# Patient Record
Sex: Male | Born: 1980 | Race: Black or African American | Hispanic: No | Marital: Single | Smoking: Current every day smoker
Health system: Southern US, Community
[De-identification: ages and names within clinical notes are randomized; demographics above are authoritative.]

## PROBLEM LIST (undated history)

## (undated) DIAGNOSIS — F250 Schizoaffective disorder, bipolar type: Secondary | ICD-10-CM

## (undated) DIAGNOSIS — F1210 Cannabis abuse, uncomplicated: Secondary | ICD-10-CM

## (undated) DIAGNOSIS — F200 Paranoid schizophrenia: Secondary | ICD-10-CM

## (undated) DIAGNOSIS — F23 Brief psychotic disorder: Secondary | ICD-10-CM

## (undated) DIAGNOSIS — F602 Antisocial personality disorder: Secondary | ICD-10-CM

---

## 2016-01-31 ENCOUNTER — Emergency Department
Admission: EM | Admit: 2016-01-31 | Discharge: 2016-01-31 | Disposition: A | Discharge status: Home / Self Care | Payer: Medicare Other | Attending: Emergency Medicine | Admitting: Emergency Medicine

## 2016-01-31 ENCOUNTER — Encounter: Payer: Self-pay | Admitting: Emergency Medicine

## 2016-01-31 DIAGNOSIS — Z91199 Patient's noncompliance with other medical treatment and regimen due to unspecified reason: Secondary | ICD-10-CM

## 2016-01-31 DIAGNOSIS — Z9119 Patient's noncompliance with other medical treatment and regimen: Secondary | ICD-10-CM

## 2016-01-31 DIAGNOSIS — Z046 Encounter for general psychiatric examination, requested by authority: Secondary | ICD-10-CM | POA: Diagnosis present

## 2016-01-31 DIAGNOSIS — F200 Paranoid schizophrenia: Secondary | ICD-10-CM

## 2016-01-31 DIAGNOSIS — F209 Schizophrenia, unspecified: Secondary | ICD-10-CM | POA: Diagnosis not present

## 2016-01-31 DIAGNOSIS — F17210 Nicotine dependence, cigarettes, uncomplicated: Secondary | ICD-10-CM | POA: Diagnosis not present

## 2016-01-31 HISTORY — DX: Cannabis abuse, uncomplicated: F12.10

## 2016-01-31 HISTORY — DX: Paranoid schizophrenia: F20.0

## 2016-01-31 HISTORY — DX: Brief psychotic disorder: F23

## 2016-01-31 LAB — COMPREHENSIVE METABOLIC PANEL
ALK PHOS: 90 U/L (ref 38–126)
ALT: 20 U/L (ref 17–63)
ANION GAP: 9 (ref 5–15)
AST: 26 U/L (ref 15–41)
Albumin: 4.5 g/dL (ref 3.5–5.0)
BILIRUBIN TOTAL: 1.3 mg/dL — AB (ref 0.3–1.2)
BUN: 14 mg/dL (ref 6–20)
CALCIUM: 9.6 mg/dL (ref 8.9–10.3)
CO2: 26 mmol/L (ref 22–32)
Chloride: 102 mmol/L (ref 101–111)
Creatinine, Ser: 1.04 mg/dL (ref 0.61–1.24)
GFR calc Af Amer: >60 mL/min (ref >60)
GLUCOSE: 93 mg/dL (ref 65–99)
POTASSIUM: 4 mmol/L (ref 3.5–5.1)
Sodium: 137 mmol/L (ref 135–145)
TOTAL PROTEIN: 7.7 g/dL (ref 6.5–8.1)

## 2016-01-31 LAB — CBC
HEMATOCRIT: 42.5 % (ref 40.0–52.0)
Hemoglobin: 14.4 g/dL (ref 13.0–18.0)
MCH: 30 pg (ref 26.0–34.0)
MCHC: 34 g/dL (ref 32.0–36.0)
MCV: 88.4 fL (ref 80.0–100.0)
PLATELETS: 171 10*3/uL (ref 150–440)
RBC: 4.8 MIL/uL (ref 4.40–5.90)
RDW: 14.2 % (ref 11.5–14.5)
WBC: 3.9 10*3/uL (ref 3.8–10.6)

## 2016-01-31 LAB — ETHANOL

## 2016-01-31 LAB — SALICYLATE LEVEL: Salicylate Lvl: <4 mg/dL (ref 2.8–30.0)

## 2016-01-31 LAB — ACETAMINOPHEN LEVEL: Acetaminophen (Tylenol), Serum: <10 ug/mL — ABNORMAL LOW (ref 10–30)

## 2016-01-31 NOTE — ED Notes (Signed)
Pt awake in hall bed, awaiting group home, at 1930, per Florentina AddisonKatie, [place]

## 2016-01-31 NOTE — ED Notes (Signed)
Guardian for pt called, insisting on pt being held if not then meds to be prescribed to pt to take home, Katie, RN, explained in redundant detail that we have no committment cause, pt is behavioral in nature, and psychiatrist doesn't typically send pts home with med prescriptions  Dictated but not read by Florentina AddisonKatie, RN

## 2016-01-31 NOTE — Consult Note (Signed)
[facility] Face-to-Face Psychiatry Consult   Reason for Consult:  Consult for this 35 year old man brought in by petition Referring Physician:  Lovena Le Patient Identification: David Davila. MRN:  [id number] Principal Diagnosis: Paranoid schizophrenia ([place]) Diagnosis:   Patient Active Problem List   Diagnosis Date Noted  . Paranoid schizophrenia ([place]) [F20.0] 01/31/2016  . Noncompliance [Z91.19] 01/31/2016    Total Time spent with patient: 1 hour  Subjective:   David Ozburn. is a 35 y.o. male patient admitted with "I just got out of [facility]".  HPI:  Patient interviewed. Chart reviewed. Labs and vitals reviewed. Case discussed with ER attending and TTS. 35 year old man with a history of schizophrenia. He was just discharged from [facility] yesterday and was placed in a group home in [place]. Patient reports that when he went to this group home they were not able to meet his needs for a vegan diet. Because of this he felt that he could not stay there and so he walked away. They called 911. Patient denies that he's having any auditory or visual hallucinations. Denies having any suicidal thoughts or wish to die. Denies any thoughts of aggression or violence to anyone else. None of that is alleged in the paperwork either. Patient says that he will gladly eat food as long as it is a vegan food. He admits that in the past he has had a delusion that caused him to starve himself completely but says he is no longer acting under that delusion. He is taking Invega long-acting injectable antipsychotic. Patient denies that he has abused any drugs or alcohol since leaving the hospital. He does however have a legal guardian.  Social history: Patient is from another [place]. It sounds like over the last 12 years he has been almost constantly hospitalized. Has had extended hospitalizations at the state hospital. Does have a legal guardian appointed.  Medical history: Currently  denies any active medical problems.  Substance abuse history: Patient admits to a history of cocaine and marijuana abuse but says he hasn't been abusing any since before his last hospitalization.  Past Psychiatric History: Patient apparently became psychotic in his early 13s and developed a persistent delusion that all food was poisonous. From what he says it sounds like he's had multiple extended hospitalizations because of this. Most recently [facility]. He's had several antipsychotics in the past. Now on long-acting injectable. Patient denies he's ever intentionally tried to kill himself or hurt anyone else. Additionally he has a history of substance abuse with cocaine and marijuana being prominent .  Risk to Self: Is patient at risk for suicide?: No Risk to Others:   Prior Inpatient Therapy:   Prior Outpatient Therapy:    Past Medical History:  Past Medical History  Diagnosis Date  . Paranoid schizophrenia ([place])   . Acute psychosis   . Cannabis abuse     Past   History reviewed. No pertinent past surgical history. Family History: History reviewed. No pertinent family history. Family Psychiatric  History: Patient reports that he does not know anything about his biological family of origin Social History:  History  Alcohol Use No     History  Drug Use No    Social History   Social History  . Marital Status: Single    Spouse Name: N/A  . Number of Children: N/A  . Years of Education: N/A   Social History Main Topics  . Smoking status: Current Every Day  Smoker -- 1.00 packs/day    Types: Cigarettes  . Smokeless tobacco: Never Used  . Alcohol Use: No  . Drug Use: No  . Sexual Activity: Not Asked   Other Topics Concern  . None   Social History Narrative  . None   Additional Social History:    Allergies:  No Known Allergies  Labs:  Results for orders placed or performed during the hospital encounter of 01/31/16 (from the past 48 hour(s))   Comprehensive metabolic panel     Status: Abnormal   Collection Time: 01/31/16  1:30 PM  Result Value Ref Range   Sodium 137 135 - 145 mmol/L   Potassium 4.0 3.5 - 5.1 mmol/L   Chloride 102 101 - 111 mmol/L   CO2 26 22 - 32 mmol/L   Glucose, Bld 93 65 - 99 mg/dL   BUN 14 6 - 20 mg/dL   Creatinine, Ser 1.04 0.61 - 1.24 mg/dL   Calcium 9.6 8.9 - 10.3 mg/dL   Total Protein 7.7 6.5 - 8.1 g/dL   Albumin 4.5 3.5 - 5.0 g/dL   AST 26 15 - 41 U/L   ALT 20 17 - 63 U/L   Alkaline Phosphatase 90 38 - 126 U/L   Total Bilirubin 1.3 (H) 0.3 - 1.2 mg/dL   GFR calc non Af Amer >60 >60 mL/min   GFR calc Af Amer >60 >60 mL/min    Comment: (NOTE) The eGFR has been calculated using the CKD EPI equation. This calculation has not been validated in all clinical situations. eGFR's persistently <60 mL/min signify possible Chronic Kidney Disease.    Anion gap 9 5 - 15  Ethanol     Status: None   Collection Time: 01/31/16  1:30 PM  Result Value Ref Range   Alcohol, Ethyl (B) <5 <5 mg/dL    Comment:        LOWEST DETECTABLE LIMIT FOR SERUM ALCOHOL IS 5 mg/dL FOR MEDICAL PURPOSES ONLY   Salicylate level     Status: None   Collection Time: 01/31/16  1:30 PM  Result Value Ref Range   Salicylate Lvl <5.7 2.8 - 30.0 mg/dL  Acetaminophen level     Status: Abnormal   Collection Time: 01/31/16  1:30 PM  Result Value Ref Range   Acetaminophen (Tylenol), Serum <10 (L) 10 - 30 ug/mL    Comment:        THERAPEUTIC CONCENTRATIONS VARY SIGNIFICANTLY. A RANGE OF 10-30 ug/mL MAY BE AN EFFECTIVE CONCENTRATION FOR MANY PATIENTS. HOWEVER, SOME ARE BEST TREATED AT CONCENTRATIONS OUTSIDE THIS RANGE. ACETAMINOPHEN CONCENTRATIONS >150 ug/mL AT 4 HOURS AFTER INGESTION AND >50 ug/mL AT 12 HOURS AFTER INGESTION ARE OFTEN ASSOCIATED WITH TOXIC REACTIONS.   cbc     Status: None   Collection Time: 01/31/16  1:30 PM  Result Value Ref Range   WBC 3.9 3.8 - 10.6 K/uL   RBC 4.80 4.40 - 5.90 MIL/uL   Hemoglobin  14.4 13.0 - 18.0 g/dL   HCT 42.5 40.0 - 52.0 %   MCV 88.4 80.0 - 100.0 fL   MCH 30.0 26.0 - 34.0 pg   MCHC 34.0 32.0 - 36.0 g/dL   RDW 14.2 11.5 - 14.5 %   Platelets 171 150 - 440 K/uL    No current facility-administered medications for this encounter.   No current outpatient prescriptions on file.    Musculoskeletal: Strength & Muscle Tone: within normal limits Gait & Station: normal Patient leans: N/A  Psychiatric Specialty Exam: Physical Exam  Nursing note and vitals reviewed. Constitutional: He appears well-developed and well-nourished.  HENT:  Head: Normocephalic and atraumatic.  Eyes: Conjunctivae are normal. Pupils are equal, round, and reactive to light.  Neck: Normal range of motion.  Cardiovascular: Normal heart sounds.   Respiratory: Effort normal. No respiratory distress.  GI: Soft.  Musculoskeletal: Normal range of motion.  Neurological: He is alert.  Skin: Skin is warm and dry.  Psychiatric: He has a normal mood and affect. His behavior is normal. Judgment and thought content normal.    Review of Systems  Constitutional: Negative.   HENT: Negative.   Eyes: Negative.   Respiratory: Negative.   Cardiovascular: Negative.   Gastrointestinal: Negative.   Musculoskeletal: Negative.   Skin: Negative.   Neurological: Negative.   Psychiatric/Behavioral: Negative for depression, suicidal ideas, hallucinations, memory loss and substance abuse. The patient is not nervous/anxious and does not have insomnia.     Blood pressure 99/61, pulse 87, temperature 98 F (36.7 C), temperature source Oral, resp. rate 18, height 5' 9" (1.753 m), weight 63.504 kg (140 lb), SpO2 97 %.Body mass index is 20.67 kg/(m^2).  General Appearance: Fairly Groomed  Eye Contact:  Good  Speech:  Clear and Coherent  Volume:  Normal  Mood:  Euthymic  Affect:  Appropriate  Thought Process:  Goal Directed  Orientation:  Full (Time, Place, and Person)  Thought Content:  Logical  Suicidal  Thoughts:  No  Homicidal Thoughts:  No  Memory:  Immediate;   Good Recent;   Fair Remote;   Fair  Judgement:  Fair  Insight:  Fair  Psychomotor Activity:  Normal  Concentration:  Concentration: Fair  Recall:  [employer] of Knowledge:  Fair  Language:  Fair  Akathisia:  No  Handed:  Right  AIMS (if indicated):     Assets:  Communication Skills Desire for Improvement Financial Resources/Insurance Housing Physical Health Social Support  ADL's:  Intact  Cognition:  WNL  Sleep:        Treatment Plan Summary: Plan 35 year old man with schizophrenia. Just recently discharged from the hospital and already walking away from his group home. Currently there is no evidence that he is actively psychotic or dangerous to himself or others. I explained to the patient that because he has a guardian that it is likely that if he continues to walk away from the place he is living they will continue to petition him over and over again and so he should think about finding a different solution. Case reviewed with the ER doctor. IVC discontinued. No medications or prescriptions required.  Disposition: Patient does not meet criteria for psychiatric inpatient admission. Supportive therapy provided about ongoing stressors.  Alethia Berthold, MD 01/31/2016 3:25 PM

## 2016-01-31 NOTE — ED Provider Notes (Addendum)
[facility] Emergency Department Provider Note  ___________________________________________  Time seen: Approximately 1:50 PM  I have reviewed the triage vital signs and the nursing notes.   HISTORY  Chief Complaint Beh Med Eval     HPI David Daviesaul Arauz Jr. is a 35 y.o. male who has history of schizophrenia, but states that he has been taken his InVega injections as he supposed to. Patient states that he has not been psychotic at all. Patient states that he told the group home that he requires a Vegan diet and they have not been feeding him a vegan diet. Patient states that he decided to leave the facility go somewhere where he could have a diet that he can tolerate. Patient says they brought him back and he asked them to make him a vegan diet and again and they gave him beans and greens the room with me and butter and shoulder. Patient states that he cannot tolerate the diet and he again left the facility. Patient was picked up by the police per IVC from the facility.   Past Medical History  Diagnosis Date  . Paranoid schizophrenia ([facility])   . Acute psychosis   . Cannabis abuse     Past    Patient Active Problem List   Diagnosis Date Noted  . Paranoid schizophrenia ([facility]) 01/31/2016  . Noncompliance 01/31/2016    History reviewed. No pertinent past surgical history.  No current outpatient prescriptions on file.  Allergies Review of patient's allergies indicates no known allergies.  History reviewed. No pertinent family history.  Social History Social History  Substance Use Topics  . Smoking status: Current Every Day Smoker -- 1.00 packs/day    Types: Cigarettes  . Smokeless tobacco: Never Used  . Alcohol Use: No    Review of Systems Constitutional: No fever/chills Eyes: No visual changes. ENT: No sore throat. Cardiovascular: Denies chest pain. Respiratory: Denies shortness of breath. Gastrointestinal: No abdominal pain.  No nausea, no  vomiting.  No diarrhea.  No constipation. Genitourinary: Negative for dysuria. Musculoskeletal: Negative for back pain. Skin: Negative for rash. Neurological: Negative for headaches, focal weakness or numbness.  10-point ROS otherwise negative.  ____________________________________________   PHYSICAL EXAM:  VITAL SIGNS: ED Triage Vitals  Enc Vitals Group     BP 01/31/16 1322 99/61 mmHg     Pulse Rate 01/31/16 1322 87     Resp 01/31/16 1322 18     Temp 01/31/16 1322 98 F (36.7 C)     Temp Source 01/31/16 1322 Oral     SpO2 01/31/16 1322 97 %     Weight 01/31/16 1322 140 lb (63.504 kg)     Height 01/31/16 1322 5\' 9"  (1.753 m)     Head Cir --      Peak Flow --      Pain Score --      Pain Loc --      Pain Edu? --      Excl. in GC? --     Constitutional: Alert and oriented. Well appearing and in no acute distress. Eyes: Conjunctivae are normal. PERRL. EOMI. Head: Atraumatic. Nose: No congestion/rhinnorhea. Mouth/Throat: Mucous membranes are moist.  Oropharynx non-erythematous. Neck: No stridor.   Cardiovascular: Normal rate, regular rhythm. Grossly normal heart sounds.  Good peripheral circulation. Respiratory: Normal respiratory effort.  No retractions. Lungs CTAB. Gastrointestinal: Soft and nontender. No distention. No abdominal bruits. No CVA tenderness. Musculoskeletal: No lower extremity tenderness nor edema.  No joint effusions. Neurologic:  Normal  speech and language. No gross focal neurologic deficits are appreciated. No gait instability. Skin:  Skin is warm, dry and intact. No rash noted. Psychiatric: Mood and affect are normal. Speech and behavior are normal.  ____________________________________________   LABS (all labs ordered are listed, but only abnormal results are displayed)  Labs Reviewed  COMPREHENSIVE METABOLIC PANEL - Abnormal; Notable for the following:    Total Bilirubin 1.3 (*)    All other components within normal limits  ACETAMINOPHEN  LEVEL - Abnormal; Notable for the following:    Acetaminophen (Tylenol), Serum <10 (*)    All other components within normal limits  ETHANOL  SALICYLATE LEVEL  CBC  URINE DRUG SCREEN, QUALITATIVE ([facility] ONLY)   ____________________________________________  EKG none  ____________________________________________  RADIOLOGY  none ____________________________________________   PROCEDURES  Procedure(s) performed: None  Critical Care performed: No  ____________________________________________   INITIAL IMPRESSION / ASSESSMENT AND PLAN / ED COURSE  Pertinent labs & imaging results that were available during my care of the patient were reviewed by me and considered in my medical decision making (see chart for details). 3:28 PM Patient is cooperative in the ER. Patient will have consult from TTS and psychiatry for further evaluation secondary to his IVC. Patient will be signed out to Dr. Derrill Kay at 4 PM.  3:28 PM Dr. Mat Carne PACS did not feel this patient required admission, and reversed his IVC papers. Patient is to follow-up with his psychiatrist as an outpatient. ____________________________________________   FINAL CLINICAL IMPRESSION(S) / ED DIAGNOSES  Final diagnoses:  Schizophrenia, unspecified type (HCC)      NEW MEDICATIONS STARTED DURING THIS VISIT:  New Prescriptions   No medications on file     Note:  This document was prepared using Dragon voice recognition software and may include unintentional dictation errors.    Leona Carry, MD 01/31/16 1414  Leona Carry, MD 01/31/16 [phone]

## 2016-01-31 NOTE — ED Notes (Addendum)
POC discussed with pt, pt resolved to go back to group home "I gotta make it work", discussed with first Charity fundraiserN, Insurance claims handlerLisa and charge RN, [employer]  [employer] called

## 2016-01-31 NOTE — ED Notes (Signed)
At 2132 this RN called David Davila and confirmed that pt arrived safely by cab

## 2016-01-31 NOTE — ED Notes (Signed)
Called group home x2; staff member unable to get patient due to being the only staff member. Called Owner x2 and left messages for return calls: David Davila: [phone]

## 2016-01-31 NOTE — ED Notes (Signed)
At 2132 this RN contacted Brock BadLuwanda Ray at group home and confirmed that pt had arrived safely by cab

## 2016-01-31 NOTE — ED Notes (Signed)
Attempted to call group home owner 3 more times; messages left.

## 2016-01-31 NOTE — ED Notes (Signed)
Brock BadLuwanda Ray, [phone], group home owner, asked this RN to call taxi for COD pt to [address], [place].

## 2016-01-31 NOTE — ED Notes (Signed)
Pt [facility] and [facility]. Pt states he decided yesterday he did not to stay there. Pt states he told the caregivers that he is a vegan and was a hindu and then at dinner he was given an orange and peanut butter crackers due to his dietary restrictions. Pt states he got upset and left and [facility] was called and IVC papers taken out on patient. IVC papers state patient walks off from facility.

## 2016-03-10 ENCOUNTER — Encounter (HOSPITAL_COMMUNITY): Payer: Self-pay

## 2016-03-10 ENCOUNTER — Emergency Department (HOSPITAL_COMMUNITY)
Admission: EM | Admit: 2016-03-10 | Discharge: 2016-03-11 | Disposition: A | Discharge status: Home / Self Care | Payer: Medicare Other | Attending: Emergency Medicine | Admitting: Emergency Medicine

## 2016-03-10 DIAGNOSIS — F17210 Nicotine dependence, cigarettes, uncomplicated: Secondary | ICD-10-CM | POA: Insufficient documentation

## 2016-03-10 DIAGNOSIS — Z046 Encounter for general psychiatric examination, requested by authority: Secondary | ICD-10-CM | POA: Diagnosis present

## 2016-03-10 DIAGNOSIS — F22 Delusional disorders: Secondary | ICD-10-CM | POA: Diagnosis not present

## 2016-03-10 DIAGNOSIS — Z79899 Other long term (current) drug therapy: Secondary | ICD-10-CM | POA: Insufficient documentation

## 2016-03-10 LAB — RAPID URINE DRUG SCREEN, HOSP PERFORMED
AMPHETAMINES: NOT DETECTED
BENZODIAZEPINES: POSITIVE — AB
Barbiturates: NOT DETECTED
COCAINE: POSITIVE — AB
Opiates: NOT DETECTED
TETRAHYDROCANNABINOL: POSITIVE — AB

## 2016-03-10 LAB — COMPREHENSIVE METABOLIC PANEL
ALBUMIN: 4 g/dL (ref 3.5–5.0)
ALK PHOS: 70 U/L (ref 38–126)
ALT: 15 U/L — ABNORMAL LOW (ref 17–63)
ANION GAP: 3 — AB (ref 5–15)
AST: 20 U/L (ref 15–41)
BUN: 12 mg/dL (ref 6–20)
CHLORIDE: 108 mmol/L (ref 101–111)
CO2: 26 mmol/L (ref 22–32)
Calcium: 9.2 mg/dL (ref 8.9–10.3)
Creatinine, Ser: 1.07 mg/dL (ref 0.61–1.24)
GFR calc non Af Amer: >60 mL/min (ref >60)
GLUCOSE: 79 mg/dL (ref 65–99)
POTASSIUM: 3.8 mmol/L (ref 3.5–5.1)
SODIUM: 137 mmol/L (ref 135–145)
Total Bilirubin: 1.3 mg/dL — ABNORMAL HIGH (ref 0.3–1.2)
Total Protein: 6.8 g/dL (ref 6.5–8.1)

## 2016-03-10 LAB — SALICYLATE LEVEL

## 2016-03-10 LAB — CBC WITH DIFFERENTIAL/PLATELET
BASOS ABS: 0 10*3/uL (ref 0.0–0.1)
BASOS PCT: 0 %
Eosinophils Absolute: 0.1 10*3/uL (ref 0.0–0.7)
Eosinophils Relative: 1 %
HEMATOCRIT: 36.8 % — AB (ref 39.0–52.0)
Hemoglobin: 12.5 g/dL — ABNORMAL LOW (ref 13.0–17.0)
LYMPHS PCT: 50 %
Lymphs Abs: 2.4 10*3/uL (ref 0.7–4.0)
MCH: 30 pg (ref 26.0–34.0)
MCHC: 34 g/dL (ref 30.0–36.0)
MCV: 88.2 fL (ref 78.0–100.0)
MONO ABS: 0.5 10*3/uL (ref 0.1–1.0)
Monocytes Relative: 10 %
NEUTROS ABS: 1.9 10*3/uL (ref 1.7–7.7)
Neutrophils Relative %: 39 %
Platelets: 180 10*3/uL (ref 150–400)
RBC: 4.17 MIL/uL — AB (ref 4.22–5.81)
RDW: 13.4 % (ref 11.5–15.5)
WBC: 4.9 10*3/uL (ref 4.0–10.5)

## 2016-03-10 LAB — ETHANOL: Alcohol, Ethyl (B): <5 mg/dL (ref <5)

## 2016-03-10 LAB — ACETAMINOPHEN LEVEL

## 2016-03-10 MED ORDER — NICOTINE 21 MG/24HR TD PT24
21.0000 mg | MEDICATED_PATCH | Freq: Every day | TRANSDERMAL | Status: DC
Start: 2016-03-10 — End: 2016-03-11

## 2016-03-10 MED ORDER — ONDANSETRON HCL 4 MG PO TABS: 4.0000 mg | ORAL_TABLET | Freq: Three times a day (TID) | ORAL | Status: DC | PRN

## 2016-03-10 MED ORDER — ZOLPIDEM TARTRATE 5 MG PO TABS: 10.0000 mg | ORAL_TABLET | Freq: Every evening | ORAL | Status: DC | PRN

## 2016-03-10 MED ORDER — IBUPROFEN 400 MG PO TABS: 600.0000 mg | ORAL_TABLET | Freq: Three times a day (TID) | ORAL | Status: DC | PRN

## 2016-03-10 MED ORDER — ALUM & MAG HYDROXIDE-SIMETH 200-200-20 MG/5ML PO SUSP: 30.0000 mL | ORAL | Status: DC | PRN

## 2016-03-10 MED ORDER — LORAZEPAM 1 MG PO TABS: 1.0000 mg | ORAL_TABLET | Freq: Three times a day (TID) | ORAL | Status: DC | PRN

## 2016-03-10 MED ORDER — ACETAMINOPHEN 325 MG PO TABS: 650.0000 mg | ORAL_TABLET | ORAL | Status: DC | PRN

## 2016-03-10 NOTE — [facility] Counselor (Signed)
Attempted to contact Pt for TTS assessment.  Per hospital staff, Pt still refuses to open his eyes although the sitter did observe him shifting earlier.

## 2016-03-10 NOTE — ED Triage Notes (Signed)
Patient here VIA [facility].   Patient states "im choosing not to eat, I know that when I dont eat they will send me to the hospital, so I came up here before they can send me here"   Patient states he ate lunch today (corn, lettuce, and peaches. And has refused food for the rest of the day.  Patient A&OX4 at this time, calm and cooperative at this time.  Patient denies SI/HI and denies AVH.   Per [facility] patient eloped from group home today and has been walking, patient walked to sheriff's office and requested to come to hospital.

## 2016-03-10 NOTE — ED Notes (Addendum)
Pt's nurse arrived to transport pt back to [facility]. Upon arrival into room, pt refusing to go back to facility. Facility has agreed to accept him back. Dr. Hyacinth Meeker notified, as well as [facility]. Per [facility] they will get in touch with social work and notify us ASAP.

## 2016-03-10 NOTE — ED Notes (Signed)
Patient changed into purple scrubs and wanded by security. 

## 2016-03-10 NOTE — ED Notes (Signed)
Pt witnessed moving around on stretcher by sitter. Upon RN entering room pt refuses to move, open eyes or respond to RN. Respirations even and unlabored. Pt refusing to cooperate for TTS assessment at this time.

## 2016-03-10 NOTE — [facility] Counselor (Signed)
Assessed Pt via telepsych machine.  Left message for Archie Balboa, Pt's nurse at [facility] for collateral.  Her number -- [phone].  Left message asking her to contact me at [phone].

## 2016-03-10 NOTE — ED Notes (Signed)
Patient resting in bed at this time, eyes closed. Even rise and fall of chest. NAD at this time

## 2016-03-10 NOTE — ED Notes (Signed)
Per Dennard Nip at [facility], there is nothing their Child psychotherapist can do at this time. Due to pt refusing to go back to facility, it is now a social work case. Recommends pt stays until [facility] social work arrives in the morning. Consulting civil engineer notified.

## 2016-03-10 NOTE — ED Notes (Signed)
[facility] [phone]

## 2016-03-10 NOTE — ED Notes (Signed)
Pt given meal tray. Pt placed tray outside of room.

## 2016-03-10 NOTE — ED Notes (Signed)
Patient went to restroom, changed clothing and gave urine specimen at this time. Patient cooperative and very polite.

## 2016-03-10 NOTE — BH Assessment (Signed)
Tele Assessment Note   David Jesus. is a 35 y.o. male with a history of schizophrenia who presented voluntarily to [facility] after approaching a local sheriff's office and asking to be transported to the hospital.  Pt resides at [facility] (a group home).  Pt initially presented at 0425 on 7/30 but was unable to be roused for assessment.  The following history was collected from Pt and also from his group home nurse Burna Mortimer Philips -- [phone]).  Pt stated that he wanted to come into the hospital because he does not like his group home.  Pt stated "I was raised my whole life eating ... I'm not supposed to eat.  I don't need to eat.  Hunger is a disease."  When asked about eating, Pt stated that he does not want to eat, but he also acknowledged that he finally ate something at the hospital today.  His last meal before that was lunch on 03/09/16.  Pt stated that he spent three years in [place] starting around age 40.  He acknowledged a diagnosis of schizophrenia.  "They say I have a fixed delusion."    Pt tested positive for cocaine and marijuana, and when asked about it, Pt responded, "I use it when I'm bored."  Use started around age 59.  Pt described use as episodic, and then only with small amounts.    Ms. Venia Carbon reported that Pt has lived in the group home for about a month.  He eloped twice this past week and has been in trouble at the group home for bringing marijuana into the facility.  Per Ms. Philips, Pt was admitted to [facility] following his father's death -- "He couldn't accept it," and it was around this time that Pt developed his delusion around not eating.   He stayed at [facility] for three years.  Per Ms. Philips, Pt's guardian is [employer] [place], an organization that helps people with developmental delays.  (Note:  No history of I/DD provided to author; likewise, Pt was lucid and responsive and did not exhibit any obvious developmental delays).    Ms. Venia Carbon said that the group home  will be willing to take Pt back, but that the group home is also looking at discharge due to Pt's continued use of marijuana.  Consulted with L. Earlene Plater, NP, who recommended Pt's discharge from [facility]. He is to return to the group home and provider.  Diagnosis: Schizophrenia  Past Medical History:  Past Medical History:  Diagnosis Date  . Acute psychosis   . Cannabis abuse    Past  . Paranoid schizophrenia (HCC)     History reviewed. No pertinent surgical history.  Family History: History reviewed. No pertinent family history.  Social History:  reports that he has been smoking Cigarettes.  He has been smoking about 1.00 pack per day. He has never used smokeless tobacco. He reports that he uses drugs, including Cocaine and Marijuana. He reports that he does not drink alcohol.  Additional Social History:  Alcohol / Drug Use Pain Medications: See PTA Prescriptions: See PTA Over the Counter: See PTA History of alcohol / drug use?: Yes Substance #1 Name of Substance 1: Cocaine 1 - Age of First Use: [age] 1 - Amount (size/oz): "Just a little bit" 1 - Frequency: Episodic ("When I'm bored") 1 - Duration: Ongoing 1 - Last Use / Amount: 03/09/16 Substance #2 Name of Substance 2: Marijuana 2 - Age of First Use: [age] 2 - Amount (size/oz): "Not much" 2 -  Frequency: Episodic ("When I'm bored") 2 - Duration: Ongoing 2 - Last Use / Amount: 03/09/16  CIWA: CIWA-Ar BP: 114/59 Pulse Rate: 81 COWS:    PATIENT STRENGTHS: (choose at least two) Communication skills General fund of knowledge Physical Health  Allergies: No Known Allergies  Home Medications:  (Not in a hospital admission)  OB/GYN Status:  No LMP for male patient.  General Assessment Data Location of Assessment: [facility] ED TTS Assessment: In system Is this a Tele or Face-to-Face Assessment?: Tele Assessment Is this an Initial Assessment or a Re-assessment for this encounter?: Initial Assessment Marital status: Single Is  patient pregnant?: No Pregnancy Status: No Living Arrangements: Group Home [facility]; contact is [employer] [phone]) Can pt return to current living arrangement?: Yes Admission Status: Voluntary Is patient capable of signing voluntary admission?: Yes Referral Source: Self/Family/Friend Insurance type: Self-pay  Medical Screening Exam [facility] Walk-in ONLY) Medical Exam completed: Yes  Crisis Care Plan Living Arrangements: Group Home [facility]; contact is [place] [phone]) Legal Guardian: Other: ([employer] [place])  Education Status Is patient currently in school?: No  Risk to self with the past 6 months Suicidal Ideation: No Has patient been a risk to self within the past 6 months prior to admission? : No Suicidal Intent: No Has patient had any suicidal intent within the past 6 months prior to admission? : No Is patient at risk for suicide?: No Suicidal Plan?: No Has patient had any suicidal plan within the past 6 months prior to admission? : No Access to Means: No What has been your use of drugs/alcohol within the last 12 months?: Cocaine, Marijuana Previous Attempts/Gestures: No Intentional Self Injurious Behavior: None Family Suicide History: No Recent stressful life event(s): Legal Issues (Drug paraphenalia possession charge just dismissed) Persecutory voices/beliefs?: No Depression: No Substance abuse history and/or treatment for substance abuse?: Yes Suicide prevention information given to non-admitted patients: Not applicable  Risk to Others within the past 6 months Homicidal Ideation: No Does patient have any lifetime risk of violence toward others beyond the six months prior to admission? : No Thoughts of Harm to Others: No Current Homicidal Intent: No Current Homicidal Plan: No Access to Homicidal Means: No History of harm to others?: No Assessment of Violence: None Noted Does patient have access to weapons?: No Criminal Charges  Pending?: No Does patient have a court date: No Is patient on probation?: Unknown  Psychosis Hallucinations: None noted Delusions: Unspecified (Fixed delusion -- eating is bad and he does not need to eat)  Mental Status Report Appearance/Hygiene: In scrubs, Unremarkable Eye Contact: Good Motor Activity: Unremarkable Speech: Unremarkable, Logical/coherent Level of Consciousness: Alert Mood: Euthymic, Pleasant Affect: Appropriate to circumstance Anxiety Level: None Thought Processes: Coherent, Relevant Judgement: Impaired Orientation: Person, Place, Situation, Time Obsessive Compulsive Thoughts/Behaviors: None  Cognitive Functioning Concentration: Normal Memory: Recent Intact, Remote Intact IQ: Average (Note:  Pt's guardian is [employer] [place]) Insight: Fair (Pt has understanding that he has delusion) Impulse Control: Poor Appetite: Poor (See narrative) Sleep: No Change Vegetative Symptoms:  (Not eating -- see narrative)  ADLScreening [facility] Assessment Services) Patient's cognitive ability adequate to safely complete daily activities?: Yes Patient able to express need for assistance with ADLs?: Yes Independently performs ADLs?: Yes (appropriate for developmental age)  Prior Inpatient Therapy Prior Inpatient Therapy: Yes Prior Therapy Facilty/Provider(s): [facility] Reason for Treatment: Delusion/Schizophrenia  Prior Outpatient Therapy Does patient have an ACCT team?: No Does patient have Intensive In-House Services?  : No Does patient have [employer]  services? : No Does patient have P4CC services?: No  ADL Screening (condition at time of admission) Patient's cognitive ability adequate to safely complete daily activities?: Yes Is the patient deaf or have difficulty hearing?: No Does the patient have difficulty seeing, even when wearing glasses/contacts?: No Does the patient have difficulty concentrating, remembering, or making decisions?: No Patient able to express need for  assistance with ADLs?: Yes Does the patient have difficulty dressing or bathing?: No Independently performs ADLs?: Yes (appropriate for developmental age) Does the patient have difficulty walking or climbing stairs?: No Weakness of Legs: None Weakness of Arms/Hands: None  Home Assistive Devices/Equipment Home Assistive Devices/Equipment: None  Therapy Consults (therapy consults require a physician order) PT Evaluation Needed: No OT Evalulation Needed: No SLP Evaluation Needed: No Abuse/Neglect Assessment (Assessment to be complete while patient is alone) Physical Abuse: Denies Verbal Abuse: Denies Sexual Abuse: Denies Exploitation of patient/patient's resources: Denies Self-Neglect: Denies Values / Beliefs Cultural Requests During Hospitalization: None Spiritual Requests During Hospitalization: None Consults Spiritual Care Consult Needed: No Social Work Consult Needed: No Merchant navy officer (For Healthcare) Does patient have an advance directive?: No Would patient like information on creating an advanced directive?: No - patient declined information    Additional Information 1:1 In Past 12 Months?: No CIRT Risk: No Elopement Risk: Yes Does patient have medical clearance?: Yes     Disposition:  Disposition Initial Assessment Completed for this Encounter: Yes Disposition of Patient: Other dispositions Other disposition(s): To current provider, Other (Comment) (Pt to be discharged, returned to group home)  Dorris Fetch Casper Pagliuca 03/10/2016 11:58 AM

## 2016-03-10 NOTE — BH Assessment (Signed)
Attempted tele-assessment. RN attempted to rouse Pt without success. TTS will assess Pt when he is awake.   Harlin Rain Patsy [place], LPC, [facility], [facility] Triage Specialist [phone]

## 2016-03-10 NOTE — ED Notes (Signed)
Patient prefers to be called David Davila

## 2016-03-10 NOTE — ED Notes (Signed)
Attempted TTS at this time. Patient asleep and unable to wake at this time

## 2016-03-10 NOTE — ED Provider Notes (Signed)
[facility]-EM[facility]Y DEPT Provider Note   CSN: [id number] Arrival date & time: 03/10/16  0205  First Provider Contact:  02:40 AM      History   Chief Complaint Chief Complaint  Patient presents with  . V70.1    HPI David Davila. is a 35 y.o. male.  HPI patient states he decided to start back to not eating. He states it is a religious preference. When I ask him what religion that that he has he states it is "my own personal belief". Patient states however he ate lunch today. He states "I'm not going to eat no more". When I ask him if he hears voices telling him not to eat he states he only hears himself telling him to stop eating. He states he doesn't believe in eating things that have been killed, he also states he doesn't believe in chopping down trees. He states he can't stand being at the group home and watching people "eat meat all day long". He denies feeling depressed, he denies suicidal or homicidal ideation. He states he is disappointed because people eat meat and destroy things like the trees. He states he has been living in the [facility] care home in [place] for the past month. He states he is in a group home because he has a delusional thought process. Patient states he has been out of prison since November 2016. He was in prison for 3 years. Prior to that he had been in [place] for 2-1/2-3 years. After he got out of prison he was sent back to [facility] for 2 months.   PCP none Psych unknown  Past Medical History:  Diagnosis Date  . Acute psychosis   . Cannabis abuse    Past  . Paranoid schizophrenia [facility])     Patient Active Problem List   Diagnosis Date Noted  . Paranoid schizophrenia (HCC) 01/31/2016  . Noncompliance 01/31/2016    History reviewed. No pertinent surgical history.     Home Medications    Prior to Admission medications   Medication Sig Start Date End Date Taking? Authorizing Provider  atorvastatin (LIPITOR) 10 MG tablet Take 10 mg by mouth  at bedtime.   Yes Historical Provider, MD  lamoTRIgine (LAMICTAL) 100 MG tablet Take 100 mg by mouth 2 (two) times daily.   Yes Historical Provider, MD  LORazepam (ATIVAN) 0.5 MG tablet Take 0.5 mg by mouth every 6 (six) hours as needed for anxiety.   Yes Historical Provider, MD    Family History History reviewed. No pertinent family history.  Social History Social History  Substance Use Topics  . Smoking status: Current Every Day Smoker    Packs/day: 1.00    Types: Cigarettes  . Smokeless tobacco: Never Used  . Alcohol use No  States he did marijuana and cocaine a couple days ago Lives in a group home   Allergies   Review of patient's allergies indicates no known allergies.   Review of Systems Review of Systems  All other systems reviewed and are negative.    Physical Exam Updated Vital Signs BP 115/76 (BP Location: Right Arm)   Pulse 88   Temp 98 F (36.7 C) (Oral)   Resp 16   Ht 5\' 9"  (1.753 m)   Wt 129 lb 12.8 oz (58.9 kg)   SpO2 97%   BMI 19.17 kg/m   Vital signs normal    Physical Exam  Constitutional: He is oriented to person, place, and time. He appears well-developed and well-nourished.  Non-toxic  appearance. He does not appear ill. No distress.  HENT:  Head: Normocephalic and atraumatic.  Right Ear: External ear normal.  Left Ear: External ear normal.  Nose: Nose normal. No mucosal edema or rhinorrhea.  Mouth/Throat: Oropharynx is clear and moist and mucous membranes are normal. No dental abscesses or uvula swelling.  Eyes: Conjunctivae and EOM are normal. Pupils are equal, round, and reactive to light.  Neck: Normal range of motion and full passive range of motion without pain. Neck supple.  Cardiovascular: Normal rate, regular rhythm and normal heart sounds.  Exam reveals no gallop and no friction rub.   No murmur heard. Pulmonary/Chest: Effort normal and breath sounds normal. No respiratory distress. He has no wheezes. He has no rhonchi. He has  no rales. He exhibits no tenderness and no crepitus.  Abdominal: Soft. Normal appearance and bowel sounds are normal. He exhibits no distension. There is no tenderness. There is no rebound and no guarding.  Musculoskeletal: Normal range of motion. He exhibits no edema or tenderness.  Moves all extremities well.   Neurological: He is alert and oriented to person, place, and time. He has normal strength. No cranial nerve deficit.  Skin: Skin is warm, dry and intact. No rash noted. No erythema. No pallor.  Psychiatric: He has a normal mood and affect. His speech is normal and behavior is normal. His mood appears not anxious.  Nursing note and vitals reviewed.    ED Treatments / Results  Labs (all labs ordered are listed, but only abnormal results are displayed) Results for orders placed or performed during the hospital encounter of 03/10/16  Comprehensive metabolic panel  Result Value Ref Range   Sodium 137 135 - 145 mmol/L   Potassium 3.8 3.5 - 5.1 mmol/L   Chloride 108 101 - 111 mmol/L   CO2 26 22 - 32 mmol/L   Glucose, Bld 79 65 - 99 mg/dL   BUN 12 6 - 20 mg/dL   Creatinine, Ser 1.57 0.61 - 1.24 mg/dL   Calcium 9.2 8.9 - 26.2 mg/dL   Total Protein 6.8 6.5 - 8.1 g/dL   Albumin 4.0 3.5 - 5.0 g/dL   AST 20 15 - 41 U/L   ALT 15 (L) 17 - 63 U/L   Alkaline Phosphatase 70 38 - 126 U/L   Total Bilirubin 1.3 (H) 0.3 - 1.2 mg/dL   GFR calc non Af Amer >60 >60 mL/min   GFR calc Af Amer >60 >60 mL/min   Anion gap 3 (L) 5 - 15  Ethanol  Result Value Ref Range   Alcohol, Ethyl (B) <5 <5 mg/dL  Acetaminophen level  Result Value Ref Range   Acetaminophen (Tylenol), Serum <10 (L) 10 - 30 ug/mL  Salicylate level  Result Value Ref Range   Salicylate Lvl <4.0 2.8 - 30.0 mg/dL  CBC with Differential  Result Value Ref Range   WBC 4.9 4.0 - 10.5 K/uL   RBC 4.17 (L) 4.22 - 5.81 MIL/uL   Hemoglobin 12.5 (L) 13.0 - 17.0 g/dL   HCT 03.5 (L) 59.7 - 41.6 %   MCV 88.2 78.0 - 100.0 fL   MCH 30.0  26.0 - 34.0 pg   MCHC 34.0 30.0 - 36.0 g/dL   RDW 38.4 53.6 - 46.8 %   Platelets 180 150 - 400 K/uL   Neutrophils Relative % 39 %   Neutro Abs 1.9 1.7 - 7.7 K/uL   Lymphocytes Relative 50 %   Lymphs Abs 2.4 0.7 - 4.0 K/uL  Monocytes Relative 10 %   Monocytes Absolute 0.5 0.1 - 1.0 K/uL   Eosinophils Relative 1 %   Eosinophils Absolute 0.1 0.0 - 0.7 K/uL   Basophils Relative 0 %   Basophils Absolute 0.0 0.0 - 0.1 K/uL  Urine rapid drug screen (hosp performed)  Result Value Ref Range   Opiates NONE DETECTED NONE DETECTED   Cocaine POSITIVE (A) NONE DETECTED   Benzodiazepines POSITIVE (A) NONE DETECTED   Amphetamines NONE DETECTED NONE DETECTED   Tetrahydrocannabinol POSITIVE (A) NONE DETECTED   Barbiturates NONE DETECTED NONE DETECTED   Laboratory interpretation all normal except +UDS    EKG  EKG Interpretation None       Radiology No results found.  Procedures Procedures (including critical care time)  Medications Ordered in ED Medications  LORazepam (ATIVAN) tablet 1 mg (not administered)  acetaminophen (TYLENOL) tablet 650 mg (not administered)  ibuprofen (ADVIL,MOTRIN) tablet 600 mg (not administered)  zolpidem (AMBIEN) tablet 10 mg (not administered)  nicotine (NICODERM CQ - dosed in mg/24 hours) patch 21 mg (not administered)  ondansetron (ZOFRAN) tablet 4 mg (not administered)  alum & mag hydroxide-simeth (MAALOX/MYLANTA) 200-200-20 MG/5ML suspension 30 mL (not administered)     Initial Impression / Assessment and Plan / ED Course  I have reviewed the triage vital signs and the nursing notes.  Pertinent labs & imaging results that were available during my care of the patient were reviewed by me and considered in my medical decision making (see chart for details).  Clinical Course   Psychiatric medical clearance testing was done.  TSS consult ordered after his laboratory results returned. Review of his chart shows he had a mental health evaluation  done June 21 at [facility] when his group home did IVC papers on him because he kept leaving because they would not put him on a VEGAN diet.  07:00 AM waiting for TSS evaluation   Final Clinical Impressions(s) / ED Diagnoses   Final diagnoses:  Delusions (HCC)    Disposition pending TSS evaluation   Devoria Albe, MD, Concha Pyo, MD 03/10/16 [phone]

## 2016-03-11 NOTE — ED Notes (Signed)
Patient ate 100% of meal

## 2016-03-11 NOTE — ED Notes (Signed)
Burna Mortimer, RN is here to pick up patient.

## 2016-03-11 NOTE — ED Notes (Signed)
Spoke to [place] again, states she is not sure how long it will be until someone can pick patient up but that she will be here as soon as possible.

## 2016-03-11 NOTE — ED Notes (Signed)
Belongings given back to ptient.

## 2016-03-11 NOTE — ED Notes (Signed)
Spoke to [place], states patients ride should be here before 5PM this evening.

## 2016-03-11 NOTE — ED Notes (Signed)
Pt says he only eats fruit, vegetables and nuts, but not hungry at this time.

## 2016-03-11 NOTE — ED Notes (Signed)
Called [facility] and spoke with Johnny Bridge at [phone]. States she will send someone to pick patient up.

## 2016-03-11 NOTE — ED Notes (Signed)
Pt is requesting to call guardian.  Pt given personal belongings to get telephone call.

## 2016-03-11 NOTE — ED Notes (Signed)
[place] with social work contacted at this time . Dejai Schubach

## 2016-03-11 NOTE — ED Notes (Signed)
Report given to Sharia Reeve, RN

## 2016-03-11 NOTE — ED Notes (Signed)
Ordered pt a vegetarian tray and pt refused.

## 2016-06-22 ENCOUNTER — Emergency Department (HOSPITAL_COMMUNITY)
Admission: EM | Admit: 2016-06-22 | Discharge: 2016-06-24 | Disposition: A | Discharge status: Other Acute Inpatient Hospital | Payer: Medicare Other | Attending: Emergency Medicine | Admitting: Emergency Medicine

## 2016-06-22 ENCOUNTER — Encounter (HOSPITAL_COMMUNITY): Payer: Self-pay | Admitting: Nurse Practitioner

## 2016-06-22 DIAGNOSIS — F17210 Nicotine dependence, cigarettes, uncomplicated: Secondary | ICD-10-CM | POA: Diagnosis not present

## 2016-06-22 DIAGNOSIS — R440 Auditory hallucinations: Secondary | ICD-10-CM | POA: Diagnosis present

## 2016-06-22 DIAGNOSIS — F200 Paranoid schizophrenia: Secondary | ICD-10-CM | POA: Diagnosis not present

## 2016-06-22 DIAGNOSIS — Z79899 Other long term (current) drug therapy: Secondary | ICD-10-CM | POA: Diagnosis not present

## 2016-06-22 LAB — COMPREHENSIVE METABOLIC PANEL
ALT: 22 U/L (ref 17–63)
ANION GAP: 7 (ref 5–15)
AST: 32 U/L (ref 15–41)
Albumin: 4.2 g/dL (ref 3.5–5.0)
Alkaline Phosphatase: 83 U/L (ref 38–126)
BUN: 18 mg/dL (ref 6–20)
CHLORIDE: 106 mmol/L (ref 101–111)
CO2: 29 mmol/L (ref 22–32)
CREATININE: 1.09 mg/dL (ref 0.61–1.24)
Calcium: 9.6 mg/dL (ref 8.9–10.3)
Glucose, Bld: 95 mg/dL (ref 65–99)
POTASSIUM: 4.2 mmol/L (ref 3.5–5.1)
SODIUM: 142 mmol/L (ref 135–145)
Total Bilirubin: 0.8 mg/dL (ref 0.3–1.2)
Total Protein: 7.7 g/dL (ref 6.5–8.1)

## 2016-06-22 LAB — RAPID URINE DRUG SCREEN, HOSP PERFORMED
AMPHETAMINES: NOT DETECTED
Barbiturates: NOT DETECTED
Benzodiazepines: NOT DETECTED
COCAINE: NOT DETECTED
OPIATES: NOT DETECTED
TETRAHYDROCANNABINOL: NOT DETECTED

## 2016-06-22 LAB — CBC
HCT: 37.2 % — ABNORMAL LOW (ref 39.0–52.0)
HEMOGLOBIN: 13.1 g/dL (ref 13.0–17.0)
MCH: 30.8 pg (ref 26.0–34.0)
MCHC: 35.2 g/dL (ref 30.0–36.0)
MCV: 87.3 fL (ref 78.0–100.0)
PLATELETS: 253 10*3/uL (ref 150–400)
RBC: 4.26 MIL/uL (ref 4.22–5.81)
RDW: 13.2 % (ref 11.5–15.5)
WBC: 6.4 10*3/uL (ref 4.0–10.5)

## 2016-06-22 LAB — ETHANOL

## 2016-06-22 LAB — SALICYLATE LEVEL

## 2016-06-22 LAB — ACETAMINOPHEN LEVEL

## 2016-06-22 MED ORDER — STERILE WATER FOR INJECTION IJ SOLN
INTRAMUSCULAR | Status: AC
  Administered 2016-06-22: 1.2 mL
  Filled 2016-06-22: qty 10

## 2016-06-22 MED ORDER — LORAZEPAM 2 MG/ML IJ SOLN
2.0000 mg | Freq: Once | INTRAMUSCULAR | Status: AC
  Administered 2016-06-22: 2 mg via INTRAMUSCULAR
  Filled 2016-06-22: qty 1

## 2016-06-22 MED ORDER — ZIPRASIDONE MESYLATE 20 MG IM SOLR
20.0000 mg | Freq: Once | INTRAMUSCULAR | Status: AC
  Administered 2016-06-22: 20 mg via INTRAMUSCULAR
  Filled 2016-06-22: qty 20

## 2016-06-22 NOTE — ED Notes (Signed)
Pt refused vital signs. Pt stated "Get the fuck out of my room! I didn't ask for your help!"

## 2016-06-22 NOTE — ED Notes (Signed)
Upon rounding on patient during report, patient suddenly jumped out of bed, ran out of his room into the hallways shadow-boxing. Pt ran up and down the hall and then quickly out through the main door. This Financial traderwriter including security officers ran after patient. Pt was stopped by one of the security officers that was headed toward the unit. Pt was escorted back to the unit and into his room. Dr. Rhunette CroftNanavati made aware of situation. MD came to unit to assess the situation. Order given to transfer pt to [facility] as pt is at flight risk. Darl PikesSusan, charge RN made aware of situation.  Darl PikesSusan spoke with MD and  was instrumental in getting patient transfer to unit. Report given to Paulino RilyAshley, rn, who reported she could read patient's chart and retrieve information needed. This Clinical research associatewriter still gave report to rn including recall of recent  incident and asked rn to Lexicographernotify writer of any questions/concerns if needed.

## 2016-06-22 NOTE — ED Triage Notes (Signed)
Pt states he needs to speak to a psychiatrist.  He states that he has been "hearing voices and he is severely mentally ill." Add that's that the voices, form Engineer, agriculturalmilitary personnel are making him suicidal.

## 2016-06-22 NOTE — ED Notes (Signed)
Pt dropped off in lobby by [employer]. Pt is talking to himself and is hard to direct. OD called officers back that dropped him off to stay their 30 minute observation time.

## 2016-06-22 NOTE — ED Notes (Signed)
Pt out of bed, pacing the hallway, erratic behavior, speech , speech pressured, rapid, difficult to understand, appears to be responding to internal stimuli. Sh[place]on NP notified of pt behavior, awaiting orders..[name]

## 2016-06-22 NOTE — ED Provider Notes (Signed)
[facility]-EMERGENCY DEPT Provider Note   CSN: [id number] Arrival date & time: 06/22/16  0104  By signing my name below, I, Linus GalasMaharshi Patel, attest that this documentation has been prepared under the direction and in the presence of TR[employer] AutomotiveKelly Terrell Shimko, PA-C. Electronically Signed: Linus GalasMaharshi Patel, ED Scribe. 06/22/16. 1:53 AM.  History   Chief Complaint Chief Complaint  Patient presents with  . Hallucinations  . Psychiatric Evaluation  . Suicidal   The history is provided by the police. No language interpreter was used.    LEVEL 5 CAVEAT SECONDARY TO PSYCHIATRIC DISORDER  HPI Comments: David Daviesaul Lose Jr. is a 35 y.o. male who presents to the Emergency Department with a PMHx of paranoid schizophrenia and acute psychosis complaining of auditory hallucinationS. Police states that they were called by [facility] to have the pt brought the ED for evaluation. While in their custody, pt has not had any suicidal or homicidal ideation. Pt was unable to provide history due to psychiatric disorder. Patient agitated with tangential speech, unable to be redirected and refusing to answer questions.   Past Medical History:  Diagnosis Date  . Acute psychosis   . Cannabis abuse    Past  . Paranoid schizophrenia [facility](HCC)    Patient Active Problem List   Diagnosis Date Noted  . Paranoid schizophrenia (HCC) 01/31/2016  . Noncompliance 01/31/2016   History reviewed. No pertinent surgical history.  Home Medications    Prior to Admission medications   Medication Sig Start Date End Date Taking? Authorizing Provider  atorvastatin (LIPITOR) 10 MG tablet Take 10 mg by mouth at bedtime.    Historical Provider, MD  cyanocobalamin (,VITAMIN B-12,) 1000 MCG/ML injection Inject 1,000 mcg into the muscle every 30 (thirty) days.    Historical Provider, MD  lamoTRIgine (LAMICTAL) 100 MG tablet Take 100 mg by mouth 2 (two) times daily.    Historical Provider, MD  LORazepam (ATIVAN) 0.5 MG tablet Take 0.5 mg by mouth every  6 (six) hours as needed for anxiety.    Historical Provider, MD   Family History History reviewed. No pertinent family history.  Social History Social History  Substance Use Topics  . Smoking status: Current Every Day Smoker    Packs/day: 1.00    Types: Cigarettes  . Smokeless tobacco: Never Used  . Alcohol use No   Allergies   Patient has no known allergies.  Review of Systems Review of Systems  Unable to perform ROS: Psychiatric disorder    Physical Exam Updated Vital Signs BP 129/72 (BP Location: Left Arm)   Pulse 86   Temp 97.5 F (36.4 C) (Oral)   Resp 20   SpO2 100%   Physical Exam  Constitutional: He is oriented to person, place, and time. He appears well-developed and well-nourished. No distress.  HENT:  Head: Normocephalic and atraumatic.  Eyes: Conjunctivae and EOM are normal. No scleral icterus.  Neck: Normal range of motion.  Pulmonary/Chest: Effort normal. No respiratory distress.  Musculoskeletal: Normal range of motion.  Neurological: He is alert and oriented to person, place, and time. He exhibits normal muscle tone.  Skin: Skin is warm and dry. No rash noted. He is not diaphoretic. No erythema. No pallor.  Psychiatric: His speech is rapid and/or pressured. He is agitated. He expresses no homicidal ideation.  Nursing note and vitals reviewed.   ED Treatments / Results  Labs (all labs ordered are listed, but only abnormal results are displayed) Labs Reviewed  ACETAMINOPHEN LEVEL - Abnormal; Notable for the following:  Result Value   Acetaminophen (Tylenol), Serum <10 (*)    All other components within normal limits  CBC - Abnormal; Notable for the following:    HCT 37.2 (*)    All other components within normal limits  COMPREHENSIVE METABOLIC PANEL  ETHANOL  SALICYLATE LEVEL  RAPID URINE DRUG SCREEN, HOSP PERFORMED   EKG  EKG Interpretation None      Radiology No results found.  Procedures Procedures (including critical care  time)  Medications Ordered in ED Medications - No data to display  Initial Impression / Assessment and Plan / ED Course  I have reviewed the triage vital signs and the nursing notes.  Pertinent labs & imaging results that were available during my care of the patient were reviewed by me and considered in my medical decision making (see chart for details).  Clinical Course     35 year old male with a history of paranoid schizophrenia presents to the emergency department with [employer] for psychiatric evaluation. Patient agitated during my assessment. I was unable to obtain a history from him. Laboratory workup reviewed. The patient has been medically cleared. Patient is pending TTS evaluation for further recommendations. Disposition to be determined by oncoming ED provider.   Final Clinical Impressions(s) / ED Diagnoses   Final diagnoses:  Paranoid schizophrenia (HCC)    New Prescriptions New Prescriptions   No medications on file    I personally performed the services described in this documentation, which was scribed in my presence. The recorded information has been reviewed and is accurate.       Antony MaduraKelly Shriley Joffe, PA-C 06/22/16 [phone]    Dione Boozeavid Glick, MD 06/22/16 [phone]

## 2016-06-22 NOTE — ED Notes (Addendum)
Pt pushed called bell, this nurse in pt room, pt ran out of room yelling, delusional,actively hallucinating, cursing, pacing.

## 2016-06-22 NOTE — BH Assessment (Signed)
Pt is currently too drowsy to participate in assessment. He does state he is hearing voices. TTS will complete assessment when Pt is able to stay awake.   Harlin RainFord Ellis Patsy BaltimoreWarrick Jr, LPC, [facility], [facility] Triage Specialist [phone]

## 2016-06-22 NOTE — ED Notes (Signed)
Pt back on unit. Limited assessment d/t Behavior bizarre, speech pressured, rapid, difficult to comprehend. Pt looking around in hallway "I'm seeing shit." ED charge nurse made aware. Special checks q 15 mins in place for safety. Video monitoring in place.

## 2016-06-22 NOTE — ED Notes (Signed)
Pt up in hallway at double doors shadow boxing, increase agitation, anxious. pt requesting his "god damn shoes and clothes." Shuvon NP made aware.

## 2016-06-22 NOTE — ED Provider Notes (Signed)
Patient attempted to flee from TCU. He is escorted without event to [facility].  Patient was continued rambling almost compensable speech. The pacing. Not violent at this time. Awaiting beta health assessment. Will medicate when necessary following assessment.   Rolland PorterMark Inioluwa Boulay, MD 06/22/16 [phone]

## 2016-06-22 NOTE — ED Notes (Signed)
Nurse will draw labs. 

## 2016-06-22 NOTE — BH Assessment (Addendum)
Assessment Note  David Daviesaul Dehn Jr. is an 35 y.o. male that presents this date brought in by [employer]. Patient is currently under IVC initiated by EDP after patient attempted to leave facility. This writer attempted to assess patient unsuccessfully patient is actively psychotic. Patient is not oriented to time/place and speaks incoherently. Per record review patient [facility] that admission note from EDP was as follows: "HPI patient states he decided to start back to not eating. He states it is a religious preference. When I ask him what religion that that he has he states it is "my own personal belief". Patient states however he ate lunch today. He states "I'm not going to eat no more". When I ask him if he hears voices telling him not to eat he states he only hears himself telling him to stop eating. He states he doesn't believe in eating things that have been killed, he also states he doesn't believe in chopping down trees. He states he can't stand being at the group home and watching people "eat meat all day long". He denies feeling depressed, he denies suicidal or homicidal ideation. He states he is disappointed because people eat meat and destroy things like the trees. He states he has been living in the [facility] in [place] for the past month. He states he is in a group home because he has a delusional thought process. Patient states he has been out of prison since November 2016. He was in prison for 3 years. Prior to that [place] had [place] [facility] [place] [facility]3 years. After he got out of prison [facility]".Per record review patient has a guardian, [employer]. No further information could be obtained. Admission note this date states: "Patient presents to the Emergency Department with a PMHx of paranoid schizophrenia and acute psychosis complaining of auditory hallucinations. Police states that they were called by [facility] to have the pt  brought the ED for evaluation. While in their custody, pt has not had any suicidal or homicidal ideation. Pt was unable to provide history due to psychiatric disorder. Patient agitated with tangential speech, unable to be redirected and refusing to answer questions". Case was staffed with Jonnalagadda MD who recommended an inpatient admission as appropriate bed placement is investigated.  Diagnosis: Acute psychosis (per note review)  Past Medical History:  Past Medical History:  Diagnosis Date  . Acute psychosis   . Cannabis abuse    Past  . Paranoid schizophrenia (HCC)     History reviewed. No pertinent surgical history.  Family History: History reviewed. No pertinent family history.  Social History:  reports that he has been smoking Cigarettes.  He has been smoking about 1.00 pack per day. He has never used smokeless tobacco. He reports that he uses drugs, including Cocaine and Marijuana. He reports that he does not drink alcohol.  Additional Social History:  Alcohol / Drug Use Pain Medications: See PTA Prescriptions: See PTA Over the Counter: See PTA History of alcohol / drug use?: Yes (UTA this date)  CIWA: CIWA-Ar BP: 129/72 Pulse Rate: 86 COWS:    Allergies: No Known Allergies  Home Medications:  (Not in a hospital admission)  OB/GYN Status:  No LMP for male patient.  General Assessment Data Location of Assessment: [facility] ED TTS Assessment: In system Is this a Tele or Face-to-Face Assessment?: Face-to-Face Is this an Initial Assessment or a Re-assessment for this encounter?: Initial  Assessment Marital status: Single Maiden name: na Is patient pregnant?: No Pregnancy Status: No Living Arrangements:  (Unknown) Can pt return to current living arrangement?: Yes Admission Status: Involuntary Is patient capable of signing voluntary admission?: No Referral Source: Other Insurance type: Medicare (per notes)  Medical Screening Exam [facility] Walk-in ONLY) Medical Exam  completed: Yes  Crisis Care Plan Living Arrangements:  (Unknown) Legal Guardian: Other: ([employer] per notes) Name of Psychiatrist: None noted Name of Therapist: None noted  Education Status Is patient currently in school?: No Current Grade:  (UTA) Highest grade of school patient has completed:  (UTA) Name of school:  (UTA) Contact person:  (UTA)  Risk to self with the past 6 months Suicidal Ideation: No (pt denies per notes) Has patient been a risk to self within the past 6 months prior to admission? :  (UTA) Suicidal Intent:  (UTA) Has patient had any suicidal intent within the past 6 months prior to admission? :  (UTA) Is patient at risk for suicide?: Yes Suicidal Plan?:  (UTA) Has patient had any suicidal plan within the past 6 months prior to admission? :  (UTA) Access to Means:  (UTA) What has been your use of drugs/alcohol within the last 12 months?:  (UTA) Previous Attempts/Gestures:  (UTA) How many times?:  (UTA) Other Self Harm Risks:  (UTA) Triggers for Past Attempts:  (UTA) Intentional Self Injurious Behavior:  (UTA) Family Suicide History:  (UTA) Recent stressful life event(s):  (UTA) Persecutory voices/beliefs?:  (UTA) Depression:  (UTA) Depression Symptoms:  (UTA) Substance abuse history and/or treatment for substance abuse?: No Suicide prevention information given to non-admitted patients: Not applicable  Risk to Others within the past 6 months Homicidal Ideation:  (UTA) Does patient have any lifetime risk of violence toward others beyond the six months prior to admission? :  (UTA) Thoughts of Harm to Others:  (UTA) Current Homicidal Intent:  (UTA) Current Homicidal Plan:  (UTA) Access to Homicidal Means:  (UTA) Identified Victim:  (UTA) History of harm to others?:  (UTA) Assessment of Violence:  (UTA) Violent Behavior Description:  (UTA) Does patient have access to weapons?:  (UTA) Criminal Charges Pending?:  (UTA) Does patient have a court date:   (UTA) Is patient on probation?:  (UTA)  Psychosis Hallucinations:  (UTA) Delusions:  (UTA)  Mental Status Report Appearance/Hygiene: In scrubs Eye Contact: Poor Motor Activity: Restlessness Speech: Unable to assess Level of Consciousness: Drowsy Mood:  (UTA) Affect: Unable to Assess Anxiety Level: Minimal Thought Processes: Unable to Assess Judgement: Unable to Assess Orientation: Unable to assess Obsessive Compulsive Thoughts/Behaviors: Unable to Assess  Cognitive Functioning Concentration: Unable to Assess Memory: Unable to Assess IQ:  (UTA) Insight:  (UTA) Impulse Control:  (UTA) Appetite:  (UTA) Weight Loss:  (UTA) Weight Gain:  (UTA) Sleep:  (UTA) Total Hours of Sleep:  (UTA) Vegetative Symptoms:  (UTA)  ADLScreening [facility] Assessment Services) Patient's cognitive ability adequate to safely complete daily activities?: Yes (per previous notes) Patient able to express need for assistance with ADLs?: Yes (per previous notes) Independently performs ADLs?: Yes (appropriate for developmental age) (per previous notes)  Prior Inpatient Therapy Prior Inpatient Therapy: Yes (per history) Prior Therapy Dates: 2017 Prior Therapy Facilty/Provider(s): [facility] Reason for Treatment: MH issues  Prior Outpatient Therapy Prior Outpatient Therapy:  (UTA) Prior Therapy Dates:  Rich Reining) Prior Therapy Facilty/Provider(s):  (UTA) Reason for Treatment:  (UTA) Does patient have an ACCT team?:  (UTA) Does patient have Intensive In-House Services?  :  (UTA) Does patient  have [facility] services? :  (UTA) Does patient have P4CC services?:  (UTA)  ADL Screening (condition at time of admission) Patient's cognitive ability adequate to safely complete daily activities?: Yes (per previous notes) Is the patient deaf or have difficulty hearing?: No (per previous notes) Does the patient have difficulty seeing, even when wearing glasses/contacts?: No (per previous notes) Does the patient have  difficulty concentrating, remembering, or making decisions?: Yes (per previous notes) Patient able to express need for assistance with ADLs?: Yes (per previous notes) Does the patient have difficulty dressing or bathing?: No Independently performs ADLs?: Yes (appropriate for developmental age) (per previous notes) Does the patient have difficulty walking or climbing stairs?: No (per previous notes) Weakness of Legs: None (per previous notes) Weakness of Arms/Hands: None (per previous notes)  Home Assistive Devices/Equipment Home Assistive Devices/Equipment: None (per previous notes)  Therapy Consults (therapy consults require a physician order) PT Evaluation Needed: No OT Evalulation Needed: No SLP Evaluation Needed: No Abuse/Neglect Assessment (Assessment to be complete while patient is alone) Physical Abuse: Denies (per previous notes) Verbal Abuse: Denies (per previous notes) Sexual Abuse: Denies (per previous notes) Exploitation of patient/patient's resources: Denies (per previous notes) Self-Neglect: Denies (per previous notes) Values / Beliefs Cultural Requests During Hospitalization: None Spiritual Requests During Hospitalization: None Consults Spiritual Care Consult Needed: No Social Work Consult Needed: No Merchant navy officerAdvance Directives (For Healthcare) Does patient have an advance directive?: No Would patient like information on creating an advanced directive?: No - patient declined information    Additional Information 1:1 In Past 12 Months?:  (UTA) CIRT Risk: Yes Elopement Risk: Yes Does patient have medical clearance?: Yes     Disposition: Case was staffed with Jonnalagadda MD who recommended an inpatient admission as appropriate bed placement is investigated.  Disposition Initial Assessment Completed for this Encounter: Yes Disposition of Patient: Inpatient treatment program Type of inpatient treatment program: Adult  On Site Evaluation by:   Reviewed with Physician:     Alfredia Fergusonavid L Catia Todorov 06/22/2016 10:56 AM

## 2016-06-22 NOTE — BH Assessment (Signed)
Joann Glover, AC at [facility], confirmed adult unit is at capacity. Faxed clinical information to the following facilities for placement:  [facility]   Aries Kasa Ellis Thaddaeus Granja Jr, LPC, NCC, DCC Triage Specialist [phone]  

## 2016-06-22 NOTE — ED Notes (Signed)
  Pt alert to self hearing auditory voices, Is refusing  at this time to get undressed into the paper scrubs, states the voices are telling him to harm himself, appears to be agitated at the moment, will continue to redirect and monitor.

## 2016-06-22 NOTE — ED Notes (Signed)
Pt laying in bed talking to self. Will continue to monitor.

## 2016-06-23 DIAGNOSIS — F200 Paranoid schizophrenia: Secondary | ICD-10-CM

## 2016-06-23 DIAGNOSIS — F17210 Nicotine dependence, cigarettes, uncomplicated: Secondary | ICD-10-CM

## 2016-06-23 DIAGNOSIS — Z79899 Other long term (current) drug therapy: Secondary | ICD-10-CM | POA: Diagnosis not present

## 2016-06-23 MED ORDER — LORAZEPAM 1 MG PO TABS
2.0000 mg | ORAL_TABLET | Freq: Two times a day (BID) | ORAL | Status: DC
  Administered 2016-06-23 – 2016-06-24 (×2): 2 mg via ORAL
  Filled 2016-06-23 (×3): qty 2

## 2016-06-23 MED ORDER — ZIPRASIDONE MESYLATE 20 MG IM SOLR
20.0000 mg | INTRAMUSCULAR | Status: AC | PRN
  Administered 2016-06-23: 20 mg via INTRAMUSCULAR
  Filled 2016-06-23: qty 20

## 2016-06-23 MED ORDER — HALOPERIDOL 5 MG PO TABS
5.0000 mg | ORAL_TABLET | Freq: Two times a day (BID) | ORAL | Status: DC
  Administered 2016-06-23 – 2016-06-24 (×2): 5 mg via ORAL
  Filled 2016-06-23 (×3): qty 1

## 2016-06-23 MED ORDER — ZIPRASIDONE MESYLATE 20 MG IM SOLR: 20.0000 mg | INTRAMUSCULAR | Status: DC | PRN

## 2016-06-23 MED ORDER — LORAZEPAM 1 MG PO TABS: 1.0000 mg | ORAL_TABLET | ORAL | Status: DC | PRN

## 2016-06-23 MED ORDER — OLANZAPINE 10 MG PO TBDP
10.0000 mg | ORAL_TABLET | Freq: Three times a day (TID) | ORAL | Status: DC | PRN
  Administered 2016-06-23: 10 mg via ORAL
  Filled 2016-06-23: qty 1

## 2016-06-23 MED ORDER — STERILE WATER FOR INJECTION IJ SOLN
INTRAMUSCULAR | Status: AC
  Administered 2016-06-23: 11:00:00
  Filled 2016-06-23: qty 10

## 2016-06-23 MED ORDER — LORAZEPAM 2 MG/ML IJ SOLN
INTRAMUSCULAR | Status: AC
  Administered 2016-06-23: 2 mg via INTRAMUSCULAR
  Filled 2016-06-23: qty 1

## 2016-06-23 MED ORDER — OLANZAPINE 5 MG PO TBDP: 5.0000 mg | ORAL_TABLET | Freq: Three times a day (TID) | ORAL | Status: DC | PRN

## 2016-06-23 MED ORDER — DIPHENHYDRAMINE HCL 50 MG/ML IJ SOLN: 50.0000 mg | Freq: Once | INTRAMUSCULAR | Status: DC

## 2016-06-23 MED ORDER — LORAZEPAM 2 MG/ML IJ SOLN: 2.0000 mg | Freq: Once | INTRAMUSCULAR | Status: DC

## 2016-06-23 MED ORDER — HYDROXYZINE HCL 25 MG PO TABS
25.0000 mg | ORAL_TABLET | Freq: Three times a day (TID) | ORAL | Status: DC
  Administered 2016-06-23 – 2016-06-24 (×2): 25 mg via ORAL
  Filled 2016-06-23 (×3): qty 1

## 2016-06-23 MED ORDER — DIPHENHYDRAMINE HCL 50 MG/ML IJ SOLN
INTRAMUSCULAR | Status: AC
  Administered 2016-06-23: 50 mg via INTRAMUSCULAR
  Filled 2016-06-23: qty 1

## 2016-06-23 NOTE — Consult Note (Signed)
[place] Psychiatry Consult   Reason for Consult:  Psychosis, labile mood Referring Physician:  EDP Patient Identification: David Davila. MRN:  [id number] Principal Diagnosis: Schizophrenia, paranoid ([place]) Diagnosis:   Patient Active Problem List   Diagnosis Date Noted  . Schizophrenia, paranoid ([place]) [F20.0] 01/31/2016    Priority: High  . Noncompliance [Z91.19] 01/31/2016    Total Time spent with patient: 45 minutes  Subjective:   David Urbani. is a 35 y.o. male patient admitted with agitation and psychosis.  HPI:  David Apachito. is an 35 y.o. male with history of Schizophrenia, was admitted to [facility] in June, 2016. Patient is so disorganized, tangential and unable to give a coherent history. Patient is labile, irritable and gets easily agitated.  When I ask him if he hears voices telling him not to eat he states he only hears himself telling him to stop eating. Patient is delusional, psychotic with disorganized behavior. He is talking to himself as if responding to internal stimuli. Patient denies drugs and alcohol abuse.   Past Psychiatric History: Schizophrenia  Risk to Self: Suicidal Ideation: No (pt denies per notes) Suicidal Intent:  (UTA) Is patient at risk for suicide?: Yes Suicidal Plan?:  (UTA) Access to Means:  (UTA) What has been your use of drugs/alcohol within the last 12 months?:  (UTA) How many times?:  (UTA) Other Self Harm Risks:  (UTA) Triggers for Past Attempts:  (UTA) Intentional Self Injurious Behavior:  (UTA) Risk to Others: Homicidal Ideation:  (UTA) Thoughts of Harm to Others:  (UTA) Current Homicidal Intent:  (UTA) Current Homicidal Plan:  (UTA) Access to Homicidal Means:  (UTA) Identified Victim:  (UTA) History of harm to others?:  (UTA) Assessment of Violence:  (UTA) Violent Behavior Description:  (UTA) Does patient have access to weapons?:  (UTA) Criminal Charges Pending?:  (UTA) Does patient have a court date:  (UTA) Prior  Inpatient Therapy: Prior Inpatient Therapy: Yes (per history) Prior Therapy Dates: 2017 Prior Therapy Facilty/Provider(s): [facility] Reason for Treatment: MH issues Prior Outpatient Therapy: Prior Outpatient Therapy:  (UTA) Prior Therapy Dates:  (UTA) Prior Therapy Facilty/Provider(s):  (UTA) Reason for Treatment:  (UTA) Does patient have an ACCT team?:  (UTA) Does patient have Intensive In-House Services?  :  (UTA) Does patient have [facility] services? :  (UTA) Does patient have P4[facility] services?:  (UTA)  Past Medical History:  Past Medical History:  Diagnosis Date  . Acute psychosis   . Cannabis abuse    Past  . Paranoid schizophrenia ([place])    History reviewed. No pertinent surgical history. Family History: History reviewed. No pertinent family history. Family Psychiatric  History:  Social History:  History  Alcohol Use No     History  Drug Use  . Types: Cocaine, Marijuana    Social History   Social History  . Marital status: Single    Spouse name: N/A  . Number of children: N/A  . Years of education: N/A   Social History Main Topics  . Smoking status: Current Every Day Smoker    Packs/day: 1.00    Types: Cigarettes  . Smokeless tobacco: Never Used  . Alcohol use No  . Drug use:     Types: Cocaine, Marijuana  . Sexual activity: Not Asked   Other Topics Concern  . None   Social History Narrative  . None   Additional Social History:    Allergies:  No Known Allergies  Labs:  Results for orders placed or performed during the hospital  encounter of 06/22/16 (from the past 48 hour(s))  Comprehensive metabolic panel     Status: None   Collection Time: 06/22/16  2:06 AM  Result Value Ref Range   Sodium 142 135 - 145 mmol/L   Potassium 4.2 3.5 - 5.1 mmol/L   Chloride 106 101 - 111 mmol/L   CO2 29 22 - 32 mmol/L   Glucose, Bld 95 65 - 99 mg/dL   BUN 18 6 - 20 mg/dL   Creatinine, Ser 1.09 0.61 - 1.24 mg/dL   Calcium 9.6 8.9 - 10.3 mg/dL   Total Protein 7.7 6.5 -  8.1 g/dL   Albumin 4.2 3.5 - 5.0 g/dL   AST 32 15 - 41 U/L   ALT 22 17 - 63 U/L   Alkaline Phosphatase 83 38 - 126 U/L   Total Bilirubin 0.8 0.3 - 1.2 mg/dL   GFR calc non Af Amer >60 >60 mL/min   GFR calc Af Amer >60 >60 mL/min    Comment: (NOTE) The eGFR has been calculated using the CKD EPI equation. This calculation has not been validated in all clinical situations. eGFR's persistently <60 mL/min signify possible Chronic Kidney Disease.    Anion gap 7 5 - 15  Ethanol     Status: None   Collection Time: 06/22/16  2:06 AM  Result Value Ref Range   Alcohol, Ethyl (B) <5 <5 mg/dL    Comment:        LOWEST DETECTABLE LIMIT FOR SERUM ALCOHOL IS 5 mg/dL FOR MEDICAL PURPOSES ONLY   Salicylate level     Status: None   Collection Time: 06/22/16  2:06 AM  Result Value Ref Range   Salicylate Lvl <9.6 2.8 - 30.0 mg/dL  Acetaminophen level     Status: Abnormal   Collection Time: 06/22/16  2:06 AM  Result Value Ref Range   Acetaminophen (Tylenol), Serum <10 (L) 10 - 30 ug/mL    Comment:        THERAPEUTIC CONCENTRATIONS VARY SIGNIFICANTLY. A RANGE OF 10-30 ug/mL MAY BE AN EFFECTIVE CONCENTRATION FOR MANY PATIENTS. HOWEVER, SOME ARE BEST TREATED AT CONCENTRATIONS OUTSIDE THIS RANGE. ACETAMINOPHEN CONCENTRATIONS >150 ug/mL AT 4 HOURS AFTER INGESTION AND >50 ug/mL AT 12 HOURS AFTER INGESTION ARE OFTEN ASSOCIATED WITH TOXIC REACTIONS.   cbc     Status: Abnormal   Collection Time: 06/22/16  2:06 AM  Result Value Ref Range   WBC 6.4 4.0 - 10.5 K/uL   RBC 4.26 4.22 - 5.81 MIL/uL   Hemoglobin 13.1 13.0 - 17.0 g/dL   HCT 37.2 (L) 39.0 - 52.0 %   MCV 87.3 78.0 - 100.0 fL   MCH 30.8 26.0 - 34.0 pg   MCHC 35.2 30.0 - 36.0 g/dL   RDW 13.2 11.5 - 15.5 %   Platelets 253 150 - 400 K/uL  Rapid urine drug screen (hospital performed)     Status: None   Collection Time: 06/22/16  2:10 AM  Result Value Ref Range   Opiates NONE DETECTED NONE DETECTED   Cocaine NONE DETECTED NONE  DETECTED   Benzodiazepines NONE DETECTED NONE DETECTED   Amphetamines NONE DETECTED NONE DETECTED   Tetrahydrocannabinol NONE DETECTED NONE DETECTED   Barbiturates NONE DETECTED NONE DETECTED    Comment:        DRUG SCREEN FOR MEDICAL PURPOSES ONLY.  IF CONFIRMATION IS NEEDED FOR ANY PURPOSE, NOTIFY LAB WITHIN 5 DAYS.        LOWEST DETECTABLE LIMITS FOR URINE DRUG SCREEN Drug Class  Cutoff (ng/mL) Amphetamine      1000 Barbiturate      200 Benzodiazepine   144 Tricyclics       315 Opiates          300 Cocaine          300 THC              50     Current Facility-Administered Medications  Medication Dose Route Frequency Provider Last Rate Last Dose  . diphenhydrAMINE (BENADRYL) injection 50 mg  50 mg Intramuscular Once Yalena Colon, MD      . haloperidol (HALDOL) tablet 5 mg  5 mg Oral BID Corena Pilgrim, MD      . hydrOXYzine (ATARAX/VISTARIL) tablet 25 mg  25 mg Oral TID Corena Pilgrim, MD      . LORazepam (ATIVAN) injection 2 mg  2 mg Intramuscular Once Sariyah Corcino, MD      . OLANZapine zydis (ZYPREXA) disintegrating tablet 10 mg  10 mg Oral Q8H PRN Corena Pilgrim, MD       And  . LORazepam (ATIVAN) tablet 1 mg  1 mg Oral PRN Alyaan Budzynski, MD      . LORazepam (ATIVAN) tablet 2 mg  2 mg Oral BID Corena Pilgrim, MD       Current Outpatient Prescriptions  Medication Sig Dispense Refill  . atorvastatin (LIPITOR) 10 MG tablet Take 10 mg by mouth at bedtime.    . cyanocobalamin (,VITAMIN B-12,) 1000 MCG/ML injection Inject 1,000 mcg into the muscle every 30 (thirty) days.    [name] lamoTRIgine (LAMICTAL) 100 MG tablet Take 100 mg by mouth 2 (two) times daily.    [name] LORazepam (ATIVAN) 0.5 MG tablet Take 0.5 mg by mouth every 6 (six) hours as needed for anxiety.      Musculoskeletal: Strength & Muscle Tone: within normal limits Gait & Station: normal Patient leans: N/A  Psychiatric Specialty Exam: Physical Exam  Psychiatric: His affect is angry and labile. His  speech is rapid and/or pressured. He is agitated, aggressive and actively hallucinating. Thought content is paranoid and delusional. Cognition and memory are normal. He expresses impulsivity.    Review of Systems  Constitutional: Negative.   HENT: Negative.   Eyes: Negative.   Respiratory: Negative.   Cardiovascular: Negative.   Gastrointestinal: Negative.   Genitourinary: Negative.   Musculoskeletal: Negative.   Skin: Negative.   Neurological: Negative.   Endo/Heme/Allergies: Negative.   Psychiatric/Behavioral: Positive for hallucinations. The patient is nervous/anxious and has insomnia.     Blood pressure 129/72, pulse 86, temperature 97.5 F (36.4 C), temperature source Oral, resp. rate 20, SpO2 100 %.There is no height or weight on file to calculate BMI.  General Appearance: Casual  Eye Contact:  Minimal  Speech:  Pressured  Volume:  Increased  Mood:  Irritable  Affect:  Labile  Thought Process:  Disorganized  Orientation:  Full (Time, Place, and Person)  Thought Content:  Illogical, Delusions and Hallucinations: Auditory  Suicidal Thoughts:  unable to assess  Homicidal Thoughts:  unable to assess  Memory:  Immediate;   Fair Recent;   Fair Remote;   Good  Judgement:  Impaired  Insight:  Lacking  Psychomotor Activity:  Increased and Restlessness  Concentration:  Concentration: Fair and Attention Span: Fair  Recall:  [employer] of Knowledge:  Fair  Language:  Good  Akathisia:  No  Handed:  Right  AIMS (if indicated):     Assets:  Communication Skills  ADL's:  Intact  Cognition:  WNL  Sleep:   poor     Treatment Plan Summary: Daily contact with patient to assess and evaluate symptoms and progress in treatment, Medication management. Start Haldol 5 mg bid for psychosis. Start Hydroxyzine 25 mg tid for anxiety/agitation Lorazepam 52m TID for agitation  Disposition: Recommend psychiatric Inpatient admission when medically cleared. Supportive therapy provided  about ongoing stressors.  ACorena Pilgrim MD 06/23/2016 2:50 PM

## 2016-06-23 NOTE — ED Notes (Signed)
Pt refused morning vitals, attending RN notified. When writer asked to take his BP he replied "I ain't got no BP" then stated "no you can't take my BP"

## 2016-06-23 NOTE — ED Notes (Signed)
Pt continues to sleep. Pt appears to be resting comfortably with his eyes closed, respirations even and normal. Pt vitals not taken; RN notified.

## 2016-06-23 NOTE — ED Notes (Signed)
Pt continues to press nurses call bell and not need assistance. When this writer asked what pt needed, pt replied "Whats that ringing for? Whats that ringing for? Whats that ringing for?" Pt was offered assistance but pt declined needing anything.
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
## 2016-06-23 NOTE — ED Notes (Signed)
Pt sleeping during vitals time. Did not wake pt during this time. Pt appears to be resting, eyes closed, even respirations. Pt received medication for aggression previously. Pt would benefit from rest. RN Notified.

## 2016-06-23 NOTE — BH Assessment (Signed)
[facility]    06/23/16: Patient continues to meet criteria for inpatient treatment as appropriate bed placement is investigated.
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
## 2016-06-23 NOTE — ED Notes (Addendum)
SBAR Report received from previous nurse. Pt received calm and visible on unit. Pt gave no meaningful answers to questions related to SI/ HI, A/V H, depression, anxiety, and pain at this time, but appearedotherwise stable. Speech imprecise, rambling,   Pt reminded of camera surveillance, q 15 min rounds, and rules of the milieu. Will continue to assess. Pt refused EKG at this time.

## 2016-06-23 NOTE — ED Notes (Signed)
Pt laying in bed, appears to be sleeping, eyes closed, respirations equal, breathing non labored. Will continue to monitor on special checks q 15 mins for safety.

## 2016-06-24 ENCOUNTER — Encounter (HOSPITAL_COMMUNITY): Payer: Self-pay | Admitting: *Deleted

## 2016-06-24 ENCOUNTER — Inpatient Hospital Stay (HOSPITAL_COMMUNITY)
Admission: AD | Admit: 2016-06-24 | Discharge: 2016-07-17 | DRG: 885 | Disposition: A | Discharge status: Home / Self Care | Payer: Medicare Other | Attending: Psychiatry | Admitting: Psychiatry

## 2016-06-24 DIAGNOSIS — Z111 Encounter for screening for respiratory tuberculosis: Secondary | ICD-10-CM

## 2016-06-24 DIAGNOSIS — Z9114 Patient's other noncompliance with medication regimen: Secondary | ICD-10-CM

## 2016-06-24 DIAGNOSIS — F200 Paranoid schizophrenia: Principal | ICD-10-CM | POA: Diagnosis present

## 2016-06-24 DIAGNOSIS — F1410 Cocaine abuse, uncomplicated: Secondary | ICD-10-CM | POA: Clinically undetermined

## 2016-06-24 DIAGNOSIS — Z590 Homelessness: Secondary | ICD-10-CM | POA: Diagnosis not present

## 2016-06-24 DIAGNOSIS — Z87820 Personal history of traumatic brain injury: Secondary | ICD-10-CM

## 2016-06-24 DIAGNOSIS — F1210 Cannabis abuse, uncomplicated: Secondary | ICD-10-CM | POA: Diagnosis present

## 2016-06-24 DIAGNOSIS — Z79899 Other long term (current) drug therapy: Secondary | ICD-10-CM | POA: Diagnosis not present

## 2016-06-24 DIAGNOSIS — F17210 Nicotine dependence, cigarettes, uncomplicated: Secondary | ICD-10-CM | POA: Diagnosis present

## 2016-06-24 DIAGNOSIS — E538 Deficiency of other specified B group vitamins: Secondary | ICD-10-CM | POA: Diagnosis present

## 2016-06-24 DIAGNOSIS — Z91148 Patient's other noncompliance with medication regimen for other reason: Secondary | ICD-10-CM

## 2016-06-24 DIAGNOSIS — F940 Selective mutism: Secondary | ICD-10-CM | POA: Diagnosis present

## 2016-06-24 DIAGNOSIS — F419 Anxiety disorder, unspecified: Secondary | ICD-10-CM | POA: Diagnosis present

## 2016-06-24 MED ORDER — ALUM & MAG HYDROXIDE-SIMETH 200-200-20 MG/5ML PO SUSP: 30.0000 mL | ORAL | Status: DC | PRN

## 2016-06-24 MED ORDER — OLANZAPINE 10 MG PO TABS: 10.0000 mg | ORAL_TABLET | Freq: Three times a day (TID) | ORAL | Status: DC | PRN

## 2016-06-24 MED ORDER — LORAZEPAM 2 MG/ML IJ SOLN
1.0000 mg | Freq: Four times a day (QID) | INTRAMUSCULAR | Status: DC | PRN
Start: 2016-06-24 — End: 2016-07-17
  Filled 2016-06-24: qty 1

## 2016-06-24 MED ORDER — MAGNESIUM HYDROXIDE 400 MG/5ML PO SUSP: 30.0000 mL | Freq: Every day | ORAL | Status: DC | PRN

## 2016-06-24 MED ORDER — ACETAMINOPHEN 325 MG PO TABS: 650.0000 mg | ORAL_TABLET | Freq: Four times a day (QID) | ORAL | Status: DC | PRN

## 2016-06-24 MED ORDER — HALOPERIDOL 5 MG PO TABS
5.0000 mg | ORAL_TABLET | Freq: Two times a day (BID) | ORAL | Status: DC
  Administered 2016-06-24 – 2016-06-25 (×2): 5 mg via ORAL
  Filled 2016-06-24 (×4): qty 1

## 2016-06-24 MED ORDER — TRAZODONE HCL 50 MG PO TABS
50.0000 mg | ORAL_TABLET | Freq: Every evening | ORAL | Status: DC | PRN
  Filled 2016-06-24 (×6): qty 1

## 2016-06-24 MED ORDER — LORAZEPAM 1 MG PO TABS
1.0000 mg | ORAL_TABLET | Freq: Four times a day (QID) | ORAL | Status: DC | PRN
  Administered 2016-06-27 – 2016-07-13 (×5): 1 mg via ORAL
  Filled 2016-06-24 (×7): qty 1

## 2016-06-24 MED ORDER — HYDROXYZINE HCL 25 MG PO TABS
25.0000 mg | ORAL_TABLET | Freq: Three times a day (TID) | ORAL | Status: DC
  Administered 2016-06-24 – 2016-07-17 (×62): 25 mg via ORAL
  Filled 2016-06-24 (×76): qty 1

## 2016-06-24 MED ORDER — BENZTROPINE MESYLATE 0.5 MG PO TABS
0.5000 mg | ORAL_TABLET | Freq: Two times a day (BID) | ORAL | Status: DC
  Administered 2016-06-24 – 2016-06-26 (×4): 0.5 mg via ORAL
  Filled 2016-06-24 (×7): qty 1

## 2016-06-24 MED ORDER — LORAZEPAM 1 MG PO TABS: 2.0000 mg | ORAL_TABLET | Freq: Two times a day (BID) | ORAL | Status: DC

## 2016-06-24 MED ORDER — OLANZAPINE 10 MG IM SOLR: 10.0000 mg | Freq: Three times a day (TID) | INTRAMUSCULAR | Status: DC | PRN

## 2016-06-24 NOTE — Progress Notes (Signed)
06/24/16 1345:  LRT was informed pt was sleep and not to be woken up per staff.  Caroll RancherMarjette Kaylyn Garrow, LRT/CTRS
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
## 2016-06-24 NOTE — Progress Notes (Signed)
Patient ID: David Daviesaul Train Jr., male   DOB: 06/23/1981, 35 y.o.   MRN: [id number] PER STATE REGULATIONS 482.30  THIS CHART WAS REVIEWED FOR MEDICAL NECESSITY WITH RESPECT TO THE PATIENT'S ADMISSION/DURATION OF STAY.  NEXT REVIEW DATE:06/28/16  [name], RN, BSN CASE MANAGER

## 2016-06-24 NOTE — Progress Notes (Signed)
Did not attend group 
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
## 2016-06-24 NOTE — BH Assessment (Signed)
Reassessment: Patient was reassessed this am. Writer met with patient face to face. Patient was calm and cooperative; looking at [employer]. Writer tried to ask several questions regarding his admission. Patient was however preoccupied with a magazine. Patient pointing to a magazine picture and describing the contents of the picture. Writer again attempted to ask patient questions and he did not acknowledge or answer any questions. Patient responding to internal stimuli as he would mumble to himself off/on. Writer unable to confirm/deny suicidal thoughts and homicidal thoughts. Patient is unclear why he is here today. Dr. Darleene Cleaver and Waylan Boga, DNP continue to recommend INPT placement.

## 2016-06-24 NOTE — ED Notes (Signed)
[facility]. In the [facility] today he continued to be disorganized and a little paranoid. He was overall cooperataive and spent the morning sleeping. He ate lunch and then [employer] arrived to toke him to [employer]. All belongings returned to pt, but he was not able to sign for them because of being disorganized and difficult to direct to a task.

## 2016-06-25 DIAGNOSIS — F1210 Cannabis abuse, uncomplicated: Secondary | ICD-10-CM | POA: Clinically undetermined

## 2016-06-25 DIAGNOSIS — F1410 Cocaine abuse, uncomplicated: Secondary | ICD-10-CM | POA: Clinically undetermined

## 2016-06-25 DIAGNOSIS — Z9114 Patient's other noncompliance with medication regimen: Secondary | ICD-10-CM

## 2016-06-25 MED ORDER — HALOPERIDOL 5 MG PO TABS
10.0000 mg | ORAL_TABLET | Freq: Two times a day (BID) | ORAL | Status: DC
  Administered 2016-06-25 – 2016-06-26 (×2): 10 mg via ORAL
  Filled 2016-06-25 (×5): qty 2

## 2016-06-25 MED ORDER — LAMOTRIGINE 25 MG PO TABS
25.0000 mg | ORAL_TABLET | Freq: Every day | ORAL | Status: DC
  Administered 2016-06-25 – 2016-06-27 (×3): 25 mg via ORAL
  Filled 2016-06-25 (×5): qty 1

## 2016-06-25 NOTE — H&P (Signed)
Psychiatric Admission Assessment Adult  Patient Identification: David Davila. MRN:  [id number] Date of Evaluation:  06/25/2016 Chief Complaint: Patient states " I want my real doctor.'  Principal Diagnosis: Paranoid schizophrenia (HCC) Diagnosis:   Patient Active Problem List   Diagnosis Date Noted  . Noncompliance with medication regimen [Z91.14] 06/25/2016  . Cocaine use disorder, mild, abuse [F14.10] 06/25/2016  . Cannabis use disorder, mild, abuse [F12.10] 06/25/2016  . Paranoid schizophrenia (HCC) [F20.0] 06/24/2016      History of Present Illness: David Davila. is a 35 y.o. AA male, who is single, on SSD , who has a hx of schizophrenia and noncompliance with medications , used to live at a [facility] in [place] , was [facility] to [facility] from a night club for disorganized behavior .   Per initial notes in EHR : Patient is currently under IVC initiated by EDP after patient attempted to leave facility. This writer attempted to assess patient unsuccessfully patient is actively psychotic. Patient is not oriented to time/place and speaks incoherently. "    Patient seen and chart reviewed TODAY .Discussed patient with treatment team. Pt today is psychotic , uncooperative - was seen as standing in his bathroom , when writer attempted to evaluate patient - requested him to come in to his room , Pt initially reported that he wants his real doctor. And refused to speak to Clinical research associate. Later on patient became more irritable and demanded that writer leave his room at once.  Patient being acutely psychotic and disorganized - majority of the information was obtained from EHR as well as collateral information obtained by LCSW from patient's legal guardian David Davila with [employer] [place]. Per collateral information obtained - patient has a hx of schizophrenia. Pt was admitted to [facility] in June 2017, after release from there was send to a [facility] at [place]. However pt at that time did not  do well at the [facility] and walked away from there - went to a relative house in [place] . However since pt was so disorganized then , they did not take him and later on was found by his guardian . However , pt went missing until he showed up at this admission. As per guardian pt is noncompliant on his medications. He has a mother in [place] who cannot take care of him .   Per EHR - pt was last admitted to  [facility] on 03/10/16 - at that time was delusional, paranoid , had stopped eating his meals, thought he was vegan , could not tolerate [facility] peers eating meat   He had stated that  he had been living in the [facility] in [place] for the past month. Patient also reported a hx of being in prison , released - November 2016. He was in prison for 3 years. Prior to that he had been in [place] for 2-1/2-3 years. After he got out of prison he was sent back to [facility] for 2 months."      Associated Signs/Symptoms: Depression Symptoms:does not respond (Hypo) Manic Symptoms:  Appears irritable, labile Anxiety Symptoms:  Appears anxious Psychotic Symptoms:  Is delusional , paranoid PTSD Symptoms:does not respond  Total Time spent with patient: 45 minutes  Past Psychiatric History: Pt with hx of schizophrenia , noncompliant on medications - has had several admissions in the past - please see above H&p. Pt is a poor historian and hx is limited.  Is the patient at risk to self?  Yes.    Has the patient been a risk to self in the past 6 months? Yes.    Has the patient been a risk to self within the distant past? Yes.    Is the patient a risk to others? Yes.    Has the patient been a risk to others in the past 6 months? Yes.    Has the patient been a risk to others within the distant past? Yes.     Prior Inpatient Therapy:   Prior Outpatient Therapy:    Alcohol Screening: 1. How often do you have a drink containing alcohol?: Never 9. Have you or someone else been injured as a result of your  drinking?: No 10. Has a relative or friend or a doctor or another health worker been concerned about your drinking or suggested you cut down?: No Alcohol Use Disorder Identification Test Final Score (AUDIT): 0 Brief Intervention: AUDIT score less than 7 or less-screening does not suggest unhealthy drinking-brief intervention not indicated Substance Abuse History in the last 12 months:  Yes.   uds positive for cocaine, THC, BZD on 03/10/16- pt is unable to provide hx. Consequences of Substance Abuse: NA Previous Psychotropic Medications: Yes - unknown Psychological Evaluations: No  Past Medical History:  Past Medical History:  Diagnosis Date  . Acute psychosis   . Cannabis abuse    Past  . Paranoid schizophrenia (HCC)    History reviewed. No pertinent surgical history. Family History:unknown Family Psychiatric  History: unknown Tobacco Screening: Have you used any form of tobacco in the last 30 days? (Cigarettes, Smokeless Tobacco, Cigars, and/or Pipes): No Social History: patient has [employer] [facility] as legal guardian, is currently homeless, ran away from [facility].Has been in prison. History  Alcohol Use No     History  Drug Use  . Types: Cocaine, Marijuana    Additional Social History:                           Allergies:  No Known Allergies Lab Results: No results found for this or any previous visit (from the past 48 hour(s)).  Blood Alcohol level:  Lab Results  Component Value Date   ETH <5 06/22/2016   ETH <5 03/10/2016    Metabolic Disorder Labs:  No results found for: HGBA1C, MPG No results found for: PROLACTIN No results found for: CHOL, TRIG, HDL, CHOLHDL, VLDL, LDLCALC  Current Medications: Current Facility-Administered Medications  Medication Dose Route Frequency Provider Last Rate Last Dose  . acetaminophen (TYLENOL) tablet 650 mg  650 mg Oral Q6H PRN Charm Rings, NP      . alum & mag hydroxide-simeth (MAALOX/MYLANTA) 200-200-20 MG/5ML suspension 30 mL   30 mL Oral Q4H PRN Charm Rings, NP      . benztropine (COGENTIN) tablet 0.5 mg  0.5 mg Oral BID Jomarie Longs, MD   0.5 mg at 06/25/16 0808  . haloperidol (HALDOL) tablet 10 mg  10 mg Oral BID Jomarie Longs, MD      . hydrOXYzine (ATARAX/VISTARIL) tablet 25 mg  25 mg Oral TID Charm Rings, NP   25 mg at 06/25/16 1119  . lamoTRIgine (LAMICTAL) tablet 25 mg  25 mg Oral Daily Jomarie Longs, MD   25 mg at 06/25/16 1112  . LORazepam (ATIVAN) tablet 1 mg  1 mg Oral Q6H PRN Jomarie Longs, MD       Or  . LORazepam (ATIVAN) injection 1 mg  1  mg Intramuscular Q6H PRN Jomarie LongsSaramma Vershawn Westrup, MD      . magnesium hydroxide (MILK OF MAGNESIA) suspension 30 mL  30 mL Oral Daily PRN Charm RingsJamison Y Lord, NP      . OLANZapine (ZYPREXA) tablet 10 mg  10 mg Oral TID PRN Jomarie LongsSaramma Deerica Waszak, MD       Or  . OLANZapine (ZYPREXA) injection 10 mg  10 mg Intramuscular TID PRN Jomarie LongsSaramma Don Giarrusso, MD      . traZODone (DESYREL) tablet 50 mg  50 mg Oral QHS,MR X 1 Jermine Bibbee, MD       PTA Medications: Prescriptions Prior to Admission  Medication Sig Dispense Refill Last Dose  . atorvastatin (LIPITOR) 10 MG tablet Take 10 mg by mouth at bedtime.   03/09/2016 at Unknown time  . cyanocobalamin (,VITAMIN B-12,) 1000 MCG/ML injection Inject 1,000 mcg into the muscle every 30 (thirty) days.   Past Month at Unknown time  . lamoTRIgine (LAMICTAL) 100 MG tablet Take 100 mg by mouth 2 (two) times daily.   03/09/2016 at Unknown time  . LORazepam (ATIVAN) 0.5 MG tablet Take 0.5 mg by mouth every 6 (six) hours as needed for anxiety.   03/09/2016 at Unknown time    Musculoskeletal: Strength & Muscle Tone: within normal limits Gait & Station: normal Patient leans: N/A  Psychiatric Specialty Exam: Physical Exam  Review of Systems  Unable to perform ROS: Mental acuity    Blood pressure 123/61, pulse (!) 102, temperature 97.9 F (36.6 C), temperature source Oral, resp. rate 18, height 5\' 9"  (1.753 m), weight 59 kg (130 lb).Body mass index is  19.2 kg/m.  General Appearance: Disheveled and Guarded  Eye Contact:  Minimal  Speech:  Pressured  Volume:  Increased  Mood:  Angry, Anxious and Irritable  Affect:  Labile  Thought Process:  Disorganized, Irrelevant and Descriptions of Associations: Tangential  Orientation:  Other:  person, situation  Thought Content:  Delusions and Paranoid Ideation  Suicidal Thoughts:  does not express- however is delusional, psychotic  Homicidal Thoughts:  does not express  Memory:  Immediate;   Poor Recent;   Poor Remote;   Poor  Judgement:  Impaired  Insight:  Shallow  Psychomotor Activity:  Restlessness  Concentration:  Concentration: Poor and Attention Span: Poor  Recall:  Poor  Fund of Knowledge:  Poor  Language:  Poor  Akathisia:  No  Handed:  Right  AIMS (if indicated):     Assets:  Others:  access to healthcare  ADL's:  Impaired  Cognition:  Impaired,  Mild  Sleep:  Number of Hours: 6.75    Treatment Plan Summary:Ramey Tobey GrimMcLean Jr. is a 35 y.o. AA male, who is single, on SSD , who has a hx of schizophrenia and noncompliance with medications , [facility] [place] , [facility] a night club for disorganized behavior .  Patient is uncooperative , disorganized, psychotic- majority of hx obtained from EHR , LCSW who was able to contact legal guardian - as noted above. Will start treatment , observe on the unit.   Daily contact with patient to assess and evaluate symptoms and progress in treatment and Medication management    Patient will benefit from inpatient treatment and stabilization.   Estimated length of stay is 5-7 days.   Reviewed past medical records,treatment plan.   For psychosis: Will increase Haldol to 10 mg po bid- initiated in ED. Will continue Cogentin 0.5 mg po bid for  EPS.  For mood lability: Will start Lamictal 25 mg po daily.  For insomina: Will start Trazodone 50 mg po qhs for sleep.  For agitation/anxiety: Start  PRN medications as per agitation protocol.  Will continue to monitor vitals ,medication compliance and treatment side effects while patient is here.   Will monitor for medical issues as well as call consult as needed.   Reviewed labs cbc - wnl, cmp - wnl, uds- negative, BAL ,5  ,will order TSH, Lipid panel, hba1c, pl.  Will order EKG for qtc monitoring.  CSW will start working on disposition.[facility].  [facility]   Patient to participate in therapeutic milieu .      Observation Level/Precautions:  15 minute checks    Psychotherapy:  Individual and group therapy     Consultations:  CSW /RT  Discharge Concerns:  Stability and safety       Physician Treatment Plan for Primary Diagnosis: Paranoid schizophrenia (HCC) Long Term Goal(s): Improvement in symptoms so as ready for discharge  Short Term Goals: Ability to verbalize feelings will improve and Compliance with prescribed medications will improve  Physician Treatment Plan for Secondary Diagnosis: Principal Problem:   Paranoid schizophrenia (HCC) Active Problems:   Noncompliance with medication regimen   Cocaine use disorder, mild, abuse   Cannabis use disorder, mild, abuse  Long Term Goal(s): Improvement in symptoms so as ready for discharge  Short Term Goals: Ability to verbalize feelings will improve and Compliance with prescribed medications will improve  I certify that inpatient services furnished can reasonably be expected to improve the patient's condition.    Brittnei Jagiello, MD 11/14/201712:01 PM

## 2016-06-25 NOTE — Plan of Care (Signed)
Problem: Medication: Goal: Compliance with prescribed medication regimen will improve Outcome: Progressing Pt is medication compliant with some verbal encouragement. Denies adverse drug reactions thus far, replied "no" when asked.  Problem: Safety: Goal: Periods of time without injury will increase Outcome: Progressing Q 15 minutes safety checks maintained on and off unit without self harm gestures or outburst to note.

## 2016-06-25 NOTE — Progress Notes (Signed)
Did not attend group 

## 2016-06-25 NOTE — Progress Notes (Signed)
D: Pt is very confused, paranoid and delusional. Pt was observed actively talking to self, pacing the hall and going to other Pt's room. Pt was difficult to redirect. Pt did not answer or even acknowledge the presence of the nurse; Pt continued to talk to people that were not present. Pt remained nonviolent through the shift assessment. A: Medications offered as prescribed.  Support, encouragement, and safe environment provided.  15-minute safety checks continue. R: Pt did not attend wrap-up group. Safety checks continue.

## 2016-06-25 NOTE — [facility] Suicide Risk Assessment (Signed)
[facility]   Nursing information obtained from:  Patient Demographic factors:  Male, Living alone Current Mental Status:  NA Loss Factors:  NA Historical Factors:  NA Risk Reduction Factors:  Positive social support  Total Time spent with patient: 30 minutes Principal Problem: Paranoid schizophrenia (HCC) Diagnosis:   Patient Active Problem List   Diagnosis Date Noted  . Noncompliance with medication regimen [Z91.14] 06/25/2016  . Cocaine use disorder, mild, abuse [F14.10] 06/25/2016  . Cannabis use disorder, mild, abuse [F12.10] 06/25/2016  . Paranoid schizophrenia (HCC) [F20.0] 06/24/2016   Subjective Data: Please see H&P.   Continued Clinical Symptoms:  Alcohol Use Disorder Identification Test Final Score (AUDIT): 0 The "Alcohol Use Disorders Identification Test", Guidelines for Use in Primary Care, Second Edition.  World Science writerHealth Organization [facility](WHO). Score between 0-7:  no or low risk or alcohol related problems. Score between 8-15:  moderate risk of alcohol related problems. Score between 16-19:  high risk of alcohol related problems. Score 20 or above:  warrants further diagnostic evaluation for alcohol dependence and treatment.   CLINICAL FACTORS:   Currently Psychotic Unstable or Poor Therapeutic Relationship Previous Psychiatric Diagnoses and Treatments   Musculoskeletal: Strength & Muscle Tone: within normal limits Gait & Station: normal Patient leans: N/A  Psychiatric Specialty Exam: Physical Exam  ROS  Blood pressure 123/61, pulse (!) 102, temperature 97.9 F (36.6 C), temperature source Oral, resp. rate 18, height 5\' 9"  (1.753 m), weight 59 kg (130 lb).Body mass index is 19.2 kg/m.          Please see H&P.                                                 COGNITIVE FEATURES THAT CONTRIBUTE TO RISK:  Closed-mindedness, Polarized thinking and Thought constriction (tunnel vision)    SUICIDE RISK:   Moderate:  Frequent suicidal ideation with limited intensity, and duration, some specificity in terms of plans, no associated intent, good self-control, limited dysphoria/symptomatology, some risk factors present, and identifiable protective factors, including available and accessible social support.   PLAN OF CARE: Please see H&P.   I certify that inpatient services furnished can reasonably be expected to improve the patient's condition.  Jennica Tagliaferri, MD 06/25/2016, 11:35 AM

## 2016-06-25 NOTE — [facility] Group Notes (Signed)
[facility] LCSW Group Therapy  06/25/2016 , 1:13 PM   Type of Therapy:  Group Therapy  Participation Level:  Active  Participation Quality:  Attentive  Affect:  Appropriate  Cognitive:  Alert  Insight:  Improving  Engagement in Therapy:  Engaged  Modes of Intervention:  Discussion, Exploration and Socialization  Summary of Progress/Problems: Today's group focused on the term Diagnosis.  Participants were asked to define the term, and then pronounce whether it is a negative, positive or neutral term. Invited.  Chose to not attend.  Daryel Geraldorth, Marisal Swarey B 06/25/2016 , 1:13 PM

## 2016-06-25 NOTE — Progress Notes (Signed)
D: Pt A & O to self only. Denies SI, HI, AVH and pain when assessed, replied "hell no". Presents animated, restless / fidgety. Pt is disorganized, tangential and loose in his speech; preoccupied, observed talking to self while pacing hall with with inappropriate laughter and hand gestures.  A: Schedule medications administered as prescribed with verbal education. Support and availability provided to pt. Encouraged pt to attend unit groups and voice concerns. EKG attempted X 3 and pt declined. Safety checks maintained on and off unit without self harm gestures or outburst.  R: Pt is medication compliant with some verbal encouragement. Pt exhibits no insight into his illness at present. Pt declined EKG, states "y'all going to suck my energy out through that machine and transfer it to someone [place], no, [place] [facility] [place]; so no, I don't want that on me". Tolerates all PO intake well. Declined shower as well when encouraged by staff. Remains safe on and off unit without outburst.

## 2016-06-25 NOTE — NC FL2 (Signed)
[place] MEDICAID FL2 LEVEL OF CARE SCREENING TOOL     IDENTIFICATION  Patient Name: David Daviesaul Trettin Jr. Birthdate: 09/13/1980 Sex: male Admission Date (Current Location): 06/24/2016  [facility] [id number]   Facility and Address:   [facility] [address] [place] [phone])      Provider Number: [id number]  Attending Physician Name and Address:  Jomarie LongsSaramma Eappen, MD  Relative Name and Phone Number:       Current Level of Care: Hospital (Psychiatric) Recommended Level of Care: [facility] Prior Approval Number:    Date Approved/Denied:   PASRR Number:  [id number]  Discharge Plan: [facility]    Current Diagnoses: Patient Active Problem List   Diagnosis Date Noted  . Noncompliance with medication regimen 06/25/2016  . Cocaine use disorder, mild, abuse 06/25/2016  . Cannabis use disorder, mild, abuse 06/25/2016  . Paranoid schizophrenia (HCC) 06/24/2016    Orientation RESPIRATION BLADDER Height & Weight     Self, Situation, Time, Place  Normal Continent Weight: 130 lb (59 kg) Height:  5\' 9"  (175.3 cm)  BEHAVIORAL SYMPTOMS/MOOD NEUROLOGICAL BOWEL NUTRITION STATUS      Continent  (Regular diet)  AMBULATORY STATUS COMMUNICATION OF NEEDS Skin   Independent Verbally Normal                       Personal Care Assistance Level of Assistance  Bathing, Feeding, Dressing Bathing Assistance: Independent Feeding assistance: Independent Dressing Assistance: Independent     Functional Limitations Info   (None) Sight Info: Adequate Hearing Info: Adequate Speech Info: Adequate    SPECIAL CARE FACTORS FREQUENCY                       Contractures Contractures Info: Not present    Additional Factors Info  Psychotropic     Psychotropic Info: Psychiatric Meds         Current Medications (06/25/2016):  This is the current hospital active medication list Current Facility-Administered Medications   Medication Dose Route Frequency Provider Last Rate Last Dose  . acetaminophen (TYLENOL) tablet 650 mg  650 mg Oral Q6H PRN Charm RingsJamison Y Lord, NP      . alum & mag hydroxide-simeth (MAALOX/MYLANTA) 200-200-20 MG/5ML suspension 30 mL  30 mL Oral Q4H PRN Charm RingsJamison Y Lord, NP      . atorvastatin (LIPITOR) tablet 10 mg  10 mg Oral q1800 Jomarie LongsSaramma Eappen, MD   10 mg at 07/15/16 1706  . cyanocobalamin ((VITAMIN B-12)) injection 1,000 mcg  1,000 mcg Intramuscular Once Jomarie LongsSaramma Eappen, MD      . hydrOXYzine (ATARAX/VISTARIL) tablet 25 mg  25 mg Oral TID Charm RingsJamison Y Lord, NP   25 mg at 07/16/16 1203  . lamoTRIgine (LAMICTAL) tablet 100 mg  100 mg Oral QPM Saramma Eappen, MD   100 mg at 07/15/16 1705  . lamoTRIgine (LAMICTAL) tablet 50 mg  50 mg Oral Daily Jomarie LongsSaramma Eappen, MD   50 mg at 07/16/16 0809  . LORazepam (ATIVAN) tablet 1 mg  1 mg Oral Q6H PRN Jomarie LongsSaramma Eappen, MD   1 mg at 07/13/16 2219   Or  . LORazepam (ATIVAN) injection 1 mg  1 mg Intramuscular Q6H PRN Jomarie LongsSaramma Eappen, MD      . LORazepam (ATIVAN) tablet 0.5 mg  0.5 mg Oral BID Jomarie LongsSaramma Eappen, MD   0.5 mg at 07/16/16 0809  . magnesium hydroxide (MILK OF MAGNESIA) suspension 30 mL  30 mL  Oral Daily PRN Charm RingsJamison Y Lord, NP      . OLANZapine zydis [facility](ZYPREXA) disintegrating tablet 10 mg  10 mg Oral BID PRN Jackelyn PolingJason A Berry, NP   10 mg at 07/13/16 2218   Or  . OLANZapine (ZYPREXA) injection 10 mg  10 mg Intramuscular BID PRN Jackelyn PolingJason A Berry, NP   10 mg at 07/08/16 0404  . OLANZapine zydis (ZYPREXA) disintegrating tablet 5 mg  5 mg Oral QHS Saramma Eappen, MD      . [name][START ON 08/13/2016] paliperidone (INVEGA SUSTENNA) injection 156 mg  156 mg Intramuscular Q28 days Jomarie LongsSaramma Eappen, MD      . traZODone (DESYREL) tablet 100 mg  100 mg Oral QHS Jomarie LongsSaramma Eappen, MD      . tuberculin injection 5 Units  5 Units Intradermal Once Jomarie LongsSaramma Eappen, MD         Discharge Medications: Please see discharge summary for a list of discharge medications.  Relevant Imaging  Results:  Relevant Lab Results:   Additional Information    Ida Rogueodney B Brentley Horrell, LCSW

## 2016-06-25 NOTE — BHH Counselor (Signed)
Adult Comprehensive Assessment  Patient ID: David Daviesaul Crompton Jr., male   DOB: 04/19/1981, 35 y.o.   MRN: [id number]  Information Source: Information source:  (Guardian)  Current Stressors:  Employment / Job issues: Disability Family Relationships: No family support Surveyor, quantityinancial / Lack of resources (include bankruptcy): Fixed income Housing / Lack of housing: Homeless  Living/Environment/Situation:  Living Arrangements: Alone Living conditions (as described by patient or guardian): [facility] [place] How long has patient lived in current situation?: Known history per chart[facility], then prison for 3 years-released in Nov of 16, [facility] a [facility], [facility], [facility]-once in June, once in July, see above What is atmosphere in current home: Temporary  Family History:     Childhood History:  Patient's description of current relationship with people who raised him/her: [place] in [place] and is unable to care for patient  Education:     Employment/Work Situation:   Employment situation: On disability Why is patient on disability: mental health How long has patient been on disability: unknown  Architectinancial Resources:   Surveyor, quantityinancial resources: Writereceives SSI, Medicare Does patient have a Lawyerrepresentative payee or guardian?: Yes Name of representative payee or guardian: David Davila, [employer] of , [phone]  FAX [phone]  Alcohol/Substance Abuse:      Social Support System:      Leisure/Recreation:      Strengths/Needs:      Discharge Plan:   Does patient have access to transportation?: No Plan for no access to transportation at discharge: unknown Will patient be returning to same living situation after discharge?: No Plan for living situation after discharge: Guardian looking for placement Currently  receiving community mental health services: No  Summary/Recommendations:   Summary and Recommendations (to be completed by the evaluator): David Davila is a 35 YO AA male diagnosed with schizophrenia.  He is currently unable/unwilling to engage in any meaningful conversation.  Any information contained herein is either gleaned from the chart, or conveyed from his guardian-see above-who has limited experience with and knowledge of David Davila.  The guardian states David Davila is unable to care for himself, and is looking for placement for him.  In the meantime, we will place David [facility].  David Davila can benefit from crises stabilization, medication management, therapeutic milieu and coordination with his guardian.  David Rogueodney B Marrion Accomando. 06/25/2016

## 2016-06-25 NOTE — Progress Notes (Signed)
Recreation Therapy Notes  Date: 06/25/16 Time: 1000 Location: 500 Hall Dayroom  Group Topic: Wellness  Goal Area(s) Addresses:  Patient will define components of whole wellness. Patient will verbalize benefit of whole wellness.  Intervention:  2 Decks of cards     Activity: Deck of Chance.  LRT dealt each player a card from one deck of cards.  LRT would select a card from the second deck to determine which individual will have the opportunity to do an exercise.  The individual can be determined by just the number on the card, by the suit (club, spade, diamond, heart or face card), odd numbers, even numbers or color.  The individual who has the card matching the LRT's card, will have to do an exercise determined by the LRT.  Education:Wellness, Discharge Planning.   Education Outcome: Acknowledges education/In group clarification offered/Needs additional education.   Clinical Observations/Feedback:  Pt did not attend group.    Caroll RancherMarjette Alberta Cairns, LRT/CTRS      Caroll RancherLindsay, Tevyn Codd A 06/25/2016 12:11 PM
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
## 2016-06-26 MED ORDER — HALOPERIDOL 5 MG PO TABS
15.0000 mg | ORAL_TABLET | Freq: Every day | ORAL | Status: DC
  Administered 2016-06-26: 15 mg via ORAL
  Filled 2016-06-26 (×2): qty 3

## 2016-06-26 MED ORDER — BENZTROPINE MESYLATE 0.5 MG PO TABS
0.5000 mg | ORAL_TABLET | ORAL | Status: DC
  Administered 2016-06-26 – 2016-07-01 (×9): 0.5 mg via ORAL
  Filled 2016-06-26 (×14): qty 1

## 2016-06-26 MED ORDER — TRAZODONE HCL 100 MG PO TABS
100.0000 mg | ORAL_TABLET | Freq: Every day | ORAL | Status: DC
  Administered 2016-06-26: 100 mg via ORAL
  Filled 2016-06-26 (×2): qty 1

## 2016-06-26 MED ORDER — HALOPERIDOL 5 MG PO TABS
10.0000 mg | ORAL_TABLET | Freq: Every day | ORAL | Status: DC
  Administered 2016-06-27 – 2016-06-30 (×4): 10 mg via ORAL
  Filled 2016-06-26 (×5): qty 2

## 2016-06-26 MED ORDER — TUBERCULIN PPD 5 UNIT/0.1ML ID SOLN: 5.0000 [IU] | Freq: Once | INTRADERMAL | Status: DC

## 2016-06-26 NOTE — Progress Notes (Signed)
Pt did not attend wrap up group this evening.  

## 2016-06-26 NOTE — BHH Group Notes (Signed)
BHH LCSW Group Therapy  06/26/2016 4:11 PM   Type of Therapy:  Group Therapy   Participation Level:  Engaged  Participation Quality:  Attentive  Affect:  Appropriate   Cognitive:  Alert   Insight:  Engaged  Engagement in Therapy:  Improving   Modes of Intervention:  Education, Exploration, Socialization   Summary of Progress/Problems: Invited, chose not to attend.   David Davila from [employer] was here to tell his story of recovery, inform patients about [facility] and play his guitar.   David Davila 06/26/2016 4:11 PM

## 2016-06-26 NOTE — Progress Notes (Signed)
Recreation Therapy Notes  Date: 06/26/16 Time: 1000 Location: 500 Hall Dayroom  Group Topic: Leisure Education  Goal Area(s) Addresses:  Patient will identify positive leisure activities.  Patient will identify one positive benefit of participation in leisure activities.   Intervention: Chairs, beach ball  Activity: Keep It ContractorGoing Volleyball.  LRT arranged chairs in a circle.  Patients were to sit in the circle and pass the ball to each other until they reached the desired goal.  The ball could bounce off the group but could not roll to a stop on the floor.  If the ball were to come to a stop, the count would start over.    Education:  Leisure Education, Building control surveyorDischarge Planning  Education Outcome: Acknowledges education/In group clarification offered/Needs additional education  Clinical Observations/Feedback: Pt did not attend group.   Caroll RancherMarjette Srija Southard, LRT/CTRS         Caroll RancherLindsay, Elvera Almario A 06/26/2016 12:40 PM

## 2016-06-26 NOTE — B[facility] Counselor (Signed)
[facility] is [facility] wait [facility]. 

## 2016-06-26 NOTE — Tx Team (Signed)
Interdisciplinary Treatment and Diagnostic Plan Update  06/26/2016 Time of Session: 8:43 AM  David Davila. MRN: [id number]  Principal Diagnosis: Paranoid schizophrenia ([place])  Secondary Diagnoses: Principal Problem:   Paranoid schizophrenia ([place]) Active Problems:   Noncompliance with medication regimen   Cocaine use disorder, mild, abuse   Cannabis use disorder, mild, abuse   Current Medications:  Current Facility-Administered Medications  Medication Dose Route Frequency Provider Last Rate Last Dose  . acetaminophen (TYLENOL) tablet 650 mg  650 mg Oral Q6H PRN Patrecia Pour, NP      . alum & mag hydroxide-simeth (MAALOX/MYLANTA) 200-200-20 MG/5ML suspension 30 mL  30 mL Oral Q4H PRN Patrecia Pour, NP      . benztropine (COGENTIN) tablet 0.5 mg  0.5 mg Oral BID Ursula Alert, MD   0.5 mg at 06/25/16 1639  . haloperidol (HALDOL) tablet 10 mg  10 mg Oral BID Ursula Alert, MD   10 mg at 06/25/16 1638  . hydrOXYzine (ATARAX/VISTARIL) tablet 25 mg  25 mg Oral TID Patrecia Pour, NP   25 mg at 06/25/16 1638  . lamoTRIgine (LAMICTAL) tablet 25 mg  25 mg Oral Daily Ursula Alert, MD   25 mg at 06/25/16 1112  . LORazepam (ATIVAN) tablet 1 mg  1 mg Oral Q6H PRN Ursula Alert, MD       Or  . LORazepam (ATIVAN) injection 1 mg  1 mg Intramuscular Q6H PRN Saramma Eappen, MD      . magnesium hydroxide (MILK OF MAGNESIA) suspension 30 mL  30 mL Oral Daily PRN Patrecia Pour, NP      . OLANZapine (ZYPREXA) tablet 10 mg  10 mg Oral TID PRN Ursula Alert, MD       Or  . OLANZapine (ZYPREXA) injection 10 mg  10 mg Intramuscular TID PRN Ursula Alert, MD      . traZODone (DESYREL) tablet 50 mg  50 mg Oral QHS,MR X 1 Saramma Eappen, MD        PTA Medications: Prescriptions Prior to Admission  Medication Sig Dispense Refill Last Dose  . atorvastatin (LIPITOR) 10 MG tablet Take 10 mg by mouth at bedtime.   03/09/2016 at Unknown time  . cyanocobalamin (,VITAMIN B-12,) 1000 MCG/ML injection  Inject 1,000 mcg into the muscle every 30 (thirty) days.   Past Month at Unknown time  . lamoTRIgine (LAMICTAL) 100 MG tablet Take 100 mg by mouth 2 (two) times daily.   03/09/2016 at Unknown time  . LORazepam (ATIVAN) 0.5 MG tablet Take 0.5 mg by mouth every 6 (six) hours as needed for anxiety.   03/09/2016 at Unknown time    Treatment Modalities: Medication Management, Group therapy, Case management,  1 to 1 session with clinician, Psychoeducation, Recreational therapy.   Physician Treatment Plan for Primary Diagnosis: Paranoid schizophrenia ([place]) Long Term Goal(s): Improvement in symptoms so as ready for discharge  Short Term Goals: Compliance with prescribed medications will improve  Medication Management: Evaluate patient's response, side effects, and tolerance of medication regimen.  Therapeutic Interventions: 1 to 1 sessions, Unit Group sessions and Medication administration.  Evaluation of Outcomes: Not Met  Physician Treatment Plan for Secondary Diagnosis: Principal Problem:   Paranoid schizophrenia ([place]) Active Problems:   Noncompliance with medication regimen   Cocaine use disorder, mild, abuse   Cannabis use disorder, mild, abuse   Long Term Goal(s): Improvement in symptoms so as ready for discharge  Short Term Goals: Ability to verbalize feelings will improve  Medication Management: Evaluate  patient's response, side effects, and tolerance of medication regimen.  Therapeutic Interventions: 1 to 1 sessions, Unit Group sessions and Medication administration.  Evaluation of Outcomes: Not Progressing   RN Treatment Plan for Primary Diagnosis: Paranoid schizophrenia ([place]) Long Term Goal(s): Knowledge of disease and therapeutic regimen to maintain health will improve  Short Term Goals: Ability to identify and develop effective coping behaviors will improve and Compliance with prescribed medications will improve  Medication Management: RN will administer medications as  ordered by provider, will assess and evaluate patient's response and provide education to patient for prescribed medication. RN will report any adverse and/or side effects to prescribing provider.  Therapeutic Interventions: 1 on 1 counseling sessions, Psychoeducation, Medication administration, Evaluate responses to treatment, Monitor vital signs and CBGs as ordered, Perform/monitor CIWA, COWS, AIMS and Fall Risk screenings as ordered, Perform wound care treatments as ordered.  Evaluation of Outcomes: Not Progressing   LCSW Treatment Plan for Primary Diagnosis: Paranoid schizophrenia ([place]) Long Term Goal(s): Safe transition to appropriate next level of care at discharge, Engage patient in therapeutic group addressing interpersonal concerns.  Short Term Goals: Engage patient in aftercare planning with referrals and resources  Therapeutic Interventions: Assess for all discharge needs, 1 to 1 time with Social worker, Explore available resources and support systems, Assess for adequacy in community support network, Educate family and significant other(s) on suicide prevention, Complete Psychosocial Assessment, Interpersonal group therapy.  Evaluation of Outcomes: Met  Have spoken with guardian, Chapman Moss, [phone] who is attempting to find placement for patient.  In the meantime, pt will be placed on [facility] wait list.   Progress in Treatment: Attending groups: Yes Participating in groups: Yes Taking medication as prescribed: Yes Toleration medication: Yes, no side effects reported at this time Family/Significant other contact made: Yes  Guardian Patient understands diagnosis: No  Limited insight Discussing patient identified problems/goals with staff: Yes Medical problems stabilized or resolved: Yes Denies suicidal/homicidal ideation: Yes Issues/concerns per patient self-inventory: None Other: N/A  New problem(s) identified: None identified at this time.   New Short Term/Long Term  Goal(s): None identified at this time.   Discharge Plan or Barriers:   Reason for Continuation of Hospitalization: Paranoia Disorganization Medication stabilization   Estimated Length of Stay: 3-5 days  Attendees: Patient: 06/26/2016  8:43 AM  Physician: Ursula Alert, MD 06/26/2016  8:43 AM  Nursing: Otilio Carpen, RN 06/26/2016  8:43 AM  RN Care Manager: Lars Pinks, RN 06/26/2016  8:43 AM  Social Worker: Ripley Fraise 06/26/2016  8:43 AM  Recreational Therapist: Marjette  06/26/2016  8:43 AM  Other: Norberto Sorenson 06/26/2016  8:43 AM  Other:  06/26/2016  8:43 AM    Scribe for Treatment Team:  Roque Lias 06/26/2016 8:43 AM

## 2016-06-26 NOTE — Progress Notes (Signed)
D: Pt is very confused, paranoid and delusional. Pt was flat and withdrawn to room: remained in his room for most of the evening. Pt did not answer any assessment questions. Pt continued to talk to people that were not present. Pt remained nonviolent through the shift assessment. A: Medications offered as prescribed.  Support, encouragement, and safe environment provided.  15-minute safety checks continue. R: Pt refused 2200 medications. Pt did not attend wrap-up group. Safety checks continue.

## 2016-06-26 NOTE — Progress Notes (Signed)
Nursing Note 06/26/2016 [id number]  Data Refused to complete self-inventory.  Very irritable with this nurse this AM.  Refused medications stating "I don't need to get those, I'm not taking someone else's medicine."  When told it was ordered by the doctor patient said "I don't have a doctor" and when told they were the medicines he took yesterday he said "I didn't get nothing yesterday."  Seen pacing in hallway during group.  Very short with this RN.  Another male RN approached patient and he agreed to take medicines if he could see the medicines in his hand first- he did take them around 0930.  Affect blunted but appropriate.  Exhibited signs of paranoia when approaching desk, looking behind him and saying "I'm looking for a place to hide."  When given reassurance by tech that there was nobody following him patient resumed to pacing hallways. Denies HI, SI, AVH.  Patient either in room or pacing up and down hallway throughout shift, did not attend groups, did go to cafeteria for meals.   Very difficult to get patient's attention most of the time- you can call his name and he will keep walking and talking to himself unless he is looking right at you.  Action Spoke with patient 1:1, nurse offered support to patient throughout shift.  Other male RN passed medications to patient as he was not receptive to this Charity fundraiserN.  Continues to be monitored on 15 minute checks for safety.  Response Did take PO's from this RN in evening, did not take noon vistaril.  Patient remains safe.  Irritable in AM but improved in PM.

## 2016-06-26 NOTE — Progress Notes (Signed)
[facility] Progress Note  06/26/2016 12:52 PM David DaviesPaul Peerson Jr.  MRN:  [id number] Subjective:  Pt states " Why do you want me to talk .'  Objective:David Davilais a 35 y.o.AA male, who is single, on SSD , who has a hx of schizophrenia and noncompliance with medications , [facility] [place] , [facility] .  Patient seen and chart reviewed.Discussed patient with treatment team.  Pt continues to present as disorganized , paranoid , restless, delusional . Pt unable to cooperate with writer for an evaluation - pt will not go in to his room for an assessment - likely due to paranoia. Pt keeps stating that writer is not his real doctor. Pt also per RN refused his AM medications - and reported to RN that those are not his real medications. Pt hence continues to need a lot of redirection and support on the unit.     Principal Problem: Paranoid schizophrenia (HCC) Diagnosis:   Patient Active Problem List   Diagnosis Date Noted  . Noncompliance with medication regimen [Z91.14] 06/25/2016  . Cocaine use disorder, mild, abuse [F14.10] 06/25/2016  . Cannabis use disorder, mild, abuse [F12.10] 06/25/2016  . Paranoid schizophrenia (HCC) [F20.0] 06/24/2016   Total Time spent with patient: 25 minutes  Past Psychiatric History: Please see H&P.   Past Medical History:  Past Medical History:  Diagnosis Date  . Acute psychosis   . Cannabis abuse    Past  . Paranoid schizophrenia (HCC)     Family History:Please see H&P.  Family Psychiatric  History: unknown - pt unable to discuss , guardian unaware. Social History: pt has legal guardian - [employer] [place]. History  Alcohol Use No     History  Drug Use  . Types: Cocaine, Marijuana    Social History   Social History  . Marital status: Single    Spouse name: N/A  . Number of children: N/A  . Years of education: N/A   Social History Main Topics  . Smoking status:  Current Every Day Smoker    Packs/day: 1.00    Types: Cigarettes  . Smokeless tobacco: Never Used  . Alcohol use No  . Drug use:     Types: Cocaine, Marijuana  . Sexual activity: Not Asked   Other Topics Concern  . None   Social History Narrative  . None   Additional Social History:                         Sleep: observed as restless  Appetite:  observed as fair  Current Medications: Current Facility-Administered Medications  Medication Dose Route Frequency Provider Last Rate Last Dose  . acetaminophen (TYLENOL) tablet 650 mg  650 mg Oral Q6H PRN Charm RingsJamison Y Lord, NP      . alum & mag hydroxide-simeth (MAALOX/MYLANTA) 200-200-20 MG/5ML suspension 30 mL  30 mL Oral Q4H PRN Charm RingsJamison Y Lord, NP      . benztropine (COGENTIN) tablet 0.5 mg  0.5 mg Oral BID Jomarie LongsSaramma Lyrah Bradt, MD   0.5 mg at 06/26/16 0935  . haloperidol (HALDOL) tablet 10 mg  10 mg Oral BID Jomarie LongsSaramma Baran Kuhrt, MD   10 mg at 06/26/16 0935  . hydrOXYzine (ATARAX/VISTARIL) tablet 25 mg  25 mg Oral TID Charm RingsJamison Y Lord, NP   25 mg at 06/26/16 0935  . lamoTRIgine (LAMICTAL) tablet 25 mg  25 mg  Oral Daily Jomarie Longs, MD   25 mg at 06/26/16 0935  . LORazepam (ATIVAN) tablet 1 mg  1 mg Oral Q6H PRN Jomarie Longs, MD       Or  . LORazepam (ATIVAN) injection 1 mg  1 mg Intramuscular Q6H PRN Elloise Roark, MD      . magnesium hydroxide (MILK OF MAGNESIA) suspension 30 mL  30 mL Oral Daily PRN Charm Rings, NP      . OLANZapine (ZYPREXA) tablet 10 mg  10 mg Oral TID PRN Jomarie Longs, MD       Or  . OLANZapine (ZYPREXA) injection 10 mg  10 mg Intramuscular TID PRN Jomarie Longs, MD      . traZODone (DESYREL) tablet 50 mg  50 mg Oral QHS,MR X 1 Kima Malenfant, MD      . tuberculin injection 5 Units  5 Units Intradermal Once Jomarie Longs, MD        Lab Results: No results found for this or any previous visit (from the past 48 hour(s)).  Blood Alcohol level:  Lab Results  Component Value Date   ETH <5 06/22/2016    ETH <5 03/10/2016    Metabolic Disorder Labs: No results found for: HGBA1C, MPG No results found for: PROLACTIN No results found for: CHOL, TRIG, HDL, CHOLHDL, VLDL, LDLCALC  Physical Findings: AIMS: Facial and Oral Movements Muscles of Facial Expression: None, normal Lips and Perioral Area: None, normal Jaw: None, normal Tongue: None, normal,Extremity Movements Upper (arms, wrists, hands, fingers): None, normal Lower (legs, knees, ankles, toes): None, normal, Trunk Movements Neck, shoulders, hips: None, normal, Overall Severity Severity of abnormal movements (highest score from questions above): None, normal Incapacitation due to abnormal movements: None, normal Patient's awareness of abnormal movements (rate only patient's report): No Awareness, Dental Status Current problems with teeth and/or dentures?: No Does patient usually wear dentures?: No  CIWA:    COWS:     Musculoskeletal: Strength & Muscle Tone: within normal limits Gait & Station: normal Patient leans: N/A  Psychiatric Specialty Exam: Physical Exam  Nursing note and vitals reviewed.   Review of Systems  Unable to perform ROS: Mental acuity    Blood pressure 123/61, pulse (!) 102, temperature 97.9 F (36.6 C), temperature source Oral, resp. rate 18, height 5\' 9"  (1.753 m), weight 59 kg (130 lb).Body mass index is 19.2 kg/m.  General Appearance: Disheveled  Eye Contact:  Minimal  Speech:  Normal Rate  Volume:  Decreased  Mood:observed as   Angry, Anxious and Irritable  Affect:  Labile  Thought Process:  Disorganized, Irrelevant and Descriptions of Associations: Loose  Orientation:  Other:  person  Thought Content:  Delusions, Paranoid Ideation, Rumination and responding to internal stimuli  Suicidal Thoughts:  Does not respond   Homicidal Thoughts:  does not respond  Memory:  unable to assess  Judgement:  Impaired  Insight:  Shallow  Psychomotor Activity:  Restlessness  Concentration:  Concentration:  Poor and Attention Span: Poor  Recall:  Poor  Fund of Knowledge:  Poor  Language:  Fair  Akathisia:  No  Handed:  Right  AIMS (if indicated):     Assets:  Others:  access to healthcare  ADL's:  Impaired  Cognition:  WNL  Sleep:  Number of Hours: 2.75     Treatment Plan Summary:patient continues to be disorganized , is delusional , uncooperative and irritable. Will continue to readjust medications.  Paranoid schizophrenia (HCC) unstable    Will continue today 06/26/16  plan as below except where it is noted.   Daily contact with patient to assess and evaluate symptoms and progress in treatment and Medication management For psychosis: Will increase Haldol to 10 mg po daily and 15 mg po qhs ,  initiated in ED. Will continue Cogentin 0.5 mg po bid for EPS.  For mood lability: Will continue Lamictal 25 mg po daily.  For insomina: Will increase Trazodone to 100 mg po qhs for sleep.  For agitation/anxiety: Continue PRN medications as per agitation protocol.  Will continue to monitor vitals ,medication compliance and treatment side effects while patient is here.   Will monitor for medical issues as well as call consult as needed.   Reviewed labs cbc - wnl, cmp - wnl, uds- negative, BAL ,5  , pending TSH, Lipid panel, hba1c, pl.  Ordered EKG for qtc monitoring- pending.  Will offer PPD today- 06/26/16.  CSW will continue  working on disposition.[facility].  [facility] guardian's consent.    Patient to participate in therapeutic milieu .  Shenise Wolgamott, MD 06/26/2016, 12:52 PM

## 2016-06-27 MED ORDER — HALOPERIDOL 5 MG PO TABS
10.0000 mg | ORAL_TABLET | Freq: Every evening | ORAL | Status: DC
  Administered 2016-06-27: 10 mg via ORAL
  Filled 2016-06-27 (×2): qty 2

## 2016-06-27 MED ORDER — LAMOTRIGINE 25 MG PO TABS
25.0000 mg | ORAL_TABLET | Freq: Two times a day (BID) | ORAL | Status: DC
  Administered 2016-06-27 – 2016-06-30 (×6): 25 mg via ORAL
  Filled 2016-06-27 (×8): qty 1

## 2016-06-27 MED ORDER — HALOPERIDOL 5 MG PO TABS: 20.0000 mg | ORAL_TABLET | Freq: Every day | ORAL | Status: DC

## 2016-06-27 MED ORDER — TRAZODONE HCL 150 MG PO TABS
150.0000 mg | ORAL_TABLET | Freq: Every day | ORAL | Status: DC
  Administered 2016-06-27 – 2016-07-13 (×5): 150 mg via ORAL
  Filled 2016-06-27 (×21): qty 1

## 2016-06-27 MED ORDER — PALIPERIDONE ER 3 MG PO TB24
3.0000 mg | ORAL_TABLET | Freq: Every day | ORAL | Status: DC
  Administered 2016-06-27: 3 mg via ORAL
  Filled 2016-06-27 (×2): qty 1

## 2016-06-27 NOTE — [facility] Group Notes (Signed)
[facility] Group Notes:  (Counselor/Nursing/MHT/Case Management/Adjunct)  06/27/2016 1:15PM  Type of Therapy:  Group Therapy  Participation Level:  Active  Participation Quality:  Appropriate  Affect:  Flat  Cognitive:  Oriented  Insight:  Improving  Engagement in Group:  Limited  Engagement in Therapy:  Limited  Modes of Intervention:  Discussion, Exploration and Socialization  Summary of Progress/Problems: The topic for group was balance in life.  Pt participated in the discussion about when their life was in balance and out of balance and how this feels.  Pt discussed ways to get back in balance and short term goals they can work on to get where they want to be. Invited.  Chose to not attend.   Daryel Geraldorth, Latrel Szymczak B 06/27/2016 1:07 PM

## 2016-06-27 NOTE — Progress Notes (Signed)
[facility] MD Progress Note  06/27/2016 11:08 AM David Davila.  MRN:  [id number] Subjective:  Pt states " Why do you want to talk, why do you want to go to my room, why can't we talk here near the window where its bright and sunny.'   Objective:David Davilais a 35 y.o.AA male, who is single, on SSD , who has a hx of schizophrenia and noncompliance with medications , used to live at a [facility] in [place] , was brought in by [facility] to [facility] from a night club for disorganized behavior .  Patient seen and chart reviewed.Discussed patient with treatment team.  Pt today seen as paranoid , continues to be not cooperative with evaluation. Pt was able to answer some questions - which is an improvement from yesterday. Pt refused to go in to a room for the evaluation and wanted to stay in the hallway. Pt reports sleep as restless. Pt per RN, continues to need a lot of support and prn medications.     Principal Problem: Paranoid schizophrenia (HCC) Diagnosis:   Patient Active Problem List   Diagnosis Date Noted  . Noncompliance with medication regimen [Z91.14] 06/25/2016  . Cocaine use disorder, mild, abuse [F14.10] 06/25/2016  . Cannabis use disorder, mild, abuse [F12.10] 06/25/2016  . Paranoid schizophrenia (HCC) [F20.0] 06/24/2016   Total Time spent with patient: 25 minutes  Past Psychiatric History: Please see H&P.   Past Medical History:  Past Medical History:  Diagnosis Date  . Acute psychosis   . Cannabis abuse    Past  . Paranoid schizophrenia (HCC)     Family History:Please see H&P.  Family Psychiatric  History: unknown - pt unable to discuss , guardian unaware. Social History: pt has legal guardian - [employer] [facility]. History  Alcohol Use No     History  Drug Use  . Types: Cocaine, Marijuana    Social History   Social History  . Marital status: Single    Spouse name: N/A  . Number of children: N/A  . Years of education: N/A   Social History Main Topics  . Smoking status:  Current Every Day Smoker    Packs/day: 1.00    Types: Cigarettes  . Smokeless tobacco: Never Used  . Alcohol use No  . Drug use:     Types: Cocaine, Marijuana  . Sexual activity: Not Asked   Other Topics Concern  . None   Social History Narrative  . None   Additional Social History:                         Sleep: Poor  Appetite:  Fair  Current Medications: Current Facility-Administered Medications  Medication Dose Route Frequency Provider Last Rate Last Dose  . acetaminophen (TYLENOL) tablet 650 mg  650 mg Oral Q6H PRN Charm Rings, NP      . alum & mag hydroxide-simeth (MAALOX/MYLANTA) 200-200-20 MG/5ML suspension 30 mL  30 mL Oral Q4H PRN Charm Rings, NP      . benztropine (COGENTIN) tablet 0.5 mg  0.5 mg Oral BH-qamhs Galo Sayed, MD   0.5 mg at 06/27/16 0756  . haloperidol (HALDOL) tablet 10 mg  10 mg Oral Daily Jomarie Longs, MD   10 mg at 06/27/16 0757  . haloperidol (HALDOL) tablet 10 mg  10 mg Oral QPM Malory Spurr, MD      . hydrOXYzine (ATARAX/VISTARIL) tablet 25 mg  25 mg Oral TID Charm Rings, NP  25 mg at 06/27/16 0757  . lamoTRIgine (LAMICTAL) tablet 25 mg  25 mg Oral BID Jomarie LongsSaramma Tameeka Luo, MD      . LORazepam (ATIVAN) tablet 1 mg  1 mg Oral Q6H PRN Jomarie LongsSaramma Gertie Broerman, MD   1 mg at 06/27/16 0825   Or  . LORazepam (ATIVAN) injection 1 mg  1 mg Intramuscular Q6H PRN Leshae Mcclay, MD      . magnesium hydroxide (MILK OF MAGNESIA) suspension 30 mL  30 mL Oral Daily PRN Charm RingsJamison Y Lord, NP      . OLANZapine (ZYPREXA) tablet 10 mg  10 mg Oral TID PRN Jomarie LongsSaramma Aryiana Klinkner, MD       Or  . OLANZapine (ZYPREXA) injection 10 mg  10 mg Intramuscular TID PRN Owais Pruett, MD      . paliperidone (INVEGA) 24 hr tablet 3 mg  3 mg Oral QHS Darielle Hancher, MD      . traZODone (DESYREL) tablet 150 mg  150 mg Oral QHS Sanyia Dini, MD      . tuberculin injection 5 Units  5 Units Intradermal Once Jomarie LongsSaramma Skylar Flynt, MD        Lab Results: No results found for this or  any previous visit (from the past 48 hour(s)).  Blood Alcohol level:  Lab Results  Component Value Date   ETH <5 06/22/2016   ETH <5 03/10/2016    Metabolic Disorder Labs: No results found for: HGBA1C, MPG No results found for: PROLACTIN No results found for: CHOL, TRIG, HDL, CHOLHDL, VLDL, LDLCALC  Physical Findings: AIMS: Facial and Oral Movements Muscles of Facial Expression: None, normal Lips and Perioral Area: None, normal Jaw: None, normal Tongue: None, normal,Extremity Movements Upper (arms, wrists, hands, fingers): None, normal Lower (legs, knees, ankles, toes): None, normal, Trunk Movements Neck, shoulders, hips: None, normal, Overall Severity Severity of abnormal movements (highest score from questions above): None, normal Incapacitation due to abnormal movements: None, normal Patient's awareness of abnormal movements (rate only patient's report): No Awareness, Dental Status Current problems with teeth and/or dentures?: No Does patient usually wear dentures?: No  CIWA:    COWS:     Musculoskeletal: Strength & Muscle Tone: within normal limits Gait & Station: normal Patient leans: N/A  Psychiatric Specialty Exam: Physical Exam  Nursing note and vitals reviewed.   Review of Systems  Unable to perform ROS: Mental acuity    Blood pressure (!) 110/54, pulse 72, temperature 97.9 F (36.6 C), temperature source Oral, resp. rate 16, height 5\' 9"  (1.753 m), weight 59 kg (130 lb).Body mass index is 19.2 kg/m.  General Appearance: Disheveled and Fairly Groomed  Eye Contact:  Fair  Speech:  Normal Rate  Volume:  Normal  Mood:observed as   Angry, Anxious and Irritable  Affect:  Labile  Thought Process:  Disorganized, Irrelevant and Descriptions of Associations: Tangential  Orientation:  Full (Time, Place, and Person)  Thought Content:  Delusions, Paranoid Ideation, Rumination and responding to internal stimuli  Suicidal Thoughts:  Does not respond   Homicidal  Thoughts:  No  Memory:  Immediate;   Fair Recent;   Poor Remote;   Poor  Judgement:  Impaired  Insight:  Shallow  Psychomotor Activity:  Restlessness  Concentration:  Concentration: Poor and Attention Span: Fair  Recall:  Fis[employer]Fair  Fund of Knowledge:  Poor  Language:  Fair  Akathisia:  No  Handed:  Right  AIMS (if indicated):     Assets:  Social Support  ADL's:  Intact  Cognition:  WNL  Sleep:  Number of Hours: 6.25     Treatment Plan Summary:patient today seen as  disorganized , is delusional ,and labile.  Will continue to readjust medications.  Paranoid schizophrenia (HCC) unstable    Will continue today 06/27/16  plan as below except where it is noted.   Daily contact with patient to assess and evaluate symptoms and progress in treatment and Medication management For psychosis: Will cross taper Haldol with Invega for psychosis . Haldol likely not effective . Will continue Cogentin 0.5 mg po bid for EPS.  For mood lability: Will increase Lamictal to 25 mg po BID.  For insomina: Will increase Trazodone to 150 mg po qhs for sleep.  For agitation/anxiety: Continue PRN medications as per agitation protocol.  Will continue to monitor vitals ,medication compliance and treatment side effects while patient is here.   Will monitor for medical issues as well as call consult as needed.   Reviewed labs cbc - wnl, cmp - wnl, uds- negative, BAL ,5  , pending TSH, Lipid panel, hba1c, pl.  Ordered EKG for qtc monitoring- pending.  PPD given - 06/26/16.  CSW will continue  working on disposition.[facility].  [facility]'s consent - csw working on the same.   Patient to participate in therapeutic milieu .  Marsha Gundlach, MD 06/27/2016, 11:08 AM

## 2016-06-27 NOTE — Progress Notes (Signed)
Recreation Therapy Notes  Date: 06/27/16 Time: 1000 Location: 500 Hall Dayroom  Group Topic: Coping Skills  Goal Area(s) Addresses:  Pt will able to identify positive coping skills. Pt will able to identify the importance of coping skills. Pt will able to identify the importance of using coping skills post d/c.  Intervention: Production designer, theatre/television/filmWeb design worksheet, pencils  Activity: OrthoptistWeb Design.  Patients were to identify the challenges they have been facing the seem to have them "stuck" and write them within the web design.  Once the patients identified their challenges, they were to identify at least 2 coping skills for each challenge they identified.  Education: PharmacologistCoping Skills, Building control surveyorDischarge Planning.   Education Outcome: Acknowledges understanding/In group clarification offered/Needs additional education.   Clinical Observations/Feedback: Pt did not attend group.   Caroll RancherMarjette Jontae Sonier, LRT/CTRS     Caroll RancherLindsay, Ajia Chadderdon A 06/27/2016 11:44 AM

## 2016-06-27 NOTE — Progress Notes (Signed)
Patient refused PPD.  Stated he has taken PPD several times previously and does not need PPD now.  Patient stated he is going home not to a treatment center/hospital.  Patient stated he does not take any medications.  Safety maintained with 15 minute checks.

## 2016-06-27 NOTE — Plan of Care (Signed)
Problem: Coping: Goal: Ability to cope will improve Outcome: Not Progressing Nurse discussed depression/anxiety/coping skills with patient.

## 2016-06-27 NOTE — Progress Notes (Signed)
D: Pt is very confused, paranoid, disorganized and delusional. Pt actively hallucinating; was observed talking to self; states, "you need to be quiet because Ma HillockLil Wayne is in bed sleeping." Pt could be a little anxious; was observed pacing up and down the hallway. Pt did not give any reasonable answer to assessment questions. Pt remained nonviolent through the shift assessment. A: Medications offered as prescribed.  Support, encouragement, and safe environment provided. 15-minute safety checks continue. R: Pt was med compliant.  Pt did not attend wrap-up group. Safety checks continue.

## 2016-06-27 NOTE — Progress Notes (Signed)
Nursing Progress Note: 7a-7p D: Pt currently presents with a labile and agitated affect and behavior. Pt reports to writer "I don't want to talk to yall. You can't force me. I ain't doing nothing wrong, and I ain't gonna go back to my cell. Why are y'all calling me [name]? My damn name's Stevee"  Pt changed his clothes multiple times and layered them in bizarre ways. He was talking to himself and laughing inappropriately. Pt sts "the woman on the TV was trying to ask me something. She trying to ask you something too. You need to listen."  A: Pt provided with medications per providers orders. Pt's labs and vitals were monitored throughout the day. Pt supported emotionally and encouraged to express concerns and questions. Pt educated on medications.  R: Pt's safety ensured with 15 minute and environmental checks. Pt currently denies SI/HI/Self Harm and A/V hallucinations but speaks to himself and to the tv. Pt verbally agrees to seek staff if SI/HI or A/VH occurs and to consult with staff before acting on any harmful thoughts.

## 2016-06-27 NOTE — Progress Notes (Signed)
Patient has refused PPD several times this afternoon.  Different nurses have approached patient concerning PPD.  Patient stated he is going home and does not need a PPD.

## 2016-06-28 MED ORDER — PALIPERIDONE ER 6 MG PO TB24
6.0000 mg | ORAL_TABLET | Freq: Every day | ORAL | Status: DC
  Administered 2016-06-29: 6 mg via ORAL
  Filled 2016-06-28 (×3): qty 1

## 2016-06-28 MED ORDER — HALOPERIDOL 5 MG PO TABS
5.0000 mg | ORAL_TABLET | Freq: Every evening | ORAL | Status: DC
Start: 2016-06-28 — End: 2016-07-01
  Administered 2016-06-28 – 2016-06-30 (×3): 5 mg via ORAL
  Filled 2016-06-28 (×5): qty 1

## 2016-06-28 NOTE — Progress Notes (Signed)
D: Pt is very disorganized, paranoid and delusional. Pt actively hallucinating; was observed talking to self. Pt remained in room for most of the night. Pt did not give any reasonable answer to assessment questions. Pt remained nonviolent through the shift assessment. A: Medications offered as prescribed.  Support, encouragement, and safe environment provided. 15-minute safety checks continue. R: Pt was med compliant.  Pt did not attend wrap-up group. Safety checks continue

## 2016-06-28 NOTE — Progress Notes (Signed)
Patient ID: David Daviesaul Forquer Jr., male   DOB: 12/29/1980, 35 y.o.   MRN: [id number] PER STATE REGULATIONS 482.30  THIS CHART WAS REVIEWED FOR MEDICAL NECESSITY WITH RESPECT TO THE PATIENT'S ADMISSION/ DURATION OF STAY.  NEXT REVIEW DATE: 07/02/2016  Willa RoughJENNIFER JONES Sequoyah Counterman, RN, BSN CASE MANAGER

## 2016-06-28 NOTE — Progress Notes (Signed)
Patient has been pacing the halls.  Patient was noted to be responding to internal stimuli, talking to unseen other.  Patient was naked in his bedroom and was turned toward the door exposing his genitals to staff doing checks.  Assess patient for safety offer medications as prescribed, engage patient in 1:1 staff talks.

## 2016-06-28 NOTE — [facility] Group Notes (Signed)
[facility] LCSW Group Therapy  06/28/2016  1:05 PM  Type of Therapy:  Group therapy  Participation Level:  Active  Participation Quality:  Attentive  Affect:  Flat  Cognitive:  Oriented  Insight:  Limited  Engagement in Therapy:  Limited  Modes of Intervention:  Discussion, Socialization  Summary of Progress/Problems:  Chaplain was here to lead a group on themes of hope and courage. Invited.  Chose to not attend.  Daryel Geraldorth, Sorina Derrig B 06/28/2016 1:25 PM

## 2016-06-28 NOTE — Progress Notes (Addendum)
[facility]  06/28/2016 12:11 PM David DaviesPaul Code Jr.  MRN:  [id number] Subjective:  Pt states " I am fine. I do not have anything to say."     Objective:Montae Shirlee LatchMcLean Davilais a 35 y.o.AA male, who is single, on SSD , who has a hx of schizophrenia and noncompliance with medications , [facility] [place] , [facility] .  Patient seen and chart reviewed.Discussed patient with treatment team.  Pt today continues to be disorganized, is paranoid , delusional.' Pt unable to sit down for an evaluation - attempted to assess pt in the hallway since he refused to go to his room or any other rooms for an evaluation. Pt has been taking his medications when offered , continues to need support and redirection.     Principal Problem: Paranoid schizophrenia (HCC) Diagnosis:   Patient Active Problem List   Diagnosis Date Noted  . Noncompliance with medication regimen [Z91.14] 06/25/2016  . Cocaine use disorder, mild, abuse [F14.10] 06/25/2016  . Cannabis use disorder, mild, abuse [F12.10] 06/25/2016  . Paranoid schizophrenia (HCC) [F20.0] 06/24/2016   Total Time spent with patient: 25 minutes  Past Psychiatric History: Please see H&P.   Past Medical History:  Past Medical History:  Diagnosis Date  . Acute psychosis   . Cannabis abuse    Past  . Paranoid schizophrenia (HCC)     Family History:Please see H&P.  Family Psychiatric  History: unknown - pt unable to discuss , guardian unaware. Social History: pt has legal guardian - [employer] [facility] [place]. History  Alcohol Use No     History  Drug Use  . Types: Cocaine, Marijuana    Social History   Social History  . Marital status: Single    Spouse name: N/A  . Number of children: N/A  . Years of education: N/A   Social History Main Topics  . Smoking status: Current Every Day Smoker    Packs/day: 1.00    Types: Cigarettes  . Smokeless tobacco: Never Used  .  Alcohol use No  . Drug use:     Types: Cocaine, Marijuana  . Sexual activity: Not Asked   Other Topics Concern  . None   Social History Narrative  . None   Additional Social History:                         Sleep: Fair  Appetite:  Fair  Current Medications: Current Facility-Administered Medications  Medication Dose Route Frequency Provider Last Rate Last Dose  . acetaminophen (TYLENOL) tablet 650 mg  650 mg Oral Q6H PRN Charm RingsJamison Y Lord, NP      . alum & mag hydroxide-simeth (MAALOX/MYLANTA) 200-200-20 MG/5ML suspension 30 mL  30 mL Oral Q4H PRN Charm RingsJamison Y Lord, NP      . benztropine (COGENTIN) tablet 0.5 mg  0.5 mg Oral BH-qamhs Alyla Pietila, MD   0.5 mg at 06/28/16 0741  . haloperidol (HALDOL) tablet 10 mg  10 mg Oral Daily Jomarie LongsSaramma Shoji Pertuit, MD   10 mg at 06/28/16 0742  . haloperidol (HALDOL) tablet 5 mg  5 mg Oral QPM Braylynn Ghan, MD      . hydrOXYzine (ATARAX/VISTARIL) tablet 25 mg  25 mg Oral TID Charm RingsJamison Y Lord, NP   25 mg at 06/28/16 0741  . lamoTRIgine (LAMICTAL) tablet 25 mg  25 mg Oral BID Jomarie LongsSaramma Jamillia Closson, MD  25 mg at 06/28/16 0742  . LORazepam (ATIVAN) tablet 1 mg  1 mg Oral Q6H PRN Jomarie Longs, MD   1 mg at 06/27/16 0825   Or  . LORazepam (ATIVAN) injection 1 mg  1 mg Intramuscular Q6H PRN Benjerman Molinelli, MD      . magnesium hydroxide (MILK OF MAGNESIA) suspension 30 mL  30 mL Oral Daily PRN Charm Rings, NP      . OLANZapine (ZYPREXA) tablet 10 mg  10 mg Oral TID PRN Jomarie Longs, MD       Or  . OLANZapine (ZYPREXA) injection 10 mg  10 mg Intramuscular TID PRN Jomarie Longs, MD      . paliperidone (INVEGA) 24 hr tablet 6 mg  6 mg Oral QHS Ambika Zettlemoyer, MD      . traZODone (DESYREL) tablet 150 mg  150 mg Oral QHS Jomarie Longs, MD   150 mg at 06/27/16 2131  . tuberculin injection 5 Units  5 Units Intradermal Once Jomarie Longs, MD        Lab Results: No results found for this or any previous visit (from the past 48 hour(s)).  Blood Alcohol  level:  Lab Results  Component Value Date   ETH <5 06/22/2016   ETH <5 03/10/2016    Metabolic Disorder Labs: No results found for: HGBA1C, MPG No results found for: PROLACTIN No results found for: CHOL, TRIG, HDL, CHOLHDL, VLDL, LDLCALC  Physical Findings: AIMS: Facial and Oral Movements Muscles of Facial Expression: None, normal Lips and Perioral Area: None, normal Jaw: None, normal Tongue: None, normal,Extremity Movements Upper (arms, wrists, hands, fingers): None, normal Lower (legs, knees, ankles, toes): None, normal, Trunk Movements Neck, shoulders, hips: None, normal, Overall Severity Severity of abnormal movements (highest score from questions above): None, normal Incapacitation due to abnormal movements: None, normal Patient's awareness of abnormal movements (rate only patient's report): No Awareness, Dental Status Current problems with teeth and/or dentures?: No Does patient usually wear dentures?: No  CIWA:    COWS:     Musculoskeletal: Strength & Muscle Tone: within normal limits Gait & Station: normal Patient leans: N/A  Psychiatric Specialty Exam: Physical Exam  Nursing note and vitals reviewed.   Review of Systems  Unable to perform ROS: Mental acuity    Blood pressure (!) 110/54, pulse 72, temperature 97.9 F (36.6 C), temperature source Oral, resp. rate 16, height 5\' 9"  (1.753 m), weight 59 kg (130 lb).Body mass index is 19.2 kg/m.  General Appearance: Disheveled  Eye Contact:  Fair  Speech:  Normal Rate  Volume:  Normal  Mood:observed as   Anxious and Irritable  Affect:  Labile  Thought Process:  Disorganized, Irrelevant and Descriptions of Associations: Circumstantial  Orientation:  Full (Time, Place, and Person)  Thought Content:  Delusions, Paranoid Ideation, Rumination and responding to internal stimuli  Suicidal Thoughts: denies  Homicidal Thoughts:  No  Memory:  Immediate;   Fair Recent;   Poor Remote;   Poor  Judgement:  Impaired   Insight:  Shallow  Psychomotor Activity:  Restlessness  Concentration:  Concentration: Poor and Attention Span: Fair  Recall:  [employer] of Knowledge:  Poor  Language:  Fair  Akathisia:  No  Handed:  Right  AIMS (if indicated):     Assets:  Social Support  ADL's:  Intact  Cognition:  WNL  Sleep:  Number of Hours: 4.5     Treatment Plan Summary:patient today seen as  Delusional ,disorganized , and labile.  Will continue to readjust medications.  Paranoid schizophrenia (HCC) unstable    Will continue today 06/28/16  plan as below except where it is noted.   Daily contact with patient to assess and evaluate symptoms and progress in treatment and Medication management For psychosis: Will cross taper Haldol with Invega for psychosis . Haldol likely not effective . Will increase Invega to 6 mg po qhs. Will continue Cogentin 0.5 mg po bid for EPS.  For mood lability: Increased Lamictal to 25 mg po BID.  For insomina: Will continue Trazodone  150 mg po qhs for sleep.  For agitation/anxiety: Continue PRN medications as per agitation protocol.  Will continue to monitor vitals ,medication compliance and treatment side effects while patient is here.   Will monitor for medical issues as well as call consult as needed.   Reviewed labs cbc - wnl, cmp - wnl, uds- negative, BAL ,5  , pending TSH, Lipid panel, hba1c, pl.  Ordered EKG for qtc monitoring- pending.  PPD offered- 06/26/16- pt refused.  CSW will continue  working on disposition.[facility].  [facility]'s consent - csw working on the same.   Patient to participate in therapeutic milieu .  Vicenta Olds, MD 06/28/2016, 12:11 PM

## 2016-06-28 NOTE — Progress Notes (Signed)
Recreation Therapy Notes  Date: 06/27/16 Time: 1000 Location: 500 Hall Dayroom  Group Topic: Stress Management  Goal Area(s) Addresses:  Patient will verbalize importance of using healthy stress management.  Patient will identify positive emotions associated with healthy stress management.   Behavioral Response: Observed  Intervention: Guided Training and development officermagery Script, Calm App  Activity :  Child psychotherapistavorite Place Imagery; Body Scan Meditation.  LRT introduced the stress management techniques of guided imagery and meditation to patients.  Patients were given to opportunity participate in the techniques by following along as LRT read script for the guided imagery and listening to the recorded meditation.  Education:  Stress Management, Discharge Planning.   Education Outcome: Acknowledges edcuation/In group clarification offered/Needs additional education  Clinical Observations/Feedback: Pt sat quietly during the stress management techniques.  Pt didn't participate but was respectful of those who were participating.  Pt left early and did not return.    Caroll RancherMarjette Bethene Hankinson, LRT/CTRS     Caroll RancherLindsay, Kalla Watson A 06/28/2016 11:55 AM

## 2016-06-29 DIAGNOSIS — F17210 Nicotine dependence, cigarettes, uncomplicated: Secondary | ICD-10-CM

## 2016-06-29 DIAGNOSIS — F200 Paranoid schizophrenia: Principal | ICD-10-CM

## 2016-06-29 DIAGNOSIS — Z79899 Other long term (current) drug therapy: Secondary | ICD-10-CM

## 2016-06-29 NOTE — [facility] Group Notes (Signed)
[facility] Group Notes: (Clinical Social Work)   06/29/2016      Type of Therapy:  Group Therapy   Participation Level:  Did Not Attend despite MHT prompting   Emilyrose Darrah Grossman-Orr, LCSW 06/29/2016, 12:24 PM     

## 2016-06-29 NOTE — [facility] Counselor (Signed)
Clinical Social Work Note  Educational psychologist met with patient this morning, requested and was given last 3 days of patient's doctor progress notes.  Writer signed his documentation that he was here and did review.  Selmer Dominion, LCSW 06/29/2016, 9:53 AM

## 2016-06-29 NOTE — Progress Notes (Signed)
[facility] MD Progress Note  06/29/2016 3:34 PM David Davila.  MRN:  [id number] Subjective:  Pt seen in the hallway.  He states that he has no issues.  Objective:  David Davila.is a 35 y.o.AA male, who is single, on SSD , who has a hx of schizophrenia and noncompliance with medications , [place] [facility] [place] , was brought in by [employer] to [facility] from a night club for disorganized behavior .  Patient seen and chart reviewed.  Discussed patient with treatment team.  Pt continues to be disorganized, is paranoid , delusional.' Pt has been taking his medications when offered , continues to need support and redirection.  Principal Problem: Paranoid schizophrenia (HCC) Diagnosis:   Patient Active Problem List   Diagnosis Date Noted  . Noncompliance with medication regimen [Z91.14] 06/25/2016  . Cocaine use disorder, mild, abuse [F14.10] 06/25/2016  . Cannabis use disorder, mild, abuse [F12.10] 06/25/2016  . Paranoid schizophrenia (HCC) [F20.0] 06/24/2016   Total Time spent with patient: 25 minutes  Past Psychiatric History: Please see H&P.   Past Medical History:  Past Medical History:  Diagnosis Date  . Acute psychosis   . Cannabis abuse    Past  . Paranoid schizophrenia (HCC)     Family History:Please see H&P.  Family Psychiatric  History: unknown - pt unable to discuss , guardian unaware. Social History: pt has legal guardian - [employer] [place]. History  Alcohol Use No     History  Drug Use  . Types: Cocaine, Marijuana    Social History   Social History  . Marital status: Single    Spouse name: N/A  . Number of children: N/A  . Years of education: N/A   Social History Main Topics  . Smoking status: Current Every Day Smoker    Packs/day: 1.00    Types: Cigarettes  . Smokeless tobacco: Never Used  . Alcohol use No  . Drug use:     Types: Cocaine, Marijuana  . Sexual activity: Not Asked   Other Topics Concern  . None   Social History Narrative  . None    Additional Social History:                         Sleep: Fair  Appetite:  Fair  Current Medications: Current Facility-Administered Medications  Medication Dose Route Frequency Provider Last Rate Last Dose  . acetaminophen (TYLENOL) tablet 650 mg  650 mg Oral Q6H PRN Charm Rings, NP      . alum & mag hydroxide-simeth (MAALOX/MYLANTA) 200-200-20 MG/5ML suspension 30 mL  30 mL Oral Q4H PRN Charm Rings, NP      . benztropine (COGENTIN) tablet 0.5 mg  0.5 mg Oral BH-qamhs Saramma Eappen, MD   0.5 mg at 06/29/16 0801  . haloperidol (HALDOL) tablet 10 mg  10 mg Oral Daily Jomarie Longs, MD   10 mg at 06/29/16 0801  . haloperidol (HALDOL) tablet 5 mg  5 mg Oral QPM Saramma Eappen, MD   5 mg at 06/28/16 1814  . hydrOXYzine (ATARAX/VISTARIL) tablet 25 mg  25 mg Oral TID Charm Rings, NP   25 mg at 06/29/16 1147  . lamoTRIgine (LAMICTAL) tablet 25 mg  25 mg Oral BID Jomarie Longs, MD   25 mg at 06/29/16 0801  . LORazepam (ATIVAN) tablet 1 mg  1 mg Oral Q6H PRN Jomarie Longs, MD   1 mg at 06/29/16 0808   Or  .  LORazepam (ATIVAN) injection 1 mg  1 mg Intramuscular Q6H PRN Saramma Eappen, MD      . magnesium hydroxide (MILK OF MAGNESIA) suspension 30 mL  30 mL Oral Daily PRN Charm RingsJamison Y Lord, NP      . OLANZapine (ZYPREXA) tablet 10 mg  10 mg Oral TID PRN Jomarie LongsSaramma Eappen, MD       Or  . OLANZapine (ZYPREXA) injection 10 mg  10 mg Intramuscular TID PRN Jomarie LongsSaramma Eappen, MD      . paliperidone (INVEGA) 24 hr tablet 6 mg  6 mg Oral QHS Saramma Eappen, MD      . traZODone (DESYREL) tablet 150 mg  150 mg Oral QHS Jomarie LongsSaramma Eappen, MD   150 mg at 06/27/16 2131  . tuberculin injection 5 Units  5 Units Intradermal Once Jomarie LongsSaramma Eappen, MD        Lab Results: No results found for this or any previous visit (from the past 48 hour(s)).  Blood Alcohol level:  Lab Results  Component Value Date   ETH <5 06/22/2016   ETH <5 03/10/2016    Metabolic Disorder Labs: No results found for:  HGBA1C, MPG No results found for: PROLACTIN No results found for: CHOL, TRIG, HDL, CHOLHDL, VLDL, LDLCALC  Physical Findings: AIMS: Facial and Oral Movements Muscles of Facial Expression: None, normal Lips and Perioral Area: None, normal Jaw: None, normal Tongue: None, normal,Extremity Movements Upper (arms, wrists, hands, fingers): None, normal Lower (legs, knees, ankles, toes): None, normal, Trunk Movements Neck, shoulders, hips: None, normal, Overall Severity Severity of abnormal movements (highest score from questions above): None, normal Incapacitation due to abnormal movements: None, normal Patient's awareness of abnormal movements (rate only patient's report): No Awareness, Dental Status Current problems with teeth and/or dentures?: No Does patient usually wear dentures?: No  CIWA:    COWS:     Musculoskeletal: Strength & Muscle Tone: within normal limits Gait & Station: normal Patient leans: N/A  Psychiatric Specialty Exam: Physical Exam  Nursing note and vitals reviewed.   Review of Systems  Unable to perform ROS: Mental acuity    Blood pressure (!) 110/54, pulse 72, temperature 97.9 F (36.6 C), temperature source Oral, resp. rate 16, height 5\' 9"  (1.753 m), weight 59 kg (130 lb).Body mass index is 19.2 kg/m.  General Appearance: Disheveled  Eye Contact:  Fair  Speech:  Normal Rate  Volume:  Normal  Mood:observed as   Anxious and Irritable  Affect:  Labile  Thought Process:  Disorganized, Irrelevant and Descriptions of Associations: Circumstantial  Orientation:  Full (Time, Place, and Person)  Thought Content:  Delusions, Paranoid Ideation, Rumination and responding to internal stimuli  Suicidal Thoughts: denies  Homicidal Thoughts:  No  Memory:  Immediate;   Fair Recent;   Poor Remote;   Poor  Judgement:  Impaired  Insight:  Shallow  Psychomotor Activity:  Restlessness  Concentration:  Concentration: Poor and Attention Span: Fair  Recall:  [employer]Fair  Fund  of Knowledge:  Poor  Language:  Fair  Akathisia:  No  Handed:  Right  AIMS (if indicated):     Assets:  Social Support  ADL's:  Intact  Cognition:  WNL  Sleep:  Number of Hours: 6.75   Treatment Plan Summary:patient today seen as  Delusional, disorganized, and labile. Will continue to readjust medications.  Paranoid schizophrenia (HCC) unstable  Will continue today 06/29/16  plan as below except where it is noted.  Daily contact with patient to assess and evaluate symptoms and progress in  treatment and Medication management   For psychosis: Will cross taper Haldol with Invega for psychosis . Haldol likely not effective . Will increase Invega to 6 mg po qhs. Will continue Cogentin 0.5 mg po bid for EPS.  For mood lability: Increased Lamictal to 25 mg po BID.  For insomina: Will continue Trazodone  150 mg po qhs for sleep.  For agitation/anxiety: Continue PRN medications as per agitation protocol.  Will continue to monitor vitals ,medication compliance and treatment side effects while patient is here.   Will monitor for medical issues as well as call consult as needed.   Reviewed labs cbc - wnl, cmp - wnl, uds- negative, BAL ,5  , pending TSH, Lipid panel, hba1c, pl.  Ordered EKG for qtc monitoring- pending.  Patient   PPD offered- 06/26/16- pt refused.  CSW will continue  working on disposition.[facility].  [facility] guardian's consent - csw working on the same.   Patient to participate in therapeutic milieu.  David QuaSheila May Agustin, NP [facility] 06/29/2016, 3:34 PM   Agree with NP Progress Note as above

## 2016-06-29 NOTE — Progress Notes (Signed)
DAR NOTE: Patient presents with anxious affect and depressed mood.  Denies pain, auditory and visual hallucinations.  Rates depression at 0, hopelessness at 0, and anxiety at 0.  Maintained on routine safety checks.  Medications given as prescribed.  Support and encouragement offered as needed.  Patient is withdrawn and isolative.  Minimal interaction with staff.  Patient observed pacing and standing at the end of the hallway at the beginning of this shift.  patient observed responding to internal stimuli.  Offered no complaint.

## 2016-06-29 NOTE — Progress Notes (Signed)
  D: Pt would hold a conversation with the Clinical research associatewriter. However, he was observed pacing the floor, dancing, and laughing.. Pt still somewhat labile, but was not  as aggressive as the previous day. Pt refused scheduled hs meds. Pt has no questions or concerns.    A:  Support and encouragement was offered. 15 min checks continued for safety.  R: Pt remains safe.

## 2016-06-30 MED ORDER — HALOPERIDOL 5 MG PO TABS
5.0000 mg | ORAL_TABLET | Freq: Every day | ORAL | Status: DC
  Administered 2016-07-01: 5 mg via ORAL
  Filled 2016-06-30 (×3): qty 1

## 2016-06-30 MED ORDER — PALIPERIDONE ER 3 MG PO TB24
9.0000 mg | ORAL_TABLET | Freq: Every day | ORAL | Status: DC
  Administered 2016-06-30: 9 mg via ORAL
  Filled 2016-06-30 (×3): qty 3

## 2016-06-30 MED ORDER — LAMOTRIGINE 25 MG PO TABS
50.0000 mg | ORAL_TABLET | Freq: Two times a day (BID) | ORAL | Status: DC
  Administered 2016-06-30 – 2016-07-08 (×11): 50 mg via ORAL
  Filled 2016-06-30 (×19): qty 2

## 2016-06-30 NOTE — [facility] Group Notes (Signed)
[facility] Group Notes: (Clinical Social Work)   06/30/2016      Type of Therapy:  Group Therapy   Participation Level:  Did Not Attend despite MHT prompting   Ambrose MantleMareida Grossman-Orr, LCSW 06/30/2016, 1:27 PM

## 2016-06-30 NOTE — Progress Notes (Signed)
Nursing Note 06/30/2016 [id number]  Data Reports sleeping well.  Took AM medicines without incident (had assistance from another nurse).  Declined to fill out self-inventory sheet, got argumentative about it. Affect Labile, but mostly pleasant in AM until EKG requested.  Patient got irritable and refused while talking about an unrelated topic- [place]  Denies HI, SI, AVH.  Spends most of time pacing up and down hallway talking to self, smiling most of the time, laughing to self at times.  Remained on unit for breakfast and lunch per nursing judgment, went to gym in afternoon due to improvement in behavior.  Got irritable around med time when prompted to come to desk, pointed at another patient who had a paper bag on his head stating "I don't need medicine, he needs it."  Patient argued.  He approached nurse several minutes later agreeing to take medicine before dinner time.  Kept back from dinner due to irritability and lability before dinner.  Refused labs in evening.  Action Spoke with patient 1:1, nurse offered support to patient throughout shift.  Given PRN ativan at lunch.  Continues to be monitored on 15 minute checks for safety.  Response PRN ativan effective.  Remains labile with some verbal outbursts and irritability when requests are made by staff, but approaching patient later after making request patient will agree.

## 2016-06-30 NOTE — Progress Notes (Signed)
Adult Psychoeducational Group Note  Date:  06/30/2016 Time:  1000-1030  Group Topic/Focus:  Healthy Support Systems & Check-In Group During this group we asked patients to list their goal and a healthy support person.  At the end of the group education was given on the importance of sleep hygiene, medication compliance, and openness to staff about feelings and safety concerns.  Participation Level:  Guarded, no participation  Affect:  Flat  Additional Comments:  Patient attended full duration of group but declined to share simply waving and shaking his head when asked his goal and support person.  Lindajo RoyalDaniel P Maddock Finigan 06/30/2016, 12:48 PM

## 2016-06-30 NOTE — Progress Notes (Signed)
[facility]  06/30/2016 10:40 AM David DaviesPaul Jacko Jr.  MRN:  [id number] Subjective:  Patient in dayroom.  Refused to speak with this NP.  He states that he was not sick therefore he did not need the EKG.  Explained that her rate and rhythm needed to be monitored as prescribed meds may adversely affect cardiac rythyms.  Patient did not sleep well.  Objective:  David Davilais a 35 y.o.AA male, who is single, on SSD , who has a hx of schizophrenia and noncompliance with medications , [facility] [place] , was [facility] [employer] [facility] [place] night [place] for disorganized behavior . Patient seen and chart reviewed.  Discussed patient with treatment team.  Pt continues to be disorganized, is paranoid , delusional.' Pt has been taking his medications when offered, continues to need support and redirection.  Principal Problem: Paranoid schizophrenia (HCC) Diagnosis:   Patient Active Problem List   Diagnosis Date Noted  . Noncompliance with medication regimen [Z91.14] 06/25/2016  . Cocaine use disorder, mild, abuse [F14.10] 06/25/2016  . Cannabis use disorder, mild, abuse [F12.10] 06/25/2016  . Paranoid schizophrenia (HCC) [F20.0] 06/24/2016   Total Time spent with patient: 25 minutes  Past Psychiatric History: Please see H&P.   Past Medical History:  Past Medical History:  Diagnosis Date  . Acute psychosis   . Cannabis abuse    Past  . Paranoid schizophrenia (HCC)     Family History:Please see H&P.  Family Psychiatric  History: unknown - pt unable to discuss , guardian unaware. Social History: pt has legal guardian - [employer] [facility]. History  Alcohol Use No     History  Drug Use  . Types: Cocaine, Marijuana    Social History   Social History  . Marital status: Single    Spouse name: N/A  . Number of children: N/A  . Years of education: N/A   Social History Main Topics  . Smoking status: Current Every Day Smoker    Packs/day: 1.00    Types: Cigarettes   . Smokeless tobacco: Never Used  . Alcohol use No  . Drug use:     Types: Cocaine, Marijuana  . Sexual activity: Not Asked   Other Topics Concern  . None   Social History Narrative  . None   Additional Social History:                         Sleep: Fair  Appetite:  Fair  Current Medications: Current Facility-Administered Medications  Medication Dose Route Frequency Provider Last Rate Last Dose  . acetaminophen (TYLENOL) tablet 650 mg  650 mg Oral Q6H PRN Charm RingsJamison Y Lord, NP      . alum & mag hydroxide-simeth (MAALOX/MYLANTA) 200-200-20 MG/5ML suspension 30 mL  30 mL Oral Q4H PRN Charm RingsJamison Y Lord, NP      . benztropine (COGENTIN) tablet 0.5 mg  0.5 mg Oral BH-qamhs Saramma Eappen, MD   0.5 mg at 06/30/16 0831  . haloperidol (HALDOL) tablet 5 mg  5 mg Oral QPM Jomarie LongsSaramma Eappen, MD   5 mg at 06/29/16 1724  . [START ON 07/01/2016] haloperidol (HALDOL) tablet 5 mg  5 mg Oral Daily Adonis BrookSheila Agustin, NP      . hydrOXYzine (ATARAX/VISTARIL) tablet 25 mg  25 mg Oral TID Charm RingsJamison Y Lord, NP   25 mg at 06/30/16 [id number]  . lamoTRIgine (LAMICTAL) tablet 50 mg  50 mg Oral BID Velna HatchetSheila  Agustin, NP      . LORazepam (ATIVAN) tablet 1 mg  1 mg Oral Q6H PRN Jomarie Longs, MD   1 mg at 06/29/16 0808   Or  . LORazepam (ATIVAN) injection 1 mg  1 mg Intramuscular Q6H PRN Saramma Eappen, MD      . magnesium hydroxide (MILK OF MAGNESIA) suspension 30 mL  30 mL Oral Daily PRN Charm Rings, NP      . OLANZapine (ZYPREXA) tablet 10 mg  10 mg Oral TID PRN Jomarie Longs, MD       Or  . OLANZapine (ZYPREXA) injection 10 mg  10 mg Intramuscular TID PRN Jomarie Longs, MD      . paliperidone (INVEGA) 24 hr tablet 9 mg  9 mg Oral QHS Adonis Brook, NP      . traZODone (DESYREL) tablet 150 mg  150 mg Oral QHS Jomarie Longs, MD   150 mg at 06/29/16 2130  . tuberculin injection 5 Units  5 Units Intradermal Once Jomarie Longs, MD        Lab Results: No results found for this or any previous visit (from the  past 48 hour(s)).  Blood Alcohol level:  Lab Results  Component Value Date   ETH <5 06/22/2016   ETH <5 03/10/2016    Metabolic Disorder Labs: No results found for: HGBA1C, MPG No results found for: PROLACTIN No results found for: CHOL, TRIG, HDL, CHOLHDL, VLDL, LDLCALC  Physical Findings: AIMS: Facial and Oral Movements Muscles of Facial Expression: None, normal Lips and Perioral Area: None, normal Jaw: None, normal Tongue: None, normal,Extremity Movements Upper (arms, wrists, hands, fingers): None, normal Lower (legs, knees, ankles, toes): None, normal, Trunk Movements Neck, shoulders, hips: None, normal, Overall Severity Severity of abnormal movements (highest score from questions above): None, normal Incapacitation due to abnormal movements: None, normal Patient's awareness of abnormal movements (rate only patient's report): No Awareness, Dental Status Current problems with teeth and/or dentures?: No Does patient usually wear dentures?: No  CIWA:    COWS:     Musculoskeletal: Strength & Muscle Tone: within normal limits Gait & Station: normal Patient leans: N/A  Psychiatric Specialty Exam: Physical Exam  Nursing note and vitals reviewed. Psychiatric: His affect is angry, blunt, labile and inappropriate. He is aggressive, hyperactive and combative. Thought content is paranoid and delusional. He expresses impulsivity.    Review of Systems  Unable to perform ROS: Mental acuity    Blood pressure (!) 110/54, pulse 72, temperature 97.9 F (36.6 C), temperature source Oral, resp. rate 16, height 5\' 9"  (1.753 m), weight 59 kg (130 lb).Body mass index is 19.2 kg/m.  General Appearance: Disheveled  Eye Contact:  Fair  Speech:  Normal Rate  Volume:  Normal  Mood:observed as   Anxious and Irritable  Affect:  Labile  Thought Process:  Disorganized, Irrelevant and Descriptions of Associations: Circumstantial  Orientation:  Full (Time, Place, and Person)  Thought Content:   Delusions, Paranoid Ideation, Rumination and responding to internal stimuli  Suicidal Thoughts: denies  Homicidal Thoughts:  No  Memory:  Immediate;   Fair Recent;   Poor Remote;   Poor  Judgement:  Impaired  Insight:  Shallow  Psychomotor Activity:  Restlessness  Concentration:  Concentration: Poor and Attention Span: Fair  Recall:  [employer] of Knowledge:  Poor  Language:  Fair  Akathisia:  No  Handed:  Right  AIMS (if indicated):     Assets:  Social Support  ADL's:  Intact  Cognition:  WNL  Sleep:  Number of Hours: 6   Treatment Plan Summary:patient today seen as  Delusional, disorganized, and labile. Will continue to readjust medications.  Paranoid schizophrenia (HCC) unstable  Will continue today 06/30/16  plan as below except where it is noted.  Daily contact with patient to assess and evaluate symptoms and progress in treatment and Medication management   For psychosis: Will cross taper Haldol with Invega for psychosis . Haldol likely not effective.  Decreased Haldol by 5 mg. Will increase Invega to 9 mg po qhs. Will continue Cogentin 0.5 mg po bid for EPS.  For mood lability: Increased Lamictal to 50 mg po BID.  For insomina: Will continue Trazodone  150 mg po qhs for sleep.  For agitation/anxiety: Continue PRN medications as per agitation protocol.  Will continue to monitor vitals ,medication compliance and treatment side effects while patient is here.   Will monitor for medical issues as well as call consult as needed.   Reviewed labs cbc wnl, cmp WNL, uds- negative, BAL 5 , re ordered TSH, Lipid panel, hba1c, PL.  Ordered EKG for qtc monitoring- pending, pt refusing.  Patient PPD offered- 06/26/16- pt refused.  CSW will continue  working on disposition.[facility]  [facility]'s consent - csw working on the same.   Patient to  participate in therapeutic milieu.  Lindwood QuaSheila May Agustin, NP [facility] 06/30/2016, 10:40 AM   Agree with NP Progress Note as above

## 2016-06-30 NOTE — Progress Notes (Signed)
D.  Pt pacing on approach, denies complaints.  Pt states he will see his doctor tonight and will decide about medications at that time.  Explained to Pt that doctor will be in in the morning, Pt became extremely angry as he felt he was being mocked and disrespected when smiled at.  Pt states "you were laughing, you were not being friendly".  Apologized to Pt, Pt then stated "Get out of my face and get back in your cage" indicating the medication room.  Complied with Pt wishes so that he could calm down.  Pt did take medications for other RN on hall after cursing her out as well.  Pt very guarded and paranoid.  A.  This staff member did as Pt requested and stayed away from him so that he could calm down.  R.  Pt did calm down, watched TV in dayroom, then went to bed.  Will continue to monitor, Pt remains safe.

## 2016-06-30 NOTE — [facility] Counselor (Signed)
CSW confirmed with [facility] at [phone] that patient is to remain on [facility] waitlist at 12:55 PM on 06/30/2016   Catherine C Harrill, LCSW   

## 2016-06-30 NOTE — Progress Notes (Signed)
D.  Pt flat and soft spoken on approach, denies complaints at this time.  Pt did not attend evening wrap up group.  Pt did come to medication window to take his night medications and was compliant with all.  Minimal interaction on unit.  Pt denies SI/HI/hallucinations but does appear to be responding to verbal hallucinations at times.  A.  Support and encouragement offered, medications given as ordered  R.  Pt remains safe on unit, will continue to monitor.

## 2016-06-30 NOTE — Progress Notes (Signed)
Pt did not attend wrap up group this evening.  

## 2016-07-01 MED ORDER — TUBERCULIN PPD 5 UNIT/0.1ML ID SOLN: 5.0000 [IU] | Freq: Once | INTRADERMAL | Status: DC

## 2016-07-01 MED ORDER — CYANOCOBALAMIN 1000 MCG/ML IJ SOLN
1000.0000 ug | Freq: Once | INTRAMUSCULAR | Status: DC
  Filled 2016-07-01: qty 1

## 2016-07-01 MED ORDER — LORAZEPAM 0.5 MG PO TABS
0.5000 mg | ORAL_TABLET | Freq: Two times a day (BID) | ORAL | Status: DC
  Administered 2016-07-04 – 2016-07-17 (×27): 0.5 mg via ORAL
  Filled 2016-07-01 (×29): qty 1

## 2016-07-01 MED ORDER — ATORVASTATIN CALCIUM 10 MG PO TABS
10.0000 mg | ORAL_TABLET | Freq: Every day | ORAL | Status: DC
  Administered 2016-07-03 – 2016-07-16 (×14): 10 mg via ORAL
  Filled 2016-07-01 (×20): qty 1

## 2016-07-01 NOTE — Progress Notes (Signed)
Recreation Therapy Notes  Date: 07/01/16 Time: 1000 Location: 500 Hall Dayroom  Group Topic: Self-Esteem  Goal Area(s) Addresses:  Patient will identify positive ways to increase self-esteem. Patient will verbalize benefit of increased self-esteem.  Intervention: Worksheets with blank faces, markers, colored pencils  Activity: How I See Me.  LRT introduced the concept of self-esteem and why it's important.  Patients were given a worksheet with a blank face on it.  Patients were asked to draw or use words to describe how they see themselves on the blank face.  Patients were then asked to draw/write how other people see them on the back of the worksheet.  Education:  Self-Esteem, Building control surveyorDischarge Planning.   Education Outcome: Acknowledges education/In group clarification offered/Needs additional education  Clinical Observations/Feedback: Pt did not attend group.    Caroll RancherMarjette Shakema Surita, LRT/CTRS         Caroll RancherLindsay, Rowen Hur A 07/01/2016 12:40 PM

## 2016-07-01 NOTE — Plan of Care (Signed)
Problem: Medication: Goal: Compliance with prescribed medication regimen will improve Outcome: Progressing Pt has been compliant with medication regimen   

## 2016-07-01 NOTE — Progress Notes (Signed)
[facility] MD Progress Note  07/01/2016 2:17 PM David Davila.  MRN:  [id number] Subjective:  Pt states " I am taking a lot of medications."      Objective:David Davilais a 35 y.o.AA male, who is single, on SSD , who has a hx of schizophrenia and noncompliance with medications , used to live at a [facility] in [place] , was brought in by [employer] to [facility] from a night club for disorganized behavior .  Patient seen and chart reviewed.Discussed patient with treatment team.  Pt today seen in his room , continues to be paranoid - but was able to talk to writer today in his room. This is different from his presentation on Friday when he was seen as unable to hold a conversation with Clinical research associate as well as would not go to his room for an evaluation , rather would stay in the hallways. Pt continues to be intrusive , labile often , requiring redirection. Will continue to readjust medications.    Principal Problem: Paranoid schizophrenia (HCC) Diagnosis:   Patient Active Problem List   Diagnosis Date Noted  . Noncompliance with medication regimen [Z91.14] 06/25/2016  . Cocaine use disorder, mild, abuse [F14.10] 06/25/2016  . Cannabis use disorder, mild, abuse [F12.10] 06/25/2016  . Paranoid schizophrenia (HCC) [F20.0] 06/24/2016   Total Time spent with patient: 25 minutes  Past Psychiatric History: Please see H&P.   Past Medical History:  Past Medical History:  Diagnosis Date  . Acute psychosis   . Cannabis abuse    Past  . Paranoid schizophrenia (HCC)     Family History:Please see H&P.  Family Psychiatric  History: unknown - pt unable to discuss , guardian unaware. Social History: pt has legal guardian - [employer]. History  Alcohol Use No     History  Drug Use  . Types: Cocaine, Marijuana    Social History   Social History  . Marital status: Single    Spouse name: N/A  . Number of children: N/A  . Years of education: N/A   Social History Main Topics  . Smoking status: Current  Every Day Smoker    Packs/day: 1.00    Types: Cigarettes  . Smokeless tobacco: Never Used  . Alcohol use No  . Drug use:     Types: Cocaine, Marijuana  . Sexual activity: Not Asked   Other Topics Concern  . None   Social History Narrative  . None   Additional Social History:                         Sleep: Fair  Appetite:  Fair  Current Medications: Current Facility-Administered Medications  Medication Dose Route Frequency Provider Last Rate Last Dose  . acetaminophen (TYLENOL) tablet 650 mg  650 mg Oral Q6H PRN David Rings, NP      . alum & mag hydroxide-simeth (MAALOX/MYLANTA) 200-200-20 MG/5ML suspension 30 mL  30 mL Oral Q4H PRN David Rings, NP      . benztropine (COGENTIN) tablet 0.5 mg  0.5 mg Oral BH-qamhs Chelbi Herber, MD   0.5 mg at 07/01/16 0840  . haloperidol (HALDOL) tablet 5 mg  5 mg Oral Daily David Brook, NP   5 mg at 07/01/16 0841  . hydrOXYzine (ATARAX/VISTARIL) tablet 25 mg  25 mg Oral TID David Rings, NP   25 mg at 07/01/16 1245  . lamoTRIgine (LAMICTAL) tablet 50 mg  50 mg Oral BID David Brook, NP  50 mg at 07/01/16 0841  . LORazepam (ATIVAN) tablet 1 mg  1 mg Oral Q6H PRN David Davila Elexus Barman, MD   1 mg at 06/30/16 1131   Or  . LORazepam (ATIVAN) injection 1 mg  1 mg Intramuscular Q6H PRN David Myhand, MD      . LORazepam (ATIVAN) tablet 0.5 mg  0.5 mg Oral BID David Cancel, MD      . magnesium hydroxide (MILK OF MAGNESIA) suspension 30 mL  30 mL Oral Daily PRN David RingsJamison Y Lord, NP      . OLANZapine (ZYPREXA) tablet 10 mg  10 mg Oral TID PRN David Davila Bryann Gentz, MD       Or  . OLANZapine (ZYPREXA) injection 10 mg  10 mg Intramuscular TID PRN David Davila Juanice Warburton, MD      . paliperidone (INVEGA) 24 hr tablet 9 mg  9 mg Oral QHS David BrookSheila Agustin, NP   9 mg at 06/30/16 2106  . traZODone (DESYREL) tablet 150 mg  150 mg Oral QHS David Davila Orry Sigl, MD   150 mg at 06/30/16 2106  . tuberculin injection 5 Units  5 Units Intradermal Once David Davila Lemon Whitacre, MD         Lab Results: No results found for this or any previous visit (from the past 48 hour(s)).  Blood Alcohol level:  Lab Results  Component Value Date   ETH <5 06/22/2016   ETH <5 03/10/2016    Metabolic Disorder Labs: No results found for: HGBA1C, MPG No results found for: PROLACTIN No results found for: CHOL, TRIG, HDL, CHOLHDL, VLDL, LDLCALC  Physical Findings: AIMS: Facial and Oral Movements Muscles of Facial Expression: None, normal Lips and Perioral Area: None, normal Jaw: None, normal Tongue: None, normal,Extremity Movements Upper (arms, wrists, hands, fingers): None, normal Lower (legs, knees, ankles, toes): None, normal, Trunk Movements Neck, shoulders, hips: None, normal, Overall Severity Severity of abnormal movements (highest score from questions above): None, normal Incapacitation due to abnormal movements: None, normal Patient's awareness of abnormal movements (rate only patient's report): No Awareness, Dental Status Current problems with teeth and/or dentures?: No Does patient usually wear dentures?: No  CIWA:    COWS:     Musculoskeletal: Strength & Muscle Tone: within normal limits Gait & Station: normal Patient leans: N/A  Psychiatric Specialty Exam: Physical Exam  Nursing note and vitals reviewed.   Review of Systems  Psychiatric/Behavioral: The patient is nervous/anxious.   All other systems reviewed and are negative.   Blood pressure (!) 104/53, pulse 69, temperature 97.8 F (36.6 C), temperature source Oral, resp. rate 16, height 5\' 9"  (1.753 m), weight 59 kg (130 lb).Body mass index is 19.2 kg/m.  General Appearance: Guarded  Eye Contact:  Fair  Speech:  Normal Rate  Volume:  Normal  Mood:observed as   Anxious and Irritable on and off   Affect:  Labile  Thought Process:  Disorganized, Irrelevant and Descriptions of Associations: Circumstantial  Orientation:  Full (Time, Place, and Person)  Thought Content:  Delusions, Paranoid Ideation,  Rumination and responding to internal stimuli  Suicidal Thoughts: denies  Homicidal Thoughts:  No  Memory:  Immediate;   Fair Recent;   Poor Remote;   Poor  Judgement:  Impaired  Insight:  Shallow  Psychomotor Activity:  Restlessness  Concentration:  Concentration: Poor and Attention Span: Fair  Recall:  FiservFair  Fund of Knowledge:  Poor  Language:  Fair  Akathisia:  No  Handed:  Right  AIMS (if indicated):     Assets:  Social  Support  ADL's:  Intact  Cognition:  WNL  Sleep:  Number of Hours: 6     Treatment Plan Summary:patient continues to be labile , disorganized , will continue treatment.     Paranoid schizophrenia (HCC) unstable    Will continue today 07/01/16  plan as below except where it is noted.   Daily contact with patient to assess and evaluate symptoms and progress in treatment and Medication management For psychosis: Will cross taper Haldol with Invega for psychosis . Haldol likely not effective . Will increase Invega to 9 mg po qhs. Will continue Cogentin 0.5 mg po bid for EPS.  For mood lability: Will continue  Lamictal to 50 mg po BID. Will add Ativan 0.5 mg po bid.  For insomina: Will continue Trazodone  150 mg po qhs for sleep.  For agitation/anxiety: Continue PRN medications as per agitation protocol.  Will continue to monitor vitals ,medication compliance and treatment side effects while patient is here.   Will monitor for medical issues as well as call consult as needed.   Reviewed labs - pt refused-  TSH, Lipid panel, hba1c, pl.  Pt refused EKG for qtc monitoring- will reorder .  PPD offered- 06/26/16- pt refused.  CSW will continue  working on disposition.[facility].  [facility] guardian's consent - csw working on the same.   Patient to participate in therapeutic milieu .  Shree Espey, MD 07/01/2016, 2:17 PM

## 2016-07-01 NOTE — Progress Notes (Signed)
Did not attend group 

## 2016-07-01 NOTE — Progress Notes (Signed)
D.  Pt was asked if he would agree to lab work this AM as he had refused it the previous evening.  Pt asked if this is a hospital and wanted to know why lab had to come over from someplace else to draw his blood.  Pt became very agitated as this was explained to him.  He stated "no one said good morning or asked me how I was:".    Pt then through his full cup of coffee into nurses station.  A.  Will ask doctor for a unit restriction due to impulsive volatility.  R.  Pt in dayroom laughing and speaking to peers about incident.

## 2016-07-01 NOTE — Progress Notes (Signed)
Patient continues to respond to unseen other, patient ignored Clinical research associatewriter as she asked him to come take his medications.  Patient refused EKG and PPD this shift.  As well as his evening medications.   Assess patient for safety, continue to monitor as prescribed.   Patient able to contract for safety.

## 2016-07-01 NOTE — Progress Notes (Addendum)
Patient ID: David Daviesaul Knoth Jr., male   DOB: 02/21/1981, 35 y.o.   MRN: [id number] D: Client isolates to room most of the shift, but did come out an walk the hall during the shift, no interaction noted. Client overheard in the shower talking and laughing with self. A: Writer attempted to introduce self to client "I don't have no nurse" client refused medications, reported "someone keep coming in my room and messing with that thing(client point to heating/cooling unit) they going to "f**king jail, tonight" "I done told you now I don't want to hear that, I don't have no nurse, that's the end of that, don't even try to start no conversation with me" "I ain't taking no medications" "get out my f**king room" Staff will monitor q1315min for safety. R: Client is safe on the unit, did not attend group.

## 2016-07-01 NOTE — Tx Team (Signed)
Interdisciplinary Treatment and Diagnostic Plan Update  07/01/2016 Time of Session: 8:46 AM  David Davila. MRN: [id number]  Principal Diagnosis: Paranoid schizophrenia ([place])  Secondary Diagnoses: Principal Problem:   Paranoid schizophrenia ([place]) Active Problems:   Noncompliance with medication regimen   Cocaine use disorder, mild, abuse   Cannabis use disorder, mild, abuse   Current Medications:  Current Facility-Administered Medications  Medication Dose Route Frequency Provider Last Rate Last Dose  . acetaminophen (TYLENOL) tablet 650 mg  650 mg Oral Q6H PRN Patrecia Pour, NP      . alum & mag hydroxide-simeth (MAALOX/MYLANTA) 200-200-20 MG/5ML suspension 30 mL  30 mL Oral Q4H PRN Patrecia Pour, NP      . benztropine (COGENTIN) tablet 0.5 mg  0.5 mg Oral BH-qamhs Saramma Eappen, MD   0.5 mg at 07/01/16 0840  . haloperidol (HALDOL) tablet 5 mg  5 mg Oral QPM Saramma Eappen, MD   5 mg at 06/30/16 1718  . haloperidol (HALDOL) tablet 5 mg  5 mg Oral Daily Kerrie Buffalo, NP   5 mg at 07/01/16 0841  . hydrOXYzine (ATARAX/VISTARIL) tablet 25 mg  25 mg Oral TID Patrecia Pour, NP   25 mg at 07/01/16 0840  . lamoTRIgine (LAMICTAL) tablet 50 mg  50 mg Oral BID Kerrie Buffalo, NP   50 mg at 07/01/16 0841  . LORazepam (ATIVAN) tablet 1 mg  1 mg Oral Q6H PRN Ursula Alert, MD   1 mg at 06/30/16 1131   Or  . LORazepam (ATIVAN) injection 1 mg  1 mg Intramuscular Q6H PRN Saramma Eappen, MD      . magnesium hydroxide (MILK OF MAGNESIA) suspension 30 mL  30 mL Oral Daily PRN Patrecia Pour, NP      . OLANZapine (ZYPREXA) tablet 10 mg  10 mg Oral TID PRN Ursula Alert, MD       Or  . OLANZapine (ZYPREXA) injection 10 mg  10 mg Intramuscular TID PRN Ursula Alert, MD      . paliperidone (INVEGA) 24 hr tablet 9 mg  9 mg Oral QHS Kerrie Buffalo, NP   9 mg at 06/30/16 2106  . traZODone (DESYREL) tablet 150 mg  150 mg Oral QHS Ursula Alert, MD   150 mg at 06/30/16 2106  . tuberculin injection 5  Units  5 Units Intradermal Once Ursula Alert, MD        PTA Medications: Prescriptions Prior to Admission  Medication Sig Dispense Refill Last Dose  . atorvastatin (LIPITOR) 10 MG tablet Take 10 mg by mouth at bedtime.   03/09/2016 at Unknown time  . cyanocobalamin (,VITAMIN B-12,) 1000 MCG/ML injection Inject 1,000 mcg into the muscle every 30 (thirty) days.   Past Month at Unknown time  . lamoTRIgine (LAMICTAL) 100 MG tablet Take 100 mg by mouth 2 (two) times daily.   03/09/2016 at Unknown time  . LORazepam (ATIVAN) 0.5 MG tablet Take 0.5 mg by mouth every 6 (six) hours as needed for anxiety.   03/09/2016 at Unknown time    Treatment Modalities: Medication Management, Group therapy, Case management,  1 to 1 session with clinician, Psychoeducation, Recreational therapy.   Physician Treatment Plan for Primary Diagnosis: Paranoid schizophrenia ([place]) Long Term Goal(s): Improvement in symptoms so as ready for discharge  Short Term Goals: Compliance with prescribed medications will improve  Medication Management: Evaluate patient's response, side effects, and tolerance of medication regimen.  Therapeutic Interventions: 1 to 1 sessions, Unit Group sessions and Medication administration.  Evaluation of Outcomes: Not Met  11/20::Patient today seen as  delusional ,disorganized , and labile.  Will continue to readjust medications. Will cross taper Haldol with Invega for psychosis . Haldol likely not effective . Will increase Invega to 6 mg po qhs. Will continue Cogentin 0.5 mg po bid for EPS.  For mood lability: Increased Lamictal to 25 mg po BID.   Physician Treatment Plan for Secondary Diagnosis: Principal Problem:   Paranoid schizophrenia ([place]) Active Problems:   Noncompliance with medication regimen   Cocaine use disorder, mild, abuse   Cannabis use disorder, mild, abuse   Long Term Goal(s): Improvement in symptoms so as ready for discharge  Short Term Goals: Ability to  verbalize feelings will improve  Medication Management: Evaluate patient's response, side effects, and tolerance of medication regimen.  Therapeutic Interventions: 1 to 1 sessions, Unit Group sessions and Medication administration.  Evaluation of Outcomes: Progressing   RN Treatment Plan for Primary Diagnosis: Paranoid schizophrenia ([place]) Long Term Goal(s): Knowledge of disease and therapeutic regimen to maintain health will improve  Short Term Goals: Ability to identify and develop effective coping behaviors will improve and Compliance with prescribed medications will improve  Medication Management: RN will administer medications as ordered by provider, will assess and evaluate patient's response and provide education to patient for prescribed medication. RN will report any adverse and/or side effects to prescribing provider.  Therapeutic Interventions: 1 on 1 counseling sessions, Psychoeducation, Medication administration, Evaluate responses to treatment, Monitor vital signs and CBGs as ordered, Perform/monitor CIWA, COWS, AIMS and Fall Risk screenings as ordered, Perform wound care treatments as ordered.  Evaluation of Outcomes: Progressing   LCSW Treatment Plan for Primary Diagnosis: Paranoid schizophrenia ([place]) Long Term Goal(s): Safe transition to appropriate next level of care at discharge, Engage patient in therapeutic group addressing interpersonal concerns.  Short Term Goals: Engage patient in aftercare planning with referrals and resources  Therapeutic Interventions: Assess for all discharge needs, 1 to 1 time with Social worker, Explore available resources and support systems, Assess for adequacy in community support network, Educate family and significant other(s) on suicide prevention, Complete Psychosocial Assessment, Interpersonal group therapy.  Evaluation of Outcomes: Met  Have spoken with guardian, Chapman Moss, [phone] who is attempting to find placement for  patient.  In the meantime, pt will be [facility] on [facility] wait list. 11/20:  Pt on [place] wait list.  [facility] has come to do evaluation.  Pt refused PPD-will need to get chest xray.  Guardian sent back signed releases today so we can try to access records at previous hospitalizations   Progress in Treatment: Attending groups: Yes Participating in groups: Yes Taking medication as prescribed: Yes Toleration medication: Yes, no side effects reported at this time Family/Significant other contact made: Yes  Guardian Patient understands diagnosis: No  Limited insight Discussing patient identified problems/goals with staff: Yes Medical problems stabilized or resolved: Yes Denies suicidal/homicidal ideation: Yes Issues/concerns per patient self-inventory: None Other: N/A  New problem(s) identified: None identified at this time.   New Short Term/Long Term Goal(s): None identified at this time.   Discharge Plan or Barriers:   Reason for Continuation of Hospitalization: Paranoia Disorganization Medication stabilization   Estimated Length of Stay: 3-5 days  Attendees: Patient: 07/01/2016  8:46 AM  Physician: Ursula Alert, MD 07/01/2016  8:46 AM  Nursing: Hedy Jacob RN 07/01/2016  8:46 AM  RN Care Manager: Lars Pinks, RN 07/01/2016  8:46 AM  Social Worker: Ripley Fraise 07/01/2016  8:46 AM  Recreational Therapist: Winfield Cunas 07/01/2016  8:46 AM  Other: Norberto Sorenson 07/01/2016  8:46 AM  Other:  07/01/2016  8:46 AM    Scribe for Treatment Team:  Roque Lias 07/01/2016 8:46 AM

## 2016-07-01 NOTE — [facility] Group Notes (Signed)
[facility] LCSW Group Therapy  07/01/2016 1:15 pm  Type of Therapy: Process Group Therapy  Participation Level:  Active  Participation Quality:  Appropriate  Affect:  Flat  Cognitive:  Oriented  Insight:  Improving  Engagement in Group:  Limited  Engagement in Therapy:  Limited  Modes of Intervention:  Activity, Clarification, Education, Problem-solving and Support  Summary of Progress/Problems: Today's group addressed the issue of overcoming obstacles.  Patients were asked to identify their biggest obstacle post d/c that stands in the way of their on-going success, and then problem solve as to how to manage this. Invited.  Chose to not attend.  Was washing an article of clothing in his sink.  Daryel Geraldorth, Jaryn Rosko B 07/01/2016   3:40 PM

## 2016-07-02 MED ORDER — OLANZAPINE 10 MG PO TABS
10.0000 mg | ORAL_TABLET | Freq: Two times a day (BID) | ORAL | Status: DC | PRN
  Administered 2016-07-02 – 2016-07-03 (×2): 10 mg via ORAL
  Filled 2016-07-02 (×2): qty 1

## 2016-07-02 MED ORDER — PALIPERIDONE ER 3 MG PO TB24
9.0000 mg | ORAL_TABLET | Freq: Every day | ORAL | Status: DC
  Administered 2016-07-02 – 2016-07-08 (×7): 9 mg via ORAL
  Filled 2016-07-02 (×8): qty 3

## 2016-07-02 MED ORDER — BENZTROPINE MESYLATE 0.5 MG PO TABS
0.5000 mg | ORAL_TABLET | Freq: Every day | ORAL | Status: DC
  Administered 2016-07-02 – 2016-07-10 (×9): 0.5 mg via ORAL
  Filled 2016-07-02 (×10): qty 1

## 2016-07-02 MED ORDER — BENZTROPINE MESYLATE 1 MG/ML IJ SOLN
0.5000 mg | Freq: Every day | INTRAMUSCULAR | Status: DC
  Filled 2016-07-02 (×10): qty 0.5

## 2016-07-02 MED ORDER — OLANZAPINE 10 MG IM SOLR: 10.0000 mg | Freq: Two times a day (BID) | INTRAMUSCULAR | Status: DC | PRN

## 2016-07-02 MED ORDER — OLANZAPINE 10 MG IM SOLR
10.0000 mg | Freq: Every day | INTRAMUSCULAR | Status: DC
  Filled 2016-07-02 (×8): qty 10

## 2016-07-02 NOTE — [facility] Group Notes (Signed)
[facility] LCSW Group Therapy  07/02/2016 , 4:41 PM   Type of Therapy:  Group Therapy  Participation Level:  Active  Participation Quality:  Attentive  Affect:  Appropriate  Cognitive:  Alert  Insight:  Improving  Engagement in Therapy:  Engaged  Modes of Intervention:  Discussion, Exploration and Socialization  Summary of Progress/Problems: Today's group focused on the term Diagnosis.  Participants were asked to define the term, and then pronounce whether it is a negative, positive or neutral term. Invited.  Chose not to attend.  "That's not my name!"   When asked what his name is, he replied that is none of my this and that business.  Daryel Geraldorth, Sojourner Behringer B 07/02/2016 , 4:41 PM

## 2016-07-02 NOTE — Progress Notes (Signed)
Recreation Therapy Notes  Date: 07/02/16 Time: 1000 Location: 500 Hall Dayroom  Group Topic: Coping Skills  Goal Area(s) Addresses:  Pt will be able to identify positive coping skills. Pt will be able to identify the benefits of using coping skills post d/c.  Intervention: Can with coping skills and various words written on paper  Activity: Coping Skills Pictionary.  Patients picked a piece of paper from the can with a word on it.  Patient was to draw what was on the strip of paper on the board.  The remainder of the group had to try and guess what the person was drawing.  The person that guesses the picture will get the next turn.   Education: PharmacologistCoping Skills, Building control surveyorDischarge Planning.   Education Outcome: Acknowledges understanding/In group clarification offered/Needs additional education.   Clinical Observations/Feedback: Pt did not attend group.    Caroll RancherMarjette Garyn Arlotta, LRT/CTRS      Caroll RancherLindsay, Canden Cieslinski A 07/02/2016 12:05 PM

## 2016-07-02 NOTE — Progress Notes (Addendum)
[facility] MD Progress Note  07/02/2016 2:24 PM David Davila.  MRN:  [id number] Subjective:  Pt refuses to cooperate.      Objective:David Davilais a 35 y.o.AA male, who is single, on SSD , who has a hx of schizophrenia and noncompliance with medications , used to live at a [facility] in [place] , was brought in by [employer] to [facility] from a night club for disorganized behavior .  Patient seen and chart reviewed.Discussed patient with treatment team.  Pt seen in the hallway, several attempts made to evaluate patient - pt refuses to cooperate . Pt per RN also has been refusing medications since 07/01/16 PM, he refused AM medications today. Hence discussed with DR.Oates about nonemergent forced medications- Dr.Oates to do second opinion for the same.    Principal Problem: Paranoid schizophrenia (HCC) Diagnosis:   Patient Active Problem List   Diagnosis Date Noted  . Noncompliance with medication regimen [Z91.14] 06/25/2016  . Cocaine use disorder, mild, abuse [F14.10] 06/25/2016  . Cannabis use disorder, mild, abuse [F12.10] 06/25/2016  . Paranoid schizophrenia (HCC) [F20.0] 06/24/2016   Total Time spent with patient: 25 minutes  Past Psychiatric History: Please see H&P.   Past Medical History:  Past Medical History:  Diagnosis Date  . Acute psychosis   . Cannabis abuse    Past  . Paranoid schizophrenia (HCC)     Family History:Please see H&P.  Family Psychiatric  History: unknown - pt unable to discuss , guardian unaware. Social History: pt has legal guardian - [employer] [facility] [place]. History  Alcohol Use No     History  Drug Use  . Types: Cocaine, Marijuana    Social History   Social History  . Marital status: Single    Spouse name: N/A  . Number of children: N/A  . Years of education: N/A   Social History Main Topics  . Smoking status: Current Every Day Smoker    Packs/day: 1.00    Types: Cigarettes  . Smokeless tobacco: Never Used  . Alcohol use No  . Drug use:     Types:  Cocaine, Marijuana  . Sexual activity: Not Asked   Other Topics Concern  . None   Social History Narrative  . None   Additional Social History:                         Sleep: observed as fair  Appetite:  Observed as fair  Current Medications: Current Facility-Administered Medications  Medication Dose Route Frequency Provider Last Rate Last Dose  . acetaminophen (TYLENOL) tablet 650 mg  650 mg Oral Q6H PRN Charm Rings, NP      . alum & mag hydroxide-simeth (MAALOX/MYLANTA) 200-200-20 MG/5ML suspension 30 mL  30 mL Oral Q4H PRN Charm Rings, NP      . atorvastatin (LIPITOR) tablet 10 mg  10 mg Oral q1800 Brendan Gadson, MD      . benztropine (COGENTIN) tablet 0.5 mg  0.5 mg Oral Daily Samarth Ogle, MD   0.5 mg at 07/02/16 1127   Or  . benztropine mesylate (COGENTIN) injection 0.5 mg  0.5 mg Intramuscular Daily Detrell Umscheid, MD      . cyanocobalamin ((VITAMIN B-12)) injection 1,000 mcg  1,000 mcg Intramuscular Once Jomarie Longs, MD      . hydrOXYzine (ATARAX/VISTARIL) tablet 25 mg  25 mg Oral TID Charm Rings, NP   25 mg at 07/02/16 1135  . lamoTRIgine (LAMICTAL) tablet 50 mg  50 mg Oral BID Adonis BrookSheila Agustin, NP   50 mg at 07/01/16 0841  . LORazepam (ATIVAN) tablet 1 mg  1 mg Oral Q6H PRN Jomarie LongsSaramma Ceria Suminski, MD   1 mg at 06/30/16 1131   Or  . LORazepam (ATIVAN) injection 1 mg  1 mg Intramuscular Q6H PRN Altamese Deguire, MD      . LORazepam (ATIVAN) tablet 0.5 mg  0.5 mg Oral BID Derward Marple, MD      . magnesium hydroxide (MILK OF MAGNESIA) suspension 30 mL  30 mL Oral Daily PRN Charm RingsJamison Y Lord, NP      . paliperidone (INVEGA) 24 hr tablet 9 mg  9 mg Oral Daily Mariaisabel Bodiford, MD   9 mg at 07/02/16 1127   Or  . OLANZapine (ZYPREXA) injection 10 mg  10 mg Intramuscular Daily Jhoselyn Ruffini, MD      . OLANZapine (ZYPREXA) tablet 10 mg  10 mg Oral BID PRN Jomarie LongsSaramma Siriah Treat, MD       Or  . OLANZapine (ZYPREXA) injection 10 mg  10 mg Intramuscular BID PRN Jomarie LongsSaramma Symphanie Cederberg,  MD      . traZODone (DESYREL) tablet 150 mg  150 mg Oral QHS Jomarie LongsSaramma Tiani Stanbery, MD   150 mg at 06/30/16 2106  . tuberculin injection 5 Units  5 Units Intradermal Once Jomarie LongsSaramma Kiamesha Samet, MD        Lab Results: No results found for this or any previous visit (from the past 48 hour(s)).  Blood Alcohol level:  Lab Results  Component Value Date   ETH <5 06/22/2016   ETH <5 03/10/2016    Metabolic Disorder Labs: No results found for: HGBA1C, MPG No results found for: PROLACTIN No results found for: CHOL, TRIG, HDL, CHOLHDL, VLDL, LDLCALC  Physical Findings: AIMS: Facial and Oral Movements Muscles of Facial Expression: None, normal Lips and Perioral Area: None, normal Jaw: None, normal Tongue: None, normal,Extremity Movements Upper (arms, wrists, hands, fingers): None, normal Lower (legs, knees, ankles, toes): None, normal, Trunk Movements Neck, shoulders, hips: None, normal, Overall Severity Severity of abnormal movements (highest score from questions above): None, normal Incapacitation due to abnormal movements: None, normal Patient's awareness of abnormal movements (rate only patient's report): No Awareness, Dental Status Current problems with teeth and/or dentures?: No Does patient usually wear dentures?: No  CIWA:    COWS:     Musculoskeletal: Strength & Muscle Tone: within normal limits Gait & Station: normal Patient leans: N/A  Psychiatric Specialty Exam: Physical Exam  Nursing note and vitals reviewed.   Review of Systems  Unable to perform ROS: Mental acuity    Blood pressure (!) 104/53, pulse 69, temperature 97.8 F (36.6 C), temperature source Oral, resp. rate 16, height 5\' 9"  (1.753 m), weight 59 kg (130 lb).Body mass index is 19.2 kg/m.  General Appearance: Guarded  Eye Contact:  Poor  Speech:  normal rate - but is mute when Clinical research associatewriter attempted to assess  Volume:  see above  Mood:observed as   does not respond on and off   Affect:  Labile  Thought Process:   Disorganized, Irrelevant and Descriptions of Associations: Circumstantial  Orientation:  Other:  to person  Thought Content:  Delusions, Paranoid Ideation, Rumination and responding to internal stimuli- seen as talking to self   Suicidal Thoughts: did not express any  Homicidal Thoughts:  did not express any  Memory:  Immediate; grossly intact Recent; poor  Remote;  poor  Judgement:  Impaired  Insight:  Shallow  Psychomotor Activity:  Restlessness  Concentration:  Concentration: Poor and Attention Span: Fair  Recall:  [employer]  Fund of Knowledge:  Poor  Language:  Fair  Akathisia:  No  Handed:  Right  AIMS (if indicated):     Assets:  Social Support  ADL's:  Intact  Cognition:  WNL  Sleep:  Number of Hours: 6.75     Treatment Plan Summary:patient today seen as responding to internal stimuli, irritable and refuses to cooperate with evaluation , refuses medications. Dr.Oates has done a second opinion for forced nonemergent medications - today 07/02/16 - valid for 7 days.      Paranoid schizophrenia (HCC) unstable    Will continue today 07/02/16  plan as below except where it is noted.   Daily contact with patient to assess and evaluate symptoms and progress in treatment and Medication management For psychosis: Will start Invega to 9 mg po/  Zyprexa 10 mg IM daily . Will continue Cogentin 0.5 mg po/IM bid for EPS.  For mood lability: Will continue  Lamictal to 50 mg po BID. Will continue  Ativan 0.5 mg po bid.  For insomina: Will continue Trazodone  150 mg po qhs for sleep.  For agitation/anxiety: Continue PRN medications as per agitation protocol.  Will continue to monitor vitals ,medication compliance and treatment side effects while patient is here.   Will monitor for medical issues as well as call consult as needed.   Reviewed labs - pt refused-  TSH, Lipid panel, hba1c, pl.  Pt refused EKG for qtc monitoring- will reorder .  PPD offered- 06/26/16- pt  refused.  CSW will continue  working on disposition.[facility]  [facility].  [facility] - I have reviewed it- [facility]16. Pt as per records was noncompliant with out patient follow ups or medications zyprexa, lexapro - was [facility] from prison [facility] November [facility]. Pt also was abusing cocaine, cannabis . He presented with psychosis , mood lability - responded well to invega - was given invega sustenna IM. He was also found to have Vitamin b12 deficiency - was started Vitamin b12 replacement. Pt was discharged to his guardian who found placement. His family hx if unknown , he was adopted as a child, went up to 9 th grade.    Patient to participate in therapeutic milieu .  Remon Quinto, MD 07/02/2016, 2:24 PM

## 2016-07-02 NOTE — Progress Notes (Addendum)
Patient ID: David Daviesaul Glynn Jr., male   DOB: 05/01/1981, 35 y.o.   MRN: [id number] D: Client visible on the unit, seen walking back and forth down the hall. Client attention seeking, making obscene remarks to staff. Client laughing inappropriately and talking to self. Client curses at Clinical research associatewriter, later ask for "can I get my medicine" "I want that antipsychotic medicine, do you have that Zydis, it's soft" mA: Writer reviewed medication, administered as requested. Staff will monitor q2015min for safety. R: client is safe on the unit, did not attend group.

## 2016-07-02 NOTE — Progress Notes (Signed)
[facility] Second Physician Opinion Progress Note for Medication Administration to Non-consenting Patients (For Involuntarily Committed Patients)  Patient: David Daviesaul Culton Jr. Date of Birth: 1980-12-06 MRN: [id number]  Reason for the Medication: The patient, without the benefit of the specific treatment measure, is incapable of participating in any available treatment plan that will give the patient a realistic opportunity of improving the patient's condition. There is, without the benefit of the specific treatment measure, a significant possibility that the patient will harm self or others before improvement of the patient's condition is realized.  Consideration of Side Effects: Consideration of the side effects related to the medication plan has been given.  Rationale for Medication Administration: Pt with history of schizophrenia, has refused all medications and recommended care since 11/20. Exhibits responding to unseen others, paranoia, agitation. Lacks insight into necessity of treatment.  The patient, without the benefit of the specific treatment measure, is incapable of participating in any available treatment plan that will give the patient a realistic opportunity of improving the patient's condition. There is, without the benefit of the specific treatment measure, a significant possibility that the patient will harm self or others before improvement of the patient's condition is realized.  Acquanetta SitElizabeth Woods Oates, MD 07/02/16  11:03 AM   This documentation is good for (7) seven days from the date of the MD signature. New documentation must be completed every seven (7) days with detailed justification in the medical record if the patient requires continued non-emergent administration of psychotropic medications.

## 2016-07-02 NOTE — Progress Notes (Signed)
Patient ID: David Daviesaul Mellin Jr., male   DOB: 06/16/1981, 35 y.o.   MRN: [id number] PER STATE REGULATIONS 482.30  THIS CHART WAS REVIEWED FOR MEDICAL NECESSITY WITH RESPECT TO THE PATIENT'S ADMISSION/ DURATION OF STAY.  NEXT REVIEW DATE: 07/06/2016  Willa RoughJENNIFER JONES Kelton Bultman, RN, BSN CASE MANAGER

## 2016-07-02 NOTE — [facility] Group Notes (Signed)
Pt did not attend wrap up group meeting this evening.  

## 2016-07-02 NOTE — Progress Notes (Signed)
Patient has been refusing all medications since 07/01/2016 @ 1700.  Patient states that he doesn't need the medications that we are giving him.  Patient has also refused EKG, Labs, and PPD placement.

## 2016-07-03 NOTE — Progress Notes (Signed)
D: Patient denies SI/HI and A/V hallucinations;    A: Monitored q 15 minutes; patient encouraged to attend groups; patient educated about medications; patient given medications per physician orders; patient encouraged to express feelings and/or concerns  R: Patient is responding to internal stimuli; patient is pacing; patient is talking into the comb; patient is labile but had taken most of his medications but requires medications; patient's interaction with staff and peers is isolative; patient can be tangential; patient was able to set goal to talk with staff 1:1 when having feelings of SI; patient is taking medications as prescribed and tolerating medications; patient is attending no groups

## 2016-07-03 NOTE — Progress Notes (Signed)
Recreation Therapy Notes  Date: 07/03/16 Time: 1000 Location: 500 Hall Dayroom  Group Topic: Self-Expression  Goal Area(s) Addresses:  Patient will identify things they are thankful for. Patient will verbalize benefit of being thankful.  Intervention: Worksheet with a picture frame, markers  Activity: What I'm Thankful For.  Patients were given a worksheet with the outline of a picture frame.  Patients were to draw, write a poem or list the things they were thankful for.  Education:  Self-Expression, Building control surveyorDischarge Planning.   Education Outcome: Acknowledges education/In group clarification offered/Needs additional education  Clinical Observations/Feedback: Pt did not attend group.   Caroll RancherMarjette Suzann Lazaro, LRT/CTRS      Caroll RancherLindsay, Carlena Ruybal A 07/03/2016 11:23 AM

## 2016-07-03 NOTE — Progress Notes (Signed)
Patient ID: David Daviesaul Sievers Jr., male   DOB: 01/25/1981, 35 y.o.   MRN: [id number] D: client visible on the hall restless, using comb as make believe phone, having a conversation with voices, laughing at times, talking loudly, and using profane language at other times. Client is verbally aggressive towards staff and visitor voiced concerned that he had been verbally aggressive towards her daughter. A: client redirected by staff, writer encouraged medications, client refused, but eventually took medications from another nurse. Staff will monitor q1915min for safety. R: Client is safe on the unit, did not attend group.

## 2016-07-03 NOTE — Progress Notes (Signed)
[facility] MD Progress Note  07/03/2016 11:21 AM Simmie DaviesPaul Aikens Jr.  MRN:  [id number] Subjective:  Pt refused to participate .      Objective:Giancarlo Shirlee LatchMcLean Jr.is a 35 y.o.AA male, who is single, on SSD , who has a hx of schizophrenia and noncompliance with medications , [facility] [place] , [facility] .  Patient seen and chart reviewed.Discussed patient with treatment team.  Pt seen walking in the hallway, several attempts made to evaluate patient, pt is not cooperative. Per RN - pt is selectively mute - was abel to speak to RN this AM as well as noted as interacting with some peers on and off. He continues to be on forced medications order- is only partially compliant on medications - picks and chooses the other PO medications . He continues to be on UR , continues to need support.     Principal Problem: Paranoid schizophrenia (HCC) Diagnosis:   Patient Active Problem List   Diagnosis Date Noted  . Noncompliance with medication regimen [Z91.14] 06/25/2016  . Cocaine use disorder, mild, abuse [F14.10] 06/25/2016  . Cannabis use disorder, mild, abuse [F12.10] 06/25/2016  . Paranoid schizophrenia (HCC) [F20.0] 06/24/2016   Total Time spent with patient: 25 minutes  Past Psychiatric History: Please see H&P.   Past Medical History:  Past Medical History:  Diagnosis Date  . Acute psychosis   . Cannabis abuse    Past  . Paranoid schizophrenia (HCC)     Family History:Please see H&P.  Family Psychiatric  History: unknown - pt unable to discuss , guardian unaware. Social History: pt has legal guardian - [employer] [facility] [place]. History  Alcohol Use No     History  Drug Use  . Types: Cocaine, Marijuana    Social History   Social History  . Marital status: Single    Spouse name: N/A  . Number of children: N/A  . Years of education: N/A   Social History Main Topics  . Smoking status: Current Every Day Smoker    Packs/day: 1.00    Types: Cigarettes  . Smokeless tobacco: Never Used  . Alcohol use No  . Drug use:     Types: Cocaine, Marijuana  . Sexual activity: Not Asked   Other Topics Concern  . None   Social History Narrative  . None   Additional Social History:                         Sleep: observed as fair  Appetite:  Observed as fair  Current Medications: Current Facility-Administered Medications  Medication Dose Route Frequency Provider Last Rate Last Dose  . acetaminophen (TYLENOL) tablet 650 mg  650 mg Oral Q6H PRN Charm RingsJamison Y Lord, NP      . alum & mag hydroxide-simeth (MAALOX/MYLANTA) 200-200-20 MG/5ML suspension 30 mL  30 mL Oral Q4H PRN Charm RingsJamison Y Lord, NP      . atorvastatin (LIPITOR) tablet 10 mg  10 mg Oral q1800 Saverio Kader, MD      . benztropine (COGENTIN) tablet 0.5 mg  0.5 mg Oral Daily Keylie Beavers, MD   0.5 mg at 07/03/16 0800   Or  . benztropine mesylate (COGENTIN) injection 0.5 mg  0.5 mg Intramuscular Daily Eira Alpert, MD      . cyanocobalamin ((VITAMIN B-12)) injection 1,000 mcg  1,000 mcg Intramuscular Once Jomarie LongsSaramma Jael Waldorf, MD      .  hydrOXYzine (ATARAX/VISTARIL) tablet 25 mg  25 mg Oral TID Charm Rings, NP   25 mg at 07/03/16 0800  . lamoTRIgine (LAMICTAL) tablet 50 mg  50 mg Oral BID Adonis Brook, NP   50 mg at 07/01/16 0841  . LORazepam (ATIVAN) tablet 1 mg  1 mg Oral Q6H PRN Jomarie Longs, MD   1 mg at 06/30/16 1131   Or  . LORazepam (ATIVAN) injection 1 mg  1 mg Intramuscular Q6H PRN Lillieanna Tuohy, MD      . LORazepam (ATIVAN) tablet 0.5 mg  0.5 mg Oral BID Orma Cheetham, MD      . magnesium hydroxide (MILK OF MAGNESIA) suspension 30 mL  30 mL Oral Daily PRN Charm Rings, NP      . paliperidone (INVEGA) 24 hr tablet 9 mg  9 mg Oral Daily Jozeph Persing, MD   9 mg at 07/03/16 0800   Or  . OLANZapine (ZYPREXA) injection 10 mg  10 mg Intramuscular Daily Kwanza Cancelliere, MD      . OLANZapine (ZYPREXA) tablet 10 mg  10 mg Oral BID  PRN Jomarie Longs, MD   10 mg at 07/02/16 2115   Or  . OLANZapine (ZYPREXA) injection 10 mg  10 mg Intramuscular BID PRN Jomarie Longs, MD      . traZODone (DESYREL) tablet 150 mg  150 mg Oral QHS Jomarie Longs, MD   150 mg at 06/30/16 2106  . tuberculin injection 5 Units  5 Units Intradermal Once Jomarie Longs, MD        Lab Results: No results found for this or any previous visit (from the past 48 hour(s)).  Blood Alcohol level:  Lab Results  Component Value Date   ETH <5 06/22/2016   ETH <5 03/10/2016    Metabolic Disorder Labs: No results found for: HGBA1C, MPG No results found for: PROLACTIN No results found for: CHOL, TRIG, HDL, CHOLHDL, VLDL, LDLCALC  Physical Findings: AIMS: Facial and Oral Movements Muscles of Facial Expression: None, normal Lips and Perioral Area: None, normal Jaw: None, normal Tongue: None, normal,Extremity Movements Upper (arms, wrists, hands, fingers): None, normal Lower (legs, knees, ankles, toes): None, normal, Trunk Movements Neck, shoulders, hips: None, normal, Overall Severity Severity of abnormal movements (highest score from questions above): None, normal Incapacitation due to abnormal movements: None, normal Patient's awareness of abnormal movements (rate only patient's report): No Awareness, Dental Status Current problems with teeth and/or dentures?: No Does patient usually wear dentures?: No  CIWA:    COWS:     Musculoskeletal: Strength & Muscle Tone: within normal limits Gait & Station: normal Patient leans: N/A  Psychiatric Specialty Exam: Physical Exam  Nursing note and vitals reviewed.   Review of Systems  Unable to perform ROS: Mental acuity    Blood pressure 133/78, pulse 79, temperature 97.7 F (36.5 C), temperature source Oral, resp. rate 16, height 5\' 9"  (1.753 m), weight 59 kg (130 lb).Body mass index is 19.2 kg/m.  General Appearance: Guarded  Eye Contact:  Poor  Speech:  normal rate - but is mute when  Clinical research associate attempted to assess  Volume:  see above  Mood:observed as   does not respond on and off   Affect:  Labile  Thought Process:  Disorganized, Irrelevant and Descriptions of Associations: Circumstantial  Orientation:  Other:  to person  Thought Content:  Delusions, Paranoid Ideation, Rumination and responding to internal stimuli- seen as talking to self   Suicidal Thoughts: did not express any  Homicidal  Thoughts:  did not express any  Memory:  Immediate; grossly intact Recent; poor  Remote;  poor  Judgement:  Impaired  Insight:  Shallow  Psychomotor Activity:  Restlessness  Concentration:  Concentration: Poor and Attention Span: Fair  Recall:  [employer]Fair  Fund of Knowledge:  Poor  Language:  Fair  Akathisia:  No  Handed:  Right  AIMS (if indicated):     Assets:  Social Support  ADL's:  Intact  Cognition:  WNL  Sleep:  Number of Hours: 6     Treatment Plan Summary:patient today continues to be not cooperative when Clinical research associatewriter attempted to evaluate Scientific laboratory technician- writer made several attempts today . Pt is on forced nonemergent medications - started 07/02/16 - valid for 7 days.      Paranoid schizophrenia (HCC) unstable    Will continue today 07/03/16  plan as below except where it is noted.   Daily contact with patient to assess and evaluate symptoms and progress in treatment and Medication management For psychosis: Will continue Invega  9 mg po/  Zyprexa 10 mg IM daily . Will continue Cogentin 0.5 mg po/IM bid for EPS.  For mood lability: Will continue  Lamictal to 50 mg po BID.Pt has been refusing it . Will continue  Ativan 0.5 mg po bid.  For insomina: Will continue Trazodone  150 mg po qhs for sleep.  For agitation/anxiety: Continue PRN medications as per agitation protocol.  For Vitamin b12 deficiency: Continue Vitamin b12 1000 mcg daily po.  Will continue to monitor vitals ,medication compliance and treatment side effects while patient is here.   Will monitor for  medical issues as well as call consult as needed.   Reviewed labs - pt refused-  TSH, Lipid panel, hba1c, pl, vitamin b12.  Pt refused EKG for qtc monitoring- will reorder .  PPD offered- 06/26/16- pt refused.  CSW will continue  working on disposition.[facility]  [facility].  [facility] - I have reviewed it- [facility]. Pt as per records was noncompliant with out patient follow ups or medications zyprexa, lexapro - was discharged from prison in November 2016. Pt also was abusing cocaine, cannabis . He presented with psychosis , mood lability - responded well to invega - was given invega sustenna IM. He was also found to have Vitamin b12 deficiency - was started Vitamin b12 replacement. Pt was discharged to his guardian who found placement. His family hx if unknown , he was adopted as a child, went up to 9 th grade.    Patient to participate in therapeutic milieu .  Kalem Rockwell, MD 07/03/2016, 11:21 AM

## 2016-07-04 DIAGNOSIS — F1210 Cannabis abuse, uncomplicated: Secondary | ICD-10-CM

## 2016-07-04 DIAGNOSIS — F1410 Cocaine abuse, uncomplicated: Secondary | ICD-10-CM

## 2016-07-04 DIAGNOSIS — Z9114 Patient's other noncompliance with medication regimen: Secondary | ICD-10-CM

## 2016-07-04 MED ORDER — OLANZAPINE 10 MG PO TBDP
10.0000 mg | ORAL_TABLET | Freq: Two times a day (BID) | ORAL | Status: DC | PRN
  Administered 2016-07-06 – 2016-07-13 (×2): 10 mg via ORAL
  Filled 2016-07-04 (×2): qty 1

## 2016-07-04 MED ORDER — OLANZAPINE 10 MG PO TBDP
ORAL_TABLET | ORAL | Status: AC
  Filled 2016-07-04: qty 1

## 2016-07-04 MED ORDER — OLANZAPINE 10 MG PO TBDP: 10.0000 mg | ORAL_TABLET | Freq: Two times a day (BID) | ORAL | Status: DC | PRN

## 2016-07-04 MED ORDER — OLANZAPINE 10 MG IM SOLR
10.0000 mg | Freq: Two times a day (BID) | INTRAMUSCULAR | Status: DC | PRN
  Administered 2016-07-08: 10 mg via INTRAMUSCULAR
  Filled 2016-07-04 (×2): qty 10

## 2016-07-04 NOTE — Progress Notes (Signed)
Patient ID: David Daviesaul Hammill Jr., male   DOB: 03/27/1981, 35 y.o.   MRN: [id number]    D: Pt has been very paranoid on the unit today, he has paced the halls just looking at people and talking to himself. Pt appeared to be responding to internal stimuli, and when initially offered his medication he refused. This Clinical research associatewriter did work with patient and was able to get him to take his medication. Pt reported that his depression was a 0, his hopelessness was a 0, and his anxiety was a 0. Pt reported that he had no goal for today and that he just needed to be left alone. This Clinical research associatewriter attempted to talk to patient and to check on him, he reported that he wanted this writer to leave him alone because I was being to nice. Pt reported being negative SI/HI, no AH/VH noted. A: 15 min checks continued for patient safety. R: Pt safety maintained.

## 2016-07-04 NOTE — Progress Notes (Signed)
[facility] MD Progress Note  07/04/2016 12:56 PM David Davila.  MRN:  [id number] Subjective:  Pt rcontinue to be labile per nursing.  Patient refused to make eye contact with this NP.  Then would "snicker" on the side.    Objective:David Davilais a 35 y.o.AA male, who is single, on SSD , who has a hx of schizophrenia and noncompliance with medications , used to live at a [facility] in [place] , was brought in by [employer] to [facility] from a night club for disorganized behavior .  Patient seen and chart reviewed.Discussed patient with treatment team.  Pt seen walking in the hallway, several attempts made to evaluate patient, pt is not cooperative. Per nursing - pt is selectively mute.  Then he would sing in the hallway. He continues to be on forced medications order-today he is agreeable and did take his meds orally. He continues to be on UR, continues to need support.  Principal Problem: Paranoid schizophrenia (HCC) Diagnosis:   Patient Active Problem List   Diagnosis Date Noted  . Noncompliance with medication regimen [Z91.14] 06/25/2016  . Cocaine use disorder, mild, abuse [F14.10] 06/25/2016  . Cannabis use disorder, mild, abuse [F12.10] 06/25/2016  . Paranoid schizophrenia (HCC) [F20.0] 06/24/2016   Total Time spent with patient: 25 minutes  Past Psychiatric History: Please see H&P.   Past Medical History:  Past Medical History:  Diagnosis Date  . Acute psychosis   . Cannabis abuse    Past  . Paranoid schizophrenia (HCC)     Family History:Please see H&P.  Family Psychiatric  History: unknown - pt unable to discuss , guardian unaware. Social History: pt has legal guardian - [employer]. History  Alcohol Use No     History  Drug Use  . Types: Cocaine, Marijuana    Social History   Social History  . Marital status: Single    Spouse name: N/A  . Number of children: N/A  . Years of education: N/A   Social History Main Topics  . Smoking status: Current Every Day Smoker   Packs/day: 1.00    Types: Cigarettes  . Smokeless tobacco: Never Used  . Alcohol use No  . Drug use:     Types: Cocaine, Marijuana  . Sexual activity: Not Asked   Other Topics Concern  . None   Social History Narrative  . None   Additional Social History:                         Sleep: observed as fair  Appetite:  Observed as fair  Current Medications: Current Facility-Administered Medications  Medication Dose Route Frequency Provider Last Rate Last Dose  . acetaminophen (TYLENOL) tablet 650 mg  650 mg Oral Q6H PRN David Rings, NP      . alum & mag hydroxide-simeth (MAALOX/MYLANTA) 200-200-20 MG/5ML suspension 30 mL  30 mL Oral Q4H PRN David Rings, NP      . atorvastatin (LIPITOR) tablet 10 mg  10 mg Oral q1800 David Longs, MD   10 mg at 07/03/16 1709  . benztropine (COGENTIN) tablet 0.5 mg  0.5 mg Oral Daily David Eappen, MD   0.5 mg at 07/04/16 0740   Or  . benztropine mesylate (COGENTIN) injection 0.5 mg  0.5 mg Intramuscular Daily David Eappen, MD      . cyanocobalamin ((VITAMIN B-12)) injection 1,000 mcg  1,000 mcg Intramuscular Once David Longs, MD      . hydrOXYzine (ATARAX/VISTARIL)  tablet 25 mg  25 mg Oral TID David RingsJamison Y Lord, NP   25 mg at 07/04/16 1200  . lamoTRIgine (LAMICTAL) tablet 50 mg  50 mg Oral BID David BrookSheila Antjuan Rothe, NP   50 mg at 07/04/16 0740  . LORazepam (ATIVAN) tablet 1 mg  1 mg Oral Q6H PRN David LongsSaramma Eappen, MD   1 mg at 06/30/16 1131   Or  . LORazepam (ATIVAN) injection 1 mg  1 mg Intramuscular Q6H PRN David LongsSaramma Eappen, MD      . LORazepam (ATIVAN) tablet 0.5 mg  0.5 mg Oral BID David LongsSaramma Eappen, MD   0.5 mg at 07/04/16 0740  . magnesium hydroxide (MILK OF MAGNESIA) suspension 30 mL  30 mL Oral Daily PRN David RingsJamison Y Lord, NP      . paliperidone (INVEGA) 24 hr tablet 9 mg  9 mg Oral Daily David LongsSaramma Eappen, MD   9 mg at 07/04/16 0740   Or  . OLANZapine (ZYPREXA) injection 10 mg  10 mg Intramuscular Daily David Eappen, MD      . OLANZapine  (ZYPREXA) tablet 10 mg  10 mg Oral BID PRN David LongsSaramma Eappen, MD   10 mg at 07/03/16 2256   Or  . OLANZapine (ZYPREXA) injection 10 mg  10 mg Intramuscular BID PRN David LongsSaramma Eappen, MD      . traZODone (DESYREL) tablet 150 mg  150 mg Oral QHS David LongsSaramma Eappen, MD   150 mg at 07/03/16 2237  . tuberculin injection 5 Units  5 Units Intradermal Once David LongsSaramma Eappen, MD        Lab Results: No results found for this or any previous visit (from the past 48 hour(s)).  Blood Alcohol level:  Lab Results  Component Value Date   ETH <5 06/22/2016   ETH <5 03/10/2016    Metabolic Disorder Labs: No results found for: HGBA1C, MPG No results found for: PROLACTIN No results found for: CHOL, TRIG, HDL, CHOLHDL, VLDL, LDLCALC  Physical Findings: AIMS: Facial and Oral Movements Muscles of Facial Expression: None, normal Lips and Perioral Area: None, normal Jaw: None, normal Tongue: None, normal,Extremity Movements Upper (arms, wrists, hands, fingers): None, normal Lower (legs, knees, ankles, toes): None, normal, Trunk Movements Neck, shoulders, hips: None, normal, Overall Severity Severity of abnormal movements (highest score from questions above): None, normal Incapacitation due to abnormal movements: None, normal Patient's awareness of abnormal movements (rate only patient's report): No Awareness, Dental Status Current problems with teeth and/or dentures?: No Does patient usually wear dentures?: No  CIWA:    COWS:     Musculoskeletal: Strength & Muscle Tone: within normal limits Gait & Station: normal Patient leans: N/A  Psychiatric Specialty Exam: Physical Exam  Nursing note and vitals reviewed.   Review of Systems  Unable to perform ROS: Mental acuity    Blood pressure 132/76, pulse 68, temperature 98.8 F (37.1 C), resp. rate 18, height 5\' 9"  (1.753 m), weight 59 kg (130 lb).Body mass index is 19.2 kg/m.  General Appearance: Guarded  Eye Contact:  Poor  Speech:  normal rate - but is  mute when Clinical research associatewriter attempted to assess  Volume:  see above  Mood:observed as   does not respond on and off   Affect:  Labile  Thought Process:  Disorganized, Irrelevant and Descriptions of Associations: Circumstantial  Orientation:  Other:  to person  Thought Content:  Delusions, Paranoid Ideation, Rumination and responding to internal stimuli- seen as talking to self   Suicidal Thoughts: did not express any  Homicidal Thoughts:  did not express any  Memory:  Immediate; grossly intact Recent; poor  Remote;  poor  Judgement:  Impaired  Insight:  Shallow  Psychomotor Activity:  Restlessness  Concentration:  Concentration: Poor and Attention Span: Fair  Recall:  [employer]Fair  Fund of Knowledge:  Poor  Language:  Fair  Akathisia:  No  Handed:  Right  AIMS (if indicated):     Assets:  Social Support  ADL's:  Intact  Cognition:  WNL  Sleep:  Number of Hours: 4   Treatment Plan Summary:patient today continues to be not cooperative when Clinical research associatewriter attempted to evaluate Scientific laboratory technician- writer made several attempts today . Pt is on forced nonemergent medications - started 07/02/16 - valid for 7 days.  Paranoid schizophrenia (HCC) unstable    Will continue today 07/04/16  plan as below except where it is noted.   Daily contact with patient to assess and evaluate symptoms and progress in treatment and Medication management For psychosis: Will continue Invega  9 mg po/  Zyprexa 10 mg IM daily . Will continue Cogentin 0.5 mg po/IM bid for EPS.  For mood lability: Will continue  Lamictal to 50 mg po BID.Pt has been refusing it . Will continue  Ativan 0.5 mg po bid.  For insomina: Will continue Trazodone  150 mg po qhs for sleep.  For agitation/anxiety: Continue PRN medications as per agitation protocol.  For Vitamin b12 deficiency: Continue Vitamin b12 1000 mcg daily po.  Will continue to monitor vitals ,medication compliance and treatment side effects while patient is here.   Will monitor for  medical issues as well as call consult as needed.   Reviewed labs - pt refused-  TSH, Lipid panel, hba1c, pl, vitamin b12.  Pt refused EKG for qtc monitoring- will reorder .  PPD offered- 06/26/16- pt refused.  CSW will continue  working on disposition.[facility].  [facility] - I have reviewed it- [facility]01/29/16. Pt as per records was noncompliant with out patient follow ups or medications zyprexa, lexapro - was [facility] from prison in November 2016. Pt also was abusing cocaine, cannabis . He presented with psychosis , mood lability - responded well to invega - was given invega sustenna IM. He was also found to have Vitamin b12 deficiency - was started Vitamin b12 replacement. Pt was discharged to his guardian who found placement. His family hx if unknown , he was adopted as a child, went up to 9 th grade.    Patient to participate in therapeutic milieu.  David QuaSheila May Skyanne Welle, NP [facility] 07/04/2016, 12:56 PM

## 2016-07-04 NOTE — Progress Notes (Signed)
D: Pt denies SI/HI/AVH. Pt is pacing the halls, actively responding to internal stimuli. Pt stated the only thing he takes is Zyprexa Zydis. Pt stated he can check him self out anytime he wants. Pt was seen standing at the door acting like he was going to leave. Writer got pt medication changed to Zydis after stating that is all he takes. Pt came to the medication window put it in his had and stated that was not what he takes.     A: Pt was offered support and encouragement. Pt was given scheduled medications. Pt was encourage to attend groups. Q 15 minute checks were done for safety.   R: safety maintained on unit.

## 2016-07-04 NOTE — Progress Notes (Signed)
Did not attend group 

## 2016-07-05 NOTE — Plan of Care (Signed)
Problem: Coping: Goal: Ability to cope will improve Outcome: Not Progressing Pt continues to actively be responding to internal stimuli AEB pt having conversations with people not seen by Clinical research associatewriter

## 2016-07-05 NOTE — [facility] Group Notes (Signed)
[facility] LCSW Group Therapy  07/05/2016  1:05 PM  Type of Therapy:  Group therapy  Participation Level:  Active  Participation Quality:  Attentive  Affect:  Flat  Cognitive:  Oriented  Insight:  Limited  Engagement in Therapy:  Limited  Modes of Intervention:  Discussion, Socialization  Summary of Progress/Problems:  Chaplain was here to lead a group on themes of hope and courage. Attended briefly due to the encouragement of another patient.  Refused to speak. Brought a bible and offered it to another patient on his way out the door.  Did not return.  Daryel Geraldorth, Solmon Bohr B 07/05/2016 11:37 AM
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
## 2016-07-05 NOTE — Progress Notes (Signed)
Patient ID: David Daviesaul Appelbaum Jr., male   DOB: 05/09/1981, 35 y.o.   MRN: [id number]    D: Pt has been very paranoid on the unit today, he has paced the halls just looking at people and talking to himself. Pt appeared to be responding to internal stimuli, and when initially offered his medication he refused. This Clinical research associatewriter did work with patient and was able to get him to take his medication. Pt reported that his depression was a 10, his hopelessness was a 0, and his anxiety was a 0. Pt reported that his goal for today was to have perfect understanding.  Pt reported being negative SI/HI, no AH/VH noted. A: 15 min checks continued for patient safety. R: Pt safety maintained.

## 2016-07-05 NOTE — Progress Notes (Signed)
[facility] Progress [facility]  07/05/2016 1:23 PM David DaviesPaul Hankinson Jr.  MRN:  [id number] Subjective:  Pt rcontinue to be labile per nursing.  Patient nodded head after being greeted.  Non verbal with this NP.    Objective:  David Davilais a 35 y.o.AA male, who is single, on SSD , who has a hx of schizophrenia and noncompliance with medications , [facility] [place] , [facility] .  Patient seen and chart reviewed.Discussed patient with treatment team.  Pt seen walking in the hallway, several attempts made to evaluate patient, pt is not talking. Per nursing - pt is selectively mute.  Then he would sing in the hallway.  Talking with patients but not to all.  Not with this NP. He continues to be on forced medications order-today he is agreeable and did take his meds orally. He continues to be on UR, continues to need support.  Principal Problem: Paranoid schizophrenia (HCC) Diagnosis:   Patient Active Problem List   Diagnosis Date Noted  . Noncompliance with medication regimen [Z91.14] 06/25/2016  . Cocaine use disorder, mild, abuse [F14.10] 06/25/2016  . Cannabis use disorder, mild, abuse [F12.10] 06/25/2016  . Paranoid schizophrenia (HCC) [F20.0] 06/24/2016   Total Time spent with patient: 25 minutes  Past Psychiatric History: Please see H&P.   Past Medical History:  Past Medical History:  Diagnosis Date  . Acute psychosis   . Cannabis abuse    Past  . Paranoid schizophrenia (HCC)     Family History:Please see H&P.  Family Psychiatric  History: unknown - pt unable to discuss , guardian unaware. Social History: pt has legal guardian - [employer]. History  Alcohol Use No     History  Drug Use  . Types: Cocaine, Marijuana    Social History   Social History  . Marital status: Single    Spouse name: N/A  . Number of children: N/A  . Years of education: N/A   Social History Main Topics  . Smoking  status: Current Every Day Smoker    Packs/day: 1.00    Types: Cigarettes  . Smokeless tobacco: Never Used  . Alcohol use No  . Drug use:     Types: Cocaine, Marijuana  . Sexual activity: Not Asked   Other Topics Concern  . None   Social History Narrative  . None   Additional Social History:                         Sleep: observed as fair  Appetite:  Observed as fair  Current Medications: Current Facility-Administered Medications  Medication Dose Route Frequency Provider Last Rate Last Dose  . acetaminophen (TYLENOL) tablet 650 mg  650 mg Oral Q6H PRN Charm RingsJamison Y Lord, NP      . alum & mag hydroxide-simeth (MAALOX/MYLANTA) 200-200-20 MG/5ML suspension 30 mL  30 mL Oral Q4H PRN Charm RingsJamison Y Lord, NP      . atorvastatin (LIPITOR) tablet 10 mg  10 mg Oral q1800 Jomarie LongsSaramma Eappen, MD   10 mg at 07/04/16 1646  . benztropine (COGENTIN) tablet 0.5 mg  0.5 mg Oral Daily Saramma Eappen, MD   0.5 mg at 07/05/16 0748   Or  . benztropine mesylate (COGENTIN) injection 0.5 mg  0.5 mg Intramuscular Daily Saramma Eappen, MD      . cyanocobalamin ((VITAMIN B-12)) injection 1,000 mcg  1,000 mcg  Intramuscular Once Jomarie LongsSaramma Eappen, MD      . hydrOXYzine (ATARAX/VISTARIL) tablet 25 mg  25 mg Oral TID Charm RingsJamison Y Lord, NP   25 mg at 07/05/16 1211  . lamoTRIgine (LAMICTAL) tablet 50 mg  50 mg Oral BID Adonis BrookSheila Kamau Weatherall, NP   50 mg at 07/05/16 0749  . LORazepam (ATIVAN) tablet 1 mg  1 mg Oral Q6H PRN Jomarie LongsSaramma Eappen, MD   1 mg at 06/30/16 1131   Or  . LORazepam (ATIVAN) injection 1 mg  1 mg Intramuscular Q6H PRN Jomarie LongsSaramma Eappen, MD      . LORazepam (ATIVAN) tablet 0.5 mg  0.5 mg Oral BID Jomarie LongsSaramma Eappen, MD   0.5 mg at 07/05/16 0749  . magnesium hydroxide (MILK OF MAGNESIA) suspension 30 mL  30 mL Oral Daily PRN Charm RingsJamison Y Lord, NP      . paliperidone (INVEGA) 24 hr tablet 9 mg  9 mg Oral Daily Jomarie LongsSaramma Eappen, MD   9 mg at 07/05/16 0749   Or  . OLANZapine (ZYPREXA) injection 10 mg  10 mg Intramuscular Daily  Saramma Eappen, MD      . OLANZapine zydis (ZYPREXA) disintegrating tablet 10 mg  10 mg Oral BID PRN Jackelyn PolingJason A Berry, NP       Or  . OLANZapine (ZYPREXA) injection 10 mg  10 mg Intramuscular BID PRN Jackelyn PolingJason A Berry, NP      . traZODone (DESYREL) tablet 150 mg  150 mg Oral QHS Jomarie LongsSaramma Eappen, MD   150 mg at 07/03/16 2237  . tuberculin injection 5 Units  5 Units Intradermal Once Jomarie LongsSaramma Eappen, MD        Lab Results: No results found for this or any previous visit (from the past 48 hour(s)).  Blood Alcohol level:  Lab Results  Component Value Date   ETH <5 06/22/2016   ETH <5 03/10/2016    Metabolic Disorder Labs: No results found for: HGBA1C, MPG No results found for: PROLACTIN No results found for: CHOL, TRIG, HDL, CHOLHDL, VLDL, LDLCALC  Physical Findings: AIMS: Facial and Oral Movements Muscles of Facial Expression: None, normal Lips and Perioral Area: None, normal Jaw: None, normal Tongue: None, normal,Extremity Movements Upper (arms, wrists, hands, fingers): None, normal Lower (legs, knees, ankles, toes): None, normal, Trunk Movements Neck, shoulders, hips: None, normal, Overall Severity Severity of abnormal movements (highest score from questions above): None, normal Incapacitation due to abnormal movements: None, normal Patient's awareness of abnormal movements (rate only patient's report): No Awareness, Dental Status Current problems with teeth and/or dentures?: No Does patient usually wear dentures?: No  CIWA:    COWS:     Musculoskeletal: Strength & Muscle Tone: within normal limits Gait & Station: normal Patient leans: N/A  Psychiatric Specialty Exam: Physical Exam  Nursing note and vitals reviewed.   Review of Systems  Unable to perform ROS: Mental acuity    Blood pressure 132/76, pulse 68, temperature 98.8 F (37.1 C), resp. rate 18, height 5\' 9"  (1.753 m), weight 59 kg (130 lb).Body mass index is 19.2 kg/m.  General Appearance: Guarded  Eye Contact:   Poor  Speech:  normal rate - but is mute when Clinical research associatewriter attempted to assess  Volume:  see above  Mood:observed as   does not respond on and off   Affect:  Labile  Thought Process:  Disorganized, Irrelevant and Descriptions of Associations: Circumstantial  Orientation:  Other:  to person  Thought Content:  Delusions, Paranoid Ideation, Rumination and responding to internal stimuli- seen as talking  to self   Suicidal Thoughts: did not express any  Homicidal Thoughts:  did not express any  Memory:  Immediate; grossly intact Recent; poor  Remote;  poor  Judgement:  Impaired  Insight:  Shallow  Psychomotor Activity:  Restlessness  Concentration:  Concentration: Poor and Attention Span: Fair  Recall:  [employer] of Knowledge:  Poor  Language:  Fair  Akathisia:  No  Handed:  Right  AIMS (if indicated):     Assets:  Social Support  ADL's:  Intact  Cognition:  WNL  Sleep:  Number of Hours: 6.25   Treatment Plan Summary:patient today continues to be not cooperative when Clinical research associate attempted to evaluate Scientific laboratory technician made several attempts today . Pt is on forced nonemergent medications - started 07/02/16 - valid for 7 days.  Paranoid schizophrenia (HCC) unstable    Will continue today 07/05/16  plan as below except where it is noted.   Daily contact with patient to assess and evaluate symptoms and progress in treatment and Medication management For psychosis: Will continue Invega  9 mg po/  Zyprexa 10 mg IM daily . Will continue Cogentin 0.5 mg po/IM bid for EPS.  For mood lability: Will continue  Lamictal to 50 mg po BID.Pt has been refusing it . Will continue  Ativan 0.5 mg po bid.  For insomina: Will continue Trazodone  150 mg po qhs for sleep.  For agitation/anxiety: Continue PRN medications as per agitation protocol.  For Vitamin b12 deficiency: Continue Vitamin b12 1000 mcg daily po.  Will continue to monitor vitals ,medication compliance and treatment side effects while  patient is here.   Will monitor for medical issues as well as call consult as needed.   Reviewed labs - pt refused-  TSH, Lipid panel, hba1c, pl, vitamin b12.  Pt refused EKG for qtc monitoring- will reorder .  PPD offered- 06/26/16- pt refused.  CSW will continue  working on disposition.Patient  referred to [facility] for higher level of care.  Obtained medical records from [facility]- 07/02/16 - I have reviewed it- patient was admitted at [facility] 01/02/16-01/29/16. Pt as per records was noncompliant with out patient follow ups or medications zyprexa, lexapro - was discharged from prison in November 2016. Pt also was abusing cocaine, cannabis . He presented with psychosis , mood lability - responded well to invega - was given invega sustenna IM. He was also found to have Vitamin b12 deficiency - was started Vitamin b12 replacement. Pt was discharged to his guardian who found placement. His family hx if unknown , he was adopted as a child, went up to 9 th grade.    Patient to participate in therapeutic milieu.  Lindwood Qua, NP [facility] 07/05/2016, 1:23 PM

## 2016-07-05 NOTE — Progress Notes (Signed)
Recreation Therapy Notes  Date: 07/05/16 Time: 1000 Location: 500 Hall Dayroom  Group Topic: Communication, Team Building, Problem Solving  Goal Area(s) Addresses:  Patient will effectively work with peer towards shared goal.  Patient will identify skills used to make activity successful.  Patient will identify how skills used during activity can be used to reach post d/c goals.   Intervention: STEM Activity  Activity: Stage managerLanding Pad. In teams patients were given 12 plastic drinking straws and a length of masking tape. Using the materials provided patients were asked to build a landing pad to catch a golf ball dropped from approximately 6 feet in the air.   Education: Pharmacist, communityocial Skills, Discharge Planning   Education Outcome: Acknowledges education/In group clarification offered/Needs additional education.   Clinical Observations/Feedback: Pt did not attend group.   Caroll RancherMarjette Zaakirah Kistner, LRT/CTRS      Caroll RancherLindsay, Dontell Mian A 07/05/2016 12:26 PM

## 2016-07-05 NOTE — Plan of Care (Signed)
Problem: Safety: Goal: Periods of time without injury will increase Outcome: Progressing Pt safe on the unit at this time   

## 2016-07-05 NOTE — Tx Team (Signed)
Interdisciplinary Treatment and Diagnostic Plan Update  07/05/2016 Time of Session: 11:37 AM  Shon Hough. MRN: [id number]  Principal Diagnosis: Paranoid schizophrenia ([place])  Secondary Diagnoses: Principal Problem:   Paranoid schizophrenia ([place]) Active Problems:   Noncompliance with medication regimen   Cocaine use disorder, mild, abuse   Cannabis use disorder, mild, abuse   Current Medications:  Current Facility-Administered Medications  Medication Dose Route Frequency Provider Last Rate Last Dose  . acetaminophen (TYLENOL) tablet 650 mg  650 mg Oral Q6H PRN Patrecia Pour, NP      . alum & mag hydroxide-simeth (MAALOX/MYLANTA) 200-200-20 MG/5ML suspension 30 mL  30 mL Oral Q4H PRN Patrecia Pour, NP      . atorvastatin (LIPITOR) tablet 10 mg  10 mg Oral q1800 Ursula Alert, MD   10 mg at 07/04/16 1646  . benztropine (COGENTIN) tablet 0.5 mg  0.5 mg Oral Daily Saramma Eappen, MD   0.5 mg at 07/05/16 0748   Or  . benztropine mesylate (COGENTIN) injection 0.5 mg  0.5 mg Intramuscular Daily Saramma Eappen, MD      . cyanocobalamin ((VITAMIN B-12)) injection 1,000 mcg  1,000 mcg Intramuscular Once Ursula Alert, MD      . hydrOXYzine (ATARAX/VISTARIL) tablet 25 mg  25 mg Oral TID Patrecia Pour, NP   25 mg at 07/05/16 0748  . lamoTRIgine (LAMICTAL) tablet 50 mg  50 mg Oral BID Kerrie Buffalo, NP   50 mg at 07/05/16 0749  . LORazepam (ATIVAN) tablet 1 mg  1 mg Oral Q6H PRN Ursula Alert, MD   1 mg at 06/30/16 1131   Or  . LORazepam (ATIVAN) injection 1 mg  1 mg Intramuscular Q6H PRN Ursula Alert, MD      . LORazepam (ATIVAN) tablet 0.5 mg  0.5 mg Oral BID Ursula Alert, MD   0.5 mg at 07/05/16 0749  . magnesium hydroxide (MILK OF MAGNESIA) suspension 30 mL  30 mL Oral Daily PRN Patrecia Pour, NP      . paliperidone (INVEGA) 24 hr tablet 9 mg  9 mg Oral Daily Ursula Alert, MD   9 mg at 07/05/16 0749   Or  . OLANZapine (ZYPREXA) injection 10 mg  10 mg Intramuscular Daily  Saramma Eappen, MD      . OLANZapine zydis (ZYPREXA) disintegrating tablet 10 mg  10 mg Oral BID PRN Rozetta Nunnery, NP       Or  . OLANZapine (ZYPREXA) injection 10 mg  10 mg Intramuscular BID PRN Rozetta Nunnery, NP      . traZODone (DESYREL) tablet 150 mg  150 mg Oral QHS Ursula Alert, MD   150 mg at 07/03/16 2237  . tuberculin injection 5 Units  5 Units Intradermal Once Ursula Alert, MD        PTA Medications: Prescriptions Prior to Admission  Medication Sig Dispense Refill Last Dose  . atorvastatin (LIPITOR) 10 MG tablet Take 10 mg by mouth at bedtime.   03/09/2016 at Unknown time  . cyanocobalamin (,VITAMIN B-12,) 1000 MCG/ML injection Inject 1,000 mcg into the muscle every 30 (thirty) days.   Past Month at Unknown time  . lamoTRIgine (LAMICTAL) 100 MG tablet Take 100 mg by mouth 2 (two) times daily.   03/09/2016 at Unknown time  . LORazepam (ATIVAN) 0.5 MG tablet Take 0.5 mg by mouth every 6 (six) hours as needed for anxiety.   03/09/2016 at Unknown time    Treatment Modalities: Medication Management, Group therapy, Case management,  1 to 1 session with clinician, Psychoeducation, Recreational therapy.   Physician Treatment Plan for Primary Diagnosis: Paranoid schizophrenia ([place]) Long Term Goal(s): Improvement in symptoms so as ready for discharge  Short Term Goals: Compliance with prescribed medications will improve  Medication Management: Evaluate patient's response, side effects, and tolerance of medication regimen.  Therapeutic Interventions: 1 to 1 sessions, Unit Group sessions and Medication administration.  Evaluation of Outcomes: Not Met  11/20::Patient today seen as  delusional ,disorganized , and labile.  Will continue to readjust medications. Will cross taper Haldol with Invega for psychosis . Haldol likely not effective . Will increase Invega to 6 mg po qhs. Will continue Cogentin 0.5 mg po bid for EPS.  For mood lability: Increased Lamictal to 25 mg po  BID.  11/24: :patient today continues to be not cooperative when Probation officer attempted to evaluate Water quality scientist made several attempts today .  Paranoid schizophrenia ([place]) unstable  For psychosis: Will continue Invega  9 mg po/  Zyprexa 10 mg IM daily . Will continue Cogentin 0.5 mg po/IM bid for EPS.  For mood lability: Will continue  Lamictal to 50 mg po BID.Pt has been refusing it . Will continue  Ativan 0.5 mg po bid.   Physician Treatment Plan for Secondary Diagnosis: Principal Problem:   Paranoid schizophrenia ([place]) Active Problems:   Noncompliance with medication regimen   Cocaine use disorder, mild, abuse   Cannabis use disorder, mild, abuse   Long Term Goal(s): Improvement in symptoms so as ready for discharge  Short Term Goals: Ability to verbalize feelings will improve  Medication Management: Evaluate patient's response, side effects, and tolerance of medication regimen.  Therapeutic Interventions: 1 to 1 sessions, Unit Group sessions and Medication administration.  Evaluation of Outcomes: Progressing   RN Treatment Plan for Primary Diagnosis: Paranoid schizophrenia ([place]) Long Term Goal(s): Knowledge of disease and therapeutic regimen to maintain health will improve  Short Term Goals: Ability to identify and develop effective coping behaviors will improve and Compliance with prescribed medications will improve  Medication Management: RN will administer medications as ordered by provider, will assess and evaluate patient's response and provide education to patient for prescribed medication. RN will report any adverse and/or side effects to prescribing provider.  Therapeutic Interventions: 1 on 1 counseling sessions, Psychoeducation, Medication administration, Evaluate responses to treatment, Monitor vital signs and CBGs as ordered, Perform/monitor CIWA, COWS, AIMS and Fall Risk screenings as ordered, Perform wound care treatments as ordered.  Evaluation of Outcomes:  Progressing   LCSW Treatment Plan for Primary Diagnosis: Paranoid schizophrenia ([place]) Long Term Goal(s): Safe transition to appropriate next level of care at discharge, Engage patient in therapeutic group addressing interpersonal concerns.  Short Term Goals: Engage patient in aftercare planning with referrals and resources  Therapeutic Interventions: Assess for all discharge needs, 1 to 1 time with Social worker, Explore available resources and support systems, Assess for adequacy in community support network, Educate family and significant other(s) on suicide prevention, Complete Psychosocial Assessment, Interpersonal group therapy.  Evaluation of Outcomes: Met  Have spoken with guardian, Chapman Moss, [phone]] [phone] who is attempting to find placement for patient.  In the meantime, pt will be [facility] on [facility] wait list. 11/20:  Pt on [place] wait list.  [facility] has come to do evaluation.  Pt refused PPD-will need to get chest xray.  Guardian sent back signed releases today so we can try to access records at previous hospitalizations 11/24:  Orinda visited on Wed.  Stated that patient  is not at baseline-was talking to guardian about non-existent people and situations that did not occur.   Progress in Treatment: Attending groups: Yes Participating in groups: Yes Taking medication as prescribed: Yes Toleration medication: Yes, no side effects reported at this time Family/Significant other contact made: Yes  Guardian Patient understands diagnosis: No  Limited insight Discussing patient identified problems/goals with staff: Yes Medical problems stabilized or resolved: Yes Denies suicidal/homicidal ideation: Yes Issues/concerns per patient self-inventory: None Other: N/A  New problem(s) identified: None identified at this time.   New Short Term/Long Term Goal(s): None identified at this time.   Discharge Plan or Barriers:   Reason for Continuation of  Hospitalization: Paranoia Disorganization Medication stabilization   Estimated Length of Stay: 3-5 days  Attendees: Patient: 07/05/2016  11:37 AM  Physician: Ursula Alert, MD 07/05/2016  11:37 AM  Nursing: Hedy Jacob RN 07/05/2016  11:37 AM  RN Care Manager: Lars Pinks, RN 07/05/2016  11:37 AM  Social Worker: Ripley Fraise 07/05/2016  11:37 AM  Recreational Therapist: [place] Cunas 07/05/2016  11:37 AM  Other: Norberto Sorenson 07/05/2016  11:37 AM  Other:  07/05/2016  11:37 AM    Scribe for Treatment Team:  Roque Lias 07/05/2016 11:37 AM

## 2016-07-06 NOTE — Progress Notes (Signed)
[facility]  07/06/2016 11:14 AM David DaviesPaul Kruer Jr.  MRN:  [id number] Subjective:  Pt continues to be labile per nursing.  Preoccupied.  He states that the red colored Gatorade was "blood" and that he dis not want to drink blood.  Objective:  David Davilais a 35 y.o.AA male, who is single, on SSD , who has a hx of schizophrenia and noncompliance with medications , [facility] [place] , [facility] .  Patient seen and chart reviewed.Discussed patient with treatment team.  Pt seen walking in the hallway, several attempts made to evaluate patient, pt is not talking. Per nursing - pt is selectively mute.   Talking with patients but not to all.   He continues to be on forced medications order-today he is agreeable and did take his meds orally. He continues to be on UR, continues to need support.  Principal Problem: Paranoid schizophrenia (HCC) Diagnosis:   Patient Active Problem List   Diagnosis Date Noted  . Noncompliance with medication regimen [Z91.14] 06/25/2016  . Cocaine use disorder, mild, abuse [F14.10] 06/25/2016  . Cannabis use disorder, mild, abuse [F12.10] 06/25/2016  . Paranoid schizophrenia (HCC) [F20.0] 06/24/2016   Total Time spent with patient: 25 minutes  Past Psychiatric History: Please see H&P.   Past Medical History:  Past Medical History:  Diagnosis Date  . Acute psychosis   . Cannabis abuse    Past  . Paranoid schizophrenia (HCC)     Family History:Please see H&P.  Family Psychiatric  History: unknown - pt unable to discuss , guardian unaware. Social History: pt has legal guardian - [facility] [facility] [place]. History  Alcohol Use No     History  Drug Use  . Types: Cocaine, Marijuana    Social History   Social History  . Marital status: Single    Spouse name: N/A  . Number of children: N/A  . Years of education: N/A   Social History Main Topics  . Smoking status: Current  Every Day Smoker    Packs/day: 1.00    Types: Cigarettes  . Smokeless tobacco: Never Used  . Alcohol use No  . Drug use:     Types: Cocaine, Marijuana  . Sexual activity: Not Asked   Other Topics Concern  . None   Social History Narrative  . None   Additional Social History:                         Sleep: observed as fair  Appetite:  Observed as fair  Current Medications: Current Facility-Administered Medications  Medication Dose Route Frequency Provider Last Rate Last Dose  . acetaminophen (TYLENOL) tablet 650 mg  650 mg Oral Q6H PRN Charm RingsJamison Y Lord, NP      . alum & mag hydroxide-simeth (MAALOX/MYLANTA) 200-200-20 MG/5ML suspension 30 mL  30 mL Oral Q4H PRN Charm RingsJamison Y Lord, NP      . atorvastatin (LIPITOR) tablet 10 mg  10 mg Oral q1800 Jomarie LongsSaramma Eappen, MD   10 mg at 07/05/16 1623  . benztropine (COGENTIN) tablet 0.5 mg  0.5 mg Oral Daily Saramma Eappen, MD   0.5 mg at 07/06/16 0758   Or  . benztropine mesylate (COGENTIN) injection 0.5 mg  0.5 mg Intramuscular Daily Saramma Eappen, MD      . cyanocobalamin ((VITAMIN B-12)) injection 1,000 mcg  1,000 mcg Intramuscular Once Saramma Eappen,  MD      . hydrOXYzine (ATARAX/VISTARIL) tablet 25 mg  25 mg Oral TID Charm Rings, NP   25 mg at 07/06/16 0758  . lamoTRIgine (LAMICTAL) tablet 50 mg  50 mg Oral BID Adonis Brook, NP   50 mg at 07/06/16 0758  . LORazepam (ATIVAN) tablet 1 mg  1 mg Oral Q6H PRN Jomarie Longs, MD   1 mg at 06/30/16 1131   Or  . LORazepam (ATIVAN) injection 1 mg  1 mg Intramuscular Q6H PRN Jomarie Longs, MD      . LORazepam (ATIVAN) tablet 0.5 mg  0.5 mg Oral BID Jomarie Longs, MD   0.5 mg at 07/06/16 0759  . magnesium hydroxide (MILK OF MAGNESIA) suspension 30 mL  30 mL Oral Daily PRN Charm Rings, NP      . paliperidone (INVEGA) 24 hr tablet 9 mg  9 mg Oral Daily Jomarie Longs, MD   9 mg at 07/06/16 0759   Or  . OLANZapine (ZYPREXA) injection 10 mg  10 mg Intramuscular Daily Saramma Eappen,  MD      . OLANZapine zydis (ZYPREXA) disintegrating tablet 10 mg  10 mg Oral BID PRN Jackelyn Poling, NP       Or  . OLANZapine (ZYPREXA) injection 10 mg  10 mg Intramuscular BID PRN Jackelyn Poling, NP      . traZODone (DESYREL) tablet 150 mg  150 mg Oral QHS Jomarie Longs, MD   150 mg at 07/03/16 2237  . tuberculin injection 5 Units  5 Units Intradermal Once Jomarie Longs, MD        Lab Results: No results found for this or any previous visit (from the past 48 hour(s)).  Blood Alcohol level:  Lab Results  Component Value Date   ETH <5 06/22/2016   ETH <5 03/10/2016    Metabolic Disorder Labs: No results found for: HGBA1C, MPG No results found for: PROLACTIN No results found for: CHOL, TRIG, HDL, CHOLHDL, VLDL, LDLCALC  Physical Findings: AIMS: Facial and Oral Movements Muscles of Facial Expression: None, normal Lips and Perioral Area: None, normal Jaw: None, normal Tongue: None, normal,Extremity Movements Upper (arms, wrists, hands, fingers): None, normal Lower (legs, knees, ankles, toes): None, normal, Trunk Movements Neck, shoulders, hips: None, normal, Overall Severity Severity of abnormal movements (highest score from questions above): None, normal Incapacitation due to abnormal movements: None, normal Patient's awareness of abnormal movements (rate only patient's report): No Awareness, Dental Status Current problems with teeth and/or dentures?: No Does patient usually wear dentures?: No  CIWA:    COWS:     Musculoskeletal: Strength & Muscle Tone: within normal limits Gait & Station: normal Patient leans: N/A  Psychiatric Specialty Exam: Physical Exam  Nursing note and vitals reviewed.   Review of Systems  Unable to perform ROS: Mental acuity    Blood pressure 115/63, pulse (!) 102, temperature 97.8 F (36.6 C), temperature source Oral, resp. rate 16, height 5\' 9"  (1.753 m), weight 59 kg (130 lb).Body mass index is 19.2 kg/m.  General Appearance: Guarded   Eye Contact:  Poor  Speech:  normal rate - but is mute when Clinical research associate attempted to assess  Volume:  see above  Mood:observed as   does not respond on and off   Affect:  Labile  Thought Process:  Disorganized, Irrelevant and Descriptions of Associations: Circumstantial  Orientation:  Other:  to person  Thought Content:  Delusions, Paranoid Ideation, Rumination and responding to internal stimuli- seen as talking  to self   Suicidal Thoughts: did not express any  Homicidal Thoughts:  did not express any  Memory:  Immediate; grossly intact Recent; poor  Remote;  poor  Judgement:  Impaired  Insight:  Shallow  Psychomotor Activity:  Restlessness  Concentration:  Concentration: Poor and Attention Span: Fair  Recall:  FiservFair  Fund of Knowledge:  Poor  Language:  Fair  Akathisia:  No  Handed:  Right  AIMS (if indicated):     Assets:  Social Support  ADL's:  Intact  Cognition:  WNL  Sleep:  Number of Hours: 6.25   Treatment Plan Summary:patient today continues to be not cooperative when Clinical research associatewriter attempted to evaluate Scientific laboratory technician- writer made several attempts today . Pt is on forced nonemergent medications - started 07/02/16 - valid for 7 days.  Paranoid schizophrenia (HCC) unstable   Will continue today 07/06/16  plan as below except where it is noted.  Daily contact with patient to assess and evaluate symptoms and progress in treatment and Medication management For psychosis: Will continue Invega  9 mg po/  Zyprexa 10 mg IM daily. Will continue Cogentin 0.5 mg po/IM bid for EPS.  For mood lability: Will continue  Lamictal to 50 mg po BID.  Pt has been taking it.  Could be labile and been known to refuse. Will continue  Ativan 0.5 mg po bid.  For insomina: Will continue Trazodone  150 mg po qhs for sleep.  For agitation/anxiety: Continue PRN medications as per agitation protocol.  For Vitamin b12 deficiency: Continue Vitamin b12 1000 mcg daily po.  Will continue to monitor vitals  ,medication compliance and treatment side effects while patient is here.   Will monitor for medical issues as well as call consult as needed.   Reviewed labs - pt refused-  TSH, Lipid panel, hba1c, pl, vitamin b12.  Pt refused EKG for qtc monitoring- will reorder .  PPD offered- 06/26/16- pt refused.  CSW will continue  working on disposition.[facility]  [facility].  [facility] - I have reviewed it- [facility]. Pt as per records was noncompliant with out patient follow ups or medications zyprexa, lexapro - [facility]. Pt also was abusing cocaine, cannabis . He presented with psychosis , mood lability - responded well to invega - was given invega sustenna IM. He was also found to have Vitamin b12 deficiency - was started Vitamin b12 replacement. Pt was discharged to his guardian who found placement. His family hx if unknown , he was adopted as a child, went up to 9 th grade.    Patient to participate in therapeutic milieu.  Lindwood QuaSheila May Aylla Huffine, NP [facility] 07/06/2016, 11:14 AM

## 2016-07-06 NOTE — Progress Notes (Signed)
Patient did not attend wrap up group. 

## 2016-07-06 NOTE — Progress Notes (Addendum)
Patient refused vistaril medication at 1200.  Later patient agreed to take vistaril scheduled and also zyprexa prn and ativan prn for several anxiety/agitation.   Safety maintained with 15 minute checks.

## 2016-07-06 NOTE — Progress Notes (Signed)
Patient has been sleeping.  Respirations even and unlabored.  No signs/symptoms of pain/distress noted.  Patient has refused to fill out and sign self inventory sheet.

## 2016-07-06 NOTE — Progress Notes (Addendum)
No PPD administered as patient has refused all labs, EKG's, and PPD.

## 2016-07-06 NOTE — Plan of Care (Signed)
Problem: Education: Goal: Will be free of psychotic symptoms Outcome: Not Progressing Nurse has been talking to patient throughout day about medications, coping skills.

## 2016-07-06 NOTE — Progress Notes (Signed)
D: Pt continues to be disorganized and argumentative. Pt spent majority of the evening in his room.  A: Pt was offered support and encouragement. Pt was given scheduled medications. Pt was encourage to attend groups. Q 15 minute checks were done for safety.   R: safety maintained on unit.

## 2016-07-06 NOTE — [facility] Group Notes (Signed)
[facility] Group Notes: (Clinical Social Work)   07/06/2016      Type of Therapy:  Group Therapy   Participation Level:  Did Not Attend despite MHT prompting   Ambrose MantleMareida Grossman-Orr, LCSW 07/06/2016, 1:41 PM

## 2016-07-06 NOTE — [facility] Group Notes (Signed)
The focus of this group is to educate the patient on the purpose and policies of crisis stabilization and provide a format to answer questions about their admission.  The group details unit policies and expectations of patients while admitted.  Patient did not attend 0900 nurse education orientation group this morning.  Patient was walking in the hallway and in his room.

## 2016-07-07 NOTE — Progress Notes (Signed)
Patient was not attend wrap-up group.

## 2016-07-07 NOTE — [facility] Group Notes (Signed)
[facility] Group Notes: (Clinical Social Work)   07/07/2016      Type of Therapy:  Group Therapy   Participation Level:  Did Not Attend despite MHT prompting   Ambrose MantleMareida Grossman-Orr, LCSW 07/07/2016, 11:46 AM

## 2016-07-07 NOTE — Progress Notes (Signed)
1:1 Note: Patient maintained on constant supervision for safety.  Patient is been calm and appropriate to situation.  No behavioral issue noted or reported.  Patient compliant with medication management.  Routine safety checks continues.  Patient in the dayroom for meal.  Patient safe with supervision.

## 2016-07-07 NOTE — Progress Notes (Signed)
1:1 Note: Patient maintained on constant supervision for safety.  Patient is at the end of the hallway sitting quietly on the floor.  Patient is alert and oriented to person and place.  Denies any discomfort or pain.  Patient ambulatory on the unit without difficulty.  Patient presents with anxious affect and mood.  Patient is suspicious and remained guarded on the unit.  Patient safe with supervision.
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
## 2016-07-07 NOTE — Plan of Care (Signed)
Problem: Coping: Goal: Ability to identify and develop effective coping behavior will improve Outcome: Not Progressing Pt avoids writer, continues to be paranoid.

## 2016-07-07 NOTE — Progress Notes (Signed)
Nursing 1:1 note D:Pt observed sleeping in bed with eyes closed. RR even and unlabored. No distress noted. A: 1:1 observation continues for safety  R: pt remains safe  

## 2016-07-07 NOTE — Progress Notes (Signed)
D: Pt stated he was ok. Pt does not talk much to writer, pt stated "I'm ok, I'm not going to sleep tonight". Pt stopped talking to Clinical research associatewriter and would not answer questions. Pt isolates to his room most of the evening.    A: Pt was offered support and encouragement.  Pt was encourage to attend groups. Q 15 minute checks were done for safety.   R: safety maintained on unit.

## 2016-07-08 MED ORDER — LAMOTRIGINE 25 MG PO TABS
50.0000 mg | ORAL_TABLET | Freq: Every day | ORAL | Status: DC
  Administered 2016-07-09 – 2016-07-17 (×9): 50 mg via ORAL
  Filled 2016-07-08 (×12): qty 2

## 2016-07-08 MED ORDER — OLANZAPINE 10 MG IM SOLR
10.0000 mg | Freq: Every day | INTRAMUSCULAR | Status: DC
Start: 2016-07-09 — End: 2016-07-10
  Filled 2016-07-08 (×3): qty 10

## 2016-07-08 MED ORDER — LAMOTRIGINE 25 MG PO TABS
75.0000 mg | ORAL_TABLET | Freq: Every evening | ORAL | Status: DC
Start: 2016-07-08 — End: 2016-07-11
  Administered 2016-07-08 – 2016-07-10 (×3): 75 mg via ORAL
  Filled 2016-07-08 (×4): qty 3

## 2016-07-08 MED ORDER — PALIPERIDONE ER 6 MG PO TB24
12.0000 mg | ORAL_TABLET | Freq: Every day | ORAL | Status: DC
  Administered 2016-07-09: 12 mg via ORAL
  Filled 2016-07-08 (×3): qty 2

## 2016-07-08 NOTE — Progress Notes (Signed)
Patient ID: David Daviesaul Butzin Jr., male   DOB: 09/16/1980, 35 y.o.   MRN: [id number] PER STATE REGULATIONS 482.30  THIS CHART WAS REVIEWED FOR MEDICAL NECESSITY WITH RESPECT TO THE PATIENT'S ADMISSION/ DURATION OF STAY.  NEXT REVIEW DATE: 07/10/2016  Willa RoughJENNIFER JONES Blaine Guiffre, RN, BSN CASE MANAGER

## 2016-07-08 NOTE — Progress Notes (Signed)
[facility] Second [facility] Opinion Progress Note for Medication Administration to Non-consenting Patients (For Involuntarily Committed Patients)  Patient: David Daviesaul Corkins Jr. Date of Birth: [birth year] MRN: [id number]  Reason for the Medication: The patient, without the benefit of the specific treatment measure, is incapable of participating in any available treatment plan that will give the patient a realistic opportunity of improving the patient's condition. There is, without the benefit of the specific treatment measure, a significant possibility that the patient will harm self or others before improvement of the patient's condition is realized.  Consideration of Side Effects: Consideration of the side effects related to the medication plan has been given.  Rationale for Medication Administration:  Pt with history of schizophrenia, has conintued torefuse all medications and recommended care since last medication administration evaulation. Exhibits responding to unseen others, paranoia, agitation. Lacks insight into necessity of treatment.  The patient, without the benefit of the specific treatment measure, is incapable of participating in any available treatment plan that will give the patient a realistic opportunity of improving the patient's condition. There is, without the benefit of the specific treatment measure, a significant possibility that the patient will harm self or others before improvement of the patient's condition is realized.    Acquanetta SitElizabeth Woods Oates, MD 07/08/16  11:56 AM   This documentation is good for (7) seven days from the date of the MD signature. New documentation must be completed every seven (7) days with detailed justification in the medical record if the patient requires continued non-emergent administration of psychotropic medications.

## 2016-07-08 NOTE — Progress Notes (Signed)
D: Pt awake in dayroom, standing and watching TV at present. Remains disorganized in his thoughts, continues to respond to internal stimuli with paranoia.  A: Evening medications administered as prescribed.  1:1 observation level maintained as ordered. Assigned staff in attendance at all times. Continued support and encouragement provided to pt throughout this shift. R: Pt compliant with medications. Pleasant and cooperative with care at this time. Remains on unit without self injurious behavior or outburst. POC maintained for safety and mood stability.

## 2016-07-08 NOTE — Progress Notes (Signed)
Nursing 1:1 note D:Pt observed sleeping in bed with eyes closed. RR even and unlabored. No distress noted. A: 1:1 observation continues for safety  R: pt remains safe  

## 2016-07-08 NOTE — Progress Notes (Signed)
Recreation Therapy Notes  Date: 07/08/16 Time: 1000 Location: 500 Hall Dayroom  Group Topic: Coping Skills  Goal Area(s) Addresses:  Pt will be able to identify positive coping skills. Pt will be able to identify the importance of coping skills. Pt will be able to identify effects of coping skills post d/c?  Intervention: Magazines, scissors, glue sticks, construction paper, coping skills worksheet  Activity: Patients were given a worksheet divided into five areas: diversions, cognitive, social, tension releasers and physical.  Patients were to look through the magazines and find pictures that display coping skills that could be used for each area.  If the patient couldn't find a picture for the coping skill they wanted, they could write it in.  Education: PharmacologistCoping Skills, Building control surveyorDischarge Planning.   Education Outcome: Acknowledges understanding/In group clarification offered/Needs additional education.   Clinical Observations/Feedback: Pt did not attend group.    Caroll RancherMarjette Taci Sterling, LRT/CTRS       Caroll RancherLindsay, Terrell Ostrand A 07/08/2016 12:09 PM
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
## 2016-07-08 NOTE — Progress Notes (Signed)
Pt continues to appear to not want to engage with Clinical research associatewriter. Pt will answer a few questions with "I'm ok", then will not respond to writer and start to become verbally argumentative.

## 2016-07-08 NOTE — [facility] Group Notes (Signed)
[facility] LCSW Group Therapy  07/08/2016 1:15 pm  Type of Therapy: Process Group Therapy  Participation Level:  Active  Participation Quality:  Appropriate  Affect:  Flat  Cognitive:  Oriented  Insight:  Improving  Engagement in Group:  Limited  Engagement in Therapy:  Limited  Modes of Intervention:  Activity, Clarification, Education, Problem-solving and Support  Summary of Progress/Problems: Today's group addressed the issue of overcoming obstacles.  Patients were asked to identify their biggest obstacle post d/c that stands in the way of their on-going success, and then problem solve as to how to manage this. Invited.  Chose to not attend.  Ida Rogueorth, Mychelle Kendra B 07/08/2016   3:34 PM

## 2016-07-08 NOTE — Progress Notes (Signed)
Pt up in the hall pacing , posturing and threatening verbally. Pt threw coffee at the dayroom window. Pt was given IM Zyprexa , pt took it willingly

## 2016-07-08 NOTE — Progress Notes (Signed)
D: Pt observed asleep at intervals during shift. Remains paranoid; initially refused lunch, stated "Y'all trying to poison me, I've been poisoned before". "I'm going to drink this juice because God touched water but not that food". A: Emotional support and availability provided to pt. Noon medications administered with verbal encouragement. 1:1 maintained as ordered without outburst. R: Pt compliant with medication. Pleasant and cooperative with care at this time. Remains safe on unit. POC remains effective for safety and mood stability.

## 2016-07-08 NOTE — Progress Notes (Signed)
Adult Psychoeducational Group Note  Date:  07/08/2016 Time:  9:36 PM  Group Topic/Focus:  Wrap-Up Group:   The focus of this group is to help patients review their daily goal of treatment and discuss progress on daily workbooks.   Participation Level:  Did Not Attend  Participation Quality:  Did not attend  Affect:  Did not attend   Cognitive:  Did not attend  Insight: None  Engagement in Group:  Did not attend  Modes of Intervention:  Did not attend  Additional Comments:  Did not attend  Jamas LavBailey, Rayner Erman W 07/08/2016, 9:36 PM

## 2016-07-09 MED ORDER — PALIPERIDONE PALMITATE 234 MG/1.5ML IM SUSP
234.0000 mg | Freq: Once | INTRAMUSCULAR | Status: DC
  Filled 2016-07-09: qty 1.5

## 2016-07-09 MED ORDER — PALIPERIDONE PALMITATE 156 MG/ML IM SUSP
156.0000 mg | Freq: Once | INTRAMUSCULAR | Status: AC
  Administered 2016-07-15: 156 mg via INTRAMUSCULAR
  Filled 2016-07-09: qty 1

## 2016-07-09 MED ORDER — PALIPERIDONE PALMITATE 156 MG/ML IM SUSP: 156.0000 mg | INTRAMUSCULAR | Status: DC

## 2016-07-09 MED ORDER — PALIPERIDONE PALMITATE 156 MG/ML IM SUSP: 156.0000 mg | Freq: Once | INTRAMUSCULAR | Status: DC

## 2016-07-09 MED ORDER — PALIPERIDONE PALMITATE 234 MG/1.5ML IM SUSP
234.0000 mg | Freq: Once | INTRAMUSCULAR | Status: AC
  Administered 2016-07-09: 234 mg via INTRAMUSCULAR
  Filled 2016-07-09: qty 1.5

## 2016-07-09 NOTE — Progress Notes (Signed)
1:1 Nursing Note 07/09/2016 [id number]  Data Reports sleeping good with/without PRN sleep med.   Did not complete self-inventory.  Patient in bed on arrival with 1:1 present, very minimal and guarded with this nurse.  Refused his AM medicine from this RN stating "I take Zyprexa Zydis" when told that what was brought was what the MD had ordered patient refused.  Approached a few minutes later by other RN assigned to this hallway and did take his medicine.  Was asking to remain on a 1:1 but did not disclose why. Denied HI, SI, AVH.  No acting out behavior observed.   Action Spoke with patient 1:1, nurse offered support to patient throughout shift.  Discussed patient status with treatment team. 1:1 and   Response Will continue to monitor, no acting out behavior.

## 2016-07-09 NOTE — Progress Notes (Signed)
Recreation Therapy Notes  Date: 07/09/16 Time: 1000 Location: 500 Hall Dayroom  Group Topic: Wellness  Goal Area(s) Addresses:  Patient will define components of whole wellness. Patient will verbalize benefit of whole wellness.  Intervention:  Chairs, beach ball  Activity: Keep It ContractorGoing Volleyball.  Patients were placed in a circle.  Patients were to hit the ball back and forth to each other.  Patients were allowed to bounce the ball off the ground but the ball could not roll to a stop on the floor.  Patients were to try and beat the previous record of 550.  Education: Wellness, Building control surveyorDischarge Planning.   Education Outcome: Acknowledges education/In group clarification offered/Needs additional education.   Clinical Observations/Feedback: Pt did not attend group.   Caroll RancherMarjette Billal Rollo, LRT/CTRS       Caroll RancherLindsay, Loralee Weitzman A 07/09/2016 11:29 AM

## 2016-07-09 NOTE — [facility] Group Notes (Signed)
[facility] LCSW Group Therapy  07/09/2016 , 12:11 PM   Type of Therapy:  Group Therapy  Participation Level:  Active  Participation Quality:  Attentive  Affect:  Appropriate  Cognitive:  Alert  Insight:  Improving  Engagement in Therapy:  Engaged  Modes of Intervention:  Discussion, Exploration and Socialization  Summary of Progress/Problems: Today's group focused on the term Diagnosis.  Participants were asked to define the term, and then pronounce whether it is a negative, positive or neutral term. Invited, chose to not attend.  Daryel Geraldorth, Cindia Hustead B 07/09/2016 , 12:11 PM
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
## 2016-07-09 NOTE — Progress Notes (Signed)
Nursing 1:1 note D:Pt observed sleeping in bed with eyes closed. RR even and unlabored. No distress noted. A: 1:1 observation continues for safety  R: pt remains safe  

## 2016-07-09 NOTE — Progress Notes (Signed)
1100- [facility] Post 1:1 Observation Documentation  Time 1:1 discontinued:  0906  Patient's Behavior:  Forwards little, guarded.  Attending group but not sharing.  No acting out behaviors observed.  Patient's Condition:  Stable  Patient's Conversation: Attending MD reported patient had a lengthy and appropriate conversation with her.

## 2016-07-09 NOTE — Progress Notes (Signed)
1736 - [facility] Post 1:1 Observation Documentation  Time 1:1 discontinued:  0906  Patient's Behavior:  Continues to be somewhat labile.  Isolates in room.  Minimal interaction with staff and peers.  Took PO medicine after encouraged by this RN, then offered by another.  Witnessed by this Charity fundraiserN.  Initially declined scheduled Invega. No acting out behavior.  Forwards little. Guarded.  Patient's Condition:  Stable.    Patient's Conversation: Very minimal with Charity fundraiserN.

## 2016-07-09 NOTE — Progress Notes (Signed)
Recreation Therapy Notes  Animal-Assisted Activity (AAA) Program Checklist/Progress Notes Patient Eligibility Criteria Checklist & Daily Group note for Rec Tx Intervention  Date: 11.28.2017 Time: 2:45pm [address]    AAA/T Program Assumption of Risk Form signed by Patient/ or Parent Legal Guardian No  Behavioral Response: Did not attend.   Clinical Observations/Feedback: Patient discussed with MD for appropriateness in pet therapy session. Both LRT and MD agree patient is appropriate for participation. Patient offered participation in session, but declined invitation. It appeared that LRT had awoken patient upon entering room, as he quickly drifted back to sleep. Dried blood appeared to be on patient pillow near patient mouth, no open wounds noticed by LRT. MHT and RN notified of LRT observation.   Marykay Lexenise L Vandell Kun, LRT/CTRS        Caterin Tabares L 07/09/2016 3:14 PM

## 2016-07-09 NOTE — Progress Notes (Signed)
1500- [facility] Post 1:1 Observation Documentation  Time 1:1 discontinued:  0906  Patient's Behavior:  Patient currently laying in bed, resting with eyes closed, even non-labored chest rises.  Patient's Condition:  Stable, calm.  Patient's Conversation: Nurse checked in with patient, asked if he had any concerns, patient shook head.  Patient guarded and would not speak with this nurse.

## 2016-07-09 NOTE — [facility] Group Notes (Signed)
Adult Psychoeducational Group Note  Date:  07/09/2016 Time:  0930-1000  Group Topic/Focus:  Recovery Goals:   The focus of this group is to identify appropriate goals for recovery and establish a plan to achieve them.  Also discussed importance of taking medicines, keeping open line of communication with staff members, nutrition, and sleep hygiene.   Participation Level:  None  Participation Quality:  N/A  Insight: Did not share  Engagement in Group:  Did not share  Modes of Intervention:  Discussion, Education and Support  Additional Comments:  Patient attended for duration of group but refused to share.  [name] 07/09/2016, 10:56 AM

## 2016-07-10 DIAGNOSIS — S069XAA Unspecified intracranial injury with loss of consciousness status unknown, initial encounter: Secondary | ICD-10-CM | POA: Insufficient documentation

## 2016-07-10 DIAGNOSIS — S069X9A Unspecified intracranial injury with loss of consciousness of unspecified duration, initial encounter: Secondary | ICD-10-CM | POA: Insufficient documentation

## 2016-07-10 DIAGNOSIS — E538 Deficiency of other specified B group vitamins: Secondary | ICD-10-CM | POA: Diagnosis present

## 2016-07-10 MED ORDER — TUBERCULIN PPD 5 UNIT/0.1ML ID SOLN: 5.0000 [IU] | Freq: Once | INTRADERMAL | Status: DC

## 2016-07-10 NOTE — Progress Notes (Signed)
Adult Psychoeducational Group Note  Date:  07/10/2016 Time:  11:45 PM  Group Topic/Focus:  Wrap-Up Group:   The focus of this group is to help patients review their daily goal of treatment and discuss progress on daily workbooks.   Participation Level:  Did Not Attend  Participation Quality:  Did not attend  Affect:  Did not attend  Cognitive:  Did not attend  Insight: None  Engagement in Group:  Did not attend  Modes of Intervention:  Did not attend  Additional Comments:  Patient did not attend wrap up group this evening. Canyon Lohr L Treasure Ochs 07/10/2016, 11:45 PM

## 2016-07-10 NOTE — Progress Notes (Signed)
Patient denies SI, HI and AVH.  Patient continues to pace the hall and remains unpredictable.  Patient has had no issues of behavioral dyscontrol this shift.   Assess patient safety offer medications as prescribed, engage patient in 1:1 staff talks.   Continue to monitor as prescribed.

## 2016-07-10 NOTE — Tx Team (Signed)
Interdisciplinary Treatment and Diagnostic Plan Update  07/10/2016 Time of Session: 9:08 AM  David Davila. MRN: [id number]  Principal Diagnosis: Paranoid schizophrenia (HCC)  Secondary Diagnoses: Principal Problem:   Paranoid schizophrenia (HCC) Active Problems:   Noncompliance with medication regimen   Cocaine use disorder, mild, abuse   Cannabis use disorder, mild, abuse   Current Medications:  Current Facility-Administered Medications  Medication Dose Route Frequency Provider Last Rate Last Dose  . acetaminophen (TYLENOL) tablet 650 mg  650 mg Oral Q6H PRN Charm Rings, NP      . alum & mag hydroxide-simeth (MAALOX/MYLANTA) 200-200-20 MG/5ML suspension 30 mL  30 mL Oral Q4H PRN Charm Rings, NP      . atorvastatin (LIPITOR) tablet 10 mg  10 mg Oral q1800 Jomarie Longs, MD   10 mg at 07/09/16 1705  . benztropine (COGENTIN) tablet 0.5 mg  0.5 mg Oral Daily Saramma Eappen, MD   0.5 mg at 07/10/16 4098   Or  . benztropine mesylate (COGENTIN) injection 0.5 mg  0.5 mg Intramuscular Daily Saramma Eappen, MD      . cyanocobalamin ((VITAMIN B-12)) injection 1,000 mcg  1,000 mcg Intramuscular Once Jomarie Longs, MD      . hydrOXYzine (ATARAX/VISTARIL) tablet 25 mg  25 mg Oral TID Charm Rings, NP   25 mg at 07/10/16 0806  . lamoTRIgine (LAMICTAL) tablet 50 mg  50 mg Oral Daily Jomarie Longs, MD   50 mg at 07/10/16 0806  . lamoTRIgine (LAMICTAL) tablet 75 mg  75 mg Oral QPM Saramma Eappen, MD   75 mg at 07/09/16 1705  . LORazepam (ATIVAN) tablet 1 mg  1 mg Oral Q6H PRN Jomarie Longs, MD   1 mg at 07/06/16 1453   Or  . LORazepam (ATIVAN) injection 1 mg  1 mg Intramuscular Q6H PRN Jomarie Longs, MD      . LORazepam (ATIVAN) tablet 0.5 mg  0.5 mg Oral BID Jomarie Longs, MD   0.5 mg at 07/10/16 0808  . magnesium hydroxide (MILK OF MAGNESIA) suspension 30 mL  30 mL Oral Daily PRN Charm Rings, NP      . OLANZapine zydis (ZYPREXA) disintegrating tablet 10 mg  10 mg Oral BID PRN  Jackelyn Poling, NP   10 mg at 07/06/16 1455   Or  . OLANZapine (ZYPREXA) injection 10 mg  10 mg Intramuscular BID PRN Jackelyn Poling, NP   10 mg at 07/08/16 0404  . paliperidone (INVEGA) 24 hr tablet 12 mg  12 mg Oral Daily Saramma Eappen, MD   12 mg at 07/09/16 0800   Or  . OLANZapine (ZYPREXA) injection 10 mg  10 mg Intramuscular Daily Jomarie Longs, MD      . [name] ON 08/13/2016] paliperidone (INVEGA SUSTENNA) injection 156 mg  156 mg Intramuscular Q28 days Jomarie Longs, MD      . [name] ON 07/15/2016] paliperidone (INVEGA SUSTENNA) injection 156 mg  156 mg Intramuscular Once Jomarie Longs, MD      . traZODone (DESYREL) tablet 150 mg  150 mg Oral QHS Jomarie Longs, MD   150 mg at 07/03/16 2237  . tuberculin injection 5 Units  5 Units Intradermal Once Jomarie Longs, MD        PTA Medications: Prescriptions Prior to Admission  Medication Sig Dispense Refill Last Dose  . atorvastatin (LIPITOR) 10 MG tablet Take 10 mg by mouth at bedtime.   03/09/2016 at Unknown time  . cyanocobalamin (,VITAMIN B-12,) 1000 MCG/ML injection Inject  1,000 mcg into the muscle every 30 (thirty) days.   Past Month at Unknown time  . lamoTRIgine (LAMICTAL) 100 MG tablet Take 100 mg by mouth 2 (two) times daily.   03/09/2016 at Unknown time  . LORazepam (ATIVAN) 0.5 MG tablet Take 0.5 mg by mouth every 6 (six) hours as needed for anxiety.   03/09/2016 at Unknown time    Treatment Modalities: Medication Management, Group therapy, Case management,  1 to 1 session with clinician, Psychoeducation, Recreational therapy.   Physician Treatment Plan for Primary Diagnosis: Paranoid schizophrenia (HCC) Long Term Goal(s): Improvement in symptoms so as ready for discharge  Short Term Goals: Compliance with prescribed medications will improve  Medication Management: Evaluate patient's response, side effects, and tolerance of medication regimen.  Therapeutic Interventions: 1 to 1 sessions, Unit Group sessions and Medication  administration.  Evaluation of Outcomes: Progressing  11/20::Patient today seen as  delusional ,disorganized , and labile.  Will continue to readjust medications. Will cross taper Haldol with Invega for psychosis . Haldol likely not effective . Will increase Invega to 6 mg po qhs. Will continue Cogentin 0.5 mg po bid for EPS.  For mood lability: Increased Lamictal to 25 mg po BID.  11/24: :patient today continues to be not cooperative when Clinical research associatewriter attempted to evaluate Scientific laboratory technician- writer made several attempts today .  Paranoid schizophrenia (HCC) unstable  For psychosis: Will continue Invega  9 mg po/  Zyprexa 10 mg IM daily . Will continue Cogentin 0.5 mg po/IM bid for EPS.  For mood lability: Will continue  Lamictal to 50 mg po BID.Pt has been refusing it . Will continue  Ativan 0.5 mg po bid.  11/29:  For psychosis: Will discontinue Invega PO - since he is on Invega sustenna IM. Invega sustenna IM 234 mg x 1 dose - 07/09/16, Invega sustenna IM 156 mg IM - next dose - 07/15/16 , then repeat 156 mg IM q 28 days.   Physician Treatment Plan for Secondary Diagnosis: Principal Problem:   Paranoid schizophrenia (HCC) Active Problems:   Noncompliance with medication regimen   Cocaine use disorder, mild, abuse   Cannabis use disorder, mild, abuse   Long Term Goal(s): Improvement in symptoms so as ready for discharge  Short Term Goals: Ability to verbalize feelings will improve  Medication Management: Evaluate patient's response, side effects, and tolerance of medication regimen.  Therapeutic Interventions: 1 to 1 sessions, Unit Group sessions and Medication administration.  Evaluation of Outcomes: Progressing   RN Treatment Plan for Primary Diagnosis: Paranoid schizophrenia (HCC) Long Term Goal(s): Knowledge of disease and therapeutic regimen to maintain health will improve  Short Term Goals: Ability to identify and develop effective coping behaviors will improve and Compliance  with prescribed medications will improve  Medication Management: RN will administer medications as ordered by provider, will assess and evaluate patient's response and provide education to patient for prescribed medication. RN will report any adverse and/or side effects to prescribing provider.  Therapeutic Interventions: 1 on 1 counseling sessions, Psychoeducation, Medication administration, Evaluate responses to treatment, Monitor vital signs and CBGs as ordered, Perform/monitor CIWA, COWS, AIMS and Fall Risk screenings as ordered, Perform wound care treatments as ordered.  Evaluation of Outcomes: Progressing   LCSW Treatment Plan for Primary Diagnosis: Paranoid schizophrenia (HCC) Long Term Goal(s): Safe transition to appropriate next level of care at discharge, Engage patient in therapeutic group addressing interpersonal concerns.  Short Term Goals: Engage patient in aftercare planning with referrals and resources  Therapeutic Interventions: Assess for  all discharge needs, 1 to 1 time with Child psychotherapistocial worker, Explore available resources and support systems, Assess for adequacy in community support network, Educate family and significant other(s) on suicide prevention, Complete Psychosocial Assessment, Interpersonal group therapy.  Evaluation of Outcomes: Progressing  Have spoken with guardian, David Davila, [phone] who is attempting to find placement for patient.  In the meantime, [facility] 11/20:  [facility].  [facility] has come to do evaluation.  Pt refused PPD-will need to get chest xray.  Guardian sent back signed releases today so we can try to access records at previous hospitalizations 11/24:  Guardian visited on Wed.  Stated that patient is not at baseline-was talking to guardian about non-existent people and situations that did not occur. 11/29:  Guardian came to visit today.  Told pt he thought he needed about another week.  CSW told guardian pt would  be d/ced no later than Wed of next week-1 week.  Guardian stated he alone is not responsible for pt placement and asked CSW for help.  CSW to contact potential [facility], while reminding guardian that he is ultimately responsible for transporation of pt away from facility.   Progress in Treatment: Attending groups: Yes Participating in groups: Yes Taking medication as prescribed: Yes Toleration medication: Yes, no side effects reported at this time Family/Significant other contact made: Yes  Guardian Patient understands diagnosis: No  Limited insight Discussing patient identified problems/goals with staff: Yes Medical problems stabilized or resolved: Yes Denies suicidal/homicidal ideation: Yes Issues/concerns per patient self-inventory: None Other: N/A  New problem(s) identified: None identified at this time.   New Short Term/Long Term Goal(s): None identified at this time.   Discharge Plan or Barriers:   Reason for Continuation of Hospitalization: Paranoia Disorganization Medication stabilization   Estimated Length of Stay: 3-5 days  Attendees: Patient: 07/10/2016  9:08 AM  Physician: Jomarie LongsSaramma Eappen, MD 07/10/2016  9:08 AM  Nursing: Esperanza SheetsElizabeth Iwenekha RN 07/10/2016  9:08 AM  RN Care Manager: Onnie BoerJennifer Clark, RN 07/10/2016  9:08 AM  Social Worker: Richelle Itood Oliver Neuwirth 07/10/2016  9:08 AM  Recreational Therapist: Aggie CosierMarjette Lindsey 07/10/2016  9:08 AM  Other: Tomasita Morrowelora Sutton 07/10/2016  9:08 AM  Other:  07/10/2016  9:08 AM    Scribe for Treatment Team:  Daryel Geraldodney Rafiq Bucklin 07/10/2016 9:08 AM

## 2016-07-10 NOTE — Progress Notes (Signed)
Patient ID: David Daviesaul Sermeno Jr., male   DOB: 02/08/1981, 35 y.o.   MRN: [id number] PER STATE REGULATIONS 482.30  THIS CHART WAS REVIEWED FOR MEDICAL NECESSITY WITH RESPECT TO THE PATIENT'S ADMISSION/ DURATION OF STAY.  NEXT REVIEW DATE: 07/14/2016  Willa RoughJENNIFER JONES Mayela Bullard, RN, BSN CASE MANAGER

## 2016-07-10 NOTE — [facility] Group Notes (Signed)
[facility] LCSW Group Therapy  07/10/2016 2:32 PM   Type of Therapy:  Group Therapy   Participation Level:  Engaged  Participation Quality:  Attentive  Affect:  Appropriate   Cognitive:  Alert   Insight:  Engaged  Engagement in Therapy:  Improving   Modes of Intervention:  Education, Exploration, Socialization   Summary of Progress/Problems: Invited, chose not to attend.   Onalee HuaDavid from [employer] was here to tell his story of recovery, inform patients about [employer] and play his guitar.   Baldo DaubJolan Amaryah Mallen 07/10/2016 2:32 PM

## 2016-07-10 NOTE — Progress Notes (Signed)
[facility] MD Progress Note  07/10/2016 12:55 PM David Davila.  MRN:  [id number] Subjective:  Pt today states " I am OK. Where can I go when I get discharged."   Objective:  David Davila.is a 35 y.o.AA male, who is single, on SSD , who has a hx of schizophrenia and noncompliance with medications , used to live at a [facility] in [place] , was brought in by [employer] to [facility] from a night club for disorganized behavior .  Patient seen and chart reviewed.Discussed patient with treatment team.  Pt today seen as calm , more cooperative than previous days - he is able to respond to questions appropriately. Pt continues to have paranoia on and off and is selective when talking to staff - however , overall has been making progress. Pt tolerated Invega sustenna IM yesterday - his next dose is due in 5 days. Pt per staff continues to stay to self , attends groups on and off , does not go out to the cafeteria for meals , states " he does not like to fight there." Pt has a legal guardian who will visit pt soon. Will continue to support.      Principal Problem: Paranoid schizophrenia (HCC) Diagnosis:   Patient Active Problem List   Diagnosis Date Noted  . Noncompliance with medication regimen [Z91.14] 06/25/2016  . Cocaine use disorder, mild, abuse [F14.10] 06/25/2016  . Cannabis use disorder, mild, abuse [F12.10] 06/25/2016  . Paranoid schizophrenia (HCC) [F20.0] 06/24/2016   Total Time spent with patient: 25 minutes  Past Psychiatric History: Please see H&P.   Past Medical History:  Past Medical History:  Diagnosis Date  . Acute psychosis   . Cannabis abuse    Past  . Paranoid schizophrenia (HCC)     Family History:Please see H&P.  Family Psychiatric  History: unknown - pt unable to discuss , guardian unaware. Social History: pt has legal guardian - [facility] [place] [facility]. History  Alcohol Use No     History  Drug Use  . Types: Cocaine, Marijuana    Social History   Social History  . Marital  status: Single    Spouse name: N/A  . Number of children: N/A  . Years of education: N/A   Social History Main Topics  . Smoking status: Current Every Day Smoker    Packs/day: 1.00    Types: Cigarettes  . Smokeless tobacco: Never Used  . Alcohol use No  . Drug use:     Types: Cocaine, Marijuana  . Sexual activity: Not Asked   Other Topics Concern  . None   Social History Narrative  . None   Additional Social History:                         Sleep: fair  Appetite:  fair  Current Medications: Current Facility-Administered Medications  Medication Dose Route Frequency Provider Last Rate Last Dose  . acetaminophen (TYLENOL) tablet 650 mg  650 mg Oral Q6H PRN Charm Rings, NP      . alum & mag hydroxide-simeth (MAALOX/MYLANTA) 200-200-20 MG/5ML suspension 30 mL  30 mL Oral Q4H PRN Charm Rings, NP      . atorvastatin (LIPITOR) tablet 10 mg  10 mg Oral q1800 Jomarie Longs, MD   10 mg at 07/09/16 1705  . cyanocobalamin ((VITAMIN B-12)) injection 1,000 mcg  1,000 mcg Intramuscular Once Jomarie Longs, MD      . hydrOXYzine (ATARAX/VISTARIL) tablet 25 mg  25 mg Oral TID Charm RingsJamison Y Lord, NP   25 mg at 07/10/16 1150  . lamoTRIgine (LAMICTAL) tablet 50 mg  50 mg Oral Daily Jomarie LongsSaramma Haleigh Desmith, MD   50 mg at 07/10/16 0806  . lamoTRIgine (LAMICTAL) tablet 75 mg  75 mg Oral QPM Rinoa Garramone, MD   75 mg at 07/09/16 1705  . LORazepam (ATIVAN) tablet 1 mg  1 mg Oral Q6H PRN Jomarie LongsSaramma Yandriel Boening, MD   1 mg at 07/06/16 1453   Or  . LORazepam (ATIVAN) injection 1 mg  1 mg Intramuscular Q6H PRN Jomarie LongsSaramma Myisha Pickerel, MD      . LORazepam (ATIVAN) tablet 0.5 mg  0.5 mg Oral BID Jomarie LongsSaramma Adahlia Stembridge, MD   0.5 mg at 07/10/16 0808  . magnesium hydroxide (MILK OF MAGNESIA) suspension 30 mL  30 mL Oral Daily PRN Charm RingsJamison Y Lord, NP      . OLANZapine zydis (ZYPREXA) disintegrating tablet 10 mg  10 mg Oral BID PRN Jackelyn PolingJason A Berry, NP   10 mg at 07/06/16 1455   Or  . OLANZapine (ZYPREXA) injection 10 mg  10 mg  Intramuscular BID PRN Jackelyn PolingJason A Berry, NP   10 mg at 07/08/16 0404  . [START ON 08/13/2016] paliperidone (INVEGA SUSTENNA) injection 156 mg  156 mg Intramuscular Q28 days Jomarie LongsSaramma Tyria Springer, MD      . [name][START ON 07/15/2016] paliperidone (INVEGA SUSTENNA) injection 156 mg  156 mg Intramuscular Once Jomarie LongsSaramma Kajsa Butrum, MD      . traZODone (DESYREL) tablet 150 mg  150 mg Oral QHS Jomarie LongsSaramma Kenesha Moshier, MD   150 mg at 07/03/16 2237  . tuberculin injection 5 Units  5 Units Intradermal Once Jomarie LongsSaramma Wendelin Reader, MD        Lab Results: No results found for this or any previous visit (from the past 48 hour(s)).  Blood Alcohol level:  Lab Results  Component Value Date   ETH <5 06/22/2016   ETH <5 03/10/2016    Metabolic Disorder Labs: No results found for: HGBA1C, MPG No results found for: PROLACTIN No results found for: CHOL, TRIG, HDL, CHOLHDL, VLDL, LDLCALC  Physical Findings: AIMS: Facial and Oral Movements Muscles of Facial Expression: None, normal Lips and Perioral Area: None, normal Jaw: None, normal Tongue: None, normal,Extremity Movements Upper (arms, wrists, hands, fingers): None, normal Lower (legs, knees, ankles, toes): None, normal, Trunk Movements Neck, shoulders, hips: None, normal, Overall Severity Severity of abnormal movements (highest score from questions above): None, normal Incapacitation due to abnormal movements: None, normal Patient's awareness of abnormal movements (rate only patient's report): No Awareness, Dental Status Current problems with teeth and/or dentures?: No Does patient usually wear dentures?: No  CIWA:  CIWA-Ar Total: 4 COWS:  COWS Total Score: 2  Musculoskeletal: Strength & Muscle Tone: within normal limits Gait & Station: normal Patient leans: N/A  Psychiatric Specialty Exam: Physical Exam  Nursing note and vitals reviewed.   Review of Systems  Psychiatric/Behavioral: The patient is nervous/anxious.   All other systems reviewed and are negative.   Blood  pressure 137/66, pulse 100, temperature 97.8 F (36.6 C), temperature source Oral, resp. rate 18, height 5\' 9"  (1.753 m), weight 59 kg (130 lb).Body mass index is 19.2 kg/m.  General Appearance: Guarded  Eye Contact:  Fair  Speech:  Normal Rate  Volume:  Decreased  Mood:observed as   Anxious on and off   Affect:  Congruent  Thought Process:  Goal Directed and Descriptions of Associations: Circumstantial  Orientation:  Other:  to person, situation  Thought  Content:delusions - paranoia - on and off  Suicidal Thoughts: denies  Homicidal Thoughts:  No  Memory:  Immediate; fair Recent; fair  Remote;  fair  Judgement:  Fair  Insight:  Fair  Psychomotor Activity:  Normal  Concentration:  Concentration: Fair and Attention Span: Fair  Recall:  Fis[employer]Fair  Fund of Knowledge:  Fair  Language:  Fair  Akathisia:  No  Handed:  Right  AIMS (if indicated):     Assets:  Communication Skills Social Support  ADL's:  Intact  Cognition:  WNL  Sleep:  Number of Hours: 6.5   07/02/16 [facility] I have reviewed it- [facility]01[facility]29/16. Pt as per records was noncompliant with out patient follow ups or medications zyprexa, lexapro - was [facility] from prison in November 2016. Pt also was abusing cocaine, cannabis . He presented with psychosis , mood lability - responded well to invega - was given invega sustenna IM. He was also found to have Vitamin b12 deficiency - was started Vitamin b12 replacement. Pt was discharged to his guardian who found placement. His family hx is unknown , he was adopted as a child, went up to 9 th grade.    07/09/16 Writer [facility]  Patient [facility] there several times - most recently 07/29/15- 10/11/15- he walked in with c/o psychosis.  That was patient's 17 admission there .  The previous admission was from 04/2012 to may 2015 - for incapable  to proceed status on charges of breaking and entering.During his stay at that time he stayed for sometime in solitary confinement. During his recent admission there - he was started on Zyprexa and lexapro - discharged on the same - zyprexa 15 mg and lexapro 10 mg. Pt 's discharge diagnosis was schizophrenia multiple episodes , cannabis use do, stimulant use do , tobacco use do, TBI in 2002, vitamin D deficiency and impaired visual acuity.          Treatment Plan Summary: Patient today is calm, less paranoid , continues to improve. Tolerated first dose of  Invega sustenna IM . Pt continues to be on forced nonemergent medications - renewed 07/08/16 - valid for 7 days. [facility] - however if he continues to improve - can be discharged with legal guardian.  Paranoid schizophrenia (HCC) unstable - improving  Will continue today 07/10/16  plan as below except where it is noted.  Daily contact with patient to assess and evaluate symptoms and progress in treatment and Medication management For psychosis: Will discontinue Invega PO - since he is on Invega sustenna IM. Invega sustenna IM 234 mg x 1 dose - 07/09/16, Invega sustenna IM 156 mg IM - next dose - 07/15/16 , then repeat 156 mg IM q 28 days.  For mood lability: Will continue Lamictal  50 mg po daily and 75 mg po qpm. Will continue  Ativan 0.5 mg po bid.  For insomina: Will continue Trazodone  150 mg po qhs for sleep.  For agitation/anxiety: Continue PRN medications as per agitation protocol.  For Vitamin b12 deficiency: Continue Vitamin b12 1000 mcg daily po.  Will continue to monitor vitals ,medication compliance and treatment side effects while patient is here.   Will monitor for medical issues as well as call consult as needed.   Reviewed labs - pt refused-  TSH, Lipid panel, hba1c, pl, vitamin b12- Was  re ordered - pt refused again .  Pt refused EKG for qtc monitoring again  .  PPD offered- 06/26/16 , reordered again 07/10/16- pt refused.  CSW will continue  working on disposition.Patient  referred to [facility] for higher level of care.If improves - can be discharged with legal guardian.  Patient to participate in therapeutic milieu.  Sasha Rogel, MD  07/10/2016, 12:55 PM

## 2016-07-10 NOTE — Progress Notes (Signed)
Recreation Therapy Notes  Date: 07/10/16 Time: 1000 Location: 500 Hall Dayroom  Group Topic: Self-Esteem  Goal Area(s) Addresses:  Patient will identify positive ways to increase self-esteem. Patient will verbalize benefit of increased self-esteem.  Behavioral Response: Minimal  Intervention: Construction paper, markers  Activity: Personalized License Plate.  Patients were given construction paper and markers to create a license plate that highlights the positive qualities about themselves.  Patients could also highlight important dates and events that are important to them.  Education:  Self-Esteem, Building control surveyorDischarge Planning.   Education Outcome: Acknowledges education/In group clarification offered/Needs additional education  Clinical Observations/Feedback: Pt was quiet and spoke when prompted.  Pt stated he was a peaceful person and that "makes me feel everything is alright".  Pt also stated that being peaceful "helps me do things that are right and do the best I can do".    Caroll RancherMarjette Makai Agostinelli, LRT/CTRS       Caroll RancherLindsay, Tamarcus Condie A 07/10/2016 12:20 PM

## 2016-07-10 NOTE — Progress Notes (Signed)
   D:Writer introduced herself to the pt. Asked if there were any questions or concerns that hadn't been answered. Pt shook his head "no". Pt informed the rn that he didn't want to be disturbed any more tonight.  Pt refused scheduled hs meds. Pt has no questions or concerns.   A:  Support and encouragement was offered. 15 min checks continued for safety.  R: Pt remains safe.

## 2016-07-11 ENCOUNTER — Ambulatory Visit (HOSPITAL_COMMUNITY)
Admission: AD | Admit: 2016-07-11 | Discharge: 2016-07-11 | Disposition: A | Discharge status: Home / Self Care | Payer: Medicare Other | Attending: Psychiatry | Admitting: Psychiatry

## 2016-07-11 MED ORDER — LAMOTRIGINE 100 MG PO TABS
100.0000 mg | ORAL_TABLET | Freq: Every evening | ORAL | Status: DC
  Administered 2016-07-11 – 2016-07-16 (×6): 100 mg via ORAL
  Filled 2016-07-11 (×8): qty 1

## 2016-07-11 NOTE — [facility] Group Notes (Signed)
[facility] Group Notes:  (Counselor/Nursing/MHT/Case Management/Adjunct)  07/11/2016 1:15PM  Type of Therapy:  Group Therapy  Participation Level:  Active  Participation Quality:  Appropriate  Affect:  Flat  Cognitive:  Oriented  Insight:  Improving  Engagement in Group:  Limited  Engagement in Therapy:  Limited  Modes of Intervention:  Discussion, Exploration and Socialization  Summary of Progress/Problems: The topic for group was balance in life.  Pt participated in the discussion about when their life was in balance and out of balance and how this feels.  Pt discussed ways to get back in balance and short term goals they can work on to get where they want to be. Invited.  Chose to not attend.   Daryel Geraldorth, Berklee Battey B 07/11/2016 12:48 PM

## 2016-07-11 NOTE — Progress Notes (Signed)
[facility]  07/11/2016 1:25 PM David DaviesPaul Steptoe Jr.  MRN:  [id number] Subjective:  Pt today states " I am fine."    Objective:  David Davilais a 35 y.o.AA male, who is single, on SSD , who has a hx of schizophrenia and noncompliance with medications , [facility] [place] , [facility] .  Patient seen and chart reviewed.Discussed patient with treatment team.  Pt today seen as pleasant , was having his breakfast when writer attempted to evaluate pt. Pt today has been able to answer questions appropriately. Pt is vaguely paranoid on and off - is improving. Pt attends some groups . Pt per staff has had no disruptive issues on the unit.    Principal Problem: Paranoid schizophrenia (HCC) Diagnosis:   Patient Active Problem List   Diagnosis Date Noted  . TBI (traumatic brain injury) (HCC) [S06.9X9A] 07/10/2016  . Vitamin B12 deficiency [E53.8] 07/10/2016  . Noncompliance with medication regimen [Z91.14] 06/25/2016  . Cocaine use disorder, mild, abuse [F14.10] 06/25/2016  . Cannabis use disorder, mild, abuse [F12.10] 06/25/2016  . Paranoid schizophrenia (HCC) [F20.0] 06/24/2016   Total Time spent with patient: 25 minutes  Past Psychiatric History: Please see H&P.   Past Medical History:  Past Medical History:  Diagnosis Date  . Acute psychosis   . Cannabis abuse    Past  . Paranoid schizophrenia (HCC)     Family History:Please see H&P.  Family Psychiatric  History: unknown - pt unable to discuss , guardian unaware. Social History: pt has legal guardian - [employer]. History  Alcohol Use No     History  Drug Use  . Types: Cocaine, Marijuana    Social History   Social History  . Marital status: Single    Spouse name: N/A  . Number of children: N/A  . Years of education: N/A   Social History Main Topics  . Smoking status: Current Every Day Smoker    Packs/day: 1.00    Types:  Cigarettes  . Smokeless tobacco: Never Used  . Alcohol use No  . Drug use:     Types: Cocaine, Marijuana  . Sexual activity: Not Asked   Other Topics Concern  . None   Social History Narrative  . None   Additional Social History:                         Sleep: fair  Appetite: fair  Current Medications: Current Facility-Administered Medications  Medication Dose Route Frequency Provider Last Rate Last Dose  . acetaminophen (TYLENOL) tablet 650 mg  650 mg Oral Q6H PRN Charm RingsJamison Y Lord, NP      . alum & mag hydroxide-simeth (MAALOX/MYLANTA) 200-200-20 MG/5ML suspension 30 mL  30 mL Oral Q4H PRN Charm RingsJamison Y Lord, NP      . atorvastatin (LIPITOR) tablet 10 mg  10 mg Oral q1800 Jomarie LongsSaramma Makyia Erxleben, MD   10 mg at 07/10/16 1726  . cyanocobalamin ((VITAMIN B-12)) injection 1,000 mcg  1,000 mcg Intramuscular Once Jomarie LongsSaramma Erlene Devita, MD      . hydrOXYzine (ATARAX/VISTARIL) tablet 25 mg  25 mg Oral TID Charm RingsJamison Y Lord, NP   25 mg at 07/11/16 1152  . lamoTRIgine (LAMICTAL) tablet 50 mg  50 mg Oral Daily Jomarie LongsSaramma Madeleine Fenn, MD   50 mg at 07/11/16 0836  . lamoTRIgine (LAMICTAL) tablet 75 mg  75 mg Oral  QPM Jomarie Longs, MD   75 mg at 07/10/16 1726  . LORazepam (ATIVAN) tablet 1 mg  1 mg Oral Q6H PRN Jomarie Longs, MD   1 mg at 07/06/16 1453   Or  . LORazepam (ATIVAN) injection 1 mg  1 mg Intramuscular Q6H PRN Jomarie Longs, MD      . LORazepam (ATIVAN) tablet 0.5 mg  0.5 mg Oral BID Jomarie Longs, MD   0.5 mg at 07/11/16 0837  . magnesium hydroxide (MILK OF MAGNESIA) suspension 30 mL  30 mL Oral Daily PRN Charm Rings, NP      . OLANZapine zydis (ZYPREXA) disintegrating tablet 10 mg  10 mg Oral BID PRN Jackelyn Poling, NP   10 mg at 07/06/16 1455   Or  . OLANZapine (ZYPREXA) injection 10 mg  10 mg Intramuscular BID PRN Jackelyn Poling, NP   10 mg at 07/08/16 0404  . [START ON 08/13/2016] paliperidone (INVEGA SUSTENNA) injection 156 mg  156 mg Intramuscular Q28 days Jomarie Longs, MD      . [name] ON  07/15/2016] paliperidone (INVEGA SUSTENNA) injection 156 mg  156 mg Intramuscular Once Jomarie Longs, MD      . traZODone (DESYREL) tablet 150 mg  150 mg Oral QHS Jomarie Longs, MD   150 mg at 07/03/16 2237  . tuberculin injection 5 Units  5 Units Intradermal Once Jomarie Longs, MD        Lab Results: No results found for this or any previous visit (from the past 48 hour(s)).  Blood Alcohol level:  Lab Results  Component Value Date   ETH <5 06/22/2016   ETH <5 03/10/2016    Metabolic Disorder Labs: No results found for: HGBA1C, MPG No results found for: PROLACTIN No results found for: CHOL, TRIG, HDL, CHOLHDL, VLDL, LDLCALC  Physical Findings: AIMS: Facial and Oral Movements Muscles of Facial Expression: None, normal Lips and Perioral Area: None, normal Jaw: None, normal Tongue: None, normal,Extremity Movements Upper (arms, wrists, hands, fingers): None, normal Lower (legs, knees, ankles, toes): None, normal, Trunk Movements Neck, shoulders, hips: None, normal, Overall Severity Severity of abnormal movements (highest score from questions above): None, normal Incapacitation due to abnormal movements: None, normal Patient's awareness of abnormal movements (rate only patient's report): No Awareness, Dental Status Current problems with teeth and/or dentures?: No Does patient usually wear dentures?: No  CIWA:  CIWA-Ar Total: 1 COWS:  COWS Total Score: 2  Musculoskeletal: Strength & Muscle Tone: within normal limits Gait & Station: normal Patient leans: N/A  Psychiatric Specialty Exam: Physical Exam  Nursing note and vitals reviewed.   Review of Systems  Psychiatric/Behavioral: The patient is nervous/anxious.   All other systems reviewed and are negative.   Blood pressure 105/66, pulse 88, temperature 98.2 F (36.8 C), temperature source Oral, resp. rate 16, height 5\' 9"  (1.753 m), weight 59 kg (130 lb).Body mass index is 19.2 kg/m.  General Appearance: Guarded  Eye  Contact:  Fair  Speech:  Normal Rate  Volume:  Decreased  Mood:observed as   Anxious   Affect:  Congruent  Thought Process:  Goal Directed and Descriptions of Associations: Circumstantial  Orientation:  Other:  to person, situation  Thought Content:paranoia - on and off - improving  Suicidal Thoughts: denies  Homicidal Thoughts:  No  Memory:  Immediate; fair Recent; fair  Remote;  fair  Judgement:  Fair  Insight:  Fair  Psychomotor Activity:  Normal  Concentration:  Concentration: Fair and Attention Span: Fair  Recall:  Fair  [employer] of Knowledge:  Fair  Language:  Fair  Akathisia:  No  Handed:  Right  AIMS (if indicated):     Assets:  Communication Skills Social Support  ADL's:  Intact  Cognition:  WNL  Sleep:  Number of Hours: 6.25   07/02/16 [facility] I have reviewed it- patient [facility]-01/29/16. Pt as per records was noncompliant with out patient follow ups or medications zyprexa, lexapro - was [facility] from prison in November 2016. Pt also was abusing cocaine, cannabis . He presented with psychosis , mood lability - responded well to invega - was given invega sustenna IM. He was also found to have Vitamin b12 deficiency - was started Vitamin b12 replacement. Pt was discharged to his guardian who found placement. His family hx is unknown , he was adopted as a child, went up to 9 th grade.    07/09/16 Writer [facility]  Patient admitted there several times - most recently 07/29/15- 10/11/15- he walked in with c/o psychosis.  That was patient's 17 admission there .  The previous admission was from 04/2012 to may 2015 - for incapable to proceed status on charges of breaking and entering.During his stay at that time he stayed for sometime in solitary confinement. During his recent admission there - he was started on Zyprexa and lexapro - discharged on  the same - zyprexa 15 mg and lexapro 10 mg. Pt 's discharge diagnosis was schizophrenia multiple episodes , cannabis use do, stimulant use do , tobacco use do, TBI in 2002, vitamin D deficiency and impaired visual acuity.          Treatment Plan Summary: Patient today seen as calm , pleasant , vaguely paranoid in some social settings. Pt continues to be on forced nonemergent medications - renewed 07/08/16 - valid for 7 days. [facility] with legal guardian if he continues to be stable.  Paranoid schizophrenia (HCC) unstable - improving  Will continue today 07/11/16  plan as below except where it is noted.  Daily contact with patient to assess and evaluate symptoms and progress in treatment and Medication management For psychosis: Invega sustenna IM 234 mg x 1 dose - 07/09/16, Invega sustenna IM 156 mg IM - next dose - 07/15/16 , then repeat 156 mg IM q 28 days.  For mood lability: Will increase Lamictal  To 50 mg po daily and 100  mg po qpm. Will continue  Ativan 0.5 mg po bid.  For insomina: Will continue Trazodone  150 mg po qhs for sleep.  For agitation/anxiety: Continue PRN medications as per agitation protocol.  For Vitamin b12 deficiency: Continue Vitamin b12 1000 mcg daily po.  Will continue to monitor vitals ,medication compliance and treatment side effects while patient is here.   Will monitor for medical issues as well as call consult as needed.   Reviewed labs - pt refused-  TSH, Lipid panel, hba1c, pl, vitamin b12- Was re ordered - pt refused again .  Pt refused EKG for qtc monitoring again .  PPD offered- 06/26/16 , reordered again 07/10/16- pt refused.  CSW will continue  working on disposition.[facility]  [facility].If improves - can be discharged with legal guardian.  Patient to participate in therapeutic milieu.  Yvone Slape, MD  07/11/2016, 1:25 PM

## 2016-07-11 NOTE — Progress Notes (Addendum)
       Patient left [facility] at 1020 and [facility] workers and 2 policemen.  Patient had refused PPD and xray needed for [facility]'s placement [facility].  Patient took his morning medications without difficulty.  Respirations even and unlabored.  No signs/symptoms of pain/distress noted on patient's face/body movements.  Safety maintained with 15 minute checks.  [facility]55 with EMS and policemen.  Patient refused to fill out and refused to answer questions for nurse to fill out the self inventory sheet.  Patient has stayed in his room majority of the day.

## 2016-07-11 NOTE — Progress Notes (Signed)
Recreation Therapy Notes  Date: 07/11/16 Time: 1000 Location: 500 Hall Dayroom  Group Topic: Stress Management  Goal Area(s) Addresses:  Patient will verbalize importance of using healthy stress management.  Patient will identify positive emotions associated with healthy stress management.   Intervention: Calm App, Deep Breathing Script  Activity :  Deep Breathing, Body Scan Meditation.  LRT introduced the stress management techniques of deep breathing and meditation.  LRT read a script and played a recording to allow the patients to engage in the techniques.  Patients were to follow along to with the script and recording to engage in the activity.  Education:  Stress Management, Discharge Planning.   Education Outcome: Acknowledges edcuation/In group clarification offered/Needs additional education  Clinical Observations/Feedback:  Pt did not attend group.   Caroll RancherMarjette Tomoya Ringwald, LRT/CTRS         Lillia AbedLindsay, Thadd Apuzzo A 07/11/2016 1:00 PM

## 2016-07-11 NOTE — Plan of Care (Signed)
Problem: Coping: Goal: Ability to identify and develop effective coping behavior will improve Outcome: Not Progressing Nurse discussed depression/coping skills with patient.

## 2016-07-11 NOTE — [facility] Group Notes (Signed)
The focus of this group is to educate the patient on the purpose and policies of crisis stabilization and provide a format to answer questions about their admission.  The group details unit policies and expectations of patients while admitted.  Patient did not attend 0900 nurse education orientation group this morning.  Patient stayed in bed.   

## 2016-07-12 NOTE — Progress Notes (Signed)
Patient ID: David Daviesaul Stanke Jr., male   DOB: 09/12/1980, 35 y.o.   MRN: [id number]  D: Client isolates, in room this shift, intense stare when writer enters the room, does not initiate any conversation, "hey" client does not like anyone in the room and paces back and forth in the room. A: Writer attempts to interact with client, he is not receptive. Encouraged medications. Staff will monitor q7615min for safety. R: Client is safe on the unit.

## 2016-07-12 NOTE — [facility] Group Notes (Signed)
[facility] LCSW Group Therapy  07/12/2016  1:05 PM  Type of Therapy:  Group therapy  Participation Level:  Active  Participation Quality:  Attentive  Affect:  Flat  Cognitive:  Oriented  Insight:  Limited  Engagement in Therapy:  Limited  Modes of Intervention:  Discussion, Socialization  Summary of Progress/Problems:  Chaplain was here to lead a group on themes of hope and courage. Invited.  Chose to not attend.  Daryel Geraldorth, Albena Comes B 07/12/2016 1:21 PM

## 2016-07-12 NOTE — Progress Notes (Signed)
Recreation Therapy Notes  Date: 07/12/16 Time: 1000 Location: 500 Hall Dayroom  Group Topic: Communication, Team Building, Problem Solving  Goal Area(s) Addresses:  Patient will effectively work with peer towards shared goal.  Patient will identify skill used to make activity successful.  Patient will identify how skills used during activity can be used to reach post d/c goals.   Intervention: STEM Activity   Activity: [place] [place]. Patients were provided the following materials: 5 drinking straws, 5 rubber bands, 5 paper clips, 2 index cards, 2 drinking cups, and 2 toilet paper rolls. Using the provided materials patients were asked to build a launching mechanisms to launch a ping pong ball approximately 12 feet. Patients were divided into teams of 3-5.   Education: Pharmacist, communityocial Skills, Building control surveyorDischarge Planning.   Education Outcome: Acknowledges education/In group clarification offered/Needs additional education.   Clinical Observations/Feedback: Pt did not attend group.    Caroll RancherMarjette Demetrica Zipp, LRT/CTRS         Caroll RancherLindsay, Arno Cullers A 07/12/2016 12:56 PM

## 2016-07-12 NOTE — Progress Notes (Signed)
Did not attend group 

## 2016-07-12 NOTE — Progress Notes (Signed)
  D: Pt didn't look at the writer during the assessment. Asked the writer "why do you keep coming in here?' Explained that staff has to do checks on all the patients. Informed the writer to ask all questions at one time, so he wouldn't have to be disturbed. Pt continues to refuse scheduled hs med. Pt has no other questions or concerns.  A:  Support and encouragement was offered. 15 min checks continued for safety.  R: Pt remains safe.

## 2016-07-12 NOTE — Progress Notes (Signed)
D: Pt presents animated and anxious. Pt A & O to self and place ("hospital"). Denies SI, HI, AVH and pain when assessed. Conversation this morning was more logical / coherent. Still remains paranoid, "refused to take a sterile foam cup from writer because he believed "you put something in it, just put the gatorade in my cup, with the coke in it, I don't mind". Pt is guarded and isolative to room majority of this shift. Interactions is on as need basis. However he's been cooperative with care thus far. A: Scheduled medications administered as prescribed with verbal education. Emotional support and availability provided to pt. Writer encouraged pt to attend unit groups and comply with current medication regimen. Q 15 minutes safety checks maintained without outburst or self harm gestures thus far.  R: Pt is receptive to care. He's edication compliant. Tolerates all PO intake well. Remains safe on unit. POC maintained for safety and mood stability.

## 2016-07-12 NOTE — Progress Notes (Signed)
Adult Psychoeducational Group Note  Date:  07/12/2016 Time:  2:16 AM  Group Topic/Focus:  Wrap-Up Group:   The focus of this group is to help patients review their daily goal of treatment and discuss progress on daily workbooks.   Participation Level:  Did Not Attend  Participation Quality:  Did not attend  Affect:  Did not attend  Cognitive:  Did not attend  Insight: None  Engagement in Group:  Did not attend  Modes of Intervention:  Did not attend  Additional Comments: Patient did not attend wrap up group this evening.  Sayra Frisby L Makenzie Vittorio

## 2016-07-12 NOTE — Progress Notes (Signed)
[facility]  07/12/2016 2:46 PM Simmie DaviesPaul Sage Jr.  MRN:  [id number] Subjective:  Pt today states " I am ok."     Objective:  Rafael Bihariaul Richter Jr.is a 35 y.o.AA male, who is single, on SSD , who has a hx of schizophrenia and noncompliance with medications , [facility] [place] , [facility] .  Patient seen and chart reviewed.Discussed patient with treatment team.  Pt today seen as calm , cooperative, denies any new concerns. Pt often isolative - but does attend groups when encouraged to. Pt per staff has had no disruptive issues . Pt tolerated first dose of Invega sustenna IM - next dose Monday. Will continue to treatment. CS[facility] - [facility] made .    Principal Problem: Paranoid schizophrenia (HCC) Diagnosis:   Patient Active Problem List   Diagnosis Date Noted  . TBI (traumatic brain injury) (HCC) [S06.9X9A] 07/10/2016  . Vitamin B12 deficiency [E53.8] 07/10/2016  . Noncompliance with medication regimen [Z91.14] 06/25/2016  . Cocaine use disorder, mild, abuse [F14.10] 06/25/2016  . Cannabis use disorder, mild, abuse [F12.10] 06/25/2016  . Paranoid schizophrenia (HCC) [F20.0] 06/24/2016   Total Time spent with patient: 20 minutes  Past Psychiatric History: Please see H&P.   Past Medical History:  Past Medical History:  Diagnosis Date  . Acute psychosis   . Cannabis abuse    Past  . Paranoid schizophrenia (HCC)     Family History:Please see H&P.  Family Psychiatric  History: unknown - pt unable to discuss , guardian unaware. Social History: pt has legal guardian - [employer] [facility] [place]. History  Alcohol Use No     History  Drug Use  . Types: Cocaine, Marijuana    Social History   Social History  . Marital status: Single    Spouse name: N/A  . Number of children: N/A  . Years of education: N/A   Social History Main Topics  . Smoking status:  Current Every Day Smoker    Packs/day: 1.00    Types: Cigarettes  . Smokeless tobacco: Never Used  . Alcohol use No  . Drug use:     Types: Cocaine, Marijuana  . Sexual activity: Not Asked   Other Topics Concern  . None   Social History Narrative  . None   Additional Social History:                         Sleep: fair  Appetite: fair  Current Medications: Current Facility-Administered Medications  Medication Dose Route Frequency Provider Last Rate Last Dose  . acetaminophen (TYLENOL) tablet 650 mg  650 mg Oral Q6H PRN Charm RingsJamison Y Lord, NP      . alum & mag hydroxide-simeth (MAALOX/MYLANTA) 200-200-20 MG/5ML suspension 30 mL  30 mL Oral Q4H PRN Charm RingsJamison Y Lord, NP      . atorvastatin (LIPITOR) tablet 10 mg  10 mg Oral q1800 Jomarie LongsSaramma Mariafernanda Hendricksen, MD   10 mg at 07/11/16 1841  . cyanocobalamin ((VITAMIN B-12)) injection 1,000 mcg  1,000 mcg Intramuscular Once Jomarie LongsSaramma Reia Viernes, MD      . hydrOXYzine (ATARAX/VISTARIL) tablet 25 mg  25 mg Oral TID Charm RingsJamison Y Lord, NP   25 mg at 07/12/16 1213  . lamoTRIgine (LAMICTAL) tablet 100 mg  100 mg Oral QPM Jacobey Gura, MD   100 mg at 07/11/16 1842  .  lamoTRIgine (LAMICTAL) tablet 50 mg  50 mg Oral Daily Jomarie Longs, MD   50 mg at 07/12/16 1610  . LORazepam (ATIVAN) tablet 1 mg  1 mg Oral Q6H PRN Jomarie Longs, MD   1 mg at 07/06/16 1453   Or  . LORazepam (ATIVAN) injection 1 mg  1 mg Intramuscular Q6H PRN Jomarie Longs, MD      . LORazepam (ATIVAN) tablet 0.5 mg  0.5 mg Oral BID Jomarie Longs, MD   0.5 mg at 07/12/16 9604  . magnesium hydroxide (MILK OF MAGNESIA) suspension 30 mL  30 mL Oral Daily PRN Charm Rings, NP      . OLANZapine zydis (ZYPREXA) disintegrating tablet 10 mg  10 mg Oral BID PRN Jackelyn Poling, NP   10 mg at 07/06/16 1455   Or  . OLANZapine (ZYPREXA) injection 10 mg  10 mg Intramuscular BID PRN Jackelyn Poling, NP   10 mg at 07/08/16 0404  . [START ON 08/13/2016] paliperidone (INVEGA SUSTENNA) injection 156 mg  156 mg  Intramuscular Q28 days Jomarie Longs, MD      . [name] ON 07/15/2016] paliperidone (INVEGA SUSTENNA) injection 156 mg  156 mg Intramuscular Once Jomarie Longs, MD      . traZODone (DESYREL) tablet 150 mg  150 mg Oral QHS Jomarie Longs, MD   150 mg at 07/03/16 2237  . tuberculin injection 5 Units  5 Units Intradermal Once Jomarie Longs, MD        Lab Results: No results found for this or any previous visit (from the past 48 hour(s)).  Blood Alcohol level:  Lab Results  Component Value Date   ETH <5 06/22/2016   ETH <5 03/10/2016    Metabolic Disorder Labs: No results found for: HGBA1C, MPG No results found for: PROLACTIN No results found for: CHOL, TRIG, HDL, CHOLHDL, VLDL, LDLCALC  Physical Findings: AIMS: Facial and Oral Movements Muscles of Facial Expression: None, normal Lips and Perioral Area: None, normal Jaw: None, normal Tongue: None, normal,Extremity Movements Upper (arms, wrists, hands, fingers): None, normal Lower (legs, knees, ankles, toes): None, normal, Trunk Movements Neck, shoulders, hips: None, normal, Overall Severity Severity of abnormal movements (highest score from questions above): None, normal Incapacitation due to abnormal movements: None, normal Patient's awareness of abnormal movements (rate only patient's report): No Awareness, Dental Status Current problems with teeth and/or dentures?: No Does patient usually wear dentures?: No  CIWA:  CIWA-Ar Total: 1 COWS:  COWS Total Score: 2  Musculoskeletal: Strength & Muscle Tone: within normal limits Gait & Station: normal Patient leans: N/A  Psychiatric Specialty Exam: Physical Exam  Nursing note and vitals reviewed.   Review of Systems  Psychiatric/Behavioral: The patient is nervous/anxious.   All other systems reviewed and are negative.   Blood pressure (!) 98/58, pulse 79, temperature 98.3 F (36.8 C), temperature source Oral, resp. rate 16, height 5\' 9"  (1.753 m), weight 59 kg (130 lb).Body  mass index is 19.2 kg/m.  General Appearance: Guarded  Eye Contact:  Fair  Speech:  Normal Rate  Volume:  Decreased  Mood:observed as   Anxious   Affect:  Congruent  Thought Process:  Goal Directed and Descriptions of Associations: Circumstantial  Orientation:  Other:  to person, situation  Thought Content:paranoia - on and off - improving  Suicidal Thoughts: denies  Homicidal Thoughts:  No  Memory:  Immediate; fair Recent; fair  Remote;  fair  Judgement:  Fair  Insight:  Fair  Psychomotor Activity:  Normal  Concentration:  Concentration: Fair and Attention Span: Fair  Recall:  F[employer]Fair  Fund of Knowledge:  Fair  Language:  Fair  Akathisia:  No  Handed:  Right  AIMS (if indicated):     Assets:  Communication Skills Social Support  ADL's:  Intact  Cognition:  WNL  Sleep:  Number of Hours: 6.25   07/02/16 [facility] I have reviewed it- [facility]. Pt as per records was noncompliant with out patient follow ups or medications zyprexa, lexapro - was discharged from prison in November 2016. Pt also was abusing cocaine, cannabis . He presented with psychosis , mood lability - responded well to invega - was given invega sustenna IM. He was also found to have Vitamin b12 deficiency - was started Vitamin b12 replacement. Pt was discharged to his guardian who found placement. His family hx is unknown , he was adopted as a child, went up to 9 th grade.    07/09/16 Writer [facility]  Patient [facility] several times - most recently 07/29/15- 10/11/15- he walked in with c/o psychosis.  That was patient's 17 admission there .  The previous admission was from 04/2012 to may 2015 - for incapable to proceed status on charges of breaking and entering.During his stay at that time he stayed for sometime in solitary confinement. During his recent admission  there - he was started on Zyprexa and lexapro - discharged on the same - zyprexa 15 mg and lexapro 10 mg. Pt 's discharge diagnosis was schizophrenia multiple episodes , cannabis use do, stimulant use do , tobacco use do, TBI in 2002, vitamin D deficiency and impaired visual acuity.          Treatment Plan Summary: Patient today continues to be calm and cooperative.  Pt continues to be on forced nonemergent medications - renewed 07/08/16 - valid for 7 days. [facility] with legal guardian if he continues to be stable.  Paranoid schizophrenia (HCC) unstable - improving  Will continue today 07/12/16  plan as below except where it is noted.  Daily contact with patient to assess and evaluate symptoms and progress in treatment and Medication management For psychosis: Invega sustenna IM 234 mg x 1 dose - 07/09/16, Invega sustenna IM 156 mg IM - next dose - 07/15/16 , then repeat 156 mg IM q 28 days.  For mood lability: Increased Lamictal  to 50 mg po daily and 100  mg po qpm. Will continue  Ativan 0.5 mg po bid.  For insomina: Will continue Trazodone  150 mg po qhs for sleep.  For agitation/anxiety: Continue PRN medications as per agitation protocol.  For Vitamin b12 deficiency: Continue Vitamin b12 1000 mcg daily po.  Will continue to monitor vitals ,medication compliance and treatment side effects while patient is here.   Will monitor for medical issues as well as call consult as needed.   Reviewed labs - pt refused-  TSH, Lipid panel, hba1c, pl, vitamin b12- Was re ordered - pt refused again .  Pt refused EKG for qtc monitoring again .  PPD offered- 06/26/16 , reordered again 07/10/16- pt refused.  CSW will continue  working on disposition.[facility]  [facility].If improves - can be discharged with legal guardian.  Patient to participate in therapeutic milieu.  Chevelle Coulson, MD  07/12/2016, 2:46 PM

## 2016-07-13 NOTE — Progress Notes (Signed)
DAR NOTE: Patient presents with flat affect and depressed mood.  Denies pain, auditory and visual hallucinations.  Rates depression at 0, hopelessness at 0, and anxiety at 0.  Maintained on routine safety checks.  Medications given as prescribed.  Support and encouragement offered as needed.  Attended group and participated.  Patient remained withdrawn and isolative.  Offered no complaint.    

## 2016-07-13 NOTE — Progress Notes (Signed)
[facility]  07/13/2016 4:29 PM David DaviesPaul Remmers Jr.  MRN:  [id number]  Subjective:  Pt today states " I am ok."  Objective:  David Davilais a 35 y.o.AA male, who is single, on SSD , who has a hx of schizophrenia and noncompliance with medications , [facility] [place], [facility] [employer] [facility].  Patient seen and chart reviewed.Discussed patient with treatment team.  Pt today seen as calm , cooperative, denies any new concerns. Pt often isolative - but does attend groups when encouraged to. He says does not go to some groups when he feels he will be aggravated by people. Pt per staff has had no disruptive issues . Pt tolerated first dose of Invega sustenna IM - next dose Monday. Will continue to treatment. CS[facility] - referrals made .    Principal Problem: Paranoid schizophrenia (HCC) Diagnosis:   Patient Active Problem List   Diagnosis Date Noted  . TBI (traumatic brain injury) (HCC) [S06.9X9A] 07/10/2016  . Vitamin B12 deficiency [E53.8] 07/10/2016  . Noncompliance with medication regimen [Z91.14] 06/25/2016  . Cocaine use disorder, mild, abuse [F14.10] 06/25/2016  . Cannabis use disorder, mild, abuse [F12.10] 06/25/2016  . Paranoid schizophrenia (HCC) [F20.0] 06/24/2016   Total Time spent with patient: 15 minutes  Past Psychiatric History: Please see H&P.   Past Medical History:  Past Medical History:  Diagnosis Date  . Acute psychosis   . Cannabis abuse    Past  . Paranoid schizophrenia (HCC)     Family History: Please see H&P.  Family Psychiatric  History: unknown - pt unable to discuss, guardian unaware.  Social History: pt has legal guardian - [employer] [facility] [place]. History  Alcohol Use No     History  Drug Use  . Types: Cocaine, Marijuana    Social History   Social History  . Marital status: Single    Spouse name: N/A  . Number of children: N/A   . Years of education: N/A   Social History Main Topics  . Smoking status: Current Every Day Smoker    Packs/day: 1.00    Types: Cigarettes  . Smokeless tobacco: Never Used  . Alcohol use No  . Drug use:     Types: Cocaine, Marijuana  . Sexual activity: Not Asked   Other Topics Concern  . None   Social History Narrative  . None   Additional Social History:   Sleep: Fair  Appetite: fair  Current Medications: Current Facility-Administered Medications  Medication Dose Route Frequency Provider Last Rate Last Dose  . acetaminophen (TYLENOL) tablet 650 mg  650 mg Oral Q6H PRN Charm RingsJamison Y Lord, NP      . alum & mag hydroxide-simeth (MAALOX/MYLANTA) 200-200-20 MG/5ML suspension 30 mL  30 mL Oral Q4H PRN Charm RingsJamison Y Lord, NP      . atorvastatin (LIPITOR) tablet 10 mg  10 mg Oral q1800 Jomarie LongsSaramma Eappen, MD   10 mg at 07/12/16 1717  . cyanocobalamin ((VITAMIN B-12)) injection 1,000 mcg  1,000 mcg Intramuscular Once Jomarie LongsSaramma Eappen, MD      . hydrOXYzine (ATARAX/VISTARIL) tablet 25 mg  25 mg Oral TID Charm RingsJamison Y Lord, NP   25 mg at 07/13/16 1203  . lamoTRIgine (LAMICTAL) tablet 100 mg  100 mg Oral QPM Saramma Eappen, MD   100 mg at 07/12/16 1717  . lamoTRIgine (LAMICTAL) tablet 50 mg  50 mg  Oral Daily Jomarie Longs, MD   50 mg at 07/13/16 0839  . LORazepam (ATIVAN) tablet 1 mg  1 mg Oral Q6H PRN Jomarie Longs, MD   1 mg at 07/06/16 1453   Or  . LORazepam (ATIVAN) injection 1 mg  1 mg Intramuscular Q6H PRN Jomarie Longs, MD      . LORazepam (ATIVAN) tablet 0.5 mg  0.5 mg Oral BID Jomarie Longs, MD   0.5 mg at 07/13/16 0839  . magnesium hydroxide (MILK OF MAGNESIA) suspension 30 mL  30 mL Oral Daily PRN Charm Rings, NP      . OLANZapine zydis (ZYPREXA) disintegrating tablet 10 mg  10 mg Oral BID PRN Jackelyn Poling, NP   10 mg at 07/06/16 1455   Or  . OLANZapine (ZYPREXA) injection 10 mg  10 mg Intramuscular BID PRN Jackelyn Poling, NP   10 mg at 07/08/16 0404  . [START ON 08/13/2016]  paliperidone (INVEGA SUSTENNA) injection 156 mg  156 mg Intramuscular Q28 days Jomarie Longs, MD      . [name] ON 07/15/2016] paliperidone (INVEGA SUSTENNA) injection 156 mg  156 mg Intramuscular Once Jomarie Longs, MD      . traZODone (DESYREL) tablet 150 mg  150 mg Oral QHS Jomarie Longs, MD   150 mg at 07/03/16 2237  . tuberculin injection 5 Units  5 Units Intradermal Once Jomarie Longs, MD        Lab Results: No results found for this or any previous visit (from the past 48 hour(s)).  Blood Alcohol level:  Lab Results  Component Value Date   ETH <5 06/22/2016   ETH <5 03/10/2016   Metabolic Disorder Labs: No results found for: HGBA1C, MPG No results found for: PROLACTIN No results found for: CHOL, TRIG, HDL, CHOLHDL, VLDL, LDLCALC  Physical Findings: AIMS: Facial and Oral Movements Muscles of Facial Expression: None, normal Lips and Perioral Area: None, normal Jaw: None, normal Tongue: None, normal,Extremity Movements Upper (arms, wrists, hands, fingers): None, normal Lower (legs, knees, ankles, toes): None, normal, Trunk Movements Neck, shoulders, hips: None, normal, Overall Severity Severity of abnormal movements (highest score from questions above): None, normal Incapacitation due to abnormal movements: None, normal Patient's awareness of abnormal movements (rate only patient's report): No Awareness, Dental Status Current problems with teeth and/or dentures?: No Does patient usually wear dentures?: No  CIWA:  CIWA-Ar Total: 1 COWS:  COWS Total Score: 2  Musculoskeletal: Strength & Muscle Tone: within normal limits Gait & Station: normal Patient leans: N/A  Psychiatric Specialty Exam: Physical Exam  Nursing note and vitals reviewed.   Review of Systems  Psychiatric/Behavioral: The patient is nervous/anxious.   All other systems reviewed and are negative.   Blood pressure (!) 93/49, pulse 82, temperature 98.3 F (36.8 C), temperature source Oral, resp. rate  16, height 5\' 9"  (1.753 m), weight 59 kg (130 lb).Body mass index is 19.2 kg/m.  General Appearance: Guarded  Eye Contact:  Fair  Speech:  Normal Rate  Volume:  Decreased  Mood:observed as   Anxious   Affect:  Congruent  Thought Process:  Goal Directed and Descriptions of Associations: Circumstantial  Orientation:  Other:  to person, situation  Thought Content:paranoia - on and off - improving  Suicidal Thoughts: denies  Homicidal Thoughts:  No  Memory:  Immediate; fair Recent; fair  Remote;  fair  Judgement:  Fair  Insight:  Fair  Psychomotor Activity:  Normal  Concentration:  Concentration: Fair and Attention Span: Fair  Recall:  David Davila  Fund of Knowledge:  Fair  Language:  Fair  Akathisia:  No  Handed:  Right  AIMS (if indicated):     Assets:  Communication Skills Social Support  ADL's:  Intact  Cognition:  WNL  Sleep:  Number of Hours: 6.75   07/02/16 [facility] I have reviewed it- [facility]. Pt as per records was noncompliant with out patient follow ups or medications zyprexa, lexapro - was discharged from prison in November 2016. Pt also was abusing cocaine, cannabis . He presented with psychosis , mood lability - responded well to invega - was given invega sustenna IM. He was also found to have Vitamin b12 deficiency - was started Vitamin b12 replacement. Pt was discharged to his guardian who found placement. His family hx is unknown , he was adopted as a child, went up to 9 th grade.   07/09/16 Writer [facility] [place]  Patient admitted there several times - most recently 07/29/15- 10/11/15- he walked in with c/o psychosis.  That was patient's 17 admission there .  The previous admission was from 04/2012 to may 2015 - for incapable to proceed status on charges of breaking and entering.During his stay at that time he stayed for sometime  in solitary confinement. During his recent admission there - he was started on Zyprexa and lexapro - discharged on the same - zyprexa 15 mg and lexapro 10 mg. Pt 's discharge diagnosis was schizophrenia multiple episodes , cannabis use do, stimulant use do , tobacco use do, TBI in 2002, vitamin D deficiency and impaired visual acuity.   Treatment Plan Summary: Patient today continues to be calm and cooperative.  Pt continues to be on forced nonemergent medications - renewed 07/08/16 - valid for 7 days. [facility] if he continues to be stable.  Paranoid schizophrenia (HCC) unstable - improving some  Will continue today 07/13/16  plan as below except where it is noted.  Daily contact with patient to assess and evaluate symptoms and progress in treatment and Medication management For psychosis: Invega sustenna IM 234 mg x 1 dose - 07/09/16, Invega sustenna IM 156 mg IM - next dose - 07/15/16, then repeat 156 mg IM q 28 days.  For mood lability: Continue Lamictal 50 mg po daily and 100  mg po qpm. Will continue  Ativan 0.5 mg po bid.  For insomina: Will continue Trazodone 150 mg po qhs for sleep.  For agitation/anxiety: Continue PRN medications as per agitation protocol.  For Vitamin b12 deficiency: Continue Vitamin b12 1000 mcg daily po.  Will continue to monitor vitals ,medication compliance and treatment side effects while patient is here.   Will monitor for medical issues as well as call consult as needed.   Reviewed labs - pt refused-  TSH, Lipid panel, hba1c, pl, vitamin b12- Was re ordered - pt refused again .  Pt refused EKG for qtc monitoring again .  PPD offered- 06/26/16 , reordered again 07/10/16- pt refused.  CSW will continue  working on disposition.[facility]  [facility].If improves - can be discharged with legal guardian.  Patient to participate in therapeutic milieu. No changes  made on the current plan of care. Continue treatment plan.  Sanjuana KavaNwoko, Agnes I, NP, PMHNP, FNP-BC. 07/13/2016, 4:29 PM   I agree to the findings and  plan.

## 2016-07-13 NOTE — Plan of Care (Signed)
Problem: Safety: Goal: Ability to demonstrate self-control will improve Outcome: Progressing Client is safe on the unit, AEB q115mins safety checks, no evidence of self project injury or injury to others.

## 2016-07-13 NOTE — [facility] Group Notes (Signed)
[facility] Group Notes: (Clinical Social Work)   07/13/2016      Type of Therapy:  Group Therapy   Participation Level:  Did Not Attend despite MHT prompting   Ambrose MantleMareida Grossman-Orr, LCSW 07/13/2016, 12:28 PM

## 2016-07-13 NOTE — Progress Notes (Signed)
D    Pt is irritable and paranoid    He was observed posturing in the hallway at the beginning of the shift    He is irritable and agitated at the MHT for going in his room doing the checks   Pt slammed the door when he went in his room   Pt was given PRN Ativan and Zyprexa with his nighttime medications   It appeared he may have cheeked the medications  A    Verbal support given   Medications administered and effectiveness monitored   Q 15 min checks    Redirection as needed R   Pt is safe at present time

## 2016-07-14 NOTE — Progress Notes (Signed)
DAR Note: DAR NOTE: Patient presents with a bright affect and calm mood.  Denies pain, auditory and visual hallucinations.  Rates depression at 0, hopelessness at 0, and anxiety at 0.  Maintained on routine safety checks.  Medications given as prescribed.  Support and encouragement offered as needed. Patient observed socializing with peers in the dayroom.  Offered no complaint.

## 2016-07-14 NOTE — Progress Notes (Signed)
Psychoeducational Group Note  Date:  07/14/2016 Time: 2111  Group Topic/Focus:  Wrap-Up Group:   The focus of this group is to help patients review their daily goal of treatment and discuss progress on daily workbooks.   Participation Level: Did Not Attend  Participation Quality:  Not Applicable  Affect:  Not Applicable  Cognitive:  Not Applicable  Insight:  Not Applicable  Engagement in Group: Not Applicable  Additional Comments:  The patient did not attend group since he was asleep in his bedroom.    Hazle CocaGOODMAN, Cailynn Bodnar S 07/14/2016, 9:11 PM

## 2016-07-14 NOTE — Progress Notes (Signed)
D   Pt isolates to his room   He did come out for snack but does not interact with others and mostly stays in his room A    Verbal support given   Medications administered and effectiveness monitored    Q 15 min checks R   Pt is safe at present

## 2016-07-14 NOTE — Progress Notes (Signed)
[facility] MD Progress Note  07/14/2016 2:29 PM David Davila.  MRN:  [id number]  Subjective:  Patient states "Do not ask me any questions today. Just ask me how I'm doing that is enough for me. Please, do not give me Zyprexa; the white pill. It tastes like poison. I prefer Zyprexa Zydis, it helps me" "  Objective:  David Davilais a 35 y.o.AA male, who is single, on SSD , who has a hx of schizophrenia and noncompliance with medications , used to live at a [facility] in [place], was brought in by [employer] to [facility] from a night club for disorganized behavior.  Patient seen and chart reviewed. Discussed patient with the treatment team.  Pt today seen as calm, not very cooperative. Presents very psychotic. He is pacing up & the hall-ways. He remains often isolative - but does attend groups when encouraged to. He says does not go to some groups when he feels he will be aggravated by people. Pt per staff has had no disruptive issues . Pt tolerated first dose of Invega sustenna IM - next dose Monday. Will continue to treatment. CSW coordinating with legal guardian for [facility] placement - referrals made .   Principal Problem: Paranoid schizophrenia (HCC) Diagnosis:   Patient Active Problem List   Diagnosis Date Noted  . TBI (traumatic brain injury) (HCC) [S06.9X9A] 07/10/2016  . Vitamin B12 deficiency [E53.8] 07/10/2016  . Noncompliance with medication regimen [Z91.14] 06/25/2016  . Cocaine use disorder, mild, abuse [F14.10] 06/25/2016  . Cannabis use disorder, mild, abuse [F12.10] 06/25/2016  . Paranoid schizophrenia (HCC) [F20.0] 06/24/2016   Total Time spent with patient: 15 minutes  Past Psychiatric History: Please see H&P.   Past Medical History:  Past Medical History:  Diagnosis Date  . Acute psychosis   . Cannabis abuse    Past  . Paranoid schizophrenia (HCC)     Family History: Please see H&P.  Family Psychiatric  History: unknown - pt unable to discuss, guardian unaware.  Social History:  pt has legal guardian - [facility] [place]. History  Alcohol Use No     History  Drug Use  . Types: Cocaine, Marijuana    Social History   Social History  . Marital status: Single    Spouse name: N/A  . Number of children: N/A  . Years of education: N/A   Social History Main Topics  . Smoking status: Current Every Day Smoker    Packs/day: 1.00    Types: Cigarettes  . Smokeless tobacco: Never Used  . Alcohol use No  . Drug use:     Types: Cocaine, Marijuana  . Sexual activity: Not Asked   Other Topics Concern  . None   Social History Narrative  . None   Additional Social History:   Sleep: Fair  Appetite: fair  Current Medications: Current Facility-Administered Medications  Medication Dose Route Frequency Provider Last Rate Last Dose  . acetaminophen (TYLENOL) tablet 650 mg  650 mg Oral Q6H PRN Charm Rings, NP      . alum & mag hydroxide-simeth (MAALOX/MYLANTA) 200-200-20 MG/5ML suspension 30 mL  30 mL Oral Q4H PRN Charm Rings, NP      . atorvastatin (LIPITOR) tablet 10 mg  10 mg Oral q1800 Jomarie Longs, MD   10 mg at 07/13/16 1717  . cyanocobalamin ((VITAMIN B-12)) injection 1,000 mcg  1,000 mcg Intramuscular Once Jomarie Longs, MD      . hydrOXYzine (ATARAX/VISTARIL) tablet 25 mg  25 mg Oral TID  Charm Rings, NP   25 mg at 07/14/16 1110  . lamoTRIgine (LAMICTAL) tablet 100 mg  100 mg Oral QPM Jomarie Longs, MD   100 mg at 07/13/16 1717  . lamoTRIgine (LAMICTAL) tablet 50 mg  50 mg Oral Daily Jomarie Longs, MD   50 mg at 07/14/16 0811  . LORazepam (ATIVAN) tablet 1 mg  1 mg Oral Q6H PRN Jomarie Longs, MD   1 mg at 07/13/16 2219   Or  . LORazepam (ATIVAN) injection 1 mg  1 mg Intramuscular Q6H PRN Jomarie Longs, MD      . LORazepam (ATIVAN) tablet 0.5 mg  0.5 mg Oral BID Jomarie Longs, MD   0.5 mg at 07/14/16 0811  . magnesium hydroxide (MILK OF MAGNESIA) suspension 30 mL  30 mL Oral Daily PRN Charm Rings, NP      . OLANZapine zydis (ZYPREXA)  disintegrating tablet 10 mg  10 mg Oral BID PRN Jackelyn Poling, NP   10 mg at 07/13/16 2218   Or  . OLANZapine (ZYPREXA) injection 10 mg  10 mg Intramuscular BID PRN Jackelyn Poling, NP   10 mg at 07/08/16 0404  . [START ON 08/13/2016] paliperidone (INVEGA SUSTENNA) injection 156 mg  156 mg Intramuscular Q28 days Jomarie Longs, MD      . [name] ON 07/15/2016] paliperidone (INVEGA SUSTENNA) injection 156 mg  156 mg Intramuscular Once Jomarie Longs, MD      . traZODone (DESYREL) tablet 150 mg  150 mg Oral QHS Jomarie Longs, MD   150 mg at 07/13/16 2218  . tuberculin injection 5 Units  5 Units Intradermal Once Jomarie Longs, MD        Lab Results: No results found for this or any previous visit (from the past 48 hour(s)).  Blood Alcohol level:  Lab Results  Component Value Date   ETH <5 06/22/2016   ETH <5 03/10/2016   Metabolic Disorder Labs: No results found for: HGBA1C, MPG No results found for: PROLACTIN No results found for: CHOL, TRIG, HDL, CHOLHDL, VLDL, LDLCALC  Physical Findings: AIMS: Facial and Oral Movements Muscles of Facial Expression: None, normal Lips and Perioral Area: None, normal Jaw: None, normal Tongue: None, normal,Extremity Movements Upper (arms, wrists, hands, fingers): None, normal Lower (legs, knees, ankles, toes): None, normal, Trunk Movements Neck, shoulders, hips: None, normal, Overall Severity Severity of abnormal movements (highest score from questions above): None, normal Incapacitation due to abnormal movements: None, normal Patient's awareness of abnormal movements (rate only patient's report): No Awareness, Dental Status Current problems with teeth and/or dentures?: No Does patient usually wear dentures?: No  CIWA:  CIWA-Ar Total: 1 COWS:  COWS Total Score: 2  Musculoskeletal: Strength & Muscle Tone: within normal limits Gait & Station: normal Patient leans: N/A  Psychiatric Specialty Exam: Physical Exam  Nursing note and vitals reviewed.    Review of Systems  Psychiatric/Behavioral: The patient is nervous/anxious.   All other systems reviewed and are negative.   Blood pressure (!) 93/49, pulse 82, temperature 98.3 F (36.8 C), temperature source Oral, resp. rate 16, height 5\' 9"  (1.753 m), weight 59 kg (130 lb).Body mass index is 19.2 kg/m.  General Appearance: Guarded  Eye Contact:  Fair  Speech:  Normal Rate  Volume:  Decreased  Mood:observed as   Anxious   Affect:  Congruent  Thought Process:  Goal Directed and Descriptions of Associations: Circumstantial  Orientation:  Other:  to person, situation  Thought Content: Paranoia - on and off -  improving  Suicidal Thoughts: denies  Homicidal Thoughts:  No  Memory:  Immediate; fair Recent; fair  Remote;  fair  Judgement:  Fair  Insight:  Fair  Psychomotor Activity:  Normal  Concentration:  Concentration: Fair and Attention Span: Fair  Recall:  FiservFair  Fund of Knowledge:  Fair  Language:  Fair  Akathisia:  No  Handed:  Right  AIMS (if indicated):     Assets:  Communication Skills Social Support  ADL's:  Intact  Cognition:  WNL  Sleep:  Number of Hours: 6.5   07/02/16 [facility]-- I have reviewed it- [facility]/02/[facility]-01/29/16. Pt as per records was noncompliant with out patient follow ups or medications zyprexa, lexapro - was discharged from prison in November 2016. Pt also was abusing cocaine, cannabis . He presented with psychosis , mood lability - responded well to invega - was given invega sustenna IM. He was also found to have Vitamin b12 deficiency - was started Vitamin b12 replacement. Pt was discharged to his guardian who found placement. His family hx is unknown , he was adopted as a child, went up to 9 th grade.   07/09/16 Writer reviewed [facility],  Patient admitted there several times - most recently 07/29/15- 10/11/15- he walked in with  c/o psychosis.  That was patient's 17 admission there .  The previous admission was from 04/2012 to may 2015 - for incapable to proceed status on charges of breaking and entering.During his stay at that time he stayed for sometime in solitary confinement. During his recent admission there - he was started on Zyprexa and lexapro - discharged on the same - zyprexa 15 mg and lexapro 10 mg. Pt 's discharge diagnosis was schizophrenia multiple episodes , cannabis use do, stimulant use do , tobacco use do, TBI in 2002, vitamin D deficiency and impaired visual acuity.   Treatment Plan Summary: Patient today continues to be calm and cooperative.  Pt continues to be on forced nonemergent medications - renewed 07/08/16 - valid for 7 days. [facility] /DC with legal guardian if he continues to be stable.  Paranoid schizophrenia (HCC) unstable - improving some  Will continue today 07/14/16  plan as below except where it is noted.  Daily contact with patient to assess and evaluate symptoms and progress in treatment and Medication management For psychosis: Invega sustenna IM 234 mg x 1 dose - 07/09/16, Invega sustenna IM 156 mg IM - next dose - 07/15/16, then repeat 156 mg IM q 28 days.  For mood lability: Continue Lamictal 50 mg po daily and 100  mg po qpm. Will continue  Ativan 0.5 mg po bid.  For insomina: Will continue Trazodone 150 mg po qhs for sleep.  For agitation/anxiety: Continue PRN medications as per agitation protocol.  For Vitamin b12 deficiency: Continue Vitamin b12 1000 mcg daily po.  Will continue to monitor vitals ,medication compliance and treatment side effects while patient is here.   Will monitor for medical issues as well as call consult as needed.   Reviewed labs - pt refused-  TSH, Lipid panel, hba1c, pl, vitamin b12- Was re ordered - pt refused again .  Pt refused EKG for qtc monitoring again .  PPD offered- 06/26/16 , reordered again  07/10/16- pt refused.  CSW will continue  working on disposition.Patient  [facility].If improves -  can be discharged with legal guardian.  Patient to participate in therapeutic milieu. No changes made on the current plan of care. Continue treatment plan.  Sanjuana KavaNwoko, Agnes I, NP, PMHNP, FNP-BC. 07/14/2016, 2:29 PM   I agree to the findings and plan

## 2016-07-14 NOTE — [facility] Group Notes (Signed)
[facility] Group Notes: (Clinical Social Work)   07/14/2016      Type of Therapy:  Group Therapy   Participation Level:  Did Not Attend despite MHT prompting   Brittini Brubeck Grossman-Orr, LCSW 07/14/2016, 11:36 AM     

## 2016-07-15 MED ORDER — LORAZEPAM 2 MG/ML IJ SOLN
2.0000 mg | Freq: Once | INTRAMUSCULAR | Status: AC
  Administered 2016-07-15: 2 mg via INTRAMUSCULAR

## 2016-07-15 MED ORDER — HALOPERIDOL LACTATE 5 MG/ML IJ SOLN
5.0000 mg | Freq: Once | INTRAMUSCULAR | Status: AC
  Administered 2016-07-15: 5 mg via INTRAMUSCULAR
  Filled 2016-07-15: qty 1

## 2016-07-15 MED ORDER — HALOPERIDOL LACTATE 5 MG/ML IJ SOLN
INTRAMUSCULAR | Status: AC
  Administered 2016-07-15: 5 mg
  Filled 2016-07-15: qty 1

## 2016-07-15 NOTE — [facility] Group Notes (Signed)
[facility] LCSW Group Therapy  07/15/2016 1:15 pm  Type of Therapy: Process Group Therapy  Participation Level:  Active  Participation Quality:  Appropriate  Affect:  Flat  Cognitive:  Oriented  Insight:  Improving  Engagement in Group:  Limited  Engagement in Therapy:  Limited  Modes of Intervention:  Activity, Clarification, Education, Problem-solving and Support  Summary of Progress/Problems: Today's group addressed the issue of overcoming obstacles.  Patients were asked to identify their biggest obstacle post d/c that stands in the way of their on-going success, and then problem solve as to how to manage this. Came to group willingly.  Sat listening for the first 20 minutes or so-was talking to self periodically throughout, but not instrusive.  David Davila asked about obstacles, he talked about addiction and how he is never going to smoke crack cocaine.  Once he got started, was not redirectable.  Talked bout people blowing smoke in his face and trying to get him to use, and how if that happened again he would kill them.  Finally cut him off and moved onto another group member. David Davila left soon after than and did not return.  David Davila, David Davila 07/15/2016   3:24 PM

## 2016-07-15 NOTE — Progress Notes (Signed)
Patient ID: David Daviesaul Teem Jr., male   DOB: 12/25/1980, 35 y.o.   MRN: [id number] PER STATE REGULATIONS 482.30  THIS CHART WAS REVIEWED FOR MEDICAL NECESSITY WITH RESPECT TO THE PATIENT'S ADMISSION/ DURATION OF STAY.  NEXT REVIEW DATE: 07/18/2016  David RoughJENNIFER JONES [name], RN, BSN CASE MANAGER

## 2016-07-15 NOTE — Tx Team (Signed)
Interdisciplinary Treatment and Diagnostic Plan Update  07/15/2016 Time of Session: 8:30 AM  David Davila. MRN: [id number]  Principal Diagnosis: Paranoid schizophrenia (HCC)  Secondary Diagnoses: Principal Problem:   Paranoid schizophrenia (HCC) Active Problems:   Noncompliance with medication regimen   Cocaine use disorder, mild, abuse   Cannabis use disorder, mild, abuse   Vitamin B12 deficiency   Current Medications:  Current Facility-Administered Medications  Medication Dose Route Frequency Provider Last Rate Last Dose  . acetaminophen (TYLENOL) tablet 650 mg  650 mg Oral Q6H PRN Charm Rings, NP      . alum & mag hydroxide-simeth (MAALOX/MYLANTA) 200-200-20 MG/5ML suspension 30 mL  30 mL Oral Q4H PRN Charm Rings, NP      . atorvastatin (LIPITOR) tablet 10 mg  10 mg Oral q1800 Jomarie Longs, MD   10 mg at 07/14/16 1659  . cyanocobalamin ((VITAMIN B-12)) injection 1,000 mcg  1,000 mcg Intramuscular Once Jomarie Longs, MD      . hydrOXYzine (ATARAX/VISTARIL) tablet 25 mg  25 mg Oral TID Charm Rings, NP   25 mg at 07/15/16 0800  . lamoTRIgine (LAMICTAL) tablet 100 mg  100 mg Oral QPM Saramma Eappen, MD   100 mg at 07/14/16 1659  . lamoTRIgine (LAMICTAL) tablet 50 mg  50 mg Oral Daily Saramma Eappen, MD   50 mg at 07/15/16 0800  . LORazepam (ATIVAN) tablet 1 mg  1 mg Oral Q6H PRN Jomarie Longs, MD   1 mg at 07/13/16 2219   Or  . LORazepam (ATIVAN) injection 1 mg  1 mg Intramuscular Q6H PRN Jomarie Longs, MD      . LORazepam (ATIVAN) tablet 0.5 mg  0.5 mg Oral BID Jomarie Longs, MD   0.5 mg at 07/15/16 0800  . magnesium hydroxide (MILK OF MAGNESIA) suspension 30 mL  30 mL Oral Daily PRN Charm Rings, NP      . OLANZapine zydis (ZYPREXA) disintegrating tablet 10 mg  10 mg Oral BID PRN Jackelyn Poling, NP   10 mg at 07/13/16 2218   Or  . OLANZapine (ZYPREXA) injection 10 mg  10 mg Intramuscular BID PRN Jackelyn Poling, NP   10 mg at 07/08/16 0404  . [START ON 08/13/2016]  paliperidone (INVEGA SUSTENNA) injection 156 mg  156 mg Intramuscular Q28 days Jomarie Longs, MD      . traZODone (DESYREL) tablet 150 mg  150 mg Oral QHS Jomarie Longs, MD   150 mg at 07/13/16 2218  . tuberculin injection 5 Units  5 Units Intradermal Once Jomarie Longs, MD        PTA Medications: Prescriptions Prior to Admission  Medication Sig Dispense Refill Last Dose  . atorvastatin (LIPITOR) 10 MG tablet Take 10 mg by mouth at bedtime.   03/09/2016 at Unknown time  . cyanocobalamin (,VITAMIN B-12,) 1000 MCG/ML injection Inject 1,000 mcg into the muscle every 30 (thirty) days.   Past Month at Unknown time  . lamoTRIgine (LAMICTAL) 100 MG tablet Take 100 mg by mouth 2 (two) times daily.   03/09/2016 at Unknown time  . LORazepam (ATIVAN) 0.5 MG tablet Take 0.5 mg by mouth every 6 (six) hours as needed for anxiety.   03/09/2016 at Unknown time    Treatment Modalities: Medication Management, Group therapy, Case management,  1 to 1 session with clinician, Psychoeducation, Recreational therapy.   Physician Treatment Plan for Primary Diagnosis: Paranoid schizophrenia (HCC) Long Term Goal(s): Improvement in symptoms so as ready for discharge  Short  Term Goals: Compliance with prescribed medications will improve  Medication Management: Evaluate patient's response, side effects, and tolerance of medication regimen.  Therapeutic Interventions: 1 to 1 sessions, Unit Group sessions and Medication administration.  Evaluation of Outcomes: Progressing  11/20::Patient today seen as  delusional ,disorganized , and labile.  Will continue to readjust medications. Will cross taper Haldol with Invega for psychosis . Haldol likely not effective . Will increase Invega to 6 mg po qhs. Will continue Cogentin 0.5 mg po bid for EPS.  For mood lability: Increased Lamictal to 25 mg po BID.  11/24: :patient today continues to be not cooperative when Clinical research associatewriter attempted to evaluate Scientific laboratory technician- writer made several  attempts today .  Paranoid schizophrenia (HCC) unstable  For psychosis: Will continue Invega  9 mg po/  Zyprexa 10 mg IM daily . Will continue Cogentin 0.5 mg po/IM bid for EPS.  For mood lability: Will continue  Lamictal to 50 mg po BID.Pt has been refusing it . Will continue  Ativan 0.5 mg po bid.  11/29:  For psychosis: Will discontinue Invega PO - since he is on Invega sustenna IM. Invega sustenna IM 234 mg x 1 dose - 07/09/16, Invega sustenna IM 156 mg IM - next dose - 07/15/16 , then repeat 156 mg IM q 28 days.  12/4: Presentation remains much the same Meds remain the same-will get second dose of Invega sustenna today   Physician Treatment Plan for Secondary Diagnosis: Principal Problem:   Paranoid schizophrenia (HCC) Active Problems:   Noncompliance with medication regimen   Cocaine use disorder, mild, abuse   Cannabis use disorder, mild, abuse   Vitamin B12 deficiency   Long Term Goal(s): Improvement in symptoms so as ready for discharge  Short Term Goals: Ability to verbalize feelings will improve  Medication Management: Evaluate patient's response, side effects, and tolerance of medication regimen.  Therapeutic Interventions: 1 to 1 sessions, Unit Group sessions and Medication administration.  Evaluation of Outcomes: Adequate for Discharge   RN Treatment Plan for Primary Diagnosis: Paranoid schizophrenia (HCC) Long Term Goal(s): Knowledge of disease and therapeutic regimen to maintain health will improve  Short Term Goals: Ability to identify and develop effective coping behaviors will improve and Compliance with prescribed medications will improve  Medication Management: RN will administer medications as ordered by provider, will assess and evaluate patient's response and provide education to patient for prescribed medication. RN will report any adverse and/or side effects to prescribing provider.  Therapeutic Interventions: 1 on 1 counseling sessions,  Psychoeducation, Medication administration, Evaluate responses to treatment, Monitor vital signs and CBGs as ordered, Perform/monitor CIWA, COWS, AIMS and Fall Risk screenings as ordered, Perform wound care treatments as ordered.  Evaluation of Outcomes: Adequate for Discharge   LCSW Treatment Plan for Primary Diagnosis: Paranoid schizophrenia (HCC) Long Term Goal(s): Safe transition to appropriate next level of care at discharge, Engage patient in therapeutic group addressing interpersonal concerns.  Short Term Goals: Engage patient in aftercare planning with referrals and resources  Therapeutic Interventions: Assess for all discharge needs, 1 to 1 time with Social worker, Explore available resources and support systems, Assess for adequacy in community support network, Educate family and significant other(s) on suicide prevention, Complete Psychosocial Assessment, Interpersonal group therapy.  Evaluation of Outcomes: Progressing  Have spoken with guardian, Zebedee IbaRon Johnson, [phone] who is attempting to find placement for patient.  In the meantime, [facility]. 11/20:  [facility].  [facility] has  come to do evaluation.  Pt refused PPD-will need to get chest xray.  Guardian sent back signed releases today so we can try to access records at previous hospitalizations 11/24:  Guardian visited on Wed.  Stated that patient is not at baseline-was talking to guardian about non-existent people and situations that did not occur. 11/29:  Guardian came to visit today.  Told pt he thought he needed about another week.  CSW told guardian pt would be d/ced no later than Wed of next week-1 week.  Guardian stated he alone is not responsible for pt placement and asked CSW for help.  CSW to contact potential GH, while reminding guardian that he is ultimately responsible for transporation of pt away from facility. 12/4:  CSW began with search in [place] and [place] last week,  expanded to [place] on Friday as no takers.  Will continue to look for placement.  Sent guardian a letter stating we would plan on patient pick up at 1:00 on Wednesday   Progress in Treatment: Attending groups: No Participating in groups:No Taking medication as prescribed: Yes Toleration medication: Yes, no side effects reported at this time Family/Significant other contact made: Yes  Guardian Patient understands diagnosis: No  Limited insight Discussing patient identified problems/goals with staff: Yes Medical problems stabilized or resolved: Yes Denies suicidal/homicidal ideation: Yes Issues/concerns per patient self-inventory: None Other: N/A  New problem(s) identified: None identified at this time.   New Short Term/Long Term Goal(s): None identified at this time.   Discharge Plan or Barriers:   Reason for Continuation of Hospitalization:  Medication stabilization   Estimated Length of Stay: 3-5 days  Attendees: Patient: 07/15/2016  8:30 AM  Physician: Jomarie LongsSaramma Eappen, MD 07/15/2016  8:30 AM  Nursing: Esperanza SheetsElizabeth Iwenekha RN 07/15/2016  8:30 AM  RN Care Manager: Onnie BoerJennifer Clark, RN 07/15/2016  8:30 AM  Social Worker: Richelle Itood Aleshka Corney 07/15/2016  8:30 AM  Recreational Therapist: Aggie CosierMarjette Lindsey 07/15/2016  8:30 AM  Other: Tomasita Morrowelora Sutton 07/15/2016  8:30 AM  Other:  07/15/2016  8:30 AM    Scribe for Treatment Team:  Daryel Geraldodney Lynnett Langlinais LCSW 07/15/2016 8:30 AM

## 2016-07-15 NOTE — Progress Notes (Signed)
Recreation Therapy Notes  Date: 07/15/16 Time: 1000 Location: [address] Dayroom  Group Topic: Coping Skills  Goal Area(s) Addresses:  Patients will be able to identify positive coping skills. Patients will be able to identify the benefits of coping skills. Patients will be able to identify how using positive coping skill can help them post d/c.  Intervention: Financial traderWeb worksheet, pencils  Activity: OrthoptistWeb Design.  Patients were to identify the situations that have brought them to the hospital and place them inside the spider web.  Patients were to then identify coping skills that can help them deal with those situations.  Education: PharmacologistCoping Skills, Building control surveyorDischarge Planning.   Education Outcome: Acknowledges understanding/In group clarification offered/Needs additional education.   Clinical Observations/Feedback:  Pt did not attend group.   Caroll RancherMarjette Kirah Davila, LRT/CTRS     Caroll RancherLindsay, [name] 07/15/2016 12:34 PM

## 2016-07-15 NOTE — Progress Notes (Signed)
[facility]  07/15/2016 3:39 PM David DaviesPaul Biscoe Jr.  MRN:  [id number]  Subjective:  Patient witnessed unprovoked, started to shout, and pace the hallway back and forth.  Raising arms, verbalizing that he was going to kill someone, hurt someone, using profane language.  Objective:  David Davilais a 35 y.o.AA male, who is single, on SSD , who has a hx of schizophrenia and noncompliance with medications , [facility] [place], [facility].  Patient seen and chart reviewed. Discussed patient with the treatment team.  Presents very psychotic. He is pacing up & the hall-ways.  Labile.  Blunt, Inappropriate.  Though blocking evident Pt tolerated first dose of Invega sustenna IM - next dose Monday. Will continue to treatment. CSW coordinating [facility] - referrals made .   Principal Problem: Paranoid schizophrenia (HCC) Diagnosis:   Patient Active Problem List   Diagnosis Date Noted  . TBI (traumatic brain injury) (HCC) [S06.9X9A] 07/10/2016  . Vitamin B12 deficiency [E53.8] 07/10/2016  . Noncompliance with medication regimen [Z91.14] 06/25/2016  . Cocaine use disorder, mild, abuse [F14.10] 06/25/2016  . Cannabis use disorder, mild, abuse [F12.10] 06/25/2016  . Paranoid schizophrenia (HCC) [F20.0] 06/24/2016   Total Time spent with patient: 15 minutes  Past Psychiatric History: Please see H&P.   Past Medical History:  Past Medical History:  Diagnosis Date  . Acute psychosis   . Cannabis abuse    Past  . Paranoid schizophrenia (HCC)     Family History: Please see H&P.  Family Psychiatric  History: unknown - pt unable to discuss, guardian unaware.  Social History: pt has legal guardian - [facility] [place]. History  Alcohol Use No     History  Drug Use  . Types: Cocaine, Marijuana    Social History   Social History  . Marital status: Single    Spouse name: N/A  .  Number of children: N/A  . Years of education: N/A   Social History Main Topics  . Smoking status: Current Every Day Smoker    Packs/day: 1.00    Types: Cigarettes  . Smokeless tobacco: Never Used  . Alcohol use No  . Drug use:     Types: Cocaine, Marijuana  . Sexual activity: Not Asked   Other Topics Concern  . None   Social History Narrative  . None   Additional Social History:   Sleep: Fair  Appetite: fair  Current Medications: Current Facility-Administered Medications  Medication Dose Route Frequency Provider Last Rate Last Dose  . haloperidol lactate (HALDOL) 5 MG/ML injection           . acetaminophen (TYLENOL) tablet 650 mg  650 mg Oral Q6H PRN Charm RingsJamison Y Lord, NP      . alum & mag hydroxide-simeth (MAALOX/MYLANTA) 200-200-20 MG/5ML suspension 30 mL  30 mL Oral Q4H PRN Charm RingsJamison Y Lord, NP      . atorvastatin (LIPITOR) tablet 10 mg  10 mg Oral q1800 Jomarie LongsSaramma Eappen, MD   10 mg at 07/14/16 1659  . cyanocobalamin ((VITAMIN B-12)) injection 1,000 mcg  1,000 mcg Intramuscular Once Saramma Eappen, MD      . haloperidol lactate (HALDOL) injection 5 mg  5 mg Intramuscular Once Craige CottaFernando A Vonn Sliger, MD      . hydrOXYzine (ATARAX/VISTARIL) tablet 25 mg  25 mg Oral TID Charm RingsJamison Y Lord, NP   25 mg at 07/15/16 0800  .  lamoTRIgine (LAMICTAL) tablet 100 mg  100 mg Oral QPM Saramma Eappen, MD   100 mg at 07/14/16 1659  . lamoTRIgine (LAMICTAL) tablet 50 mg  50 mg Oral Daily Saramma Eappen, MD   50 mg at 07/15/16 0800  . LORazepam (ATIVAN) tablet 1 mg  1 mg Oral Q6H PRN Jomarie LongsSaramma Eappen, MD   1 mg at 07/13/16 2219   Or  . LORazepam (ATIVAN) injection 1 mg  1 mg Intramuscular Q6H PRN Jomarie LongsSaramma Eappen, MD      . LORazepam (ATIVAN) injection 2 mg  2 mg Intramuscular Once Rockey SituFernando A Thomos Domine, MD      . LORazepam (ATIVAN) tablet 0.5 mg  0.5 mg Oral BID Jomarie LongsSaramma Eappen, MD   0.5 mg at 07/15/16 0800  . magnesium hydroxide (MILK OF MAGNESIA) suspension 30 mL  30 mL Oral Daily PRN Charm RingsJamison Y Lord, NP      .  OLANZapine zydis (ZYPREXA) disintegrating tablet 10 mg  10 mg Oral BID PRN Jackelyn PolingJason A Berry, NP   10 mg at 07/13/16 2218   Or  . OLANZapine (ZYPREXA) injection 10 mg  10 mg Intramuscular BID PRN Jackelyn PolingJason A Berry, NP   10 mg at 07/08/16 0404  . [START ON 08/13/2016] paliperidone (INVEGA SUSTENNA) injection 156 mg  156 mg Intramuscular Q28 days Jomarie LongsSaramma Eappen, MD      . traZODone (DESYREL) tablet 150 mg  150 mg Oral QHS Jomarie LongsSaramma Eappen, MD   150 mg at 07/13/16 2218  . tuberculin injection 5 Units  5 Units Intradermal Once Jomarie LongsSaramma Eappen, MD        Lab Results: No results found for this or any previous visit (from the past 48 hour(s)).  Blood Alcohol level:  Lab Results  Component Value Date   ETH <5 06/22/2016   ETH <5 03/10/2016   Metabolic Disorder Labs: No results found for: HGBA1C, MPG No results found for: PROLACTIN No results found for: CHOL, TRIG, HDL, CHOLHDL, VLDL, LDLCALC  Physical Findings: AIMS: Facial and Oral Movements Muscles of Facial Expression: None, normal Lips and Perioral Area: None, normal Jaw: None, normal Tongue: None, normal,Extremity Movements Upper (arms, wrists, hands, fingers): None, normal Lower (legs, knees, ankles, toes): None, normal, Trunk Movements Neck, shoulders, hips: None, normal, Overall Severity Severity of abnormal movements (highest score from questions above): None, normal Incapacitation due to abnormal movements: None, normal Patient's awareness of abnormal movements (rate only patient's report): No Awareness, Dental Status Current problems with teeth and/or dentures?: No Does patient usually wear dentures?: No  CIWA:  CIWA-Ar Total: 1 COWS:  COWS Total Score: 2  Musculoskeletal: Strength & Muscle Tone: within normal limits Gait & Station: normal Patient leans: N/A  Psychiatric Specialty Exam: Physical Exam  Nursing note and vitals reviewed.   Review of Systems  Psychiatric/Behavioral: The patient is nervous/anxious.   All other  systems reviewed and are negative.   Blood pressure 128/76, pulse 84, temperature 98 F (36.7 C), temperature source Oral, resp. rate 16, height 5\' 9"  (1.753 m), weight 59 kg (130 lb).Body mass index is 19.2 kg/m.  General Appearance: Guarded  Eye Contact:  Fair  Speech:  Normal Rate  Volume:  Decreased  Mood:observed as   Anxious   Affect:  Congruent  Thought Process:  Goal Directed and Descriptions of Associations: Circumstantial  Orientation:  Other:  to person, situation  Thought Content: Paranoia - on and off - improving  Suicidal Thoughts: denies  Homicidal Thoughts:  No  Memory:  Immediate; fair Recent; fair  Remote;  fair  Judgement:  Fair  Insight:  Fair  Psychomotor Activity:  Normal  Concentration:  Concentration: Fair and Attention Span: Fair  Recall:  [employer] of Knowledge:  Fair  Language:  Fair  Akathisia:  No  Handed:  Right  AIMS (if indicated):     Assets:  Communication Skills Social Support  ADL's:  Intact  Cognition:  WNL  Sleep:  Number of Hours: 5.75   07/02/16 Obtained medical records from [facility]-- I have reviewed it- patient was admitted at [facility] center 01/02/16-01/29/16. Pt as per records was noncompliant with out patient follow ups or medications zyprexa, lexapro - was [facility] from prison in November 2016. Pt also was abusing cocaine, cannabis . He presented with psychosis , mood lability - responded well to invega - was given invega sustenna IM. He was also found to have Vitamin b12 deficiency - was started Vitamin b12 replacement. Pt was discharged to his guardian who found placement. His family hx is unknown , he was adopted as a child, went up to 9 th grade.   07/09/16 Writer reviewed Medical records from [facility] [place],  Patient admitted there several times - most recently 07/29/15- 10/11/15- he walked in with c/o psychosis.  That was patient's 17 admission there .  The previous admission was from  04/2012 to may 2015 - for incapable to proceed status on charges of breaking and entering.During his stay at that time he stayed for sometime in solitary confinement. During his recent admission there - he was started on Zyprexa and lexapro - discharged on the same - zyprexa 15 mg and lexapro 10 mg. Pt 's discharge diagnosis was schizophrenia multiple episodes , cannabis use do, stimulant use do , tobacco use do, TBI in 2002, vitamin D deficiency and impaired visual acuity.   Treatment Plan Summary: Patient today continues to be calm and cooperative. Pt continues to be on forced nonemergent medications - renewed 07/08/16 - valid for 7 days. Pt to be referred for [facility] placement /DC with legal guardian if he continues to be stable.  Paranoid schizophrenia (HCC) unstable - improving some  Will continue today 07/15/16  plan as below except where it is noted.  Daily contact with patient to assess and evaluate symptoms and progress in treatment and Medication management For psychosis: Invega sustenna IM 234 mg x 1 dose - 07/09/16, Invega sustenna IM 156 mg IM - next dose - 07/15/16, then repeat 156 mg IM q 28 days.  For mood lability: Continue Lamictal 50 mg po daily and 100  mg po qpm. Will continue  Ativan 0.5 mg po bid.  For insomina: Will continue Trazodone 150 mg po qhs for sleep.  For agitation/anxiety: Continue PRN medications as per agitation protocol.  For Vitamin b12 deficiency: Continue Vitamin b12 1000 mcg daily po.  Will continue to monitor vitals ,medication compliance and treatment side effects while patient is here.   Will monitor for medical issues as well as call consult as needed.   Reviewed labs - pt refused-  TSH, Lipid panel, hba1c, pl, vitamin b12- Was re ordered - pt refused again .  Pt refused EKG for qtc monitoring again .  PPD offered- 06/26/16 , reordered again 07/10/16- pt refused.  CSW will continue  working on disposition.Patient  referred to [facility]  for higher level of care.If improves - can be discharged with legal guardian.  Patient to participate in therapeutic milieu. No changes made on the  current plan of care. Continue treatment plan.  Velna Hatchet May Agustin, NP-BC. 07/15/2016, 3:39 PM   Agree with NP Progress Note, as above

## 2016-07-15 NOTE — Progress Notes (Signed)
DAR NOTE: Patient presently calm and appropriate to situation.  Patient is pacing on the unit.  Patient went to the Cafeteria for supper without any issues.  Medication given as prescribed.  No adverse reaction noted from IM Paliperidone given. Maintained on routine safety checks. Support and encouragement offered as needed.  Attended group and participated.

## 2016-07-15 NOTE — Progress Notes (Signed)
D: At approximately 1445 pt became agitated unprovoked in milieu. Noted with increased pacing in milieu, went closer to double doors as interpreter was entering unit as if he wanted to go out. Became louder as staff attempted verbal redirection "Fuck you, fuck your mama and your whole damn family, call all your people, where your team at with mutltiple hand gestures, go get your whole fucking team". A: Verbal redirections attempted by staff but with no effect. New order received for Ativan 2 mg IM X1 and Zyprexa 5 mg IM X1. Medications administered as prescribed IM without issues. Emotional support and availability provided to pt.  Q 15 minutes safety checks maintained. R: Pt is verbally redirectable at this time. Decrease pacing noted. Will continue to monitor pt for safety.

## 2016-07-16 MED ORDER — TRAZODONE HCL 100 MG PO TABS
100.0000 mg | ORAL_TABLET | Freq: Every day | ORAL | Status: DC
  Administered 2016-07-16: 100 mg via ORAL
  Filled 2016-07-16 (×3): qty 1

## 2016-07-16 MED ORDER — OLANZAPINE 5 MG PO TBDP
5.0000 mg | ORAL_TABLET | Freq: Every day | ORAL | Status: DC
  Administered 2016-07-16: 5 mg via ORAL
  Filled 2016-07-16 (×3): qty 1

## 2016-07-16 NOTE — Plan of Care (Signed)
Problem: Safety: Goal: Periods of time without injury will increase Outcome: Progressing Pt safe on the unit at this time   

## 2016-07-16 NOTE — Progress Notes (Signed)
Recreation Therapy Notes  Animal-Assisted Activity (AAA) Program Checklist/Progress Notes Patient Eligibility Criteria Checklist & Daily Group note for Rec Tx Intervention  Date: 12.05.2017 Time: 2:45pm [address]: [address]    AAA/T Program Assumption of Risk Form signed by Patient/ or Parent Legal Guardian Yes  Patient is free of allergies or sever asthma Yes  Patient reports no fear of animals Yes  Patient reports no history of cruelty to animals Yes  Patient understands his/her participation is voluntary Yes  Patient washes hands before animal contact Yes  Patient washes hands after animal contact Yes  Behavioral Response: Initially appropriate to Inappropriate    Education: Hand Washing, Appropriate Animal Interaction   Education Outcome: Acknowledges education.   Clinical Observations/Feedback: Patient discussed with MD for appropriateness in pet therapy session. Both LRT and MD agree patient is appropriate for participation. Patient offered participation in session and signed necessary consent form without issue. Patient initially appropriate in session, asking appropriate questions about therapy and his training. Patient shared stories about pets he has had in the past. As patient comfort level increased in session patient began using profanity and told a story about a dog getting run over. Patient laughed heartily when telling this story and imitated the sounds the dog made as he was getting run over. LRT attempted to redirect patient, however patient did not respond to LRT. LRT requested MHT assist with removing patient from group and escorting him back to locked 500 hall. Patient tolerated being removed from group.   Marykay Lexenise L Ozzie Knobel, LRT/CTRS        David Davila L 07/16/2016 3:10 PM

## 2016-07-16 NOTE — Progress Notes (Signed)
.  DAR NOTE: Patient presents flat affect and depressed mood.  Denies pain, auditory and visual hallucinations.  Rates depression at 0 hopelessness at 0, and anxiety at 0.  Maintained on routine safety checks.  Medications given as prescribed.  Support and encouragement offered as needed.  Attended group and participated. Patient observed socializing with peers in the dayroom.  Offered no complaint.

## 2016-07-16 NOTE — Progress Notes (Signed)
Did not attend group 

## 2016-07-16 NOTE — Progress Notes (Signed)
Recreation Therapy Notes  Date: 07/16/16 Time: 1000 Location: [address] Dayroom  Group Topic: Anger Management  Goal Area(s) Addresses:  Patient will identify triggers for anger.  Patient will identify physical reaction to anger.   Patient will identify benefit of using coping skills when angry.  Behavioral Response: Minimal  Intervention: Anger thermometer worksheet  Activity: Anger Thermometer.  Patients were given a worksheet with a thermometer to rank their experiences with anger "1" being the least and "10" being the most angry.  Patients were to give a description of the incident, how they reacted, how they felt and the consequences of their actions.    Education: Anger Management, Discharge Planning   Education Outcome: Acknowledges education/In group clarification offered/Needs additional education.   Clinical Observations/Feedback: Pt was minimal in his participation.  Pt was mostly quiet and observed.  Pt left early and did not return.   Caroll RancherMarjette Marlon Suleiman, LRT/CTRS        Caroll RancherLindsay, Nishanth Mccaughan A 07/16/2016 12:13 PM

## 2016-07-16 NOTE — [facility] Group Notes (Signed)
[facility] LCSW Group Therapy  07/16/2016 , 3:38 PM   Type of Therapy:  Group Therapy  Participation Level:  Active  Participation Quality:  Attentive  Affect:  Appropriate  Cognitive:  Alert  Insight:  Improving  Engagement in Therapy:  Engaged  Modes of Intervention:  Discussion, Exploration and Socialization  Summary of Progress/Problems: Today's group focused on the term Diagnosis.  Participants were asked to define the term, and then pronounce whether it is a negative, positive or neutral term.  Stayed the entire time, engaged throughout.  Was not disruptive, was not RIS, mood was neutral.  Talked about how a colony is a place where people have to depend on each other. Identifed that often people who are in the hospital have issues with their family that require "atonement or apology."  This is particularly the case when the family IVC'd the pt.    David Davila, David Davila 07/16/2016 , 3:38 PM

## 2016-07-16 NOTE — Progress Notes (Signed)
[facility]  07/16/2016 11:47 AM David DaviesPaul Hannay Jr.  MRN:  [id number] Subjective:  Pt today states " I am OK. Who is David Davila ? Do I know him. " Pt then observed as smiling inappropriately and waving looking towards the corner of his room, saying " I am saying bye to all my friends."      Objective:  David Bihariaul Darley Davilais a 35 y.o.AA male, who is single, on SSD , who has a hx of schizophrenia and noncompliance with medications , [facility] [place] , [facility] [employer] for disorganized behavior .  Patient seen and chart reviewed.Discussed patient with treatment team.  Pt today seen as calm, but laughs inappropriately at times . Pt also per report from staff was aggressive and agitated on the unit yesterday requiring PRN Medications to calm him down. Pt is tolerating Invega sustenna IM - 2 nd dose received on Monday - 07/15/16. Will add Zyprexa zydis - toaugment the effect of Invega . Will continue to encourage and support.  Principal Problem: Paranoid schizophrenia (HCC) Diagnosis:   Patient Active Problem List   Diagnosis Date Noted  . TBI (traumatic brain injury) (HCC) [S06.9X9A] 07/10/2016  . Vitamin B12 deficiency [E53.8] 07/10/2016  . Noncompliance with medication regimen [Z91.14] 06/25/2016  . Cocaine use disorder, mild, abuse [F14.10] 06/25/2016  . Cannabis use disorder, mild, abuse [F12.10] 06/25/2016  . Paranoid schizophrenia (HCC) [F20.0] 06/24/2016   Total Time spent with patient: 25 minutes  Past Psychiatric History: Please see H&P.   Past Medical History:  Past Medical History:  Diagnosis Date  . Acute psychosis   . Cannabis abuse    Past  . Paranoid schizophrenia (HCC)     Family History:Please see H&P.  Family Psychiatric  History: unknown - pt unable to discuss , guardian unaware. Social History: pt has legal guardian - [facility]. History  Alcohol Use No     History  Drug Use  . Types: Cocaine,  Marijuana    Social History   Social History  . Marital status: Single    Spouse name: N/A  . Number of children: N/A  . Years of education: N/A   Social History Main Topics  . Smoking status: Current Every Day Smoker    Packs/day: 1.00    Types: Cigarettes  . Smokeless tobacco: Never Used  . Alcohol use No  . Drug use:     Types: Cocaine, Marijuana  . Sexual activity: Not Asked   Other Topics Concern  . None   Social History Narrative  . None   Additional Social History:                         Sleep: fair  Appetite: fair  Current Medications: Current Facility-Administered Medications  Medication Dose Route Frequency Provider Last Rate Last Dose  . acetaminophen (TYLENOL) tablet 650 mg  650 mg Oral Q6H PRN Charm RingsJamison Y Lord, NP      . alum & mag hydroxide-simeth (MAALOX/MYLANTA) 200-200-20 MG/5ML suspension 30 mL  30 mL Oral Q4H PRN Charm RingsJamison Y Lord, NP      . atorvastatin (LIPITOR) tablet 10 mg  10 mg Oral q1800 Jomarie LongsSaramma Jurgen Groeneveld, MD   10 mg at 07/15/16 1706  . cyanocobalamin ((VITAMIN B-12)) injection 1,000 mcg  1,000 mcg Intramuscular Once Jomarie LongsSaramma Addelyn Alleman, MD      . hydrOXYzine (ATARAX/VISTARIL) tablet 25 mg  25 mg Oral TID Charm Rings, NP   25 mg at 07/16/16 0809  . lamoTRIgine (LAMICTAL) tablet 100 mg  100 mg Oral QPM Redell Bhandari, MD   100 mg at 07/15/16 1705  . lamoTRIgine (LAMICTAL) tablet 50 mg  50 mg Oral Daily Jomarie Longs, MD   50 mg at 07/16/16 0809  . LORazepam (ATIVAN) tablet 1 mg  1 mg Oral Q6H PRN Jomarie Longs, MD   1 mg at 07/13/16 2219   Or  . LORazepam (ATIVAN) injection 1 mg  1 mg Intramuscular Q6H PRN Jomarie Longs, MD      . LORazepam (ATIVAN) tablet 0.5 mg  0.5 mg Oral BID Jomarie Longs, MD   0.5 mg at 07/16/16 0809  . magnesium hydroxide (MILK OF MAGNESIA) suspension 30 mL  30 mL Oral Daily PRN Charm Rings, NP      . OLANZapine zydis (ZYPREXA) disintegrating tablet 10 mg  10 mg Oral BID PRN Jackelyn Poling, NP   10 mg at 07/13/16  2218   Or  . OLANZapine (ZYPREXA) injection 10 mg  10 mg Intramuscular BID PRN Jackelyn Poling, NP   10 mg at 07/08/16 0404  . OLANZapine zydis (ZYPREXA) disintegrating tablet 5 mg  5 mg Oral QHS Kortland Nichols, MD      . [name] ON 08/13/2016] paliperidone (INVEGA SUSTENNA) injection 156 mg  156 mg Intramuscular Q28 days Jomarie Longs, MD      . traZODone (DESYREL) tablet 100 mg  100 mg Oral QHS Bryler Dibble, MD      . tuberculin injection 5 Units  5 Units Intradermal Once Jomarie Longs, MD        Lab Results: No results found for this or any previous visit (from the past 48 hour(s)).  Blood Alcohol level:  Lab Results  Component Value Date   ETH <5 06/22/2016   ETH <5 03/10/2016    Metabolic Disorder Labs: No results found for: HGBA1C, MPG No results found for: PROLACTIN No results found for: CHOL, TRIG, HDL, CHOLHDL, VLDL, LDLCALC  Physical Findings: AIMS: Facial and Oral Movements Muscles of Facial Expression: None, normal Lips and Perioral Area: None, normal Jaw: None, normal Tongue: None, normal,Extremity Movements Upper (arms, wrists, hands, fingers): None, normal Lower (legs, knees, ankles, toes): None, normal, Trunk Movements Neck, shoulders, hips: None, normal, Overall Severity Severity of abnormal movements (highest score from questions above): None, normal Incapacitation due to abnormal movements: None, normal Patient's awareness of abnormal movements (rate only patient's report): No Awareness, Dental Status Current problems with teeth and/or dentures?: No Does patient usually wear dentures?: No  CIWA:  CIWA-Ar Total: 1 COWS:  COWS Total Score: 2  Musculoskeletal: Strength & Muscle Tone: within normal limits Gait & Station: normal Patient leans: N/A  Psychiatric Specialty Exam: Physical Exam  Nursing note and vitals reviewed.   Review of Systems  Psychiatric/Behavioral: Positive for hallucinations. The patient is nervous/anxious.   All other systems  reviewed and are negative.   Blood pressure 116/69, pulse 95, temperature 97.5 F (36.4 C), temperature source Oral, resp. rate 18, height 5\' 9"  (1.753 m), weight 59 kg (130 lb).Body mass index is 19.2 kg/m.  General Appearance: Guarded  Eye Contact:  Fair  Speech:  Normal Rate  Volume:  Decreased  Mood:observed as   Anxious   Affect:  Inappropriate smiles to self at times  Thought Process:  Goal Directed and Descriptions of Associations: Circumstantial  Orientation:  Other:  to person, situation  Thought  Content:paranoia - on and off - improving  Suicidal Thoughts: denies  Homicidal Thoughts:  No  Memory:  Immediate; fair Recent; fair Remote;  fair  Judgement:  Fair  Insight:  Fair  Psychomotor Activity:  Normal  Concentration:  Concentration: Fair and Attention Span: Fair  Recall:  [employer]Fair  Fund of Knowledge:  Fair  Language:  Fair  Akathisia:  No  Handed:  Right  AIMS (if indicated):     Assets:  Communication Skills Social Support  ADL's:  Intact  Cognition:  WNL  Sleep:  Number of Hours: 4.75   07/02/16 [facility]-- I have reviewed it- [facility]-01/29/16. Pt as per records was noncompliant with out patient follow ups or medications zyprexa, lexapro - was [facility] from prison in November 2016. Pt also was abusing cocaine, cannabis . He presented with psychosis , mood lability - responded well to invega - was given invega sustenna IM. He was also found to have Vitamin b12 deficiency - was started Vitamin b12 replacement. Pt was discharged to his guardian who found placement. His family hx is unknown , he was adopted as a child, went up to 9 th grade.    07/09/16 Writer [facility]  Patient admitted there several times - most recently 07/29/15- 10/11/15- he walked in with c/o psychosis.  That was patient's 17 admission there .  The previous  admission was from 04/2012 to may 2015 - for incapable to proceed status on charges of breaking and entering.During his stay at that time he stayed for sometime in solitary confinement. During his recent admission there - he was started on Zyprexa and lexapro - discharged on the same - zyprexa 15 mg and lexapro 10 mg. Pt 's discharge diagnosis was schizophrenia multiple episodes , cannabis use do, stimulant use do , tobacco use do, TBI in 2002, vitamin D deficiency and impaired visual acuity.          Treatment Plan Summary: Patient today seen as laughing inappropriately - will start zyprexa zydis to augment the effect of Invega sustenna.  continues to be calm and cooperative. [facility]  [facility] with legal guardian once he is stable.  Paranoid schizophrenia (HCC) unstable - improving  Will continue today 07/16/16  plan as below except where it is noted.  Daily contact with patient to assess and evaluate symptoms and progress in treatment and Medication management For psychosis: Invega sustenna IM 234 mg x 1 dose - 07/09/16, Invega sustenna IM 156 mg IM - next dose - 07/15/16 , then repeat 156 mg IM q 28 days. Will add Zyprexa Zydis 5 mg po qhs .  For mood lability: Increased Lamictal  to 50 mg po daily and 100  mg po qpm. Will continue  Ativan 0.5 mg po bid.  For insomina: Will reduce Trazodone to 100 mg po qhs for sleep.  For agitation/anxiety: Continue PRN medications as per agitation protocol.  For Vitamin b12 deficiency: Continue Vitamin b12 1000 mcg daily po.  Will continue to monitor vitals ,medication compliance and treatment side effects while patient is here.   Will monitor for medical issues as well as call consult as needed.   Reviewed labs - pt refused-  TSH, Lipid panel, hba1c, pl, vitamin b12- Was re ordered - pt refused again .  Pt refused EKG for qtc monitoring again .  PPD offered- 06/26/16 , reordered again  07/10/16- pt  refused.  CSW will continue  working on disposition.Patient  referred to [facility] for higher level of care.If improves - can be discharged with legal guardian.  Patient to participate in therapeutic milieu.  Dymon Summerhill, MD  07/16/2016, 11:47 AM

## 2016-07-16 NOTE — [facility] Suicide Risk Assessment (Signed)
[facility] INPATIENT:  Family/Significant Other Suicide Prevention Education  Suicide Prevention Education:  Education Completed; No one has been identified by the patient as the family member/significant other with whom the patient will be residing, and identified as the person(s) who will aid the patient in the event of a mental health crisis (suicidal ideations/suicide attempt).  With written consent from the patient, the family member/significant other has been provided the following suicide prevention education, prior to the and/or following the discharge of the patient.  The suicide prevention education provided includes the following:  Suicide risk factors  Suicide prevention and interventions  National Suicide Hotline telephone number  [facility] assessment telephone number  [facility] Emergency Assistance 911  [facility] and/or Residential Mobile Crisis Unit telephone number  Request made of family/significant other to:  Remove weapons (e.g., guns, rifles, knives), all items previously/currently identified as safety concern.    Remove drugs/medications (over-the-counter, prescriptions, illicit drugs), all items previously/currently identified as a safety concern.  The family member/significant other verbalizes understanding of the suicide prevention education information provided.  The family member/significant other agrees to remove the items of safety concern listed above. The patient did not endorse SI at the time of admission, nor did the patient c/o SI during the stay here.  SPE not required.  Baldo DaubRodney B [facility] 07/16/2016, 4:08 PM

## 2016-07-16 NOTE — Progress Notes (Signed)
  [facility] :  Will you be returning to the same living situation after discharge:  No. At discharge, do you have transportation home?: Yes,  [facility] Do you have the ability to pay for your medications: Yes,  MCR  Release of information consent forms completed and in the chart;  Patient's signature needed at discharge.  Patient to Follow up at: Follow-up Information    [facility] Follow up.   Contact information: [address] [place] [address] [phone]           Next level of care provider has access to [facility] Link:no  Safety Planning and Suicide Prevention discussed: Yes,  yes  Have you used any form of tobacco in the last 30 days? (Cigarettes, Smokeless Tobacco, Cigars, and/or Pipes): No  Has patient been referred to the Quitline?: Patient refused referral  Patient has been referred for addiction treatment: Pt. refused referral  Ida RogueRodney B Mishael Haran 07/16/2016, 4:04 PM

## 2016-07-17 MED ORDER — HYDROXYZINE HCL 25 MG PO TABS: 25.0000 mg | ORAL_TABLET | Freq: Three times a day (TID) | ORAL | 0 refills | Status: DC

## 2016-07-17 MED ORDER — OLANZAPINE 5 MG PO TBDP: 5.0000 mg | ORAL_TABLET | Freq: Every day | ORAL | 0 refills | Status: DC

## 2016-07-17 MED ORDER — LAMOTRIGINE 25 MG PO TABS: 50.0000 mg | ORAL_TABLET | Freq: Every day | ORAL | 0 refills | Status: DC

## 2016-07-17 MED ORDER — LAMOTRIGINE 100 MG PO TABS: 100.0000 mg | ORAL_TABLET | Freq: Every evening | ORAL | 0 refills | Status: DC

## 2016-07-17 MED ORDER — LORAZEPAM 0.5 MG PO TABS: 0.5000 mg | ORAL_TABLET | Freq: Two times a day (BID) | ORAL | 0 refills | Status: DC

## 2016-07-17 MED ORDER — ATORVASTATIN CALCIUM 10 MG PO TABS: 10.0000 mg | ORAL_TABLET | Freq: Every day | ORAL | 0 refills | Status: DC

## 2016-07-17 MED ORDER — PALIPERIDONE PALMITATE 156 MG/ML IM SUSP: 156.0000 mg | INTRAMUSCULAR | 0 refills | Status: DC

## 2016-07-17 MED ORDER — TRAZODONE HCL 100 MG PO TABS: 100.0000 mg | ORAL_TABLET | Freq: Every day | ORAL | 0 refills | Status: DC

## 2016-07-17 NOTE — Progress Notes (Signed)
Pt discharged to a group home called [facility] with their staff member. Pt was ambulatory, stable and appreciative at that time. All papers and prescriptions were given and valuables returned. Verbal understanding expressed. Denies SI/HI and A/VH. Pt given opportunity to express concerns and ask questions.

## 2016-07-17 NOTE — Progress Notes (Signed)
Recreation Therapy Notes  Date: 07/17/16 Time: 1000 Location: 500 Hall Dayroom  Group Topic: Leisure Education  Goal Area(s) Addresses:  Patient will identify positive leisure activities.  Patient will identify one positive benefit of participation in leisure activities.   Behavioral Response: Observed  Intervention: Various leisure activities on strips of paper, dry erase board, dry erase marker, stopwatch   Activity: Leisure Pictionary.  Patients were divided into teams of 2 and given one minute to guess as many activities as possible.  One person from Team 1 would come up, select a strip of paper from the can and draw the activity on the board or act it out.  If their team guessed the activity, the person was to get another activity from the can.  This would continue until the time ran out.  Whatever activity they ended on, if they were unable to guess the activity, Team 2 would get the opportunity to "steal" the point.  Team 2 would start the process over again.  The team with the most points wins.  Education:  Leisure Education, Building control surveyorDischarge Planning  Education Outcome: Acknowledges education/In group clarification offered/Needs additional education  Clinical Observations/Feedback: Pt observed his peers.  Pt was smiling at times during group.    Caroll RancherMarjette Hazelynn Mckenny, LRT/CTRS      Caroll RancherLindsay, Arrielle Mcginn A 07/17/2016 12:16 PM

## 2016-07-17 NOTE — [place] FL2 (Signed)
[place] MEDICAID FL2 LEVEL OF CARE SCREENING TOOL     IDENTIFICATION  Patient Name: David Daviesaul Gilkerson Jr. Birthdate: 02/13/1981 Sex: male Admission Date (Current Location): 06/24/2016  [facility] [place]  [id number]   Facility and Address:   [facility] [address] [place] [phone])      Provider Number: [id number]  Attending Physician Name and Address:  Jomarie LongsSaramma Eappen, MD  Relative Name and Phone Number:       Current Level of Care: Hospital (Psychiatric) Recommended Level of Care: [facility]isted [facility] Prior Approval Number:    Date Approved/Denied:   PASRR Number:  [id number]  Discharge Plan: [facility]    Current Diagnoses: Patient Active Problem List   Diagnosis Date Noted  . TBI (traumatic brain injury) (HCC) 07/10/2016  . Vitamin B12 deficiency 07/10/2016  . Noncompliance with medication regimen 06/25/2016  . Cocaine use disorder, mild, abuse 06/25/2016  . Cannabis use disorder, mild, abuse 06/25/2016  . Paranoid schizophrenia (HCC) 06/24/2016    Orientation RESPIRATION BLADDER Height & Weight     Self, Situation, Time, Place  Normal Continent Weight: 130 lb (59 kg) Height:  5\' 9"  (175.3 cm)  BEHAVIORAL SYMPTOMS/MOOD NEUROLOGICAL BOWEL NUTRITION STATUS      Continent  (Regular diet)  AMBULATORY STATUS COMMUNICATION OF NEEDS Skin   Independent Verbally Normal                       Personal Care Assistance Level of Assistance  Bathing, Feeding, Dressing Bathing Assistance: Independent Feeding assistance: Independent Dressing Assistance: Independent     Functional Limitations Info   (None) Sight Info: Adequate Hearing Info: Adequate Speech Info: Adequate    SPECIAL CARE FACTORS FREQUENCY                       Contractures Contractures Info: Not present    Additional Factors Info  Psychotropic     Psychotropic Info: Psychiatric Meds         Current Medications (07/17/2016):  This is the  current hospital active medication list  atorvastatin 10 MG tablet  Commonly known as: LIPITOR  Take 1 tablet (10 mg total) by mouth daily at 6 PM.  What changed: when to take this  For: High Amount of Fats in the Blood  Next does due:               hydrOXYzine 25 MG tablet  Commonly known as: ATARAX/VISTARIL  Take 1 tablet (25 mg total) by mouth 3 (three) times daily.  For: Anxiety Neurosis, EPS prevention  Next does due:               * lamoTRIgine 100 MG tablet  Commonly known as: LAMICTAL  Take 1 tablet (100 mg total) by mouth every evening.  What changed: You were already taking a medication with the same name, and this prescription was added. Make sure you understand how and when to take each.  For: MOOD STABILIZATION  Next does due:               * lamoTRIgine 25 MG tablet  Commonly known as: LAMICTAL  Start taking on: 07/18/2016  Take 2 tablets (50 mg total) by mouth daily.  What changed:  0 medication strength  0 how much to take  0 when to take this  For: mood stabilization  Next does due:  LORazepam 0.5 MG tablet  Commonly known as: ATIVAN  Take 1 tablet (0.5 mg total) by mouth 2 (two) times daily.  What changed:  0 when to take this  0 reasons to take this  For: Agitation, Schizophrenia  Next does due:               OLANZapine zydis 5 MG disintegrating tablet  Commonly known as: ZYPREXA  Take 1 tablet (5 mg total) by mouth at bedtime.  For: Schizophrenia  Next does due:               paliperidone 156 MG/ML Susp injection  Commonly known as: INVEGA SUSTENNA  Start taking on: 08/13/2016  Inject 1 mL (156 mg total) into the muscle every 28 (twenty-eight) days. NEXT DOSE DUE August 13, 2016  For: Schizophrenia  Next does due:               traZODone 100 MG tablet  Commonly known as: DESYREL  Take 1 tablet (100 mg total) by mouth at bedtime.  For: Trouble Sleeping  Next does due:           Discharge  Medications: Same as above  Relevant Imaging Results:  Relevant Lab Results:   Additional Information    Ida Rogueodney B Taiten Brawn, LCSW

## 2016-07-17 NOTE — Tx Team (Signed)
Interdisciplinary Treatment and Diagnostic Plan Update  07/17/2016 Time of Session: 11:06 AM  David Davila. MRN: [id number]  Principal Diagnosis: Paranoid schizophrenia ([place])  Secondary Diagnoses: Principal Problem:   Paranoid schizophrenia ([place]) Active Problems:   Noncompliance with medication regimen   Cocaine use disorder, mild, abuse   Cannabis use disorder, mild, abuse   Vitamin B12 deficiency   Current Medications:  Current Facility-Administered Medications  Medication Dose Route Frequency Provider Last Rate Last Dose  . acetaminophen (TYLENOL) tablet 650 mg  650 mg Oral Q6H PRN Patrecia Pour, NP      . alum & mag hydroxide-simeth (MAALOX/MYLANTA) 200-200-20 MG/5ML suspension 30 mL  30 mL Oral Q4H PRN Patrecia Pour, NP      . atorvastatin (LIPITOR) tablet 10 mg  10 mg Oral q1800 Ursula Alert, MD   10 mg at 07/16/16 1706  . cyanocobalamin ((VITAMIN B-12)) injection 1,000 mcg  1,000 mcg Intramuscular Once Ursula Alert, MD      . hydrOXYzine (ATARAX/VISTARIL) tablet 25 mg  25 mg Oral TID Patrecia Pour, NP   25 mg at 07/17/16 0820  . lamoTRIgine (LAMICTAL) tablet 100 mg  100 mg Oral QPM Saramma Eappen, MD   100 mg at 07/16/16 1705  . lamoTRIgine (LAMICTAL) tablet 50 mg  50 mg Oral Daily Ursula Alert, MD   50 mg at 07/17/16 0820  . LORazepam (ATIVAN) tablet 1 mg  1 mg Oral Q6H PRN Ursula Alert, MD   1 mg at 07/13/16 2219   Or  . LORazepam (ATIVAN) injection 1 mg  1 mg Intramuscular Q6H PRN Ursula Alert, MD      . LORazepam (ATIVAN) tablet 0.5 mg  0.5 mg Oral BID Ursula Alert, MD   0.5 mg at 07/17/16 0821  . magnesium hydroxide (MILK OF MAGNESIA) suspension 30 mL  30 mL Oral Daily PRN Patrecia Pour, NP      . OLANZapine zydis (ZYPREXA) disintegrating tablet 10 mg  10 mg Oral BID PRN Rozetta Nunnery, NP   10 mg at 07/13/16 2218   Or  . OLANZapine (ZYPREXA) injection 10 mg  10 mg Intramuscular BID PRN Rozetta Nunnery, NP   10 mg at 07/08/16 0404  . OLANZapine zydis  (ZYPREXA) disintegrating tablet 5 mg  5 mg Oral QHS Ursula Alert, MD   5 mg at 07/16/16 2148  . [START ON 08/13/2016] paliperidone (INVEGA SUSTENNA) injection 156 mg  156 mg Intramuscular Q28 days Ursula Alert, MD      . traZODone (DESYREL) tablet 100 mg  100 mg Oral QHS Ursula Alert, MD   100 mg at 07/16/16 2148  . tuberculin injection 5 Units  5 Units Intradermal Once Ursula Alert, MD        PTA Medications: Prescriptions Prior to Admission  Medication Sig Dispense Refill Last Dose  . atorvastatin (LIPITOR) 10 MG tablet Take 10 mg by mouth at bedtime.   03/09/2016 at Unknown time  . cyanocobalamin (,VITAMIN B-12,) 1000 MCG/ML injection Inject 1,000 mcg into the muscle every 30 (thirty) days.   Past Month at Unknown time  . lamoTRIgine (LAMICTAL) 100 MG tablet Take 100 mg by mouth 2 (two) times daily.   03/09/2016 at Unknown time  . LORazepam (ATIVAN) 0.5 MG tablet Take 0.5 mg by mouth every 6 (six) hours as needed for anxiety.   03/09/2016 at Unknown time    Treatment Modalities: Medication Management, Group therapy, Case management,  1 to 1 session with clinician, Psychoeducation, Recreational therapy.  Physician Treatment Plan for Primary Diagnosis: Paranoid schizophrenia ([place]) Long Term Goal(s): Improvement in symptoms so as ready for discharge  Short Term Goals: Compliance with prescribed medications will improve  Medication Management: Evaluate patient's response, side effects, and tolerance of medication regimen.  Therapeutic Interventions: 1 to 1 sessions, Unit Group sessions and Medication administration.  Evaluation of Outcomes: Adequate for Discharge  11/20::Patient today seen as  delusional ,disorganized , and labile.  Will continue to readjust medications. Will cross taper Haldol with Invega for psychosis . Haldol likely not effective . Will increase Invega to 6 mg po qhs. Will continue Cogentin 0.5 mg po bid for EPS.  For mood lability: Increased Lamictal to  25 mg po BID.  11/24: :patient today continues to be not cooperative when Probation officer attempted to evaluate Water quality scientist made several attempts today .  Paranoid schizophrenia ([place]) unstable  For psychosis: Will continue Invega  9 mg po/  Zyprexa 10 mg IM daily . Will continue Cogentin 0.5 mg po/IM bid for EPS.  For mood lability: Will continue  Lamictal to 50 mg po BID.Pt has been refusing it . Will continue  Ativan 0.5 mg po bid.  11/29:  For psychosis: Will discontinue Invega PO - since he is on Invega sustenna IM. Invega sustenna IM 234 mg x 1 dose - 07/09/16, Invega sustenna IM 156 mg IM - next dose - 07/15/16 , then repeat 156 mg IM q 28 days.  12/4: Presentation remains much the same Meds remain the same-will get second dose of Invega sustenna today   Physician Treatment Plan for Secondary Diagnosis: Principal Problem:   Paranoid schizophrenia ([place]) Active Problems:   Noncompliance with medication regimen   Cocaine use disorder, mild, abuse   Cannabis use disorder, mild, abuse   Vitamin B12 deficiency   Long Term Goal(s): Improvement in symptoms so as ready for discharge  Short Term Goals: Ability to verbalize feelings will improve  Medication Management: Evaluate patient's response, side effects, and tolerance of medication regimen.  Therapeutic Interventions: 1 to 1 sessions, Unit Group sessions and Medication administration.  Evaluation of Outcomes: Adequate for Discharge   RN Treatment Plan for Primary Diagnosis: Paranoid schizophrenia ([place]) Long Term Goal(s): Knowledge of disease and therapeutic regimen to maintain health will improve  Short Term Goals: Ability to identify and develop effective coping behaviors will improve and Compliance with prescribed medications will improve  Medication Management: RN will administer medications as ordered by provider, will assess and evaluate patient's response and provide education to patient for prescribed medication. RN will  report any adverse and/or side effects to prescribing provider.  Therapeutic Interventions: 1 on 1 counseling sessions, Psychoeducation, Medication administration, Evaluate responses to treatment, Monitor vital signs and CBGs as ordered, Perform/monitor CIWA, COWS, AIMS and Fall Risk screenings as ordered, Perform wound care treatments as ordered.  Evaluation of Outcomes: Adequate for Discharge   Recreational Therapy Treatment Plan for Primary Diagnosis: Paranoid schizophrenia ([place]) Long Term Goal(s): LTG- Patient will participate in recreation therapy tx in at least 2 group sessions without prompting from LRT.  Short Term Goals:  Patient will be able to identify at least 5 coping skills for admitting dx by conclusion of recreation therapy tx.  Treatment Modalities: Group and Pet Therapy  Therapeutic Interventions: Psychoeducation  Evaluation of Outcomes: Adequate for Discharge   LCSW Treatment Plan for Primary Diagnosis: Paranoid schizophrenia ([place]) Long Term Goal(s): Safe transition to appropriate next level of care at discharge, Engage patient in therapeutic group addressing interpersonal concerns.  Short Term Goals: Engage patient in aftercare planning with referrals and resources  Therapeutic Interventions: Assess for all discharge needs, 1 to 1 time with Social worker, Explore available resources and support systems, Assess for adequacy in community support network, Educate family and significant other(s) on suicide prevention, Complete Psychosocial Assessment, Interpersonal group therapy.  Evaluation of Outcomes: Met  Have spoken with guardian, Chapman Moss, [phone] who is attempting to find placement for patient.  In the meantime, pt will be [facility] on [facility] wait list. 11/20:  Pt on [place] wait list.  [facility] has come to do evaluation.  Pt refused PPD-will need to get chest xray.  Guardian sent back signed releases today so we can try to access records at previous  hospitalizations 11/24:  Briar visited on Wed.  Stated that patient is not at baseline-was talking to guardian about non-existent people and situations that did not occur. 11/29:  Guardian came to visit today.  Told pt he thought he needed about another week.  CSW told guardian pt would be d/ced no later than Wed of next week-1 week.  Guardian stated he alone is not responsible for pt placement and asked CSW for help.  CSW to contact potential GH, while reminding guardian that he is ultimately responsible for transporation of pt away from facility. 12/4:  CSW began with search in [place] and [place] last week, expanded to [place] on Friday as no takers.  Will continue to look for placement.  Sent guardian a letter stating we would plan on patient pick up at 1:00 on Wednesday 12/6  [facility] in [place] willing to take pt., follow up [place] in Treatment: Attending groups: No Participating in groups:No Taking medication as prescribed: Yes Toleration medication: Yes, no side effects reported at this time Family/Significant other contact made: Yes  Guardian Patient understands diagnosis: No  Limited insight Discussing patient identified problems/goals with staff: Yes Medical problems stabilized or resolved: Yes Denies suicidal/homicidal ideation: Yes Issues/concerns per patient self-inventory: None Other: N/A  New problem(s) identified: None identified at this time.   New Short Term/Long Term Goal(s): None identified at this time.   Discharge Plan or Barriers:   Reason for Continuation of Hospitalization:     Estimated Length of Stay: D/C today  Attendees: Patient: 07/17/2016  11:06 AM  Physician: Ursula Alert, MD 07/17/2016  11:06 AM  Nursing: Hedy Jacob RN 07/17/2016  11:06 AM  RN Care Manager: Lars Pinks, RN 07/17/2016  11:06 AM  Social Worker: Ripley Fraise 07/17/2016  11:06 AM  Recreational Therapist: Winfield Cunas 07/17/2016   11:06 AM  Other: Norberto Sorenson 07/17/2016  11:06 AM  Other:  07/17/2016  11:06 AM    Scribe for Treatment Team:  Roque Lias LCSW 07/17/2016 11:06 AM

## 2016-07-17 NOTE — Plan of Care (Signed)
[facility] in Recreation Therapeutic Interventions Goal: STG-Patient will identify at least five coping skills for ** STG: Coping Skills - Patient will be able to identify at least 5 coping skills for anger by conclusion of recreation therapy tx  Outcome: Adequate for Discharge Pt attended leisure education, stress management and self-esteem recreation therapy sessions.  Caroll RancherMarjette Jerrick Farve, LRT/CTRS

## 2016-07-17 NOTE — Progress Notes (Signed)
  [facility] Discharge Plan :  Will you be returning to the same living situation after discharge:  No. At discharge, do you have transportation home?: Yes,  [facility] Do you have the ability to pay for your medications: Yes,  M[facility]  Release of information consent forms completed and in the chart;  Patient's signature needed at discharge.  Patient to Follow up at: Follow-up Information    [facility] Follow up on 07/19/2016.   Why:  Friday at 12:15 Contact information: [address] [place] [address] [phone]           Next level of care provider has access to [facility] Link:no  Safety Planning and Suicide Prevention discussed: Yes,  yes  Have you used any form of tobacco in the last 30 days? (Cigarettes, Smokeless Tobacco, Cigars, and/or Pipes): No  Has patient been referred to the Quitline?: Patient refused referral  Patient has been referred for addiction treatment: Pt. refused referral  David RogueRodney B Jadon Davila 07/17/2016, 11:05 AM

## 2016-07-17 NOTE — Discharge Summary (Signed)
Physician Discharge Summary Note  Patient:  David Daviesaul Thelen Jr. is an 35 y.o., male MRN:  [id number] DOB:  04/11/1981 Patient phone:  [phone] (home)  Patient address:   [address] [place] [address],  Total Time spent with patient: 30 minutes  Date of Admission:  06/24/2016 Date of Discharge: 07/17/2016  Reason for Admission:  Disorganized behavior  Principal Problem: Paranoid schizophrenia [facility](HCC) Discharge Diagnoses: Patient Active Problem List   Diagnosis Date Noted  . TBI (traumatic brain injury) (HCC) [S06.9X9A] 07/10/2016  . Vitamin B12 deficiency [E53.8] 07/10/2016  . Noncompliance with medication regimen [Z91.14] 06/25/2016  . Cocaine use disorder, mild, abuse [F14.10] 06/25/2016  . Cannabis use disorder, mild, abuse [F12.10] 06/25/2016  . Paranoid schizophrenia (HCC) [F20.0] 06/24/2016    Past Psychiatric History: see HPI  Past Medical History:  Past Medical History:  Diagnosis Date  . Acute psychosis   . Cannabis abuse    Past  . Paranoid schizophrenia (HCC)    History reviewed. No pertinent surgical history. Family History: History reviewed. No pertinent family history. Family Psychiatric  History: see HPI Social History:  History  Alcohol Use No     History  Drug Use  . Types: Cocaine, Marijuana    Social History   Social History  . Marital status: Single    Spouse name: N/A  . Number of children: N/A  . Years of education: N/A   Social History Main Topics  . Smoking status: Current Every Day Smoker    Packs/day: 1.00    Types: Cigarettes  . Smokeless tobacco: Never Used  . Alcohol use No  . Drug use:     Types: Cocaine, Marijuana  . Sexual activity: Not Asked   Other Topics Concern  . None   Social History Narrative  . None    Hospital Course:  David Davilais a 35 y.o.AA male, who is single, on SSD , who has a hx of schizophrenia and noncompliance with medications, [facility] [place] , [facility] behavior.      David DaviesPaul Sherrow Jr. was admitted for Paranoid schizophrenia [facility](HCC) and crisis management.  Patient was treated with medications with their indications listed below in detail under Medication List.  Medical problems were identified and treated as needed.  Home medications were restarted as appropriate.  During course of inpatient stay, patient needed redirection, was placed on 1;1 staffing to keep patients and other patients safe.  Patient engaged in intermittent verbal arguments with staff and others.  Patient was manic and labile.  Supportive therapy given.  He had been compliant on medications (was also placed on a forced medication protocol) and denied side effects.  Support and encouragement was provided.  Improvement was monitored by observation and David DaviesPaul Coopersmith Jr. daily report of symptom reduction.  Emotional and mental status was monitored by daily self inventory reports completed by David DaviesPaul Ramanathan Jr. and clinical staff.         David DaviesPaul Smartt Jr. was evaluated by the treatment team for stability and plans for continued recovery upon discharge.  Patient was offered further treatment options upon discharge including Residential, Intensive Outpatient and Outpatient treatment. Patient will follow up with agency listed below for medication management and counseling.  Encouraged patient to maintain satisfactory support network and home environment.  Advised to adhere to medication compliance and outpatient treatment follow up.  Prescriptions provided.  David DaviesPaul Maeder Jr. motivation was an integral factor for scheduling further treatment.  Employment, transportation, bed availability, health status, family support, and any pending legal issues were also considered during patient's hospital stay.  Upon completion of this admission the patient was both mentally and medically stable for discharge denying suicidal/homicidal ideation,  auditory/visual/tactile hallucinations, delusional thoughts and paranoia.      Physical Findings: AIMS: Facial and Oral Movements Muscles of Facial Expression: None, normal Lips and Perioral Area: None, normal Jaw: None, normal Tongue: None, normal,Extremity Movements Upper (arms, wrists, hands, fingers): None, normal Lower (legs, knees, ankles, toes): None, normal, Trunk Movements Neck, shoulders, hips: None, normal, Overall Severity Severity of abnormal movements (highest score from questions above): None, normal Incapacitation due to abnormal movements: None, normal Patient's awareness of abnormal movements (rate only patient's report): No Awareness, Dental Status Current problems with teeth and/or dentures?: No Does patient usually wear dentures?: No  CIWA:  CIWA-Ar Total: 1 COWS:  COWS Total Score: 2  Musculoskeletal: Strength & Muscle Tone: within normal limits Gait & Station: normal Patient leans: N/A  Psychiatric Specialty Exam:  SEE MD SRA Physical Exam  Nursing note and vitals reviewed. Psychiatric: He has a normal mood and affect. His behavior is normal. Judgment and thought content normal.    Review of Systems  All other systems reviewed and are negative.   Blood pressure 116/69, pulse 95, temperature 97.5 F (36.4 C), temperature source Oral, resp. rate 18, height 5\' 9"  (1.753 m), weight 59 kg (130 lb).Body mass index is 19.2 kg/m.    Have you used any form of tobacco in the last 30 days? (Cigarettes, Smokeless Tobacco, Cigars, and/or Pipes): No  Has this patient used any form of tobacco in the last 30 days? (Cigarettes, Smokeless Tobacco, Cigars, and/or Pipes) Yes, N/A  Blood Alcohol level:  Lab Results  Component Value Date   ETH <5 06/22/2016   ETH <5 03/10/2016    Metabolic Disorder Labs:  No results found for: HGBA1C, MPG No results found for: PROLACTIN No results found for: CHOL, TRIG, HDL, CHOLHDL, VLDL, LDLCALC  See Psychiatric Specialty Exam  and Suicide Risk Assessment completed by Attending Physician prior to discharge.  Discharge destination:  Home  Is patient on multiple antipsychotic therapies at discharge:  Yes,   Do you recommend tapering to monotherapy for antipsychotics?  per outpatient provider management    Has Patient had three or more failed trials of antipsychotic monotherapy by history:  Yes,   Antipsychotic medications that previously failed include:   1.  Geodon., 2.  Haldol. and 3.  Risperdal.  Recommended Plan for Multiple Antipsychotic Therapies: NA     Medication List    STOP taking these medications   cyanocobalamin 1000 MCG/ML injection Commonly known as:  (VITAMIN B-12)     TAKE these medications     Indication  atorvastatin 10 MG tablet Commonly known as:  LIPITOR Take 1 tablet (10 mg total) by mouth daily at 6 PM. What changed:  when to take this  Indication:  High Amount of Fats in the Blood   hydrOXYzine 25 MG tablet Commonly known as:  ATARAX/VISTARIL Take 1 tablet (25 mg total) by mouth 3 (three) times daily.  Indication:  Anxiety Neurosis, EPS prevention   lamoTRIgine 100 MG tablet Commonly known as:  LAMICTAL Take 1 tablet (100 mg total) by mouth every evening. What changed:  You were already taking a medication with the same name, and this prescription was added. Make sure you  understand how and when to take each.  Indication:  MOOD STABILIZATION   lamoTRIgine 25 MG tablet Commonly known as:  LAMICTAL Take 2 tablets (50 mg total) by mouth daily. Start taking on:  07/18/2016 What changed:  medication strength  how much to take  when to take this  Indication:  mood stabilization   LORazepam 0.5 MG tablet Commonly known as:  ATIVAN Take 1 tablet (0.5 mg total) by mouth 2 (two) times daily. What changed:  when to take this  reasons to take this  Indication:  Agitation, Schizophrenia   OLANZapine zydis 5 MG disintegrating tablet Commonly known as:  ZYPREXA Take 1  tablet (5 mg total) by mouth at bedtime.  Indication:  Schizophrenia   paliperidone 156 MG/ML Susp injection Commonly known as:  INVEGA SUSTENNA Inject 1 mL (156 mg total) into the muscle every 28 (twenty-eight) days. NEXT DOSE DUE August 13, 2016 Start taking on:  08/13/2016  Indication:  Schizophrenia   traZODone 100 MG tablet Commonly known as:  DESYREL Take 1 tablet (100 mg total) by mouth at bedtime.  Indication:  Trouble Sleeping      Follow-up Information    [employer] Follow up.   Contact information: [address] [place] [address] [phone]           Follow-up recommendations:  Activity:  as tol Diet:  as tol  Comments:  1.  Take all your medications as prescribed.   2.  Report any adverse side effects to outpatient provider. 3.  Patient instructed to not use alcohol or illegal drugs while on prescription medicines. 4.  In the event of worsening symptoms, instructed patient to call 911, the crisis hotline or go to nearest emergency room for evaluation of symptoms.  Signed: Lindwood Qua, NP [facility] 07/17/2016, 11:00 AM

## 2016-07-17 NOTE — [facility]HH Suicide Risk Assessment (Signed)
[facility]   Principal Problem: Paranoid schizophrenia [facility](HCC) Discharge Diagnoses:  Patient Active Problem List   Diagnosis Date Noted  . TBI (traumatic brain injury) (HCC) [S06.9X9A] 07/10/2016  . Vitamin B12 deficiency [E53.8] 07/10/2016  . Noncompliance with medication regimen [Z91.14] 06/25/2016  . Cocaine use disorder, mild, abuse [F14.10] 06/25/2016  . Cannabis use disorder, mild, abuse [F12.10] 06/25/2016  . Paranoid schizophrenia (HCC) [F20.0] 06/24/2016    Total Time spent with patient: 30 minutes  Musculoskeletal: Strength & Muscle Tone: within normal limits Gait & Station: normal Patient leans: N/A  Psychiatric Specialty Exam: Review of Systems  All other systems reviewed and are negative.   Blood pressure 116/69, pulse 95, temperature 97.5 F (36.4 C), temperature source Oral, resp. rate 18, height 5\' 9"  (1.753 m), weight 59 kg (130 lb).Body mass index is 19.2 kg/m.  General Appearance: Casual  Eye Contact::  Fair  Speech:  Clear and Coherent409  Volume:  Normal  Mood:  Euthymic  Affect:  Appropriate  Thought Process:  Goal Directed and Descriptions of Associations: Intact  Orientation:  Full (Time, Place, and Person)  Thought Content:  Logical  Suicidal Thoughts:  No  Homicidal Thoughts:  No  Memory:  Immediate;   Fair Recent;   Fair Remote;   Fair  Judgement:  Fair  Insight:  Fair  Psychomotor Activity:  Normal  Concentration:  Fair  Recall:  FiservFair  Fund of Knowledge:Fair  Language: Fair  Akathisia:  No  Handed:  Right  AIMS (if indicated):   0  Assets:  Desire for Improvement  Sleep:  Number of Hours: 6.5  Cognition: WNL  ADL's:  Intact   Mental Status Per Nursing Assessment::   On Admission:  NA  Demographic Factors:  Male  Loss Factors: NA  Historical Factors: Impulsivity  Risk Reduction Factors:   Positive social support and Positive therapeutic relationship  Continued Clinical Symptoms:  Previous  Psychiatric Diagnoses and Treatments  Cognitive Features That Contribute To Risk:  None    Suicide Risk:  Minimal: No identifiable suicidal ideation.  Patients presenting with no risk factors but with morbid ruminations; may be classified as minimal risk based on the severity of the depressive symptoms  Follow-up Information    [facility].   Contact information: [address] [place] [address] [phone]           Plan Of Care/Follow-up recommendations:  Activity:  no restrictions Diet:  Invega sustenna IM 156 mg - next dose due on 08/13/16 Tests:  as needed Other:  none  Aden Youngman, MD 07/17/2016, 9:38 AM

## 2017-08-15 IMAGING — CR DG CHEST 2V
2 series · 2 of 2 positions shown · non-contrast
Comparison: None.

CLINICAL DATA: Tuberculosis screening

EXAM:
CHEST  2 VIEW

[w chest pa]
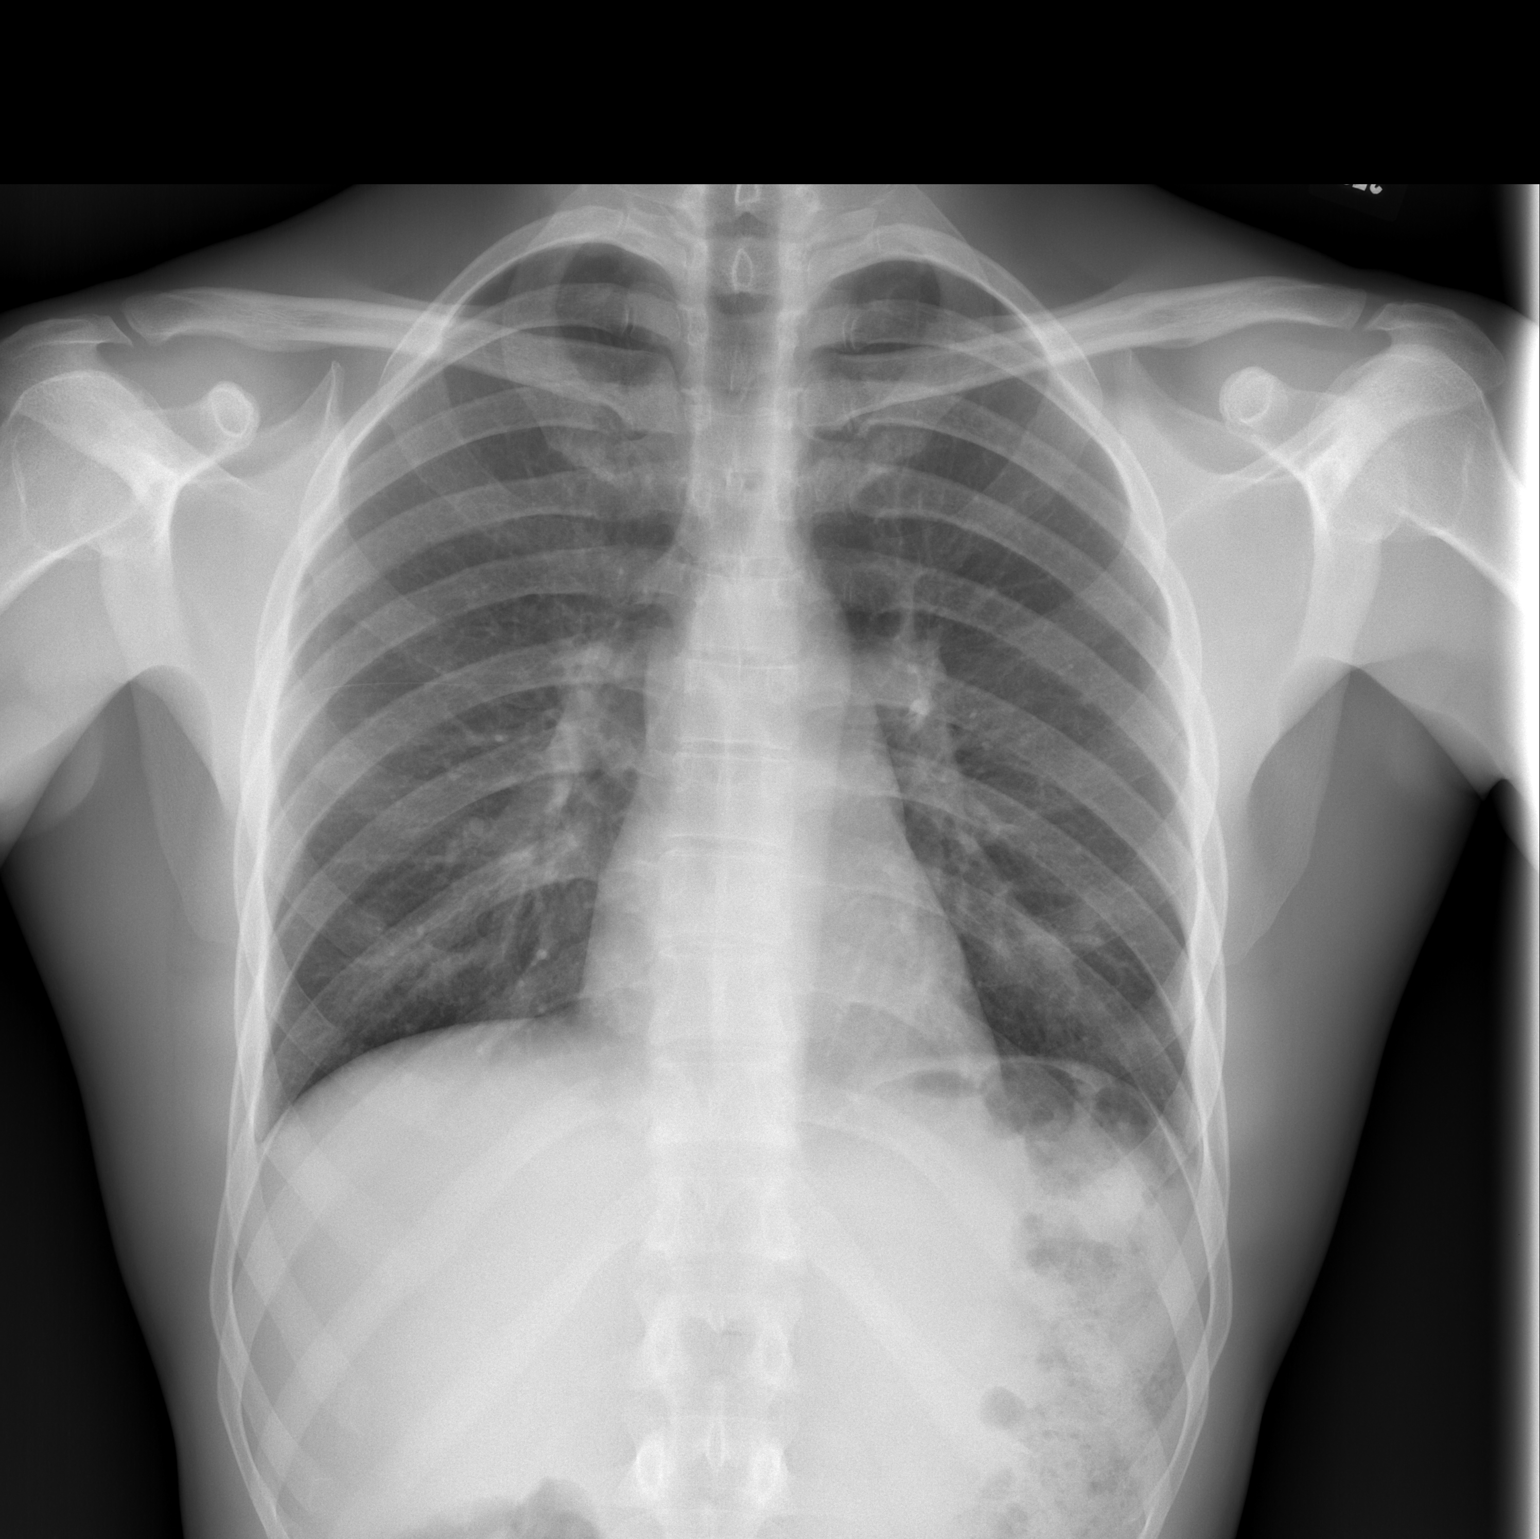

[w chest lat]
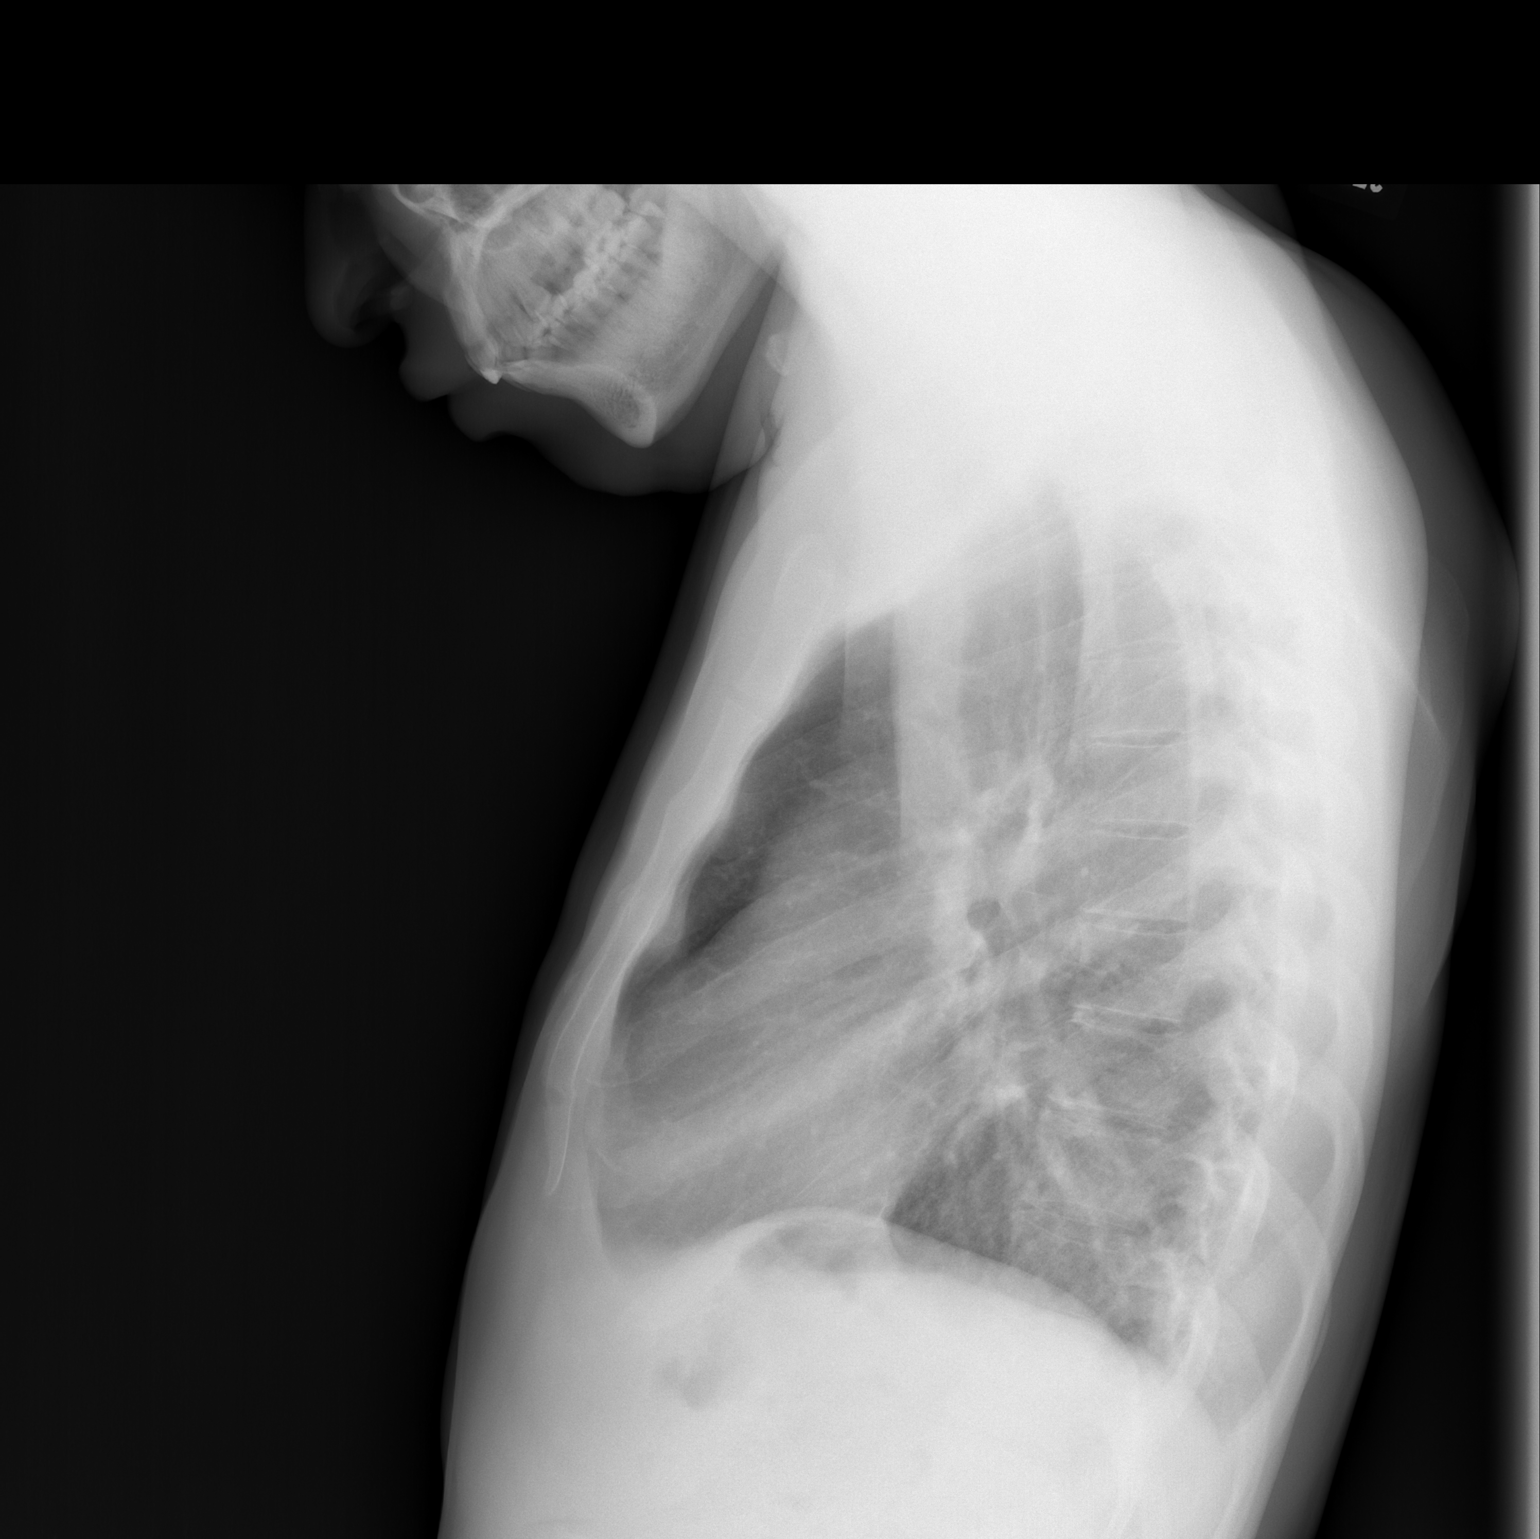

[2 of 2 positions shown; findings below may reference images not displayed]

FINDINGS: Lungs are clear. Heart size and pulmonary vascularity are normal. No
adenopathy. No bone lesions.
IMPRESSION: No abnormality noted.

## 2017-11-15 DIAGNOSIS — Z79899 Other long term (current) drug therapy: Secondary | ICD-10-CM | POA: Diagnosis not present

## 2017-11-15 DIAGNOSIS — F200 Paranoid schizophrenia: Secondary | ICD-10-CM | POA: Insufficient documentation

## 2017-11-15 DIAGNOSIS — F17210 Nicotine dependence, cigarettes, uncomplicated: Secondary | ICD-10-CM | POA: Insufficient documentation

## 2017-11-15 DIAGNOSIS — R440 Auditory hallucinations: Secondary | ICD-10-CM | POA: Diagnosis present

## 2017-11-15 LAB — COMPREHENSIVE METABOLIC PANEL
ALBUMIN: 3.7 g/dL (ref 3.5–5.0)
ALK PHOS: 65 U/L (ref 38–126)
ALT: 13 U/L — AB (ref 17–63)
AST: 21 U/L (ref 15–41)
Anion gap: 7 (ref 5–15)
BUN: 15 mg/dL (ref 6–20)
CALCIUM: 8.6 mg/dL — AB (ref 8.9–10.3)
CO2: 27 mmol/L (ref 22–32)
CREATININE: 0.97 mg/dL (ref 0.61–1.24)
Chloride: 105 mmol/L (ref 101–111)
GFR calc Af Amer: >60 mL/min (ref >60)
GFR calc non Af Amer: >60 mL/min (ref >60)
GLUCOSE: 86 mg/dL (ref 65–99)
Potassium: 3.8 mmol/L (ref 3.5–5.1)
SODIUM: 139 mmol/L (ref 135–145)
Total Bilirubin: 0.6 mg/dL (ref 0.3–1.2)
Total Protein: 7.4 g/dL (ref 6.5–8.1)

## 2017-11-15 LAB — CBC
HEMATOCRIT: 39.7 % — AB (ref 40.0–52.0)
HEMOGLOBIN: 13.2 g/dL (ref 13.0–18.0)
MCH: 30.5 pg (ref 26.0–34.0)
MCHC: 33.3 g/dL (ref 32.0–36.0)
MCV: 91.5 fL (ref 80.0–100.0)
Platelets: 141 10*3/uL — ABNORMAL LOW (ref 150–440)
RBC: 4.34 MIL/uL — ABNORMAL LOW (ref 4.40–5.90)
RDW: 13.1 % (ref 11.5–14.5)
WBC: 3.5 10*3/uL — ABNORMAL LOW (ref 3.8–10.6)

## 2017-11-15 LAB — ETHANOL: Alcohol, Ethyl (B): <10 mg/dL (ref <10)

## 2017-11-15 LAB — ACETAMINOPHEN LEVEL: Acetaminophen (Tylenol), Serum: <10 ug/mL — ABNORMAL LOW (ref 10–30)

## 2017-11-15 LAB — SALICYLATE LEVEL: Salicylate Lvl: <7 mg/dL (ref 2.8–30.0)
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
## 2017-11-15 NOTE — ED Notes (Addendum)
Facility rep, Donnal DebarBarbara Taylor, attempted to call legal gaurdian, Bertram Millardracy Boubacar prior to arrival to this ED with no success. This RN also attempted to call patient's legal gaurdian with no success.   [phone]

## 2017-11-15 NOTE — ED Triage Notes (Signed)
Patient c/o anxiety; reports that he was talking to himself. Patient reports he just recently moved to a new facility. Patient denies SI and HI. Patient states: "I just feel like I need some help".

## 2017-11-16 ENCOUNTER — Emergency Department
Admission: EM | Admit: 2017-11-16 | Discharge: 2017-11-18 | Disposition: A | Discharge status: Home / Self Care | Payer: Medicare Other | Attending: Emergency Medicine | Admitting: Emergency Medicine

## 2017-11-16 DIAGNOSIS — Z9114 Patient's other noncompliance with medication regimen: Secondary | ICD-10-CM

## 2017-11-16 DIAGNOSIS — S069XAA Unspecified intracranial injury with loss of consciousness status unknown, initial encounter: Secondary | ICD-10-CM | POA: Diagnosis present

## 2017-11-16 DIAGNOSIS — S069X9A Unspecified intracranial injury with loss of consciousness of unspecified duration, initial encounter: Secondary | ICD-10-CM | POA: Diagnosis present

## 2017-11-16 DIAGNOSIS — F200 Paranoid schizophrenia: Secondary | ICD-10-CM | POA: Diagnosis present

## 2017-11-16 DIAGNOSIS — Z91148 Patient's other noncompliance with medication regimen for other reason: Secondary | ICD-10-CM

## 2017-11-16 HISTORY — DX: Schizoaffective disorder, bipolar type: F25.0

## 2017-11-16 HISTORY — DX: Antisocial personality disorder: F60.2

## 2017-11-16 LAB — VALPROIC ACID LEVEL: Valproic Acid Lvl: 44 ug/mL — ABNORMAL LOW (ref 50.0–100.0)

## 2017-11-16 MED ORDER — DIVALPROEX SODIUM ER 500 MG PO TB24
1000.0000 mg | ORAL_TABLET | Freq: Every day | ORAL | Status: DC
  Administered 2017-11-17 – 2017-11-18 (×2): 1000 mg via ORAL
  Filled 2017-11-16 (×2): qty 2

## 2017-11-16 MED ORDER — TRAZODONE HCL 50 MG PO TABS: 50.0000 mg | ORAL_TABLET | Freq: Every evening | ORAL | Status: DC | PRN

## 2017-11-16 MED ORDER — LORATADINE 10 MG PO TABS
10.0000 mg | ORAL_TABLET | Freq: Every day | ORAL | Status: DC
  Administered 2017-11-16 – 2017-11-18 (×3): 10 mg via ORAL
  Filled 2017-11-16 (×3): qty 1

## 2017-11-16 NOTE — ED Notes (Signed)
Hourly rounding reveals patient sleeping in room. No complaints, stable, in no acute distress. Q15 minute rounds and monitoring via Security Cameras to continue. 

## 2017-11-16 NOTE — BH Assessment (Signed)
Assessment Note  David Davila. is an 37 y.o. male who presents to the ER due asking the Group Home staff to bring him. Per the patient, he felt he needed to come to the ER because he didn't feel right. He states, "I been in the room talking to myself." When asked about AV/H, he states, "It's my thoughts. It's me talking to me. It's not voices." Throughout the interview, patient denied SI/HI and AV/H. He states he has been in his current Group Home for a short period of time and do not like it. When asked about why, he states he did not know.  Patient also shared, prior to living in the current Group Home he was inpatient with [facility] and prior to that he was living in [place] in a Group Home. Patient reports, the longest he lived in a facility is two years, when he was living [place], [place].  Per the Group Home, [facility] [phone]), she brought the patient to the ER after several times of him asking her. Patient said he didn't feel good. Group Home staff, reports, the patient was in his room and she did hear him talking to himself. She offered the suggesting for him to go outside and get some fresh air but didn't, he wanted to go to the ER.   Group home states, she felt he was okay but because he kept saying he wanted to come to the ER. Group home also states he has had no violence or aggression. He have no current psychiatrist but planned to go to [facility] on Monday to start the process for his outpatient treatment and upon discharge he is able to return to the Group Home.  Patient have no current involvement with the legal system. However, he was released from prison 05/15/2014. He was incarcerated due to "Post Release Revocation." The charges and dates were; Burglary & Larceny after B&E (10/2014), Damage to Property (06/1998), Carry Concealed Weapon (02/1998) and Possession of Stolen Goods (02/1997).Diagnosis: Schizophrenia  Past Medical History:  Past Medical  History:  Diagnosis Date  . Acute psychosis (HCC)   . Antisocial personality disorder (HCC)   . Cannabis abuse    Past  . Paranoid schizophrenia (HCC)   . Schizoaffective disorder, bipolar type (HCC)     History reviewed. No pertinent surgical history.  Family History: No family history on file.  Social History:  reports that he has been smoking cigarettes.  He has been smoking about 1.00 pack per day. He has never used smokeless tobacco. He reports that he has current or past drug history. Drugs: Cocaine and Marijuana. He reports that he does not drink alcohol.  Additional Social History:  Alcohol / Drug Use Pain Medications: See PTA Prescriptions: See PTA Over the Counter: See PTA History of alcohol / drug use?: No history of alcohol / drug abuse Longest period of sobriety (when/how long): None Reported  CIWA: CIWA-Ar BP: (!) 90/54 Pulse Rate: 61 COWS:    Allergies:  Allergies  Allergen Reactions  . Zyprexa [Olanzapine] Other (See Comments)    Reaction: unknown    Home Medications:  (Not in a hospital admission)  OB/GYN Status:  No LMP for male patient.  General Assessment Data Location of Assessment: [facility] ED TTS Assessment: In system Is this a Tele or Face-to-Face Assessment?: Face-to-Face Is this an Initial Assessment or a Re-assessment for this encounter?: Initial Assessment Marital status: Single Maiden name: n/a Is patient pregnant?: No Pregnancy Status: No Living  Arrangements: Group Home([facility] [phone])) Can pt return to current living arrangement?: No Admission Status: Involuntary Is patient capable of signing voluntary admission?: No Referral Source: Self/Family/Friend Insurance type: Medicare  Medical Screening Exam [facility] Walk-in ONLY) Medical Exam completed: Yes  Crisis Care Plan Living Arrangements: Group Home([facility] [phone])) Legal Guardian: Other:([employer] [place] [phone] Name of  Psychiatrist: Upcoming appointment with [facility]  Name of Therapist: Upcoming appointment with [facility]   Education Status Is patient currently in school?: No Is the patient employed, unemployed or receiving disability?: Receiving disability income  Risk to self with the past 6 months Suicidal Ideation: No Has patient been a risk to self within the past 6 months prior to admission? : No Suicidal Intent: No Has patient had any suicidal intent within the past 6 months prior to admission? : No Is patient at risk for suicide?: No Suicidal Plan?: No Has patient had any suicidal plan within the past 6 months prior to admission? : No Access to Means: No What has been your use of drugs/alcohol within the last 12 months?: Reports of none Previous Attempts/Gestures: No(Reports of none) Other Self Harm Risks: Reports of none Triggers for Past Attempts: None known Intentional Self Injurious Behavior: None Family Suicide History: Unknown Recent stressful life event(s): Other (Comment) Persecutory voices/beliefs?: No Depression: Yes Depression Symptoms: Isolating Substance abuse history and/or treatment for substance abuse?: No Suicide prevention information given to non-admitted patients: Not applicable  Risk to Others within the past 6 months Homicidal Ideation: No Does patient have any lifetime risk of violence toward others beyond the six months prior to admission? : No Thoughts of Harm to Others: No Current Homicidal Intent: No Current Homicidal Plan: No Access to Homicidal Means: No Identified Victim: Reports of none History of harm to others?: No Assessment of Violence: None Noted Violent Behavior Description: Reports of none Does patient have access to weapons?: No Criminal Charges Pending?: No Does patient have a court date: No Is patient on probation?: No  Psychosis Hallucinations: None noted Delusions: None noted  Mental Status Report Appearance/Hygiene: Unremarkable, In  scrubs Eye Contact: Fair Motor Activity: Freedom of movement, Unremarkable Speech: Logical/coherent, Soft, Slow Level of Consciousness: Alert Mood: Pleasant Affect: Appropriate to circumstance, Flat Anxiety Level: None Thought Processes: Coherent, Relevant Judgement: Unimpaired Orientation: Person, Place, Time, Situation, Appropriate for developmental age Obsessive Compulsive Thoughts/Behaviors: Minimal  Cognitive Functioning Concentration: Normal Memory: Recent Intact, Remote Intact Is patient IDD: No Is patient DD?: No Insight: Fair Impulse Control: Fair Appetite: Good Have you had any weight changes? : No Change Sleep: No Change Total Hours of Sleep: 8 Vegetative Symptoms: None  ADLScreening [facility] Assessment Services) Patient's cognitive ability adequate to safely complete daily activities?: Yes Patient able to express need for assistance with ADLs?: Yes Independently performs ADLs?: Yes (appropriate for developmental age)  Prior Inpatient Therapy Prior Inpatient Therapy: Yes Prior Therapy Dates: Multiple Hospitalizations  Prior Therapy Facilty/Provider(s): Multiple Hospitalizations Reason for Treatment: Depression & Psychosis  Prior Outpatient Therapy Prior Outpatient Therapy: No(Upcoming Appointment w/[facility]) Does patient have an ACCT team?: No Does patient have Intensive In-House Services?  : No Does patient have [facility] services? : No Does patient have P4CC services?: No  ADL Screening (condition at time of admission) Patient's cognitive ability adequate to safely complete daily activities?: Yes Is the patient deaf or have difficulty hearing?: No Does the patient have difficulty seeing, even when wearing glasses/contacts?: No Does the patient have difficulty concentrating, remembering, or making decisions?: No  Patient able to express need for assistance with ADLs?: Yes Does the patient have difficulty dressing or bathing?: No Independently performs ADLs?: Yes  (appropriate for developmental age) Does the patient have difficulty walking or climbing stairs?: No Weakness of Legs: None Weakness of Arms/Hands: None  Home Assistive Devices/Equipment Home Assistive Devices/Equipment: None  Therapy Consults (therapy consults require a physician order) PT Evaluation Needed: No OT Evalulation Needed: No SLP Evaluation Needed: No Abuse/Neglect Assessment (Assessment to be complete while patient is alone) Abuse/Neglect Assessment Can Be Completed: Yes Physical Abuse: Denies Verbal Abuse: Denies Sexual Abuse: Denies Exploitation of patient/patient's resources: Denies Self-Neglect: Denies Values / Beliefs Cultural Requests During Hospitalization: None Spiritual Requests During Hospitalization: None Consults Spiritual Care Consult Needed: No Social Work Consult Needed: No         Child/Adolescent Assessment Running Away Risk: Denies(Patient is an adult)  Disposition:  Disposition Initial Assessment Completed for this Encounter: Yes  On Site Evaluation by:   Reviewed with Physician:    Lilyan Gilford MS, LCAS, LPC, NCC, CCSI Therapeutic Triage Specialist 11/16/2017 12:59 PM

## 2017-11-16 NOTE — ED Notes (Signed)
Report given to SOC MD.  

## 2017-11-16 NOTE — ED Notes (Signed)
BEHAVIORAL HEALTH ROUNDING Patient sleeping: Yes.   Patient alert and oriented: not applicable SLEEPING Behavior appropriate: Yes.  ; If no, describe: SLEEPING Nutrition and fluids offered: No SLEEPING Toileting and hygiene offered: NoSLEEPING Sitter present: not applicable, Q 15 min safety rounds and observation. Law enforcement present: Yes ODS 

## 2017-11-16 NOTE — ED Notes (Signed)
Patient resting quietly in room. No noted distress or abnormal behaviors noted. Will continue 15 minute checks and observation by security camera for safety. 

## 2017-11-16 NOTE — ED Notes (Signed)
Pt asleep in room [facility] RN to assess patient when he wakes. Maintained on 15 minute checks and observation by security camera for safety.

## 2017-11-16 NOTE — ED Provider Notes (Signed)
[facility] Emergency Department Provider Note   ____________________________________________   First MD Initiated Contact with Patient 11/16/17 [phone]     (approximate)  I have reviewed the triage vital signs and the nursing notes.   HISTORY  Chief Complaint Mental Evaluation    HPI David Davila. is a 37 y.o. male brought to the ED by group home staff member voluntarily for anxiety and talking to himself.  Patient with a history of paranoid schizophrenia who was recently discharged from [facility] at the end of March for psychiatric admission.  States he has been in his room for the past 3 days talking to himself.  Sounds like he is having auditory hallucinations.  Denies active SI/HI.  Feels paranoid.   Past Medical History:  Diagnosis Date  . Acute psychosis (HCC)   . Antisocial personality disorder (HCC)   . Cannabis abuse    Past  . Paranoid schizophrenia (HCC)   . Schizoaffective disorder, bipolar type [facility])     Patient Active Problem List   Diagnosis Date Noted  . TBI (traumatic brain injury) (HCC) 07/10/2016  . Vitamin B12 deficiency 07/10/2016  . Noncompliance with medication regimen 06/25/2016  . Cocaine use disorder, mild, abuse (HCC) 06/25/2016  . Cannabis use disorder, mild, abuse 06/25/2016  . Paranoid schizophrenia (HCC) 06/24/2016    History reviewed. No pertinent surgical history.  Prior to Admission medications   Medication Sig Start Date End Date Taking? Authorizing Provider  atorvastatin (LIPITOR) 10 MG tablet Take 1 tablet (10 mg total) by mouth daily at 6 PM. 07/17/16   Adonis Brook, NP  hydrOXYzine (ATARAX/VISTARIL) 25 MG tablet Take 1 tablet (25 mg total) by mouth 3 (three) times daily. 07/17/16   Adonis Brook, NP  lamoTRIgine (LAMICTAL) 100 MG tablet Take 1 tablet (100 mg total) by mouth every evening. 07/17/16   Adonis Brook, NP  lamoTRIgine (LAMICTAL) 25 MG tablet Take 2 tablets (50 mg total) by mouth  daily. 07/18/16   Adonis Brook, NP  LORazepam (ATIVAN) 0.5 MG tablet Take 1 tablet (0.5 mg total) by mouth 2 (two) times daily. 07/17/16   Adonis Brook, NP  OLANZapine zydis (ZYPREXA) 5 MG disintegrating tablet Take 1 tablet (5 mg total) by mouth at bedtime. 07/17/16   Adonis Brook, NP  paliperidone (INVEGA SUSTENNA) 156 MG/ML SUSP injection Inject 1 mL (156 mg total) into the muscle every 28 (twenty-eight) days. NEXT DOSE DUE August 13, 2016 08/13/16   Adonis Brook, NP  traZODone (DESYREL) 100 MG tablet Take 1 tablet (100 mg total) by mouth at bedtime. 07/17/16   Adonis Brook, NP    Allergies Zyprexa [olanzapine]  No family history on file.  Social History Social History   Tobacco Use  . Smoking status: Current Every Day Smoker    Packs/day: 1.00    Types: Cigarettes  . Smokeless tobacco: Never Used  Substance Use Topics  . Alcohol use: No  . Drug use: Yes    Types: Cocaine, Marijuana    Review of Systems  Constitutional: No fever/chills. Eyes: No visual changes. ENT: No sore throat. Cardiovascular: Denies chest pain. Respiratory: Denies shortness of breath. Gastrointestinal: No abdominal pain.  No nausea, no vomiting.  No diarrhea.  No constipation. Genitourinary: Negative for dysuria. Musculoskeletal: Negative for back pain. Skin: Negative for rash. Neurological: Negative for headaches, focal weakness or numbness. Psychiatric:Positive for auditory hallucinations and paranoia.  ____________________________________________   PHYSICAL EXAM:  VITAL SIGNS: ED Triage Vitals  Enc Vitals Group  BP 11/15/17 2136 130/77     Pulse Rate 11/15/17 2136 79     Resp 11/15/17 2136 18     Temp 11/15/17 2136 98.7 F (37.1 C)     Temp Source 11/15/17 2136 Oral     SpO2 11/15/17 2136 96 %     Weight 11/15/17 2136 140 lb (63.5 kg)     Height 11/15/17 2136 5\' 10"  (1.778 m)     Head Circumference --      Peak Flow --      Pain Score 11/15/17 2143 0     Pain Loc --       Pain Edu? --      Excl. in GC? --     Constitutional: Alert and oriented. Well appearing and in no acute distress. Eyes: Conjunctivae are normal. PERRL. EOMI. Head: Atraumatic. Nose: No congestion/rhinnorhea. Mouth/Throat: Mucous membranes are moist.  Oropharynx non-erythematous. Neck: No stridor.   Cardiovascular: Normal rate, regular rhythm. Grossly normal heart sounds.  Good peripheral circulation. Respiratory: Normal respiratory effort.  No retractions. Lungs CTAB. Gastrointestinal: Soft and nontender. No distention. No abdominal bruits. No CVA tenderness. Musculoskeletal: No lower extremity tenderness nor edema.  No joint effusions. Neurologic:  Normal speech and language. No gross focal neurologic deficits are appreciated. No gait instability. Skin:  Skin is warm, dry and intact. No rash noted. Psychiatric: Mood and affect are flat. Speech and behavior are normal.  ____________________________________________   LABS (all labs ordered are listed, but only abnormal results are displayed)  Labs Reviewed  COMPREHENSIVE METABOLIC PANEL - Abnormal; Notable for the following components:      Result Value   Calcium 8.6 (*)    ALT 13 (*)    All other components within normal limits  ACETAMINOPHEN LEVEL - Abnormal; Notable for the following components:   Acetaminophen (Tylenol), Serum <10 (*)    All other components within normal limits  CBC - Abnormal; Notable for the following components:   WBC 3.5 (*)    RBC 4.34 (*)    HCT 39.7 (*)    Platelets 141 (*)    All other components within normal limits  ETHANOL  SALICYLATE LEVEL  URINE DRUG SCREEN, QUALITATIVE ([facility] ONLY)   ____________________________________________  EKG  None ____________________________________________  RADIOLOGY  ED MD interpretation: None  Official radiology report(s): No results found.  ____________________________________________   PROCEDURES  Procedure(s) performed:  None  Procedures  Critical Care performed: No  ____________________________________________   INITIAL IMPRESSION / ASSESSMENT AND PLAN / ED COURSE  As part of my medical decision making, I reviewed the following data within the electronic MEDICAL RECORD NUMBER Nursing notes reviewed and incorporated, Labs reviewed, Old chart reviewed, A consult was requested and obtained from this/these consultant(s) Psychiatry and Notes from prior ED visits   37 year old male with paranoid schizophrenia who presents voluntarily to the ED for auditory hallucinations. Laboratory results unremarkable.  Patient will remain in the ED under voluntary status [facility].  Disposition per psychiatry.      ____________________________________________   FINAL CLINICAL IMPRESSION(S) / ED DIAGNOSES  Final diagnoses:  Paranoid schizophrenia [facility](HCC)     ED Discharge Orders    None       Note:  This document was prepared using Dragon voice recognition software and may include unintentional dictation errors.    Irean HongSung, Aliece Honold J, MD 11/16/17 [phone]

## 2017-11-16 NOTE — ED Notes (Signed)

## 2017-11-16 NOTE — ED Notes (Signed)
Report to include Situation, Background, Assessment, and Recommendations received from Amy B. RN. Patient alert and oriented, warm and dry, in no acute distress. Patient denies SI, HI, AVH and pain. Patient made aware of Q15 minute rounds and security cameras for their safety. Patient instructed to come to me with needs or concerns. 

## 2017-11-16 NOTE — ED Notes (Signed)
Snack and beverage given. 

## 2017-11-16 NOTE — ED Notes (Signed)
BEHAVIORAL HEALTH ROUNDING  Patient sleeping: No.  Patient alert and oriented: yes  Behavior appropriate: Yes. ; If no, describe:  Nutrition and fluids offered: Yes  Toileting and hygiene offered: Yes  Sitter present: not applicable, Q 15 min safety rounds and observation.  Law enforcement present: Yes ODS  

## 2017-11-16 NOTE — BH Assessment (Signed)
Writer called and left a HIPPA Compliant message with [employer] [place] Guardian French Ana(Tracy Boubacar-[phone]), requesting a return phone call. Guardian voicemail states their is a weekend on call number but the number wasn't giving. Per Group Home, they do not have the on call number.

## 2017-11-16 NOTE — ED Notes (Signed)
Pt reports [facility]. Pt reports since going to a new group home he has been "talking to myself". Pt calm and cooperative at this time. Pt denies wanting to harm himself or anyone else.

## 2017-11-17 DIAGNOSIS — F200 Paranoid schizophrenia: Secondary | ICD-10-CM

## 2017-11-17 NOTE — ED Notes (Signed)
Group Home called to say they will be arranging pick up/transportation. They will call back when transport arranged.

## 2017-11-17 NOTE — ED Notes (Signed)
Hourly rounding reveals patient sleeping in room. No complaints, stable, in no acute distress. Q15 minute rounds and monitoring via Security Cameras to continue. 

## 2017-11-17 NOTE — ED Notes (Signed)
Patient had breakfast tray offered to him, nurse spoke to him, but he refused to communicate to nurse, will continue to monitor, patient without behavioral issues at this time, camera surveillance in progress for safety.

## 2017-11-17 NOTE — Consult Note (Signed)
[facility] Face-to-Face Psychiatry Consult   Reason for Consult: Consult for this 37 year old man with schizophrenia who asked police to bring him in Referring Physician: Corky Downs Patient Identification: David Davila. MRN:  [id number] Principal Diagnosis: Paranoid schizophrenia ([place]) Diagnosis:   Patient Active Problem List   Diagnosis Date Noted  . TBI (traumatic brain injury) ([place]) [S06.9X9A] 07/10/2016  . Vitamin B12 deficiency [E53.8] 07/10/2016  . Noncompliance with medication regimen [Z91.14] 06/25/2016  . Cocaine use disorder, mild, abuse ([place]) [F14.10] 06/25/2016  . Cannabis use disorder, mild, abuse [F12.10] 06/25/2016  . Paranoid schizophrenia ([place]) [F20.0] 06/24/2016    Total Time spent with patient: 1 hour  Subjective:   [name]. is a 37 y.o. male patient admitted with "I was talking to myself".  HPI: Patient interviewed chart reviewed.  37 year old man with schizophrenia.  He says that he was at his group home and found himself talking to himself.  He knew that he was feeling stressed out so he took a walk down the street and asked a policeman to bring him to the hospital.  He has only been at this group home 4 or 5 days.  Before that he was at [facility] in [place].  Patient has paucity of speech and not much detail but has denied any suicidal or homicidal thoughts.  Denies that he was hearing voices.  Says that he was compliant with his medicine.  Denies that he has been drinking or using any drugs recently.  Tells me this afternoon that he is agreeable to going back home.  Social history: Just recently at this group home.  Patient has had a troubled few years going back and forth between prison and mental hospitals.  Sounds like he has some relatives but they are not very actively involved.  Medical history: No medical problems known outside of the mental health  Substance abuse history: Past history of abuse of alcohol and drugs including cannabis and cocaine.   No drug screen done this time.  Past Psychiatric History: Patient has had many hospitalizations including long hospitalizations at state facilities.  Diagnosis of both schizophrenia and schizoaffective disorder.  Multiple medications have been tried.  Complications have arisen from noncompliance and substance abuse.  Denies any history of actual suicide attempts  Risk to Self: Suicidal Ideation: No Suicidal Intent: No Is patient at risk for suicide?: No Suicidal Plan?: No Access to Means: No What has been your use of drugs/alcohol within the last 12 months?: Reports of none Other Self Harm Risks: Reports of none Triggers for Past Attempts: None known Intentional Self Injurious Behavior: None Risk to Others: Homicidal Ideation: No Thoughts of Harm to Others: No Current Homicidal Intent: No Current Homicidal Plan: No Access to Homicidal Means: No Identified Victim: Reports of none History of harm to others?: No Assessment of Violence: None Noted Violent Behavior Description: Reports of none Does patient have access to weapons?: No Criminal Charges Pending?: No Does patient have a court date: No Prior Inpatient Therapy: Prior Inpatient Therapy: Yes Prior Therapy Dates: Multiple Hospitalizations  Prior Therapy Facilty/Provider(s): Multiple Hospitalizations Reason for Treatment: Depression & Psychosis Prior Outpatient Therapy: Prior Outpatient Therapy: No(Upcoming Appointment w/[facility]) Does patient have an ACCT team?: No Does patient have Intensive In-House Services?  : No Does patient have [facility] services? : No Does patient have P4CC services?: No  Past Medical History:  Past Medical History:  Diagnosis Date  . Acute psychosis ([place])   . Antisocial personality disorder ([place])   .  Cannabis abuse    Past  . Paranoid schizophrenia ([place])   . Schizoaffective disorder, bipolar type ([place])    History reviewed. No pertinent surgical history. Family History: No family history on  file. Family Psychiatric  History: Does not know of any family history Social History:  Social History   Substance and Sexual Activity  Alcohol Use No     Social History   Substance and Sexual Activity  Drug Use Yes  . Types: Cocaine, Marijuana    Social History   Socioeconomic History  . Marital status: Single    Spouse name: Not on file  . Number of children: Not on file  . Years of education: Not on file  . Highest education level: Not on file  Occupational History  . Not on file  Social Needs  . Financial resource strain: Not on file  . Food insecurity:    Worry: Not on file    Inability: Not on file  . Transportation needs:    Medical: Not on file    Non-medical: Not on file  Tobacco Use  . Smoking status: Current Every Day Smoker    Packs/day: 1.00    Types: Cigarettes  . Smokeless tobacco: Never Used  Substance and Sexual Activity  . Alcohol use: No  . Drug use: Yes    Types: Cocaine, Marijuana  . Sexual activity: Not on file  Lifestyle  . Physical activity:    Days per week: Not on file    Minutes per session: Not on file  . Stress: Not on file  Relationships  . Social connections:    Talks on phone: Not on file    Gets together: Not on file    Attends religious service: Not on file    Active member of club or organization: Not on file    Attends meetings of clubs or organizations: Not on file    Relationship status: Not on file  Other Topics Concern  . Not on file  Social History Narrative  . Not on file   Additional Social History:    Allergies:   Allergies  Allergen Reactions  . Zyprexa [Olanzapine] Other (See Comments)    Reaction: unknown    Labs:  Results for orders placed or performed during the hospital encounter of 11/16/17 (from the past 48 hour(s))  Comprehensive metabolic panel     Status: Abnormal   Collection Time: 11/15/17 10:19 PM  Result Value Ref Range   Sodium 139 135 - 145 mmol/L   Potassium 3.8 3.5 - 5.1 mmol/L    Chloride 105 101 - 111 mmol/L   CO2 27 22 - 32 mmol/L   Glucose, Bld 86 65 - 99 mg/dL   BUN 15 6 - 20 mg/dL   Creatinine, Ser 0.97 0.61 - 1.24 mg/dL   Calcium 8.6 (L) 8.9 - 10.3 mg/dL   Total Protein 7.4 6.5 - 8.1 g/dL   Albumin 3.7 3.5 - 5.0 g/dL   AST 21 15 - 41 U/L   ALT 13 (L) 17 - 63 U/L   Alkaline Phosphatase 65 38 - 126 U/L   Total Bilirubin 0.6 0.3 - 1.2 mg/dL   GFR calc non Af Amer >60 >60 mL/min   GFR calc Af Amer >60 >60 mL/min    Comment: (NOTE) The eGFR has been calculated using the CKD EPI equation. This calculation has not been validated in all clinical situations. eGFR's persistently <60 mL/min signify possible Chronic Kidney Disease.    Anion gap  7 5 - 15    Comment: Performed at [facility], [address] [place], [place] [address]  Ethanol     Status: None   Collection Time: 11/15/17 10:19 PM  Result Value Ref Range   Alcohol, Ethyl (B) <10 <10 mg/dL    Comment:        LOWEST DETECTABLE LIMIT FOR SERUM ALCOHOL IS 10 mg/dL FOR MEDICAL PURPOSES ONLY Performed at [facility], [address] [place], [place] [address]   Salicylate level     Status: None   Collection Time: 11/15/17 10:19 PM  Result Value Ref Range   Salicylate Lvl <3.5 2.8 - 30.0 mg/dL    Comment: Performed at [facility], [address] [place], [place] [address]  Acetaminophen level     Status: Abnormal   Collection Time: 11/15/17 10:19 PM  Result Value Ref Range   Acetaminophen (Tylenol), Serum <10 (L) 10 - 30 ug/mL    Comment:        THERAPEUTIC CONCENTRATIONS VARY SIGNIFICANTLY. A RANGE OF 10-30 ug/mL MAY BE AN EFFECTIVE CONCENTRATION FOR MANY PATIENTS. HOWEVER, SOME ARE BEST TREATED AT CONCENTRATIONS OUTSIDE THIS RANGE. ACETAMINOPHEN CONCENTRATIONS >150 ug/mL AT 4 HOURS AFTER INGESTION AND >50 ug/mL AT 12 HOURS AFTER INGESTION ARE OFTEN ASSOCIATED WITH TOXIC REACTIONS. Performed at [facility], [address]  [place], [place] [address]   cbc     Status: Abnormal   Collection Time: 11/15/17 10:19 PM  Result Value Ref Range   WBC 3.5 (L) 3.8 - 10.6 K/uL   RBC 4.34 (L) 4.40 - 5.90 MIL/uL   Hemoglobin 13.2 13.0 - 18.0 g/dL   HCT 39.7 (L) 40.0 - 52.0 %   MCV 91.5 80.0 - 100.0 fL   MCH 30.5 26.0 - 34.0 pg   MCHC 33.3 32.0 - 36.0 g/dL   RDW 13.1 11.5 - 14.5 %   Platelets 141 (L) 150 - 440 K/uL    Comment: Performed at [facility], [address] [place], [place] [address]  Valproic acid level     Status: Abnormal   Collection Time: 11/15/17 10:19 PM  Result Value Ref Range   Valproic Acid Lvl 44 (L) 50.0 - 100.0 ug/mL    Comment: Performed at [facility], [address] [place],  [address]    Current Facility-Administered Medications  Medication Dose Route Frequency Provider Last Rate Last Dose  . divalproex (DEPAKOTE ER) 24 hr tablet 1,000 mg  1,000 mg Oral Daily Delman Kitten, MD   1,000 mg at 11/17/17 1041  . loratadine (CLARITIN) tablet 10 mg  10 mg Oral Daily Delman Kitten, MD   10 mg at 11/17/17 1041  . traZODone (DESYREL) tablet 50 mg  50 mg Oral QHS PRN Delman Kitten, MD       Current Outpatient Medications  Medication Sig Dispense Refill  . divalproex (DEPAKOTE ER) 500 MG 24 hr tablet Take 1,000 mg by mouth daily.    [name] loperamide (IMODIUM) 2 MG capsule Take 2 mg by mouth as needed for diarrhea or loose stools.    [name] loratadine (CLARITIN) 10 MG tablet Take 10 mg by mouth daily.    . paliperidone (INVEGA SUSTENNA) 156 MG/ML SUSP injection Inject 1 mL (156 mg total) into the muscle every 28 (twenty-eight) days. NEXT DOSE DUE August 13, 2016 (Patient taking differently: Inject 156 mg into the muscle every 28 (twenty-eight) days. ) 0.9 mL 0  . traZODone (DESYREL) 50 MG tablet Take 50  mg by mouth at bedtime as needed for sleep.    [name] atorvastatin (LIPITOR) 10 MG tablet Take 1 tablet (10 mg total) by mouth daily at 6 PM. (Patient not taking: Reported on 11/16/2017) 30 tablet 0   . hydrOXYzine (ATARAX/VISTARIL) 25 MG tablet Take 1 tablet (25 mg total) by mouth 3 (three) times daily. (Patient not taking: Reported on 11/16/2017) 30 tablet 0  . lamoTRIgine (LAMICTAL) 100 MG tablet Take 1 tablet (100 mg total) by mouth every evening. (Patient not taking: Reported on 11/16/2017) 30 tablet 0  . lamoTRIgine (LAMICTAL) 25 MG tablet Take 2 tablets (50 mg total) by mouth daily. (Patient not taking: Reported on 11/16/2017) 60 tablet 0  . LORazepam (ATIVAN) 0.5 MG tablet Take 1 tablet (0.5 mg total) by mouth 2 (two) times daily. (Patient not taking: Reported on 11/16/2017) 60 tablet 0  . OLANZapine zydis (ZYPREXA) 5 MG disintegrating tablet Take 1 tablet (5 mg total) by mouth at bedtime. (Patient not taking: Reported on 11/16/2017) 30 tablet 0  . traZODone (DESYREL) 100 MG tablet Take 1 tablet (100 mg total) by mouth at bedtime. (Patient not taking: Reported on 11/16/2017) 30 tablet 0    Musculoskeletal: Strength & Muscle Tone: within normal limits Gait & Station: normal Patient leans: N/A  Psychiatric Specialty Exam: Physical Exam  Nursing note and vitals reviewed. Constitutional: He appears well-developed and well-nourished.  HENT:  Head: Normocephalic and atraumatic.  Eyes: Pupils are equal, round, and reactive to light. Conjunctivae are normal.  Neck: Normal range of motion.  Cardiovascular: Regular rhythm and normal heart sounds.  Respiratory: Effort normal. No respiratory distress.  GI: Soft.  Musculoskeletal: Normal range of motion.  Neurological: He is alert.  Skin: Skin is warm and dry.  Psychiatric: His affect is blunt. His speech is delayed. He is slowed. Thought content is not paranoid. Cognition and memory are impaired. He expresses inappropriate judgment. He expresses no homicidal and no suicidal ideation.    Review of Systems  Constitutional: Negative.   HENT: Negative.   Eyes: Negative.   Respiratory: Negative.   Cardiovascular: Negative.   Gastrointestinal:  Negative.   Musculoskeletal: Negative.   Skin: Negative.   Neurological: Negative.   Psychiatric/Behavioral: Positive for memory loss. Negative for depression, hallucinations, substance abuse and suicidal ideas. The patient is not nervous/anxious and does not have insomnia.     Blood pressure (!) 94/58, pulse 75, temperature 98.4 F (36.9 C), temperature source Oral, resp. rate 18, height _0  (1.778 m), weight 63.5 kg (140 lb), SpO2 99 %.Body mass index is 20.09 kg/m.  General Appearance: Casual  Eye Contact:  Minimal  Speech:  Slow  Volume:  Decreased  Mood:  Dysphoric  Affect:  Constricted  Thought Process:  Goal Directed  Orientation:  Full (Time, Place, and Person)  Thought Content:  Rumination  Suicidal Thoughts:  No  Homicidal Thoughts:  No  Memory:  Immediate;   Fair Recent;   Fair Remote;   Fair  Judgement:  Fair  Insight:  Fair  Psychomotor Activity:  Decreased  Concentration:  Concentration: Fair  Recall:  [employer] of Knowledge:  Fair  Language:  Fair  Akathisia:  No  Handed:  Right  AIMS (if indicated):     Assets:  Desire for Improvement Housing Physical Health Resilience Social Support  ADL's:  Intact  Cognition:  Impaired,  Mild  Sleep:        Treatment Plan Summary: Plan 37 year old man with schizophrenia and has been calm  since coming into the emergency room.  Denies hallucinations.  Does not appear to be obviously responding to internal stimuli.  Denies suicidal or homicidal thought.  Has an appropriate place to live.  Patient counseled to continue medication management and continue avoiding drug abuse.  Case reviewed with ER physician.  Discontinued IV C patient can be discharged back to his follow-up at his group home  Disposition: No evidence of imminent risk to self or others at present.   Patient does not meet criteria for psychiatric inpatient admission. Supportive therapy provided about ongoing stressors. Discussed crisis plan, support  from social network, calling 911, coming to the Emergency Department, and calling Suicide Hotline.  Alethia Berthold, MD 11/17/2017 3:59 PM

## 2017-11-17 NOTE — ED Provider Notes (Signed)
Dr. Toni Amendlapacs lifting IVC and recommends outpatient management.   Merrily Brittleifenbark, Kenaz Olafson, MD 11/17/17 [phone]

## 2017-11-17 NOTE — ED Notes (Signed)
Referral information for Psychiatric Hospitalization faxed to;   [name]. [facility] [phone]),   . [place]  . [facility] [phone]),   . [place] [phone] or [phone])  . [facility] [phone]),    . [facility] [phone]),

## 2017-11-17 NOTE — ED Notes (Signed)
IVC rescinded by Dr Toni Amendlapacs/ To be D/C'd

## 2017-11-17 NOTE — ED Notes (Signed)
Patient ate 100% of lunch and beverage.  

## 2017-11-17 NOTE — Discharge Instructions (Addendum)
You have been seen in the emergency department for a  psychiatric concern. You have been evaluated both medically as well as psychiatrically. Please follow-up with your outpatient resources provided. Return to the emergency department for any worsening symptoms, or any thoughts of hurting yourself or anyone else so that we may attempt to help you. 

## 2017-11-17 NOTE — ED Notes (Signed)
Hourly rounding reveals patient in rest room. No complaints, stable, in no acute distress. Q15 minute rounds and monitoring via Security Cameras to continue. 

## 2017-11-17 NOTE — ED Provider Notes (Signed)
-----------------------------------------   7:14 AM on 11/17/2017 -----------------------------------------   Blood pressure 98/60, pulse 61, temperature 98.8 F (37.1 C), temperature source Oral, resp. rate 16, height 5\' 10"  (1.778 m), weight 63.5 kg (140 lb), SpO2 96 %.  The patient had no acute events since last update.  Calm and cooperative at this time.  Patient meets inpatient criteria, has been referred to a facility and awaiting acceptance.     Rebecka ApleyWebster, Allison P, MD 11/17/17 [phone]

## 2017-11-17 NOTE — ED Notes (Signed)
Nurse called group home and the guardian a couple more times without any answer, will continue to do so regarding discharge for Patient.

## 2017-11-17 NOTE — ED Notes (Signed)
Nurse talked with patient and He denies Si/hi or avh, states that he was lonely and stayed in His room all the time at the group home and became depressed, states that he feels better now and wants to go back to group home and maybe go outside more, patient without any behavioral issues, will continue to monitor.

## 2017-11-17 NOTE — ED Notes (Signed)
Dr. Toni Amendlapacs in talking to Patient now.

## 2017-11-17 NOTE — ED Notes (Signed)
Report to include Situation, Background, Assessment, and Recommendations received from Wendy RN. Patient alert and oriented, warm and dry, in no acute distress. Patient denies SI, HI, AVH and pain. Patient made aware of Q15 minute rounds and security cameras for their safety. Patient instructed to come to me with needs or concerns.  

## 2017-11-17 NOTE — ED Notes (Signed)
Nurse attempted to call Guardian French Ana( Tracy WallBoubacar) [phone] , no answer, will continue to try to call regarding Patients discharge back to group home.

## 2017-11-17 NOTE — ED Notes (Signed)
Hourly rounding reveals patient in room. No complaints, stable, in no acute distress. Q15 minute rounds and monitoring via Security Cameras to continue.Snack and beverage given. 

## 2017-11-17 NOTE — BH Assessment (Signed)
Referral information updated status for Psychiatric Hospitalization; David Davila    [facility] [phone]) Pt denied due to no insurance    [facility] [phone]), Per Agustin Creearlene No open beds    [facility] [phone] or [phone]) Resent fax    [facility] [phone]) Resent fax    [facility] [phone]), left message waiting for response

## 2017-11-18 NOTE — ED Notes (Signed)
Hourly rounding reveals patient sleeping in room. No complaints, stable, in no acute distress. Q15 minute rounds and monitoring via Security Cameras to continue. 

## 2017-11-18 NOTE — ED Notes (Signed)

## 2017-11-18 NOTE — BH Assessment (Signed)
This Clinical research associatewriter called Group Home, [facility] [phone]) to inquire about what time patient will be picked up. Britta MccreedyBarbara stated she will try to pick up patient before 12pm today.

## 2017-11-18 NOTE — ED Notes (Signed)
Pt transferred from BHU to 20 hallway bed  - introduced myself to him - plan of care discussed including his pending discharge back to his group home  NAD assessed  He denies pain  Continue to monitor

## 2017-11-18 NOTE — ED Provider Notes (Signed)
-----------------------------------------   7:34 AM on 11/18/2017 -----------------------------------------   Blood pressure 109/66, pulse 71, temperature 98.6 F (37 C), temperature source Oral, resp. rate 16, height 5\' 10"  (1.778 m), weight 63.5 kg (140 lb), SpO2 100 %.  The patient had no acute events since last update.  Patient has been seen by psychiatry, they believe the patient is safe for discharge home from a psychiatric standpoint and have rescinded his IVC.  According to record review they attempted to reach the guardian yesterday to arrange discharge to his group home without success.  Will attempt again this morning, anticipate likely discharge today.     Minna AntisPaduchowski, Rheannon Cerney, MD 11/18/17 [phone]

## 2017-11-18 NOTE — Progress Notes (Addendum)
Writer attempted to reach [place] from pt's group home [facility] on  [phone]) to inquire about estimated pick up time but voice mail is full, group home staff is not picking up calls. [place] TTS notified, states will call group home after her lunch break.

## 2017-11-18 NOTE — BH Assessment (Signed)
This Clinical research associatewriter called Britta MccreedyBarbara [phone]) and she stated [facility] told her the patient ACTT would be coming to pick him up from [facility].  Writer called ED secretary Misty StanleyLisa and she stated no one spoke with an ACTT in regards to patient pickup.  Writer called Britta MccreedyBarbara [phone]) and informed her no one has spoken to patient ACTT and to please come pick up. Britta MccreedyBarbara presented as if she doesn't want to pick up patient and stated she is 35 minutes away, and would have to call her nephew. Britta MccreedyBarbara asked if she could call TTS back. Provided Britta MccreedyBarbara with number [phone].

## 2017-11-18 NOTE — Progress Notes (Signed)
Writer just spoke to [place] (group home staff) to confirm estimated pickup time. "I will be there, shortly, the ACTT team [facility], I'm on my way, I'll call you back shortly".

## 2017-11-18 NOTE — ED Notes (Signed)
Pt given warm blanket and breakfast tray

## 2017-11-24 ENCOUNTER — Other Ambulatory Visit: Payer: Self-pay

## 2017-11-24 ENCOUNTER — Encounter (HOSPITAL_COMMUNITY): Payer: Self-pay | Admitting: Emergency Medicine

## 2017-11-24 ENCOUNTER — Emergency Department (HOSPITAL_COMMUNITY)
Admission: EM | Admit: 2017-11-24 | Discharge: 2017-11-24 | Discharge status: Against Medical Advice | Payer: Medicare Other | Attending: Emergency Medicine | Admitting: Emergency Medicine

## 2017-11-24 DIAGNOSIS — Z8659 Personal history of other mental and behavioral disorders: Secondary | ICD-10-CM | POA: Diagnosis not present

## 2017-11-24 DIAGNOSIS — F17210 Nicotine dependence, cigarettes, uncomplicated: Secondary | ICD-10-CM | POA: Diagnosis not present

## 2017-11-24 DIAGNOSIS — Z9189 Other specified personal risk factors, not elsewhere classified: Secondary | ICD-10-CM | POA: Diagnosis present

## 2017-11-24 DIAGNOSIS — Z79899 Other long term (current) drug therapy: Secondary | ICD-10-CM | POA: Insufficient documentation

## 2017-11-24 NOTE — ED Provider Notes (Signed)
[facility] EMERGENCY DEPARTMENT Provider Note   CSN: [id number] Arrival date & time: 11/24/17  1523     History   Chief Complaint Chief Complaint  Patient presents with  . V70.1    HPI David Davila. is a 37 y.o. male.  HPI   36yM brought in for evaluation after he left his residence today. Pt says me he needed to get out. He has been at current residence for less than a week. He says he just sits around all day. He wanted to do something like go to [employer]. Says he feels safe there but just doesn't want to go back. He reports he was staying in a shelter prior to going to this residence. He says he would prefer to stay at a shelter instead of his current living situation.  Past Medical History:  Diagnosis Date  . Acute psychosis (HCC)   . Antisocial personality disorder (HCC)   . Cannabis abuse    Past  . Paranoid schizophrenia (HCC)   . Schizoaffective disorder, bipolar type [facility])     Patient Active Problem List   Diagnosis Date Noted  . TBI (traumatic brain injury) (HCC) 07/10/2016  . Vitamin B12 deficiency 07/10/2016  . Noncompliance with medication regimen 06/25/2016  . Cocaine use disorder, mild, abuse (HCC) 06/25/2016  . Cannabis use disorder, mild, abuse 06/25/2016  . Paranoid schizophrenia (HCC) 06/24/2016    History reviewed. No pertinent surgical history.      Home Medications    Prior to Admission medications   Medication Sig Start Date End Date Taking? Authorizing Provider  atorvastatin (LIPITOR) 10 MG tablet Take 1 tablet (10 mg total) by mouth daily at 6 PM. Patient not taking: Reported on 11/16/2017 07/17/16   Adonis Brook, NP  divalproex (DEPAKOTE ER) 500 MG 24 hr tablet Take 1,000 mg by mouth daily.    [provider]  hydrOXYzine (ATARAX/VISTARIL) 25 MG tablet Take 1 tablet (25 mg total) by mouth 3 (three) times daily. Patient not taking: Reported on 11/16/2017 07/17/16   Adonis Brook, NP  lamoTRIgine (LAMICTAL) 100 MG tablet  Take 1 tablet (100 mg total) by mouth every evening. Patient not taking: Reported on 11/16/2017 07/17/16   Adonis Brook, NP  lamoTRIgine (LAMICTAL) 25 MG tablet Take 2 tablets (50 mg total) by mouth daily. Patient not taking: Reported on 11/16/2017 07/18/16   Adonis Brook, NP  loperamide (IMODIUM) 2 MG capsule Take 2 mg by mouth as needed for diarrhea or loose stools.    [provider]  loratadine (CLARITIN) 10 MG tablet Take 10 mg by mouth daily.    [provider]  LORazepam (ATIVAN) 0.5 MG tablet Take 1 tablet (0.5 mg total) by mouth 2 (two) times daily. Patient not taking: Reported on 11/16/2017 07/17/16   Adonis Brook, NP  OLANZapine zydis (ZYPREXA) 5 MG disintegrating tablet Take 1 tablet (5 mg total) by mouth at bedtime. Patient not taking: Reported on 11/16/2017 07/17/16   Adonis Brook, NP  paliperidone (INVEGA SUSTENNA) 156 MG/ML SUSP injection Inject 1 mL (156 mg total) into the muscle every 28 (twenty-eight) days. NEXT DOSE DUE August 13, 2016 Patient taking differently: Inject 156 mg into the muscle every 28 (twenty-eight) days.  08/13/16   Adonis Brook, NP  traZODone (DESYREL) 100 MG tablet Take 1 tablet (100 mg total) by mouth at bedtime. Patient not taking: Reported on 11/16/2017 07/17/16   Adonis Brook, NP  traZODone (DESYREL) 50 MG tablet Take 50 mg by mouth at bedtime  as needed for sleep.    [provider]    Family History History reviewed. No pertinent family history.  Social History Social History   Tobacco Use  . Smoking status: Current Every Day Smoker    Packs/day: 1.00    Types: Cigarettes  . Smokeless tobacco: Never Used  Substance Use Topics  . Alcohol use: No  . Drug use: Yes    Types: Cocaine, Marijuana     Allergies   Zyprexa [olanzapine]   Review of Systems Review of Systems  All systems reviewed and negative, other than as noted in HPI.  Physical Exam Updated Vital Signs BP 107/67 (BP Location: Right Arm)    Pulse 99   Temp 98.6 F (37 C) (Oral)   Resp 16   Ht 5' 9.5" (1.765 m)   Wt 63.5 kg (140 lb)   SpO2 99%   BMI 20.38 kg/m   Physical Exam  Constitutional: He appears well-developed and well-nourished. No distress.  Sitting on stretcher. NAD. Appears reasonably groomed and clothes appears clean.   HENT:  Head: Normocephalic and atraumatic.  Eyes: Conjunctivae are normal. Right eye exhibits no discharge. Left eye exhibits no discharge.  Neck: Neck supple.  Cardiovascular: Normal rate, regular rhythm and normal heart sounds. Exam reveals no gallop and no friction rub.  No murmur heard. Pulmonary/Chest: Effort normal and breath sounds normal. No respiratory distress.  Abdominal: Soft. He exhibits no distension. There is no tenderness.  Musculoskeletal: He exhibits no edema or tenderness.  Neurological: He is alert.  Skin: Skin is warm and dry.  Psychiatric:  Calm. Cooperative. Flat affect. Answering questions appropriately. Doesn't appear to be responding to internal stimuli.   Nursing note and vitals reviewed.    ED Treatments / Results  Labs (all labs ordered are listed, but only abnormal results are displayed) Labs Reviewed - No data to display  EKG None  Radiology No results found.  Procedures Procedures (including critical care time)  Medications Ordered in ED Medications - No data to display   Initial Impression / Assessment and Plan / ED Course  I have reviewed the triage vital signs and the nursing notes.  Pertinent labs & imaging results that were available during my care of the patient were reviewed by me and considered in my medical decision making (see chart for details).    36yM who eloped from group home. He is calm/cooperative. Denies thoughts of self harm or anyone else. Doesn't seem to be acutely psychotic. There doesn't seem to be emergent medical or psychiatric condition currently, but he needs a safe disposition.  Unfortunately, he subsequently  eloped from the ED shortly after I spoke with him.  David Davila is legal guardian [phone]. I spoke with briefly a few times on the phone. She requested I notify the police. Spoke with officer in the ED. Basically told that unless he is IVC'd then they wouldn't bring him back to the ED if he refused. I don't feel great about him eloping.  He seems to lack some insight but my impression was that there was no immediate threat to himself or anyone else as a basis to IVC him. David Davila is aware that he left his residence, was in the ED here, that he eloped from the ED and that unless IVC'd that law enforcement wouldn't bring him back to the ER if he refused.    I spoke with on-call social worker. She advised that at this point it would be up to legal  guardian to file a missing person report. I spoke with David Davila again and advised to file a missing person report with police.   Final Clinical Impressions(s) / ED Diagnoses   Final diagnoses:  At risk for elopement from healthcare setting  History of schizophrenia    ED Discharge Orders    None       Raeford RazorKohut, Mindi Akerson, MD 11/24/17 [phone]

## 2017-11-24 NOTE — ED Triage Notes (Signed)
Pt brought in via [facility] due to he walked away from assisted living home. Pt doesn't want to be there. Pt denies SI/HI. Pt states has only been at this residence for 5 days. [facility] [place] and group home/assisted living place [place] in [place].

## 2017-11-24 NOTE — ED Notes (Signed)
Legal guardian [phone] Mrs. Bubucar  Pt [facility] at [facility] [phone]

## 2017-11-24 NOTE — ED Notes (Signed)
Pt walked out of ED.  States he is going to find a shelter to stay at.  Attempted to notify pt's legal guardian.  Left a message.

## 2017-11-25 ENCOUNTER — Emergency Department (HOSPITAL_COMMUNITY)
Admission: EM | Admit: 2017-11-25 | Discharge: 2017-11-25 | Disposition: A | Discharge status: Home / Self Care | Payer: Medicare Other | Attending: Emergency Medicine | Admitting: Emergency Medicine

## 2017-11-25 ENCOUNTER — Other Ambulatory Visit: Payer: Self-pay

## 2017-11-25 ENCOUNTER — Encounter (HOSPITAL_COMMUNITY): Payer: Self-pay | Admitting: *Deleted

## 2017-11-25 DIAGNOSIS — F200 Paranoid schizophrenia: Secondary | ICD-10-CM | POA: Insufficient documentation

## 2017-11-25 DIAGNOSIS — R443 Hallucinations, unspecified: Secondary | ICD-10-CM

## 2017-11-25 DIAGNOSIS — Z79899 Other long term (current) drug therapy: Secondary | ICD-10-CM | POA: Insufficient documentation

## 2017-11-25 DIAGNOSIS — R440 Auditory hallucinations: Secondary | ICD-10-CM | POA: Diagnosis present

## 2017-11-25 DIAGNOSIS — F17210 Nicotine dependence, cigarettes, uncomplicated: Secondary | ICD-10-CM | POA: Insufficient documentation

## 2017-11-25 DIAGNOSIS — Z593 Problems related to living in residential institution: Secondary | ICD-10-CM | POA: Insufficient documentation

## 2017-11-25 LAB — CBC WITH DIFFERENTIAL/PLATELET
BASOS PCT: 0 %
Basophils Absolute: 0 10*3/uL (ref 0.0–0.1)
EOS ABS: 0 10*3/uL (ref 0.0–0.7)
Eosinophils Relative: 1 %
HCT: 38.7 % — ABNORMAL LOW (ref 39.0–52.0)
HEMOGLOBIN: 12.8 g/dL — AB (ref 13.0–17.0)
Lymphocytes Relative: 25 %
Lymphs Abs: 1.1 10*3/uL (ref 0.7–4.0)
MCH: 30.3 pg (ref 26.0–34.0)
MCHC: 33.1 g/dL (ref 30.0–36.0)
MCV: 91.5 fL (ref 78.0–100.0)
Monocytes Absolute: 0.5 10*3/uL (ref 0.1–1.0)
Monocytes Relative: 11 %
NEUTROS PCT: 64 %
Neutro Abs: 2.8 10*3/uL (ref 1.7–7.7)
Platelets: 144 10*3/uL — ABNORMAL LOW (ref 150–400)
RBC: 4.23 MIL/uL (ref 4.22–5.81)
RDW: 13.4 % (ref 11.5–15.5)
WBC: 4.4 10*3/uL (ref 4.0–10.5)

## 2017-11-25 LAB — ETHANOL

## 2017-11-25 LAB — BASIC METABOLIC PANEL
Anion gap: 11 (ref 5–15)
BUN: 14 mg/dL (ref 6–20)
CHLORIDE: 99 mmol/L — AB (ref 101–111)
CO2: 29 mmol/L (ref 22–32)
Calcium: 9.2 mg/dL (ref 8.9–10.3)
Creatinine, Ser: 1.01 mg/dL (ref 0.61–1.24)
Glucose, Bld: 129 mg/dL — ABNORMAL HIGH (ref 65–99)
POTASSIUM: 4 mmol/L (ref 3.5–5.1)
SODIUM: 139 mmol/L (ref 135–145)

## 2017-11-25 NOTE — ED Notes (Signed)
Patient is resting comfortably. 

## 2017-11-25 NOTE — ED Notes (Signed)
Pt wanded by security. 

## 2017-11-25 NOTE — ED Notes (Addendum)
Pt stated that he was going to smoke and he left. He was not SI/HI. Dr Deretha EmoryZackowski notified that he left. Pt AOx3 at the time of him leaving

## 2017-11-25 NOTE — ED Triage Notes (Signed)
Pt states he is hearing voices in his head; pt states the voice is his voice; pt denies any si/hi

## 2017-11-25 NOTE — ED Notes (Signed)
Peanut butter crackers and soda provided

## 2017-11-25 NOTE — BH Assessment (Signed)
Tele Assessment Note   Patient Name: David Davila. MRN: [id number] Referring Physician: [place] Location of Patient: [facility] Location of Provider: Behavioral Health TTS Department  David Davila. is an 37 y.o. male who has presented in the ED on two occasions in the past two days with the same complaint.  Patient was released from [facility] earlier this month and was placed in a group home in [place].  Patient has not been happy with his placement because he states that "It is out in the woods and in the middle of nowhere and I can't even go to [employer]."  Patient states that he was living in [place] in a shelter prior to his group home placement and he states that he was happier there because there were things for him to do. Patient states that he is not suicidal or homicidal.  He states that he is having conversations with himself, but he is not having any command hallucinations. When asked if he feels like he needs to be in a hospital for mental health issues, he states that he does not.  He states that he would just like to go to another group home.  Patient states that he has a history of drug use, marijuana and cocaine, but states that he has been clean for the past six months. Prior to this, he states that he was smoking twenty dollars worth of cocaine daily and he states that he was smoking a blunt of marijuana daily.  Diagnosis: F20.9  Past Medical History:  Past Medical History:  Diagnosis Date  . Acute psychosis (HCC)   . Antisocial personality disorder (HCC)   . Cannabis abuse    Past  . Paranoid schizophrenia (HCC)   . Schizoaffective disorder, bipolar type (HCC)     History reviewed. No pertinent surgical history.  Family History: History reviewed. No pertinent family history.  Social History:  reports that he has been smoking cigarettes.  He has been smoking about 1.00 pack per day. He has never used smokeless tobacco. He reports that he has current or past  drug history. Drugs: Cocaine and Marijuana. He reports that he does not drink alcohol.  Additional Social History:  Alcohol / Drug Use Pain Medications: denies Prescriptions: denies Over the Counter: denies History of alcohol / drug use?: Yes Longest period of sobriety (when/how long): Patient states that he has been clean for the past six months Substance #1 Name of Substance 1: cocaine 1 - Age of First Use: 17 1 - Amount (size/oz): $20 1 - Frequency: daily 1 - Duration: since onset 1 - Last Use / Amount: 6 mos ago Substance #2 Name of Substance 2: Marijuana 2 - Age of First Use: 17 2 - Amount (size/oz): 1 blunt 2 - Frequency: daily 2 - Duration: since onset 2 - Last Use / Amount: 6 mos ago  CIWA: CIWA-Ar BP: (!) 93/59 Pulse Rate: 82 COWS:    Allergies:  Allergies  Allergen Reactions  . Zyprexa [Olanzapine] Other (See Comments)    Reaction: unknown    Home Medications:  (Not in a hospital admission)  OB/GYN Status:  No LMP for male patient.  General Assessment Data Location of Assessment: [facility] ED TTS Assessment: In system Is this a Tele or Face-to-Face Assessment?: Face-to-Face Is this an Initial Assessment or a Re-assessment for this encounter?: Initial Assessment Marital status: Single Living Arrangements: Group Home Can pt return to current living arrangement?: Yes Admission Status: Voluntary Is patient capable of signing  voluntary admission?: Yes Referral Source: Self/Family/Friend Insurance type: (Medicare)     Crisis Care Plan Living Arrangements: Group Home Legal Guardian: Other:(Ms Bubucar) Name of Psychiatrist: (new to area, not sure) Name of Therapist: (none)  Education Status Is patient currently in school?: No Is the patient employed, unemployed or receiving disability?: Unemployed, Receiving disability income  Risk to self with the past 6 months Suicidal Ideation: No Has patient been a risk to self within the past 6 months prior to  admission? : No Suicidal Intent: No-Not Currently/Within Last 6 Months Has patient had any suicidal intent within the past 6 months prior to admission? : No Is patient at risk for suicide?: No, but patient needs Medical Clearance Suicidal Plan?: No-Not Currently/Within Last 6 Months Has patient had any suicidal plan within the past 6 months prior to admission? : No Access to Means: No What has been your use of drugs/alcohol within the last 12 months?: none Previous Attempts/Gestures: No How many times?: 0 Other Self Harm Risks: none Triggers for Past Attempts: None known Intentional Self Injurious Behavior: None Family Suicide History: (unknown) Recent stressful life event(s): Conflict (Comment)(unhappy with group home placement) Persecutory voices/beliefs?: No Depression: No Substance abuse history and/or treatment for substance abuse?: No Suicide prevention information given to non-admitted patients: Not applicable  Risk to Others within the past 6 months Homicidal Ideation: No Does patient have any lifetime risk of violence toward others beyond the six months prior to admission? : No Thoughts of Harm to Others: No Current Homicidal Intent: No Current Homicidal Plan: No Access to Homicidal Means: No Identified Victim: (none) History of harm to others?: No Assessment of Violence: None Noted Violent Behavior Description: (none) Does patient have access to weapons?: No Criminal Charges Pending?: No Does patient have a court date: No Is patient on probation?: No  Psychosis Hallucinations: Auditory(not command in nature, just has conversations with himself)  Mental Status Report Appearance/Hygiene: Unremarkable Eye Contact: Good Motor Activity: Freedom of movement Speech: Unremarkable Level of Consciousness: Alert Mood: Pleasant Affect: Flat Thought Processes: Coherent Judgement: Unimpaired Orientation: Person, Place, Time, Situation Obsessive Compulsive  Thoughts/Behaviors: None  Cognitive Functioning Concentration: Normal Memory: Recent Intact, Remote Intact Is patient IDD: No Is patient DD?: No Insight: Fair Impulse Control: Fair Appetite: Good Have you had any weight changes? : No Change Sleep: No Change Total Hours of Sleep: 8 Vegetative Symptoms: None  ADLScreening [facility] Assessment Services) Patient's cognitive ability adequate to safely complete daily activities?: Yes Patient able to express need for assistance with ADLs?: Yes Independently performs ADLs?: Yes (appropriate for developmental age)  Prior Inpatient Therapy Prior Inpatient Therapy: Yes Prior Therapy Dates: ([facility] Prior Therapy Facilty/Provider(s): [facility]([place]) Reason for Treatment: (schizophrenia)  Prior Outpatient Therapy Prior Outpatient Therapy: Yes(patient has been to [facility] in the past) Prior Therapy Dates: (within the past six months) Prior Therapy Facilty/Provider(s): ([facility]) Reason for Treatment: (schizophrenia) Does patient have an ACCT team?: No Does patient have Intensive In-House Services?  : No Does patient have [facility] services? : No Does patient have P4CC services?: No  ADL Screening (condition at time of admission) Patient's cognitive ability adequate to safely complete daily activities?: Yes Is the patient deaf or have difficulty hearing?: No Does the patient have difficulty seeing, even when wearing glasses/contacts?: No Does the patient have difficulty concentrating, remembering, or making decisions?: No Patient able to express need for assistance with ADLs?: Yes Does the patient have difficulty dressing or bathing?: No Independently performs ADLs?: Yes (appropriate  for developmental age) Does the patient have difficulty walking or climbing stairs?: No Weakness of Legs: None Weakness of Arms/Hands: None       Abuse/Neglect Assessment (Assessment to be complete while patient is alone) Abuse/Neglect Assessment Can  Be Completed: Yes Physical Abuse: Denies Verbal Abuse: Denies Sexual Abuse: Denies Exploitation of patient/patient's resources: Denies Self-Neglect: Denies Values / Beliefs Cultural Requests During Hospitalization: None Spiritual Requests During Hospitalization: None Consults Spiritual Care Consult Needed: No Social Work Consult Needed: No Merchant navy officer (For Healthcare) Does Patient Have a Medical Advance Directive?: No Would patient like information on creating a medical advance directive?: No - Patient declined    Additional Information 1:1 In Past 12 Months?: No CIRT Risk: No Elopement Risk: No Does patient have medical clearance?: No     Disposition: Per Malachy Chamber, FNP, Patient is psychiatrically cleared to be D/C back to group home. Disposition Initial Assessment Completed for this Encounter: Yes Disposition of Patient: (Pending Review with Psychiatry)  This service was provided via telemedicine using a 2-way, interactive audio and video technology.  Names of all persons participating in this telemedicine service and their role in this encounter. Name: Seabron Iannello Role: patient  Name:Tangela Dolliver Role: TTS  Name: Role:   Name:  Role:     Daphene Calamity 11/25/2017 9:32 AM

## 2017-11-25 NOTE — ED Notes (Signed)
[facility]  Was told they are recommending discharge anyway.

## 2017-11-25 NOTE — ED Notes (Signed)
Pt states he has been walking outside ever since his discharge yesterday. Has not slept either. He did not go back to group home and does not want to go back. Says "the only thing to do is go in and outside.its in the middle of no where. [facility] [employer] and that he would like to stay else where.

## 2017-11-25 NOTE — ED Provider Notes (Signed)
[facility] EMERGENCY DEPARTMENT Provider Note   CSN: [id number] Arrival date & time: 11/25/17  0532     History   Chief Complaint Chief Complaint  Patient presents with  . Hallucinations    HPI David Davila. is a 37 y.o. male.  HPI  This is a 37 year old male with history of paranoid schizophrenia who presents with auditory hallucinations.  Patient reports increased auditory hallucinations and "talking to myself."  He reports that he has these "sometimes."  Denies SI or HI.  He will not tell me what the voices are telling him.  He lives at a group home but does not wish to go back.  When asked if he came because of his hallucinations are because he does not like his group home, he states "both."  Patient was seen and evaluated yesterday complaining of his group home.  He reports that he has been walking and wandering by himself.  He denies any alcohol or drug use.  Past Medical History:  Diagnosis Date  . Acute psychosis (HCC)   . Antisocial personality disorder (HCC)   . Cannabis abuse    Past  . Paranoid schizophrenia (HCC)   . Schizoaffective disorder, bipolar type [facility])     Patient Active Problem List   Diagnosis Date Noted  . TBI (traumatic brain injury) (HCC) 07/10/2016  . Vitamin B12 deficiency 07/10/2016  . Noncompliance with medication regimen 06/25/2016  . Cocaine use disorder, mild, abuse (HCC) 06/25/2016  . Cannabis use disorder, mild, abuse 06/25/2016  . Paranoid schizophrenia (HCC) 06/24/2016    History reviewed. No pertinent surgical history.      Home Medications    Prior to Admission medications   Medication Sig Start Date End Date Taking? Authorizing Provider  atorvastatin (LIPITOR) 10 MG tablet Take 1 tablet (10 mg total) by mouth daily at 6 PM. Patient not taking: Reported on 11/16/2017 07/17/16   Adonis Brook, NP  divalproex (DEPAKOTE ER) 500 MG 24 hr tablet Take 1,000 mg by mouth daily.    [provider]  hydrOXYzine  (ATARAX/VISTARIL) 25 MG tablet Take 1 tablet (25 mg total) by mouth 3 (three) times daily. Patient not taking: Reported on 11/16/2017 07/17/16   Adonis Brook, NP  lamoTRIgine (LAMICTAL) 100 MG tablet Take 1 tablet (100 mg total) by mouth every evening. Patient not taking: Reported on 11/16/2017 07/17/16   Adonis Brook, NP  lamoTRIgine (LAMICTAL) 25 MG tablet Take 2 tablets (50 mg total) by mouth daily. Patient not taking: Reported on 11/16/2017 07/18/16   Adonis Brook, NP  loperamide (IMODIUM) 2 MG capsule Take 2 mg by mouth as needed for diarrhea or loose stools.    [provider]  loratadine (CLARITIN) 10 MG tablet Take 10 mg by mouth daily.    [provider]  LORazepam (ATIVAN) 0.5 MG tablet Take 1 tablet (0.5 mg total) by mouth 2 (two) times daily. Patient not taking: Reported on 11/16/2017 07/17/16   Adonis Brook, NP  OLANZapine zydis (ZYPREXA) 5 MG disintegrating tablet Take 1 tablet (5 mg total) by mouth at bedtime. Patient not taking: Reported on 11/16/2017 07/17/16   Adonis Brook, NP  paliperidone (INVEGA SUSTENNA) 156 MG/ML SUSP injection Inject 1 mL (156 mg total) into the muscle every 28 (twenty-eight) days. NEXT DOSE DUE August 13, 2016 Patient taking differently: Inject 156 mg into the muscle every 28 (twenty-eight) days.  08/13/16   Adonis Brook, NP  traZODone (DESYREL) 100 MG tablet Take 1 tablet (100 mg total) by mouth  at bedtime. Patient not taking: Reported on 11/16/2017 07/17/16   Adonis BrookAgustin, Sheila, NP  traZODone (DESYREL) 50 MG tablet Take 50 mg by mouth at bedtime as needed for sleep.    [provider]    Family History History reviewed. No pertinent family history.  Social History Social History   Tobacco Use  . Smoking status: Current Every Day Smoker    Packs/day: 1.00    Types: Cigarettes  . Smokeless tobacco: Never Used  Substance Use Topics  . Alcohol use: No  . Drug use: Yes    Types: Cocaine, Marijuana     Allergies     Zyprexa [olanzapine]   Review of Systems Review of Systems  Respiratory: Negative for shortness of breath.   Cardiovascular: Negative for chest pain.  Psychiatric/Behavioral: Positive for hallucinations and sleep disturbance. Negative for suicidal ideas.  All other systems reviewed and are negative.    Physical Exam Updated Vital Signs BP (!) 93/59   Pulse 82   Temp 97.8 F (36.6 C) (Oral)   Ht 5' 9.5" (1.765 m)   Wt 63.5 kg (140 lb)   SpO2 100%   BMI 20.38 kg/m   Physical Exam  Constitutional: He is oriented to person, place, and time. No distress.  HENT:  Head: Normocephalic and atraumatic.  Cardiovascular: Normal rate and regular rhythm.  Pulmonary/Chest: Effort normal. No respiratory distress.  Neurological: He is alert and oriented to person, place, and time.  Skin: Skin is warm and dry.  Psychiatric: He has a normal mood and affect.  Occasionally stares off into space, otherwise does not appear to be responding to internal stimuli  Nursing note and vitals reviewed.    ED Treatments / Results  Labs (all labs ordered are listed, but only abnormal results are displayed) Labs Reviewed  CBC WITH DIFFERENTIAL/PLATELET  BASIC METABOLIC PANEL  ETHANOL  RAPID URINE DRUG SCREEN, HOSP PERFORMED    EKG None  Radiology No results found.  Procedures Procedures (including critical care time)  Medications Ordered in ED Medications - No data to display   Initial Impression / Assessment and Plan / ED Course  I have reviewed the triage vital signs and the nursing notes.  Pertinent labs & imaging results that were available during my care of the patient were reviewed by me and considered in my medical decision making (see chart for details).     Presents with reported hallucinations.  Also displeasure with his current living situation.  He does not appear acutely psychotic denies SI or HI.  Suspect he is just unhappy with his current living situation; however,  will have TTS evaluate to clear for social work and/or case management if appropriate.   Final Clinical Impressions(s) / ED Diagnoses   Final diagnoses:  Hallucinations    ED Discharge Orders    None       Klarisa Barman, Mayer Maskerourtney F, MD 11/25/17 [phone]

## 2017-12-06 ENCOUNTER — Encounter: Payer: Self-pay | Admitting: Emergency Medicine

## 2017-12-06 ENCOUNTER — Emergency Department
Admission: EM | Admit: 2017-12-06 | Discharge: 2017-12-07 | Disposition: A | Discharge status: Home / Self Care | Payer: Medicare Other | Attending: Emergency Medicine | Admitting: Emergency Medicine

## 2017-12-06 ENCOUNTER — Other Ambulatory Visit: Payer: Self-pay

## 2017-12-06 DIAGNOSIS — F209 Schizophrenia, unspecified: Secondary | ICD-10-CM

## 2017-12-06 DIAGNOSIS — F17210 Nicotine dependence, cigarettes, uncomplicated: Secondary | ICD-10-CM | POA: Insufficient documentation

## 2017-12-06 DIAGNOSIS — Z79899 Other long term (current) drug therapy: Secondary | ICD-10-CM | POA: Diagnosis not present

## 2017-12-06 DIAGNOSIS — F200 Paranoid schizophrenia: Secondary | ICD-10-CM | POA: Insufficient documentation

## 2017-12-06 DIAGNOSIS — Z9183 Wandering in diseases classified elsewhere: Secondary | ICD-10-CM | POA: Insufficient documentation

## 2017-12-06 DIAGNOSIS — F22 Delusional disorders: Secondary | ICD-10-CM | POA: Diagnosis present

## 2017-12-06 LAB — COMPREHENSIVE METABOLIC PANEL
ALT: 15 U/L — AB (ref 17–63)
AST: 22 U/L (ref 15–41)
Albumin: 3.5 g/dL (ref 3.5–5.0)
Alkaline Phosphatase: 53 U/L (ref 38–126)
Anion gap: 6 (ref 5–15)
BUN: 10 mg/dL (ref 6–20)
CHLORIDE: 105 mmol/L (ref 101–111)
CO2: 28 mmol/L (ref 22–32)
CREATININE: 0.93 mg/dL (ref 0.61–1.24)
Calcium: 8.8 mg/dL — ABNORMAL LOW (ref 8.9–10.3)
GFR calc Af Amer: >60 mL/min (ref >60)
GLUCOSE: 90 mg/dL (ref 65–99)
Potassium: 3.6 mmol/L (ref 3.5–5.1)
Sodium: 139 mmol/L (ref 135–145)
Total Bilirubin: 0.3 mg/dL (ref 0.3–1.2)
Total Protein: 7.1 g/dL (ref 6.5–8.1)

## 2017-12-06 LAB — URINE DRUG SCREEN, QUALITATIVE (ARMC ONLY)
Amphetamines, Ur Screen: NOT DETECTED
Barbiturates, Ur Screen: NOT DETECTED
Benzodiazepine, Ur Scrn: NOT DETECTED
CANNABINOID 50 NG, UR ~~LOC~~: NOT DETECTED
COCAINE METABOLITE, UR ~~LOC~~: POSITIVE — AB
MDMA (ECSTASY) UR SCREEN: NOT DETECTED
Methadone Scn, Ur: NOT DETECTED
OPIATE, UR SCREEN: NOT DETECTED
PHENCYCLIDINE (PCP) UR S: NOT DETECTED
Tricyclic, Ur Screen: NOT DETECTED

## 2017-12-06 LAB — ACETAMINOPHEN LEVEL: Acetaminophen (Tylenol), Serum: <10 ug/mL — ABNORMAL LOW (ref 10–30)

## 2017-12-06 LAB — CBC
HEMATOCRIT: 35.6 % — AB (ref 40.0–52.0)
HEMOGLOBIN: 12.2 g/dL — AB (ref 13.0–18.0)
MCH: 30.8 pg (ref 26.0–34.0)
MCHC: 34.2 g/dL (ref 32.0–36.0)
MCV: 90 fL (ref 80.0–100.0)
PLATELETS: 187 10*3/uL (ref 150–440)
RBC: 3.95 MIL/uL — AB (ref 4.40–5.90)
RDW: 13.6 % (ref 11.5–14.5)
WBC: 2.8 10*3/uL — ABNORMAL LOW (ref 3.8–10.6)

## 2017-12-06 LAB — SALICYLATE LEVEL: Salicylate Lvl: <7 mg/dL (ref 2.8–30.0)

## 2017-12-06 LAB — ETHANOL

## 2017-12-06 NOTE — ED Provider Notes (Signed)
[facility] Emergency Department Provider Note   ____________________________________________   First MD Initiated Contact with Patient 12/06/17 2210     (approximate)  I have reviewed the triage vital signs and the nursing notes.   HISTORY  Chief Complaint Psychiatric Evaluation    HPI David Stan. is a 37 y.o. male history of psychosis paranoia and schizophrenia  Patient presents today under IVC for wandering from his group home.  Reportedly has increased paranoia and talking to himself at times.  He is evidently wandered from his group home a few times already this month.  Was sent for further evaluation.  Patient reports that he just cannot stay at the group home.  He just does not like it very well but cannot specify why.  Denies any recent illness.  He was in the hospital recently about 2 weeks ago for similar with talking to himself.  Denies any injuries.  He denies alcohol or drug use.  No fevers or chills.  Known history of schizophrenia and he reports he is taking his medicines.  Past Medical History:  Diagnosis Date  . Acute psychosis (HCC)   . Antisocial personality disorder (HCC)   . Cannabis abuse    Past  . Paranoid schizophrenia (HCC)   . Schizoaffective disorder, bipolar type [facility])     Patient Active Problem List   Diagnosis Date Noted  . TBI (traumatic brain injury) (HCC) 07/10/2016  . Vitamin B12 deficiency 07/10/2016  . Noncompliance with medication regimen 06/25/2016  . Cocaine use disorder, mild, abuse (HCC) 06/25/2016  . Cannabis use disorder, mild, abuse 06/25/2016  . Paranoid schizophrenia (HCC) 06/24/2016    History reviewed. No pertinent surgical history.  Prior to Admission medications   Medication Sig Start Date End Date Taking? Authorizing Provider  atorvastatin (LIPITOR) 10 MG tablet Take 1 tablet (10 mg total) by mouth daily at 6 PM. Patient not taking: Reported on 11/16/2017 07/17/16   Adonis Brook, NP   divalproex (DEPAKOTE ER) 500 MG 24 hr tablet Take 1,000 mg by mouth daily.    [provider]  hydrOXYzine (ATARAX/VISTARIL) 25 MG tablet Take 1 tablet (25 mg total) by mouth 3 (three) times daily. Patient not taking: Reported on 11/16/2017 07/17/16   Adonis Brook, NP  lamoTRIgine (LAMICTAL) 100 MG tablet Take 1 tablet (100 mg total) by mouth every evening. Patient not taking: Reported on 11/16/2017 07/17/16   Adonis Brook, NP  lamoTRIgine (LAMICTAL) 25 MG tablet Take 2 tablets (50 mg total) by mouth daily. Patient not taking: Reported on 11/16/2017 07/18/16   Adonis Brook, NP  loperamide (IMODIUM) 2 MG capsule Take 2 mg by mouth as needed for diarrhea or loose stools.    [provider]  loratadine (CLARITIN) 10 MG tablet Take 10 mg by mouth daily.    [provider]  LORazepam (ATIVAN) 0.5 MG tablet Take 1 tablet (0.5 mg total) by mouth 2 (two) times daily. Patient not taking: Reported on 11/16/2017 07/17/16   Adonis Brook, NP  OLANZapine zydis (ZYPREXA) 5 MG disintegrating tablet Take 1 tablet (5 mg total) by mouth at bedtime. Patient not taking: Reported on 11/16/2017 07/17/16   Adonis Brook, NP  paliperidone (INVEGA SUSTENNA) 156 MG/ML SUSP injection Inject 1 mL (156 mg total) into the muscle every 28 (twenty-eight) days. NEXT DOSE DUE August 13, 2016 Patient taking differently: Inject 156 mg into the muscle every 28 (twenty-eight) days.  08/13/16   Adonis Brook, NP  traZODone (DESYREL) 100 MG tablet  Take 1 tablet (100 mg total) by mouth at bedtime. Patient not taking: Reported on 11/16/2017 07/17/16   Adonis Brook, NP  traZODone (DESYREL) 50 MG tablet Take 50 mg by mouth at bedtime as needed for sleep.    [provider]    Allergies Zyprexa [olanzapine]  History reviewed. No pertinent family history.  Social History Social History   Tobacco Use  . Smoking status: Current Every Day Smoker    Packs/day: 1.00    Types: Cigarettes  . Smokeless  tobacco: Never Used  Substance Use Topics  . Alcohol use: No  . Drug use: Yes    Types: Cocaine, Marijuana    Review of Systems Constitutional: No fever Eyes: No visual changes. ENT: No sore throat. Cardiovascular: Denies chest pain. Respiratory: Denies shortness of breath. Gastrointestinal: No abdominal pain. Genitourinary: Negative for dysuria. Musculoskeletal: Negative for back pain. Skin: Negative for rash. Neurological: Negative for headaches.    ____________________________________________   PHYSICAL EXAM:  VITAL SIGNS: ED Triage Vitals  Enc Vitals Group     BP 12/06/17 2054 122/70     Pulse Rate 12/06/17 2054 81     Resp 12/06/17 2054 16     Temp 12/06/17 2054 98.5 F (36.9 C)     Temp Source 12/06/17 2054 Oral     SpO2 12/06/17 2054 100 %     Weight 12/06/17 2055 140 lb (63.5 kg)     Height 12/06/17 2055  (1.778 m)     Head Circumference --      Peak Flow --      Pain Score 12/06/17 2055 0     Pain Loc --      Pain Edu? --      Excl. in GC? --     Constitutional: Alert and oriented. Well appearing and in no acute distress. Eyes: Conjunctivae are normal. Head: Atraumatic. Nose: No congestion/rhinnorhea. Mouth/Throat: Mucous membranes are moist. Neck: No stridor.   Cardiovascular: Normal rate, regular rhythm. Grossly normal heart sounds.  Good peripheral circulation. Respiratory: Normal respiratory effort.  No retractions. Lungs CTAB. Gastrointestinal: Soft and nontender. No distention. Musculoskeletal: No lower extremity tenderness nor edema. Neurologic:  Normal speech and language.Skin:  Skin is warm, dry and intact. No rash noted. Psychiatric: Mood and affect are flat.  Does not appear to be responding to external stimuli.  Denies desire to harm himself or anyone else, but does report that he hears talking in his head. ____________________________________________   LABS (all labs ordered are listed, but only abnormal results are  displayed)  Labs Reviewed  COMPREHENSIVE METABOLIC PANEL - Abnormal; Notable for the following components:      Result Value   Calcium 8.8 (*)    ALT 15 (*)    All other components within normal limits  ACETAMINOPHEN LEVEL - Abnormal; Notable for the following components:   Acetaminophen (Tylenol), Serum <10 (*)    All other components within normal limits  CBC - Abnormal; Notable for the following components:   WBC 2.8 (*)    RBC 3.95 (*)    Hemoglobin 12.2 (*)    HCT 35.6 (*)    All other components within normal limits  URINE DRUG SCREEN, QUALITATIVE ([facility] ONLY) - Abnormal; Notable for the following components:   Cocaine Metabolite,Ur  POSITIVE (*)    All other components within normal limits  ETHANOL  SALICYLATE LEVEL   ____________________________________________  EKG   ____________________________________________  RADIOLOGY   ____________________________________________   PROCEDURES  Procedure(s) performed:  None  Procedures  Critical Care performed: No  ____________________________________________   INITIAL IMPRESSION / ASSESSMENT AND PLAN / ED COURSE  Pertinent labs & imaging results that were available during my care of the patient were reviewed by me and considered in my medical decision making (see chart for details).  Patient presents for evaluation for wandering from his group home and increasing hallucinations.  Negative for suicidal thoughts.  Reports he just gets bored or sick and does not enjoy the group home so he wanders out.  He is under IVC on arrival, will continue this in place psychiatric consult.  Does not appear to be any acute underlying medical condition identified at this time the patient is felt stable for psychiatric evaluation disposition likely based upon psychiatry recommendations.  ----------------------------------------- 11:28 PM on 12/06/2017 -----------------------------------------  Ongoing care and disposition  assigned to Dr. Zenda Alpers.  Follow-up on psych consultation recommendations      ____________________________________________   FINAL CLINICAL IMPRESSION(S) / ED DIAGNOSES  Final diagnoses:  Schizophrenia, unspecified type (HCC)  Wandering associated with mental disorder      NEW MEDICATIONS STARTED DURING THIS VISIT:  New Prescriptions   No medications on file     Note:  This document was prepared using Dragon voice recognition software and may include unintentional dictation errors.     Sharyn Creamer, MD 12/06/17 2329

## 2017-12-06 NOTE — ED Notes (Signed)
IVC/SOC ordered & called  

## 2017-12-06 NOTE — ED Triage Notes (Signed)
Pt states he walked away from his group home, [facility] today because he was frustrated. Pt denies SI or HI. Pt is here with IVC papers taken out by group home for "walking away from his group home three times this month...having auditory hallucinations....cannot provide for himself". Pt calm, cooperative.

## 2017-12-07 NOTE — BH Assessment (Signed)
Assessment Note  David Davila. is an 37 y.o. male who presents to the ED with IVC papers after walking away from his group home for the third time this month. Pt reports that he walked away because "it's just depressing there and I don't like it".  Pt has a hx of paranoid schizophrenia and has a TBI. His affect is flat with a monotone soft-spoken voice. Pt answered "no" to each question unless asked to elaborate. He denies SI/HI A/V H/D.   Diagnosis: Paranoid schizophrenia   Past Medical History:  Past Medical History:  Diagnosis Date  . Acute psychosis (HCC)   . Antisocial personality disorder (HCC)   . Cannabis abuse    Past  . Paranoid schizophrenia (HCC)   . Schizoaffective disorder, bipolar type (HCC)     History reviewed. No pertinent surgical history.  Family History: History reviewed. No pertinent family history.  Social History:  reports that he has been smoking cigarettes.  He has been smoking about 1.00 pack per day. He has never used smokeless tobacco. He reports that he has current or past drug history. Drugs: Cocaine and Marijuana. He reports that he does not drink alcohol.  Additional Social History:  Alcohol / Drug Use Pain Medications: See MAR Prescriptions: See MAR Over the Counter: See MAR History of alcohol / drug use?: Yes Substance #1 Name of Substance 1: Cocaine Substance #2 Name of Substance 2: Marijuana  CIWA: CIWA-Ar BP: 122/70 Pulse Rate: 81 COWS:    Allergies:  Allergies  Allergen Reactions  . Zyprexa [Olanzapine] Other (See Comments)    Reaction: unknown    Home Medications:  (Not in a hospital admission)  OB/GYN Status:  No LMP for male patient.  General Assessment Data Location of Assessment: [facility] ED TTS Assessment: In system Is this a Tele or Face-to-Face Assessment?: Face-to-Face Is this an Initial Assessment or a Re-assessment for this encounter?: Initial Assessment Marital status: Single Is patient pregnant?: No Pregnancy  Status: No Living Arrangements: Group Home Can pt return to current living arrangement?: Yes Admission Status: Involuntary Is patient capable of signing voluntary admission?: No Referral Source: Self/Family/Friend Insurance type: Medicare  Medical Screening Exam [facility] Walk-in ONLY) Medical Exam completed: Yes  Crisis Care Plan Living Arrangements: Group Home Legal Guardian: Other: Name of Psychiatrist: Upcoming appointment with [facility]  Name of Therapist: Upcoming appointment with [facility]   Education Status Is patient currently in school?: No Is the patient employed, unemployed or receiving disability?: Unemployed, Receiving disability income  Risk to self with the past 6 months Suicidal Ideation: No Has patient been a risk to self within the past 6 months prior to admission? : No Suicidal Intent: No Has patient had any suicidal intent within the past 6 months prior to admission? : No Is patient at risk for suicide?: No Suicidal Plan?: No Has patient had any suicidal plan within the past 6 months prior to admission? : No Access to Means: No What has been your use of drugs/alcohol within the last 12 months?: Pt denies use Previous Attempts/Gestures: No How many times?: 0 Other Self Harm Risks: none Triggers for Past Attempts: None known Intentional Self Injurious Behavior: None Family Suicide History: Unknown Recent stressful life event(s): Other (Comment) Persecutory voices/beliefs?: No Depression: Yes Depression Symptoms: Insomnia Substance abuse history and/or treatment for substance abuse?: Yes Suicide prevention information given to non-admitted patients: Not applicable  Risk to Others within the past 6 months Homicidal Ideation: No Does patient have any lifetime risk of violence  toward others beyond the six months prior to admission? : No Thoughts of Harm to Others: No Current Homicidal Intent: No Current Homicidal Plan: No Access to Homicidal Means: No Identified  Victim: n/a History of harm to others?: No Assessment of Violence: None Noted Violent Behavior Description: none reported Does patient have access to weapons?: No Criminal Charges Pending?: No Does patient have a court date: No Is patient on probation?: No  Psychosis Hallucinations: Auditory Delusions: None noted  Mental Status Report Appearance/Hygiene: In scrubs Eye Contact: Poor Motor Activity: Rigidity Speech: Soft, Slow Level of Consciousness: Alert Mood: Sad Affect: Flat Anxiety Level: None Thought Processes: Coherent, Relevant Judgement: Unimpaired Orientation: Person, Place, Time, Situation Obsessive Compulsive Thoughts/Behaviors: Minimal  Cognitive Functioning Concentration: Normal Memory: Recent Intact, Remote Intact Is patient IDD: No Is patient DD?: No Insight: Fair Impulse Control: Fair Appetite: Good Have you had any weight changes? : No Change Sleep: No Change Total Hours of Sleep: 8 Vegetative Symptoms: None  ADLScreening [facility] Assessment Services) Patient's cognitive ability adequate to safely complete daily activities?: Yes Patient able to express need for assistance with ADLs?: Yes Independently performs ADLs?: Yes (appropriate for developmental age)  Prior Inpatient Therapy Prior Inpatient Therapy: Yes Prior Therapy Dates: Multiple Hospitalizations  Prior Therapy Facilty/Provider(s): Multiple Hospitalizations Reason for Treatment: Depression & Psychosis  Prior Outpatient Therapy Prior Outpatient Therapy: Yes Prior Therapy Facilty/Provider(s): [facility] Reason for Treatment: Schizophrenia Does patient have an ACCT team?: No Does patient have Intensive In-House Services?  : No Does patient have [facility] services? : No Does patient have P4CC services?: No  ADL Screening (condition at time of admission) Patient's cognitive ability adequate to safely complete daily activities?: Yes Is the patient deaf or have difficulty hearing?: No Does the  patient have difficulty seeing, even when wearing glasses/contacts?: No Does the patient have difficulty concentrating, remembering, or making decisions?: No Patient able to express need for assistance with ADLs?: Yes Does the patient have difficulty dressing or bathing?: No Independently performs ADLs?: Yes (appropriate for developmental age) Does the patient have difficulty walking or climbing stairs?: No Weakness of Legs: None Weakness of Arms/Hands: None  Home Assistive Devices/Equipment Home Assistive Devices/Equipment: None  Therapy Consults (therapy consults require a physician order) PT Evaluation Needed: No OT Evalulation Needed: No SLP Evaluation Needed: No            Additional Information 1:1 In Past 12 Months?: No CIRT Risk: No Elopement Risk: No Does patient have medical clearance?: Yes  Child/Adolescent Assessment Running Away Risk: Denies(PT IS AN ADULT)  Disposition:  Disposition Initial Assessment Completed for this Encounter: Yes Disposition of Patient: (Pending recommendation ) Patient refused recommended treatment: No Mode of transportation if patient is discharged?: Car  On Site Evaluation by:   Reviewed with Physician:    Anet Logsdon D Henderson Frampton 12/07/2017 2:36 AM

## 2017-12-07 NOTE — ED Provider Notes (Signed)
-----------------------------------------   7:35 AM on 12/07/2017 -----------------------------------------   Blood pressure 122/70, pulse 81, temperature 98.5 F (36.9 C), temperature source Oral, resp. rate 16, height  (1.778 m), weight 63.5 kg (140 lb), SpO2 100 %.  Assuming care from Dr. Fanny Bien.  In short, David Davila. is a 37 y.o. male with a chief complaint of Psychiatric Evaluation .  Refer to the original H&P for additional details.  The current plan of care is to follow up with psych for further evaluation.    The patient was seen by tele-psychiatry and they did recommend discharge with reversal of the commitment.  He states that the patient is not psychotic and does not pose a danger to himself or other people.  We contacted his group home and his group home will pick him up.   Rebecka Apley, MD 12/07/17 [phone]

## 2017-12-07 NOTE — Progress Notes (Signed)
LCSW spoke with patient and he reports he does not want to return to group home.  This worker was listened to patient concerns and explained consequences if patient leaves the group home. He reports he has used crack in the last 2 weeks and understands its harmful effects. He reports he wont stay there. He was encouraged to contact his guardian and make new housing arrangements ASAP. He was encouraged to take his medications, eat well ,rest and SW offered to provide patient with community resources.  Patient declined any community supports at this time.  No further needs awaiting provider to call back with pick up time:  [employer] LCSW [phone]

## 2017-12-07 NOTE — ED Notes (Signed)
Telephone call placed to Patient's group home; to make aware that Patient has been discharged; group home staff stated that she'll be here to pick up Patient this A.M.

## 2017-12-07 NOTE — Progress Notes (Signed)
LCSW called patients Group home and spoke to Mrs Candace Cruise: [phone] and explained to her that patient is DC from the ED and she needs to make transportation arrangements for this patient. She stated she is concerned as" patient walks off and goes on a crack binge"  This worker re-iterated that if she is unable to meet his needs she needs to work that out between his guardian.   LCSW requested Ms Candace Cruise call this SW back with a pick up time for patient and provided my contact information  LCSW explained their are several community resources that could assist patient with his current behaviors.   LCSW left voice mail message for Ms Cherene Altes and Norval Gable [phone]    Arrie Senate LCSW [phone]

## 2017-12-07 NOTE — ED Notes (Signed)
Per Patient, I walked away from the group home; I left there thre times; I just wanted them to let me walk; I didn't know; they was going to bring me here; I don't like being there; I just wanted to walk; I'm not suicidal; I don't want to hurt nobody; I was just walking."

## 2017-12-07 NOTE — ED Notes (Signed)
Patient speaking with [facility].  Dr. Rod Can.

## 2017-12-07 NOTE — ED Notes (Signed)
RN spoke with group home administrator. RN informed Ms. Ladona Ridgel the patient's long acting injection has not been ordered by the psychiatrist or EDP. RN also reinforced the patient is still discharged and needs to be picked up. Ms. Ladona Ridgel stated, "We will just do the best we can with him."

## 2017-12-07 NOTE — Discharge Instructions (Signed)
Please follow up with psych °

## 2017-12-07 NOTE — ED Notes (Signed)
Pt discharged back to group home via group home representative. VS stable. Discharge paperwork signed by group home representative. All belongings returned to patient. Guardian notified of patient discharge and return home.

## 2017-12-07 NOTE — ED Notes (Signed)
Patient's ride is here to pick up patient. Pt dressing for discharge. Maintained on 15 minute checks and observation by security camera for safety.

## 2017-12-07 NOTE — ED Notes (Signed)
Pt has expressed to multiple staff members he does not want to return to his current group home. SW and RN have explained to patient the hospital cannot find him an alternate place to live. Pt cooperative at this time. Maintained on 15 minute checks and observation by security camera for safety.

## 2018-03-25 ENCOUNTER — Emergency Department
Admission: EM | Admit: 2018-03-25 | Discharge: 2018-03-26 | Payer: Medicare Other | Attending: Emergency Medicine | Admitting: Emergency Medicine

## 2018-03-25 ENCOUNTER — Encounter: Payer: Self-pay | Admitting: Emergency Medicine

## 2018-03-25 DIAGNOSIS — Z79899 Other long term (current) drug therapy: Secondary | ICD-10-CM | POA: Insufficient documentation

## 2018-03-25 DIAGNOSIS — Z9114 Patient's other noncompliance with medication regimen: Secondary | ICD-10-CM

## 2018-03-25 DIAGNOSIS — S069XAA Unspecified intracranial injury with loss of consciousness status unknown, initial encounter: Secondary | ICD-10-CM | POA: Diagnosis present

## 2018-03-25 DIAGNOSIS — F200 Paranoid schizophrenia: Secondary | ICD-10-CM | POA: Diagnosis not present

## 2018-03-25 DIAGNOSIS — S069X9A Unspecified intracranial injury with loss of consciousness of unspecified duration, initial encounter: Secondary | ICD-10-CM | POA: Diagnosis present

## 2018-03-25 DIAGNOSIS — F17210 Nicotine dependence, cigarettes, uncomplicated: Secondary | ICD-10-CM | POA: Insufficient documentation

## 2018-03-25 DIAGNOSIS — F209 Schizophrenia, unspecified: Secondary | ICD-10-CM

## 2018-03-25 DIAGNOSIS — F1410 Cocaine abuse, uncomplicated: Secondary | ICD-10-CM | POA: Diagnosis present

## 2018-03-25 LAB — COMPREHENSIVE METABOLIC PANEL
ALBUMIN: 3.7 g/dL (ref 3.5–5.0)
ALT: 17 U/L (ref 0–44)
AST: 33 U/L (ref 15–41)
Alkaline Phosphatase: 46 U/L (ref 38–126)
Anion gap: 6 (ref 5–15)
BUN: 12 mg/dL (ref 6–20)
CO2: 28 mmol/L (ref 22–32)
Calcium: 8.8 mg/dL — ABNORMAL LOW (ref 8.9–10.3)
Chloride: 106 mmol/L (ref 98–111)
Creatinine, Ser: 1.15 mg/dL (ref 0.61–1.24)
GFR calc Af Amer: >60 mL/min (ref >60)
GFR calc non Af Amer: >60 mL/min (ref >60)
GLUCOSE: 80 mg/dL (ref 70–99)
POTASSIUM: 4 mmol/L (ref 3.5–5.1)
Sodium: 140 mmol/L (ref 135–145)
Total Bilirubin: 1.1 mg/dL (ref 0.3–1.2)
Total Protein: 7.2 g/dL (ref 6.5–8.1)

## 2018-03-25 LAB — CBC
HEMATOCRIT: 36.2 % — AB (ref 40.0–52.0)
HEMOGLOBIN: 12.4 g/dL — AB (ref 13.0–18.0)
MCH: 31.4 pg (ref 26.0–34.0)
MCHC: 34.4 g/dL (ref 32.0–36.0)
MCV: 91.5 fL (ref 80.0–100.0)
Platelets: 148 10*3/uL — ABNORMAL LOW (ref 150–440)
RBC: 3.96 MIL/uL — ABNORMAL LOW (ref 4.40–5.90)
RDW: 14 % (ref 11.5–14.5)
WBC: 4 10*3/uL (ref 3.8–10.6)

## 2018-03-25 LAB — ETHANOL

## 2018-03-25 MED ORDER — PALIPERIDONE PALMITATE ER 234 MG/1.5ML IM SUSY
234.0000 mg | PREFILLED_SYRINGE | Freq: Once | INTRAMUSCULAR | Status: DC
  Administered 2018-03-26: 234 mg via INTRAMUSCULAR
  Filled 2018-03-25: qty 1.5

## 2018-03-25 MED ORDER — HYDROXYZINE HCL 25 MG PO TABS: 25.0000 mg | ORAL_TABLET | Freq: Three times a day (TID) | ORAL | Status: DC

## 2018-03-25 MED ORDER — LORAZEPAM 2 MG/ML IJ SOLN
INTRAMUSCULAR | Status: AC
  Filled 2018-03-25: qty 1

## 2018-03-25 MED ORDER — HALOPERIDOL LACTATE 5 MG/ML IJ SOLN
INTRAMUSCULAR | Status: AC
  Filled 2018-03-25: qty 1

## 2018-03-25 MED ORDER — DIPHENHYDRAMINE HCL 50 MG/ML IJ SOLN
INTRAMUSCULAR | Status: AC
  Filled 2018-03-25: qty 1

## 2018-03-25 MED ORDER — HALOPERIDOL LACTATE 5 MG/ML IJ SOLN
5.0000 mg | Freq: Once | INTRAMUSCULAR | Status: AC
  Administered 2018-03-25: 5 mg via INTRAMUSCULAR

## 2018-03-25 MED ORDER — LORAZEPAM 2 MG/ML IJ SOLN
2.0000 mg | Freq: Once | INTRAMUSCULAR | Status: AC
  Administered 2018-03-25: 2 mg via INTRAMUSCULAR

## 2018-03-25 MED ORDER — DIVALPROEX SODIUM ER 250 MG PO TB24
1000.0000 mg | ORAL_TABLET | Freq: Every day | ORAL | Status: DC
  Filled 2018-03-25: qty 4

## 2018-03-25 MED ORDER — LORAZEPAM 2 MG PO TABS
2.0000 mg | ORAL_TABLET | Freq: Once | ORAL | Status: AC
  Administered 2018-03-26: 2 mg via ORAL
  Filled 2018-03-25 (×2): qty 1

## 2018-03-25 MED ORDER — DIVALPROEX SODIUM 500 MG PO DR TAB
1000.0000 mg | DELAYED_RELEASE_TABLET | Freq: Two times a day (BID) | ORAL | Status: DC
  Administered 2018-03-26 (×2): 1000 mg via ORAL
  Filled 2018-03-25 (×3): qty 2

## 2018-03-25 MED ORDER — DIPHENHYDRAMINE HCL 50 MG/ML IJ SOLN
25.0000 mg | Freq: Once | INTRAMUSCULAR | Status: AC
  Administered 2018-03-25: 25 mg via INTRAMUSCULAR

## 2018-03-25 NOTE — ED Notes (Signed)
This RN along with 3 officers and 3 staff members to ask pt to take medications. Pt began to argue and yell at the staff members and threaten officers.

## 2018-03-25 NOTE — ED Notes (Signed)
IVC PENDING  CONSULT ?

## 2018-03-25 NOTE — ED Notes (Signed)
Patient refusing to let staff take vitals or get blood work. [place]uffs [place]

## 2018-03-25 NOTE — BH Assessment (Signed)
Pt referral information faxed to:  [facility]    [address] [place] [address] Phone: [phone] Fax: [phone] [facility]   [address] [place] [address] Phone: [phone] Fax: [phone] [facility]    [address] [place] [address] Phone: [phone] Fax: [phone] [facility]    [address] [place] [address] Phone: [phone] Fax: [phone] [facility] [address] [place], [place] [address] Phone: [phone] Fax: [phone] [facility] [address] [place], [place] [address] Phone: [phone] Fax: [phone] [facility] [address] [place], [place] [address] Phone: [phone] Fax: [phone]

## 2018-03-25 NOTE — Consult Note (Signed)
[facility] Face-to-Face Psychiatry Consult   Reason for Consult: Consult for this 37 year old man with a history of schizophrenia substance abuse and intellectual disability brought in because of agitation. Referring Physician: Jacqualine Code Patient Identification: David Davila. MRN:  [id number] Principal Diagnosis: Paranoid schizophrenia ([place]) Diagnosis:   Patient Active Problem List   Diagnosis Date Noted  . TBI (traumatic brain injury) ([place]) [S06.9X9A] 07/10/2016  . Vitamin B12 deficiency [E53.8] 07/10/2016  . Noncompliance with medication regimen [Z91.14] 06/25/2016  . Cocaine use disorder, mild, abuse ([place]) [F14.10] 06/25/2016  . Cannabis use disorder, mild, abuse [F12.10] 06/25/2016  . Paranoid schizophrenia ([place]) [F20.0] 06/24/2016    Total Time spent with patient: 1 hour  Subjective:   [name]. is a 37 y.o. male patient admitted with patient not able to give any history.[name]  HPI: Patient with a history of schizophrenia substance abuse and possibly traumatic brain injury.  Group home reports that he has been increasingly agitated aggressive threatening non-cooperative with medicine.  Came to the emergency room today already very aggressive bizarre agitated disorganized.  Talking and almost word salad.  Unable to cooperate with full assessment.  Drug screen not yet available.  Report is that he has not been compliant with his antipsychotic or other medicine.  Social history: Does not appear to have family involvement very much.  Has been at his current facility a relatively short time.  Sounds like he has had a lot of hospitalizations and institutionalization.  Medical history: Possible history of traumatic brain injury  Substance abuse history: History of cocaine abuse repeatedly unknown if he is been using now.  Past Psychiatric History: Patient appears to have had several prior hospitalizations diagnosis of schizophrenia and has been maintained on antipsychotics and mood stabilizers  but has a history of noncompliance.  Risk to Self:   Risk to Others:   Prior Inpatient Therapy:   Prior Outpatient Therapy:    Past Medical History:  Past Medical History:  Diagnosis Date  . Acute psychosis ([place])   . Antisocial personality disorder ([place])   . Cannabis abuse    Past  . Paranoid schizophrenia ([place])   . Schizoaffective disorder, bipolar type ([place])    History reviewed. No pertinent surgical history. Family History: No family history on file. Family Psychiatric  History: Unknown Social History:  Social History   Substance and Sexual Activity  Alcohol Use No     Social History   Substance and Sexual Activity  Drug Use Yes  . Types: Cocaine, Marijuana    Social History   Socioeconomic History  . Marital status: Single    Spouse name: Not on file  . Number of children: Not on file  . Years of education: Not on file  . Highest education level: Not on file  Occupational History  . Not on file  Social Needs  . Financial resource strain: Not on file  . Food insecurity:    Worry: Not on file    Inability: Not on file  . Transportation needs:    Medical: Not on file    Non-medical: Not on file  Tobacco Use  . Smoking status: Current Every Day Smoker    Packs/day: 1.00    Types: Cigarettes  . Smokeless tobacco: Never Used  Substance and Sexual Activity  . Alcohol use: No  . Drug use: Yes    Types: Cocaine, Marijuana  . Sexual activity: Not on file  Lifestyle  . Physical activity:    Days per week: Not on  file    Minutes per session: Not on file  . Stress: Not on file  Relationships  . Social connections:    Talks on phone: Not on file    Gets together: Not on file    Attends religious service: Not on file    Active member of club or organization: Not on file    Attends meetings of clubs or organizations: Not on file    Relationship status: Not on file  Other Topics Concern  . Not on file  Social History Narrative  . Not on file    Additional Social History:    Allergies:   Allergies  Allergen Reactions  . Zyprexa [Olanzapine] Other (See Comments)    Reaction: unknown    Labs:  Results for orders placed or performed during the hospital encounter of 03/25/18 (from the past 48 hour(s))  Comprehensive metabolic panel     Status: Abnormal   Collection Time: 03/25/18  9:15 PM  Result Value Ref Range   Sodium 140 135 - 145 mmol/L   Potassium 4.0 3.5 - 5.1 mmol/L   Chloride 106 98 - 111 mmol/L   CO2 28 22 - 32 mmol/L   Glucose, Bld 80 70 - 99 mg/dL   BUN 12 6 - 20 mg/dL   Creatinine, Ser 1.15 0.61 - 1.24 mg/dL   Calcium 8.8 (L) 8.9 - 10.3 mg/dL   Total Protein 7.2 6.5 - 8.1 g/dL   Albumin 3.7 3.5 - 5.0 g/dL   AST 33 15 - 41 U/L   ALT 17 0 - 44 U/L   Alkaline Phosphatase 46 38 - 126 U/L   Total Bilirubin 1.1 0.3 - 1.2 mg/dL   GFR calc non Af Amer >60 >60 mL/min   GFR calc Af Amer >60 >60 mL/min    Comment: (NOTE) The eGFR has been calculated using the CKD EPI equation. This calculation has not been validated in all clinical situations. eGFR's persistently <60 mL/min signify possible Chronic Kidney Disease.    Anion gap 6 5 - 15    Comment: Performed at [facility], [address] [place], [place] [address]  cbc     Status: Abnormal   Collection Time: 03/25/18  9:15 PM  Result Value Ref Range   WBC 4.0 3.8 - 10.6 K/uL   RBC 3.96 (L) 4.40 - 5.90 MIL/uL   Hemoglobin 12.4 (L) 13.0 - 18.0 g/dL   HCT 36.2 (L) 40.0 - 52.0 %   MCV 91.5 80.0 - 100.0 fL   MCH 31.4 26.0 - 34.0 pg   MCHC 34.4 32.0 - 36.0 g/dL   RDW 14.0 11.5 - 14.5 %   Platelets 148 (L) 150 - 440 K/uL    Comment: Performed at [facility], [address], [place], [place] [address]    Current Facility-Administered Medications  Medication Dose Route Frequency Provider Last Rate Last Dose  . divalproex (DEPAKOTE) DR tablet 1,000 mg  1,000 mg Oral Q12H Kanisha Duba T, MD      . LORazepam (ATIVAN) tablet 2 mg  2 mg  Oral Once Delman Kitten, MD      . paliperidone (INVEGA SUSTENNA) injection 234 mg  234 mg Intramuscular Once Joas Motton, Madie Reno, MD       Current Outpatient Medications  Medication Sig Dispense Refill  . atorvastatin (LIPITOR) 10 MG tablet Take 1 tablet (10 mg total) by mouth daily at 6 PM. (Patient not taking: Reported on 11/16/2017) 30 tablet 0  . divalproex (DEPAKOTE  ER) 500 MG 24 hr tablet Take 1,000 mg by mouth daily.    . hydrOXYzine (ATARAX/VISTARIL) 25 MG tablet Take 1 tablet (25 mg total) by mouth 3 (three) times daily. (Patient not taking: Reported on 11/16/2017) 30 tablet 0  . lamoTRIgine (LAMICTAL) 100 MG tablet Take 1 tablet (100 mg total) by mouth every evening. (Patient not taking: Reported on 11/16/2017) 30 tablet 0  . lamoTRIgine (LAMICTAL) 25 MG tablet Take 2 tablets (50 mg total) by mouth daily. (Patient not taking: Reported on 11/16/2017) 60 tablet 0  . loperamide (IMODIUM) 2 MG capsule Take 2 mg by mouth as needed for diarrhea or loose stools.    [name] loratadine (CLARITIN) 10 MG tablet Take 10 mg by mouth daily.    [name] LORazepam (ATIVAN) 0.5 MG tablet Take 1 tablet (0.5 mg total) by mouth 2 (two) times daily. (Patient not taking: Reported on 11/16/2017) 60 tablet 0  . OLANZapine zydis (ZYPREXA) 5 MG disintegrating tablet Take 1 tablet (5 mg total) by mouth at bedtime. (Patient not taking: Reported on 11/16/2017) 30 tablet 0  . paliperidone (INVEGA SUSTENNA) 156 MG/ML SUSP injection Inject 1 mL (156 mg total) into the muscle every 28 (twenty-eight) days. NEXT DOSE DUE August 13, 2016 (Patient taking differently: Inject 156 mg into the muscle every 28 (twenty-eight) days. ) 0.9 mL 0  . traZODone (DESYREL) 100 MG tablet Take 1 tablet (100 mg total) by mouth at bedtime. (Patient not taking: Reported on 11/16/2017) 30 tablet 0  . traZODone (DESYREL) 50 MG tablet Take 50 mg by mouth at bedtime as needed for sleep.      Musculoskeletal: Strength & Muscle Tone: within normal limits Gait & Station:  normal Patient leans: N/A  Psychiatric Specialty Exam: Physical Exam  Nursing note and vitals reviewed. Constitutional: He appears well-developed and well-nourished.  HENT:  Head: Normocephalic and atraumatic.  Eyes: Pupils are equal, round, and reactive to light. Conjunctivae are normal.  Neck: Normal range of motion.  Cardiovascular: Regular rhythm and normal heart sounds.  Respiratory: Effort normal.  GI: Soft.  Musculoskeletal: Normal range of motion.  Neurological: He is alert.  Skin: Skin is warm and dry.  Psychiatric: His affect is labile. His speech is tangential. He is agitated. Thought content is paranoid and delusional. Cognition and memory are impaired. He expresses inappropriate judgment.    Review of Systems  Constitutional: Negative.   HENT: Negative.   Eyes: Negative.   Respiratory: Negative.   Cardiovascular: Negative.   Gastrointestinal: Negative.   Musculoskeletal: Negative.   Skin: Negative.   Neurological: Negative.   Psychiatric/Behavioral: Negative.     There were no vitals taken for this visit.There is no height or weight on file to calculate BMI.  General Appearance: Disheveled  Eye Contact:  Minimal  Speech:  Garbled  Volume:  Decreased  Mood:  Anxious  Affect:  Restricted  Thought Process:  Disorganized  Orientation:  Negative  Thought Content:  Illogical  Suicidal Thoughts:  No  Homicidal Thoughts:  Yes.  without intent/plan  Memory:  Immediate;   Poor Recent;   Poor Remote;   Poor  Judgement:  Impaired  Insight:  Shallow  Psychomotor Activity:  Increased  Concentration:  Concentration: Poor  Recall:  Poor  Fund of Knowledge:  Poor  Language:  Poor  Akathisia:  No  Handed:  Right  AIMS (if indicated):     Assets:  Housing  ADL's:  Impaired  Cognition:  Impaired,  Mild  Sleep:  Treatment Plan Summary: Daily contact with patient to assess and evaluate symptoms and progress in treatment, Medication management and Plan  Patient was schizophrenia substance abuse admitted with agitation and disorganization.  Noncompliance with medicine.  Meets commitment criteria for psychosis.  I will give him a full size dose of long-acting Invega in the emergency room and restart Depakote.  Plans for admission when we have a bed.  Disposition: Recommend psychiatric Inpatient admission when medically cleared. Supportive therapy provided about ongoing stressors.  Alethia Berthold, MD 03/25/2018 10:18 PM
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
## 2018-03-25 NOTE — ED Notes (Signed)
ED Provider at bedside. 

## 2018-03-25 NOTE — ED Notes (Signed)
Asked pt if I sent a male nurse in to get blood and urine, if he would allow. Pt keeps repeating "What is blood in urine, what is blood in urine. You didn't say blood and urine." Pt isnt allowig this Rn to speak. Pt continues to lay with covers over the head, and not look at RN. Pt states he is not giving us anything.
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
## 2018-03-25 NOTE — ED Notes (Signed)
Spoke with Britta [facility]ccreedyBarbara at [facility]. She states pt is noncompliant on his meds as he will not stay at the doctor office to get his injection. Pt moved into the group home in march . Pt has a guardian Concepcion Elkatasha Scott [phone]. Number to the group home [phone]

## 2018-03-26 ENCOUNTER — Other Ambulatory Visit: Payer: Self-pay

## 2018-03-26 ENCOUNTER — Inpatient Hospital Stay
Admission: AD | Admit: 2018-03-26 | Discharge: 2018-05-18 | DRG: 885 | Disposition: A | Discharge status: Home / Self Care | Payer: Medicare Other | Source: Intra-hospital | Attending: Psychiatry | Admitting: Psychiatry

## 2018-03-26 DIAGNOSIS — Z888 Allergy status to other drugs, medicaments and biological substances status: Secondary | ICD-10-CM | POA: Diagnosis not present

## 2018-03-26 DIAGNOSIS — Z87891 Personal history of nicotine dependence: Secondary | ICD-10-CM

## 2018-03-26 DIAGNOSIS — Z87820 Personal history of traumatic brain injury: Secondary | ICD-10-CM | POA: Diagnosis not present

## 2018-03-26 DIAGNOSIS — D709 Neutropenia, unspecified: Secondary | ICD-10-CM | POA: Diagnosis not present

## 2018-03-26 DIAGNOSIS — Z9114 Patient's other noncompliance with medication regimen: Secondary | ICD-10-CM | POA: Diagnosis not present

## 2018-03-26 DIAGNOSIS — Z111 Encounter for screening for respiratory tuberculosis: Secondary | ICD-10-CM | POA: Diagnosis not present

## 2018-03-26 DIAGNOSIS — Z751 Person awaiting admission to adequate facility elsewhere: Secondary | ICD-10-CM | POA: Diagnosis not present

## 2018-03-26 DIAGNOSIS — F23 Brief psychotic disorder: Secondary | ICD-10-CM | POA: Diagnosis present

## 2018-03-26 DIAGNOSIS — Z79899 Other long term (current) drug therapy: Secondary | ICD-10-CM | POA: Diagnosis not present

## 2018-03-26 DIAGNOSIS — F203 Undifferentiated schizophrenia: Secondary | ICD-10-CM | POA: Diagnosis present

## 2018-03-26 DIAGNOSIS — F209 Schizophrenia, unspecified: Secondary | ICD-10-CM | POA: Diagnosis not present

## 2018-03-26 DIAGNOSIS — F200 Paranoid schizophrenia: Secondary | ICD-10-CM | POA: Diagnosis not present

## 2018-03-26 LAB — URINE DRUG SCREEN, QUALITATIVE (ARMC ONLY)
AMPHETAMINES, UR SCREEN: NOT DETECTED
Barbiturates, Ur Screen: NOT DETECTED
Cannabinoid 50 Ng, Ur ~~LOC~~: NOT DETECTED
Cocaine Metabolite,Ur ~~LOC~~: NOT DETECTED
MDMA (ECSTASY) UR SCREEN: NOT DETECTED
Methadone Scn, Ur: NOT DETECTED
OPIATE, UR SCREEN: NOT DETECTED
PHENCYCLIDINE (PCP) UR S: NOT DETECTED
Tricyclic, Ur Screen: NOT DETECTED

## 2018-03-26 MED ORDER — DIVALPROEX SODIUM 500 MG PO DR TAB
1000.0000 mg | DELAYED_RELEASE_TABLET | Freq: Two times a day (BID) | ORAL | Status: DC
  Administered 2018-03-27 – 2018-04-03 (×13): 1000 mg via ORAL
  Filled 2018-03-26 (×17): qty 2

## 2018-03-26 MED ORDER — ACETAMINOPHEN 325 MG PO TABS
650.0000 mg | ORAL_TABLET | Freq: Four times a day (QID) | ORAL | Status: DC | PRN
  Filled 2018-03-26: qty 2

## 2018-03-26 MED ORDER — HYDROXYZINE HCL 50 MG PO TABS
50.0000 mg | ORAL_TABLET | Freq: Three times a day (TID) | ORAL | Status: DC | PRN
  Administered 2018-03-27 – 2018-05-09 (×7): 50 mg via ORAL
  Filled 2018-03-26 (×7): qty 1

## 2018-03-26 MED ORDER — MAGNESIUM HYDROXIDE 400 MG/5ML PO SUSP: 30.0000 mL | Freq: Every day | ORAL | Status: DC | PRN

## 2018-03-26 MED ORDER — BENZTROPINE MESYLATE 1 MG PO TABS
1.0000 mg | ORAL_TABLET | ORAL | Status: AC
  Administered 2018-03-26: 1 mg via ORAL
  Filled 2018-03-26: qty 1

## 2018-03-26 MED ORDER — ALUM & MAG HYDROXIDE-SIMETH 200-200-20 MG/5ML PO SUSP: 30.0000 mL | ORAL | Status: DC | PRN

## 2018-03-26 MED ORDER — TRAZODONE HCL 100 MG PO TABS
100.0000 mg | ORAL_TABLET | Freq: Every evening | ORAL | Status: DC | PRN
  Administered 2018-03-27 – 2018-05-17 (×25): 100 mg via ORAL
  Filled 2018-03-26 (×25): qty 1

## 2018-03-26 MED ORDER — PALIPERIDONE PALMITATE ER 234 MG/1.5ML IM SUSY: 234.0000 mg | PREFILLED_SYRINGE | Freq: Once | INTRAMUSCULAR | Status: DC

## 2018-03-26 MED ORDER — CHLORPROMAZINE HCL 50 MG PO TABS
50.0000 mg | ORAL_TABLET | ORAL | Status: AC
  Administered 2018-03-26: 50 mg via ORAL
  Filled 2018-03-26: qty 1

## 2018-03-26 NOTE — ED Notes (Signed)
First attempt to call report. Staff stated nurse is in med room and will call me back.

## 2018-03-26 NOTE — ED Notes (Signed)
Hourly rounding reveals patient sleeping in room. No complaints, stable, in no acute distress. Q15 minute rounds and monitoring via Security to continue. 

## 2018-03-26 NOTE — ED Notes (Signed)
Pt given supper tray.

## 2018-03-26 NOTE — ED Notes (Signed)
Pharmacy delivered paliperidone (INVEGA SUSTENNA) injection 234 mg and Given to patient Patient was appropriate and cooperative. Given in right deltoid. NAD

## 2018-03-26 NOTE — ED Notes (Signed)
Patient in doorway yelling I need some meds , Dr Juluis Mirelapaas talking with patient now.

## 2018-03-26 NOTE — Consult Note (Signed)
[facility] Consult   Reason for Consult: Follow-up consult for this 37 year old man with schizophrenia who is still in the emergency room Referring Physician: Jimmye Norman Patient Identification: David Davila. MRN:  [id number] Principal Diagnosis: Paranoid schizophrenia ([place]) Diagnosis:   Patient Active Problem List   Diagnosis Date Noted  . TBI (traumatic brain injury) ([place]) [S06.9X9A] 07/10/2016  . Vitamin B12 deficiency [E53.8] 07/10/2016  . Noncompliance with medication regimen [Z91.14] 06/25/2016  . Cocaine use disorder, mild, abuse ([place]) [F14.10] 06/25/2016  . Cannabis use disorder, mild, abuse [F12.10] 06/25/2016  . Paranoid schizophrenia ([place]) [F20.0] 06/24/2016    Total Time spent with patient: 30 minutes  Subjective:   Gurnie Duris. is a 37 y.o. male patient admitted with "I need some more of my medicine".  HPI: Patient seen chart reviewed.  Yesterday the patient was still ultimately either agitated or oversedated and difficult to assess but orders were put into put him back on his antipsychotic and mood stabilizing medicine.  Today the patient was calmer and was able to talk to me.  He appeared anxious and a little tremulous.  He told me he needed more medicine because he was nervous and still hearing voices.  He has been cooperative with medication both injections and oral medicine today.  Remains disorganized in his thinking but has not acted out and has been able to take care of his ADLs.  Drug screen was obtained and appears to be negative  Past Psychiatric History: Patient has a diagnosis of schizophrenia.  Possible head injury details uncertain.  Chronic psychotic disorder.  Risk to Self:   Risk to Others:   Prior Inpatient Therapy:   Prior Outpatient Therapy:    Past Medical History:  Past Medical History:  Diagnosis Date  . Acute psychosis ([place])   . Antisocial personality disorder ([place])   . Cannabis abuse    Past  . Paranoid schizophrenia  ([place])   . Schizoaffective disorder, bipolar type ([place])    History reviewed. No pertinent surgical history. Family History: No family history on file. Family Psychiatric  History: None known Social History:  Social History   Substance and Sexual Activity  Alcohol Use No     Social History   Substance and Sexual Activity  Drug Use Yes  . Types: Cocaine, Marijuana    Social History   Socioeconomic History  . Marital status: Single    Spouse name: Not on file  . Number of children: Not on file  . Years of education: Not on file  . Highest education level: Not on file  Occupational History  . Not on file  Social Needs  . Financial resource strain: Not on file  . Food insecurity:    Worry: Not on file    Inability: Not on file  . Transportation needs:    Medical: Not on file    Non-medical: Not on file  Tobacco Use  . Smoking status: Current Every Day Smoker    Packs/day: 1.00    Types: Cigarettes  . Smokeless tobacco: Never Used  Substance and Sexual Activity  . Alcohol use: No  . Drug use: Yes    Types: Cocaine, Marijuana  . Sexual activity: Not on file  Lifestyle  . Physical activity:    Days per week: Not on file    Minutes per session: Not on file  . Stress: Not on file  Relationships  . Social connections:    Talks on phone: Not on file  Gets together: Not on file    Attends religious service: Not on file    Active member of club or organization: Not on file    Attends meetings of clubs or organizations: Not on file    Relationship status: Not on file  Other Topics Concern  . Not on file  Social History Narrative  . Not on file   Additional Social History:    Allergies:   Allergies  Allergen Reactions  . Zyprexa [Olanzapine] Other (See Comments)    Reaction: unknown    Labs:  Results for orders placed or performed during the hospital encounter of 03/25/18 (from the past 48 hour(s))  Comprehensive metabolic panel     Status: Abnormal    Collection Time: 03/25/18  9:15 PM  Result Value Ref Range   Sodium 140 135 - 145 mmol/L   Potassium 4.0 3.5 - 5.1 mmol/L   Chloride 106 98 - 111 mmol/L   CO2 28 22 - 32 mmol/L   Glucose, Bld 80 70 - 99 mg/dL   BUN 12 6 - 20 mg/dL   Creatinine, Ser 1.15 0.61 - 1.24 mg/dL   Calcium 8.8 (L) 8.9 - 10.3 mg/dL   Total Protein 7.2 6.5 - 8.1 g/dL   Albumin 3.7 3.5 - 5.0 g/dL   AST 33 15 - 41 U/L   ALT 17 0 - 44 U/L   Alkaline Phosphatase 46 38 - 126 U/L   Total Bilirubin 1.1 0.3 - 1.2 mg/dL   GFR calc non Af Amer >60 >60 mL/min   GFR calc Af Amer >60 >60 mL/min    Comment: (NOTE) The eGFR has been calculated using the CKD EPI equation. This calculation has not been validated in all clinical situations. eGFR's persistently <60 mL/min signify possible Chronic Kidney Disease.    Anion gap 6 5 - 15    Comment: Performed at [facility], [address] [place], [place] [address]  Ethanol     Status: None   Collection Time: 03/25/18  9:15 PM  Result Value Ref Range   Alcohol, Ethyl (B) <10 <10 mg/dL    Comment: (NOTE) Lowest detectable limit for serum alcohol is 10 mg/dL. For medical purposes only. Performed at [facility], [address] [place], [place] [address]   cbc     Status: Abnormal   Collection Time: 03/25/18  9:15 PM  Result Value Ref Range   WBC 4.0 3.8 - 10.6 K/uL   RBC 3.96 (L) 4.40 - 5.90 MIL/uL   Hemoglobin 12.4 (L) 13.0 - 18.0 g/dL   HCT 36.2 (L) 40.0 - 52.0 %   MCV 91.5 80.0 - 100.0 fL   MCH 31.4 26.0 - 34.0 pg   MCHC 34.4 32.0 - 36.0 g/dL   RDW 14.0 11.5 - 14.5 %   Platelets 148 (L) 150 - 440 K/uL    Comment: Performed at [facility], [address] [place], [place] [address]  Urine Drug Screen, Qualitative     Status: Abnormal   Collection Time: 03/26/18 12:10 PM  Result Value Ref Range   Tricyclic, Ur Screen NONE DETECTED NONE DETECTED   Amphetamines, Ur Screen NONE DETECTED NONE DETECTED   MDMA (Ecstasy)Ur Screen NONE  DETECTED NONE DETECTED   Cocaine Metabolite,Ur [place] NONE DETECTED NONE DETECTED   Opiate, Ur Screen NONE DETECTED NONE DETECTED   Phencyclidine (PCP) Ur S NONE DETECTED NONE DETECTED   Cannabinoid 50 Ng, Ur [place] NONE DETECTED NONE DETECTED   Barbiturates, Ur  Screen NONE DETECTED NONE DETECTED   Benzodiazepine, Ur Scrn TEST NOT PERFORMED, REAGENT NOT AVAILABLE (A) NONE DETECTED   Methadone Scn, Ur NONE DETECTED NONE DETECTED    Comment: (NOTE) Tricyclics + metabolites, urine    Cutoff 1000 ng/mL Amphetamines + metabolites, urine  Cutoff 1000 ng/mL MDMA (Ecstasy), urine              Cutoff 500 ng/mL Cocaine Metabolite, urine          Cutoff 300 ng/mL Opiate + metabolites, urine        Cutoff 300 ng/mL Phencyclidine (PCP), urine         Cutoff 25 ng/mL Cannabinoid, urine                 Cutoff 50 ng/mL Barbiturates + metabolites, urine  Cutoff 200 ng/mL Benzodiazepine, urine              Cutoff 200 ng/mL Methadone, urine                   Cutoff 300 ng/mL The urine drug screen provides only a preliminary, unconfirmed analytical test result and should not be used for non-medical purposes. Clinical consideration and professional judgment should be applied to any positive drug screen result due to possible interfering substances. A more specific alternate chemical method must be used in order to obtain a confirmed analytical result. Gas chromatography / mass spectrometry (GC/MS) is the preferred confirmat ory method. Performed at [facility], [address] [place], [place] [address]     Current Facility-Administered Medications  Medication Dose Route Frequency Provider Last Rate Last Dose  . divalproex (DEPAKOTE) DR tablet 1,000 mg  1,000 mg Oral Q12H Clapacs, Madie Reno, MD   1,000 mg at 03/26/18 1034   Current Outpatient Medications  Medication Sig Dispense Refill  . divalproex (DEPAKOTE ER) 500 MG 24 hr tablet Take 1,000 mg by mouth daily.    [name] loratadine (CLARITIN) 10 MG  tablet Take 10 mg by mouth daily.    . paliperidone (INVEGA SUSTENNA) 156 MG/ML SUSP injection Inject 1 mL (156 mg total) into the muscle every 28 (twenty-eight) days. NEXT DOSE DUE August 13, 2016 (Patient taking differently: Inject 156 mg into the muscle every 28 (twenty-eight) days. ) 0.9 mL 0  . traZODone (DESYREL) 100 MG tablet Take 1 tablet (100 mg total) by mouth at bedtime. (Patient taking differently: Take 50 mg by mouth at bedtime. ) 30 tablet 0  . atorvastatin (LIPITOR) 10 MG tablet Take 1 tablet (10 mg total) by mouth daily at 6 PM. (Patient not taking: Reported on 11/16/2017) 30 tablet 0  . hydrOXYzine (ATARAX/VISTARIL) 25 MG tablet Take 1 tablet (25 mg total) by mouth 3 (three) times daily. (Patient not taking: Reported on 11/16/2017) 30 tablet 0  . lamoTRIgine (LAMICTAL) 100 MG tablet Take 1 tablet (100 mg total) by mouth every evening. (Patient not taking: Reported on 11/16/2017) 30 tablet 0  . lamoTRIgine (LAMICTAL) 25 MG tablet Take 2 tablets (50 mg total) by mouth daily. (Patient not taking: Reported on 11/16/2017) 60 tablet 0  . LORazepam (ATIVAN) 0.5 MG tablet Take 1 tablet (0.5 mg total) by mouth 2 (two) times daily. (Patient not taking: Reported on 11/16/2017) 60 tablet 0  . OLANZapine zydis (ZYPREXA) 5 MG disintegrating tablet Take 1 tablet (5 mg total) by mouth at bedtime. (Patient not taking: Reported on 11/16/2017) 30 tablet 0    Musculoskeletal: Strength & Muscle Tone: within normal limits Gait &  Station: normal Patient leans: N/A  Psychiatric Specialty Exam: Physical Exam  Nursing note and vitals reviewed. Constitutional: He appears well-developed and well-nourished.  HENT:  Head: Normocephalic and atraumatic.  Eyes: Pupils are equal, round, and reactive to light. Conjunctivae are normal.  Neck: Normal range of motion.  Cardiovascular: Regular rhythm and normal heart sounds.  Respiratory: Effort normal. No respiratory distress.  GI: Soft.  Musculoskeletal: Normal range of  motion.  Neurological: He is alert.  Skin: Skin is warm and dry.  Psychiatric: His mood appears anxious. His affect is blunt. His speech is delayed. He is slowed. Thought content is paranoid. Cognition and memory are impaired. He expresses impulsivity. He expresses no homicidal and no suicidal ideation.    Review of Systems  Constitutional: Negative.   HENT: Negative.   Eyes: Negative.   Respiratory: Negative.   Cardiovascular: Negative.   Gastrointestinal: Negative.   Musculoskeletal: Negative.   Skin: Negative.   Neurological: Negative.   Psychiatric/Behavioral: Positive for hallucinations and memory loss. Negative for depression, substance abuse and suicidal ideas. The patient is nervous/anxious and has insomnia.     Blood pressure 101/62, pulse (!) 54, temperature 98.2 F (36.8 C), temperature source Oral, resp. rate 16, SpO2 99 %.There is no height or weight on file to calculate BMI.  General Appearance: Disheveled  Eye Contact:  Fair  Speech:  Slow  Volume:  Decreased  Mood:  Anxious  Affect:  Congruent  Thought Process:  Goal Directed  Orientation:  Full (Time, Place, and Person)  Thought Content:  Logical and Hallucinations: Auditory  Suicidal Thoughts:  No  Homicidal Thoughts:  No  Memory:  Immediate;   Fair Recent;   Poor Remote;   Poor  Judgement:  Fair  Insight:  Fair  Psychomotor Activity:  Restlessness  Concentration:  Concentration: Poor  Recall:  [employer] of Knowledge:  Fair  Language:  Fair  Akathisia:  No  Handed:  Right  AIMS (if indicated):     Assets:  Desire for Improvement Housing Resilience  ADL's:  Impaired  Cognition:  Impaired,  Mild  Sleep:        Treatment Plan Summary: Daily contact with patient to assess and evaluate symptoms and progress in treatment, Medication management and Plan Patient was given his injection of the larger size dose of Invega long-acting suspension.  He also has been taking Depakote and when he requested more  medicine for his nerves today I gave him Thorazine at his request as well as some Cogentin.  Patient has calm down but is still psychotic and needs hospitalization.  Orders are completed.  Recommend he be admitted to the psychiatric unit at the soonest opportunity.  No current change in medication plan other than what is detailed above.  Physically appears to be stable.  Disposition: Recommend psychiatric Inpatient admission when medically cleared.  Alethia Berthold, MD 03/26/2018 6:03 PM

## 2018-03-26 NOTE — BH Assessment (Signed)
[facility] by Dr. Toni Amendlapacs.  Attending Physician will be Dr. Johnella MoloneyMcNew.   Patient has been [facility] to room 324, [facility] [place].   ER staff is aware of the admission:  Davy PiqueLuan, ER Kathrynn SpeedSectary   Dr. Mayford KnifeWilliams, ER MD   Geralynn OchsJadeka, Patient's Nurse   Marylene LandAngela, Patient Access.

## 2018-03-26 NOTE — Tx Team (Signed)
Initial Treatment Plan 03/26/2018 11:14 PM David MullerPaul Stephenson Groll Jr. ZOX:[id number]    PATIENT STRESSORS: Medication change or noncompliance Substance abuse   PATIENT STRENGTHS: General fund of knowledge   PATIENT IDENTIFIED PROBLEMS:   Psychosis     Substance Abuse                DISCHARGE CRITERIA:  Motivation to continue treatment in a less acute level of care  PRELIMINARY DISCHARGE PLAN: Outpatient therapy Placement in alternative living arrangements  PATIENT/FAMILY INVOLVEMENT: This treatment plan has been presented to and reviewed with the patient, David Mulleraul Stephenson Vesey Jr., The patient and family have been given the opportunity to ask questions and make suggestions.  Trula Orelubukola Abisola Kepler Mccabe, RN 03/26/2018, 11:14 PM

## 2018-03-26 NOTE — ED Notes (Signed)
IVC  SEEN  BY  DR  CLAPACS  PENDING  PLACEMENT 

## 2018-03-26 NOTE — ED Notes (Signed)
Second attempt to call report and staff stated that we could not bring pt down till 10pm after med pass. Pt moved to BHU due to pt flow.

## 2018-03-26 NOTE — ED Notes (Signed)
Pt IVC and pending Psych Admit after medically cleared.

## 2018-03-27 DIAGNOSIS — F209 Schizophrenia, unspecified: Secondary | ICD-10-CM

## 2018-03-27 LAB — LIPID PANEL
Cholesterol: 184 mg/dL (ref 0–200)
HDL: 50 mg/dL (ref >40)
LDL Cholesterol: 125 mg/dL — ABNORMAL HIGH (ref 0–99)
Total CHOL/HDL Ratio: 3.7 RATIO
Triglycerides: 43 mg/dL (ref <150)
VLDL: 9 mg/dL (ref 0–40)

## 2018-03-27 LAB — HEMOGLOBIN A1C
HEMOGLOBIN A1C: 5.4 % (ref 4.8–5.6)
MEAN PLASMA GLUCOSE: 108.28 mg/dL

## 2018-03-27 LAB — TSH: TSH: 1.666 u[IU]/mL (ref 0.350–4.500)

## 2018-03-27 MED ORDER — HALOPERIDOL LACTATE 5 MG/ML IJ SOLN
5.0000 mg | Freq: Four times a day (QID) | INTRAMUSCULAR | Status: DC | PRN
  Administered 2018-03-31 – 2018-04-01 (×2): 5 mg via INTRAMUSCULAR
  Filled 2018-03-27 (×2): qty 1

## 2018-03-27 MED ORDER — LORAZEPAM 1 MG PO TABS
1.0000 mg | ORAL_TABLET | Freq: Four times a day (QID) | ORAL | Status: DC | PRN
  Administered 2018-03-27 – 2018-04-16 (×5): 1 mg via ORAL
  Filled 2018-03-27 (×5): qty 1

## 2018-03-27 MED ORDER — PALIPERIDONE PALMITATE ER 156 MG/ML IM SUSY: 156.0000 mg | PREFILLED_SYRINGE | Freq: Once | INTRAMUSCULAR | Status: DC

## 2018-03-27 MED ORDER — HALOPERIDOL 5 MG PO TABS: 5.0000 mg | ORAL_TABLET | Freq: Four times a day (QID) | ORAL | Status: DC | PRN

## 2018-03-27 MED ORDER — LORAZEPAM 2 MG/ML IJ SOLN
1.0000 mg | Freq: Four times a day (QID) | INTRAMUSCULAR | Status: DC | PRN
  Administered 2018-03-31 – 2018-04-01 (×2): 1 mg via INTRAMUSCULAR
  Filled 2018-03-27 (×2): qty 1

## 2018-03-27 MED ORDER — PALIPERIDONE ER 3 MG PO TB24
9.0000 mg | ORAL_TABLET | Freq: Every day | ORAL | Status: DC
  Administered 2018-03-29 – 2018-04-02 (×4): 9 mg via ORAL
  Filled 2018-03-27 (×7): qty 3

## 2018-03-27 NOTE — [facility] Counselor (Signed)
CSW attempted to contact the patients group home [facility] [phone]) to obtain more information on the patient. There was no answer. CSW unable to leave a voicemail.  Johny Shearsassandra Shatera Rennert, MSW, Theresia MajorsLCSWA, Bridget HartshornLCASA Clinical Social Worker 03/27/2018 11:36 AM

## 2018-03-27 NOTE — [facility] Group Notes (Signed)
  03/27/2018  Time: 1PM  Type of Therapy and Topic:  Group Therapy:  Feelings around Relapse and Recovery  Participation Level:  Minimal   Description of Group:    Patients in this group will discuss emotions they experience before and after a relapse. They will process how experiencing these feelings, or avoidance of experiencing them, relates to having a relapse. Facilitator will guide patients to explore emotions they have related to recovery. Patients will be encouraged to process which emotions are more powerful. They will be guided to discuss the emotional reaction significant others in their lives may have to their relapse or recovery. Patients will be assisted in exploring ways to respond to the emotions of others without this contributing to a relapse.  Therapeutic Goals: 1. Patient will identify two or more emotions that lead to a relapse for them 2. Patient will identify two emotions that result when they relapse 3. Patient will identify two emotions related to recovery 4. Patient will demonstrate ability to communicate their needs through discussion and/or role plays   Summary of Patient Progress: Pt attended group but chose not to participate.     Therapeutic Modalities:   Cognitive Behavioral Therapy Solution-Focused Therapy Assertiveness Training Relapse Prevention Therapy  Heidi DachKelsey Brailyn Delman, MSW, LCSW Clinical Social Worker 03/27/2018 1:39 PM

## 2018-03-27 NOTE — [facility] Suicide Risk Assessment (Signed)
[facility] INPATIENT:  Family/Significant Other Suicide Prevention Education  Suicide Prevention Education:  Contact Attempts: David Davila is legal guardian [phone] [employer] of Bonna GainsChas been identified by the patient as the family member/significant other with whom the patient will be residing, and identified as the person(s) who will aid the patient in the event of a mental health crisis.  With written consent from the patient, two attempts were made to provide suicide prevention education, prior to and/or following the patient's discharge.  We were unsuccessful in providing suicide prevention education.  A suicide education pamphlet was given to the patient to share with family/significant other.  Date and time of first attempt: CSW attempted to contact the patients family member/ significant other to complete SPE and obtain collateral information. CSW left a voicemail with a callback number on 03/27/2018 9:39 AM  Date and time of second attempt:  David ShearsCassandra  [name] 03/27/2018, 9:38 AM

## 2018-03-27 NOTE — Tx Team (Addendum)
Interdisciplinary Treatment and Diagnostic Plan Update  03/27/2018 Time of Session: 11am David Mulleraul Stephenson Bene Jr. MRN: [id number]  Principal Diagnosis: <principal problem not specified>  Secondary Diagnoses: Active Problems:   Schizophrenia (HCC)   Current Medications:  Current Facility-Administered Medications  Medication Dose Route Frequency Provider Last Rate Last Dose  . acetaminophen (TYLENOL) tablet 650 mg  650 mg Oral Q6H PRN Clapacs, John T, MD      . alum & mag hydroxide-simeth (MAALOX/MYLANTA) 200-200-20 MG/5ML suspension 30 mL  30 mL Oral Q4H PRN Clapacs, John T, MD      . divalproex (DEPAKOTE) DR tablet 1,000 mg  1,000 mg Oral Q12H Clapacs, John T, MD      . hydrOXYzine (ATARAX/VISTARIL) tablet 50 mg  50 mg Oral TID PRN Clapacs, John T, MD      . magnesium hydroxide (MILK OF MAGNESIA) suspension 30 mL  30 mL Oral Daily PRN Clapacs, Jackquline DenmarkJohn T, MD      . David Davila[START ON 04/03/2018] paliperidone (INVEGA SUSTENNA) injection 156 mg  156 mg Intramuscular Once McNew, Holly R, MD      . paliperidone (INVEGA) 24 hr tablet 9 mg  9 mg Oral Daily McNew, Holly R, MD      . traZODone (DESYREL) tablet 100 mg  100 mg Oral QHS PRN Clapacs, Jackquline DenmarkJohn T, MD       PTA Medications: Medications Prior to Admission  Medication Sig Dispense Refill Last Dose  . atorvastatin (LIPITOR) 10 MG tablet Take 1 tablet (10 mg total) by mouth daily at 6 PM. (Patient not taking: Reported on 11/16/2017) 30 tablet 0 Not Taking at Unknown time  . divalproex (DEPAKOTE ER) 500 MG 24 hr tablet Take 1,000 mg by mouth daily.   unknown at unknown  . hydrOXYzine (ATARAX/VISTARIL) 25 MG tablet Take 1 tablet (25 mg total) by mouth 3 (three) times daily. (Patient not taking: Reported on 11/16/2017) 30 tablet 0 Not Taking at Unknown time  . lamoTRIgine (LAMICTAL) 100 MG tablet Take 1 tablet (100 mg total) by mouth every evening. (Patient not taking: Reported on 11/16/2017) 30 tablet 0 Not Taking at Unknown time  . lamoTRIgine (LAMICTAL) 25 MG  tablet Take 2 tablets (50 mg total) by mouth daily. (Patient not taking: Reported on 11/16/2017) 60 tablet 0 Not Taking at Unknown time  . loratadine (CLARITIN) 10 MG tablet Take 10 mg by mouth daily.   unknown at unknown  . LORazepam (ATIVAN) 0.5 MG tablet Take 1 tablet (0.5 mg total) by mouth 2 (two) times daily. (Patient not taking: Reported on 11/16/2017) 60 tablet 0 Not Taking at Unknown time  . OLANZapine zydis (ZYPREXA) 5 MG disintegrating tablet Take 1 tablet (5 mg total) by mouth at bedtime. (Patient not taking: Reported on 11/16/2017) 30 tablet 0 Not Taking at Unknown time  . paliperidone (INVEGA SUSTENNA) 156 MG/ML SUSP injection Inject 1 mL (156 mg total) into the muscle every 28 (twenty-eight) days. NEXT DOSE DUE August 13, 2016 (Patient taking differently: Inject 156 mg into the muscle every 28 (twenty-eight) days. ) 0.9 mL 0   . traZODone (DESYREL) 100 MG tablet Take 1 tablet (100 mg total) by mouth at bedtime. (Patient taking differently: Take 50 mg by mouth at bedtime. ) 30 tablet 0 Not Taking at Unknown time    Patient Stressors: Medication change or noncompliance Substance abuse  Patient Strengths: General fund of knowledge  Treatment Modalities: Medication Management, Group therapy, Case management,  1 to 1 session with clinician, Psychoeducation, Recreational therapy.  Physician Treatment Plan for Primary Diagnosis: <principal problem not specified> Long Term Goal(s):     Short Term Goals:    Medication Management: Evaluate patient's response, side effects, and tolerance of medication regimen.  Therapeutic Interventions: 1 to 1 sessions, Unit Group sessions and Medication administration.  Evaluation of Outcomes: Progressing  Physician Treatment Plan for Secondary Diagnosis: Active Problems:   Schizophrenia (HCC)  Long Term Goal(s):     Short Term Goals:       Medication Management: Evaluate patient's response, side effects, and tolerance of medication  regimen.  Therapeutic Interventions: 1 to 1 sessions, Unit Group sessions and Medication administration.  Evaluation of Outcomes: Progressing   RN Treatment Plan for Primary Diagnosis: <principal problem not specified> Long Term Goal(s): Knowledge of disease and therapeutic regimen to maintain health will improve  Short Term Goals: Ability to verbalize feelings will improve, Ability to identify and develop effective coping behaviors will improve and Compliance with prescribed medications will improve  Medication Management: RN will administer medications as ordered by provider, will assess and evaluate patient's response and provide education to patient for prescribed medication. RN will report any adverse and/or side effects to prescribing provider.  Therapeutic Interventions: 1 on 1 counseling sessions, Psychoeducation, Medication administration, Evaluate responses to treatment, Monitor vital signs and CBGs as ordered, Perform/monitor CIWA, COWS, AIMS and Fall Risk screenings as ordered, Perform wound care treatments as ordered.  Evaluation of Outcomes: Progressing   LCSW Treatment Plan for Primary Diagnosis: <principal problem not specified> Long Term Goal(s): Safe transition to appropriate next level of care at discharge, Engage patient in therapeutic group addressing interpersonal concerns.  Short Term Goals: Engage patient in aftercare planning with referrals and resources, Increase social support, Increase emotional regulation, Identify triggers associated with mental health/substance abuse issues and Increase skills for wellness and recovery  Therapeutic Interventions: Assess for all discharge needs, 1 to 1 time with Social worker, Explore available resources and support systems, Assess for adequacy in community support network, Educate family and significant other(s) on suicide prevention, Complete Psychosocial Assessment, Interpersonal group therapy.  Evaluation of Outcomes:  Progressing   Progress in Treatment: Attending groups: No. Participating in groups: No. Taking medication as prescribed: Yes. Toleration medication: Yes. Family/Significant other contact made: Yes, individual(s) contacted:  Patients Guardian Patient understands diagnosis: Yes. Discussing patient identified problems/goals with staff: Yes. Medical problems stabilized or resolved: Yes. Denies suicidal/homicidal ideation: Yes. Issues/concerns per patient self-inventory: No. Other:   New problem(s) identified: No, Describe:  None  New Short Term/Long Term Goal(s): "Take care of my family."  Patient Goals:   "Take care of my family."  Discharge Plan or Barriers: To discharge back to his group home and work with an Investment banker, operationalACTT Team.  Reason for Continuation of Hospitalization: Medication stabilization  Estimated Length of Stay: 5-7 days Recreational Therapy: Patient Stressors: N/A Patient Goal: Patient will engage in interactions with peers and staff in pro-social manner at least 2x within 5 recreation therapy group sessions   Attendees: Patient: David Davila 03/27/2018 11:21 AM  Physician: Corinna GabHolly McNew, MD 03/27/2018 11:21 AM  Nursing: Milas HockShatara Powell, RN  03/27/2018 11:21 AM  RN Care Manager: 03/27/2018 11:21 AM  Social Worker: Johny Shearsassandra Jarrett, LCSWA 03/27/2018 11:21 AM  Recreational Therapist: Danella DeisShay. Dreama SaaOutlaw CTRS, LRT 03/27/2018 11:21 AM  Other: Heidi DachKelsey Craig, LCSW 03/27/2018 11:21 AM  Other: Huey RomansSonya Carter, LCSW 03/27/2018 11:21 AM  Other: 03/27/2018 11:21 AM    Scribe for Treatment Team: Johny Shearsassandra  Jarrett, LCSW 03/27/2018 11:21 AM

## 2018-03-27 NOTE — [facility] Suicide Risk Assessment (Signed)
[facility] INPATIENT:  Family/Significant Other Suicide Prevention Education  Suicide Prevention Education:  Education Completed;David Davila is legal guardian [phone] [employer] [place] has been identified by the patient as the family member/significant other with whom the patient will be residing, and identified as the person(s) who will aid the patient in the event of a mental health crisis (suicidal ideations/suicide attempt).  With written consent from the patient, the family member/significant other has been provided the following suicide prevention education, prior to the and/or following the discharge of the patient.  The suicide prevention education provided includes the following:  Suicide risk factors  Suicide prevention and interventions  National Suicide Hotline telephone number  [facility] assessment telephone number  [address] [facility] Emergency Assistance 911  [facility] and/or Residential Mobile Crisis Unit telephone number  Request made of family/significant other to:  Remove weapons (e.g., guns, rifles, knives), all items previously/currently identified as safety concern.    Remove drugs/medications (over-the-counter, prescriptions, illicit drugs), all items previously/currently identified as a safety concern.  The family member/significant other verbalizes understanding of the suicide prevention education information provided.  The family member/significant other agrees to remove the items of safety concern listed above.   CSW spoke to the patients guardian regarding the precipitating factors that led to his admission. She says he has been intermittently off of his medications. She says that he started walking away from his group home and refusing to take his medications. She reports that he had a 30 day notice but it was retracted. After 2 days they implemented the notice because he was being aggressive and not taking his medications. She says that he has an  doctors appointment on the 8/6. She says that the group home says that the patient wouldn't go. They tried to take him and the patient got out of the car and went somewhere else. She says that when they did get him into the doctors office he refused to comply. He walked out of the office. The doctor refused to prescribe the medications without seeing him. He pushed the [facility] provider and they IVC'd him. She says that he has been off of his injection for 2 months and is unsure of what injection he was on. Guardian reports that if he is referred to an ACTT Team his group home will keep him there. She says that she is looking into [facility] or [facility]. She says that she is not sure if he has had any hospitalizations in the last year. She reports that the patient has no access to any guns/weapons. CSW answered all questions, addressed concerns and will coordinate with all providers.  David ShearsCassandra  David Davila 03/27/2018, 9:41 AM

## 2018-03-27 NOTE — [facility] Counselor (Signed)
Adult Comprehensive Assessment  Patient ID: David Mulleraul Stephenson Levenhagen Jr., male   DOB: 04/15/1981, 37 y.o.   MRN: [id number]  Information Source: Information source: Patient  Current Stressors:  Patient states their primary concerns and needs for treatment are:: Pt reports "I had left [facility] adn they took me to someones house. I left there and they was telling me to come back. Then they called the police on me." Patient states their goals for this hospitilization and ongoing recovery are:: "I have no reason for being here." Educational / Learning stressors: None reported Employment / Job issues: On disabilty Family Relationships: Pt. distant from family members Financial / Lack of resources (include bankruptcy): None Housing / Lack of housing: Pt. lives in a group home. Physical health (include injuries & life threatening diseases): None reported Social relationships: None reported Substance abuse: Pt. reports some alcohol use  Bereavement / Loss: None reported  Living/Environment/Situation:  Living Arrangements: Group Home Living conditions (as described by patient or guardian): [facility] Who else lives in the home?: Group home members How long has patient lived in current situation?: Pt. reports 3 months What is atmosphere in current home: Comfortable, Chaotic  Family History:  Marital status: Single Are you sexually active?: No Has your sexual activity been affected by drugs, alcohol, medication, or emotional stress?: N/A Does patient have children?: No  Childhood History:  By whom was/is the patient raised?: Father Additional childhood history information: Pt. reports that he never knew his mother. Chart [place] Description of patient's relationship with caregiver when they were a child: Pt. reports having a good relationship with his father. Patient's description of current relationship with  people who raised him/her: Pt. reports that he still has a good relationship with his father How were you disciplined when you got in trouble as a child/adolescent?: None reported Does patient have siblings?: No Did patient suffer any verbal/emotional/physical/sexual abuse as a child?: No Did patient suffer from severe childhood neglect?: No Has patient ever been sexually abused/assaulted/raped as an adolescent or adult?: No Was the patient ever a victim of a crime or a disaster?: No Witnessed domestic violence?: No Has patient been effected by domestic violence as an adult?: No  Education:  Highest grade of school patient has completed: GED Currently a Consulting civil engineerstudent?: No Learning disability?: No  Employment/Work Situation:   Employment situation: On disability Why is patient on disability: mental health How long has patient been on disability: unknown Patient's job has been impacted by current illness: Yes Describe how patient's job has been impacted: Mental Health What is the longest time patient has a held a job?: 2.5 weeks Where was the patient employed at that time?: [employer] Did You Receive Any Psychiatric Treatment/Services While in the [employer]?: No Are There Guns or Other Weapons in Your Home?: No  Financial Resources:   Financial resources: [employer], Receives SSI Does patient have a Lawyerrepresentative payee or guardian?: Yes Name of representative payee or guardian: Chart List- Bertram Millardracy Boubacar is legal guardian [phone] [employer] [place]  Alcohol/Substance Abuse:   What has been your use of drugs/alcohol within the last 12 months?: Pt. reports some Alcohol use.  If attempted suicide, did drugs/alcohol play a role in this?: No Alcohol/Substance Abuse Treatment Hx: Denies past history Has alcohol/substance abuse ever caused legal problems?: No  Social Support System:   Patient's Community Support System: None Describe Community Support System: Pt. reports none, chart  reports that he  has a guardian Type of faith/religion: Pt. reports "I believe in love." How does patient's faith help to cope with current illness?: "It just helps me with people."  Leisure/Recreation:   Leisure and Hobbies: None reported  Strengths/Needs:   What is the patient's perception of their strengths?: "Remembering things that make sense adn keeping promises." Patient states they can use these personal strengths during their treatment to contribute to their recovery: "I feel like I'll dpo better if I do whats right." Patient states these barriers may affect/interfere with their treatment: None reported Patient states these barriers may affect their return to the community: None reported Other important information patient would like considered in planning for their treatment: None reported  Discharge Plan:   Currently receiving community mental health services: (Unknown) Patient states concerns and preferences for aftercare planning are: None reported Patient states they will know when they are safe and ready for discharge when: None reported Does patient have access to transportation?: No Does patient have financial barriers related to discharge medications?: No Patient description of barriers related to discharge medications: N/A Plan for no access to transportation at discharge: CSW will assess for transportation at discharge Will patient be returning to same living situation after discharge?: (Unknown)  Summary/Recommendations:   Summary and Recommendations (to be completed by the evaluator): Patient is a 37 year old African American male admitted involuntarily with schizophrenia. Patient is unsure of why he is here in the hospital. Chart reports that the patient has been off his medications and hearing voices. Patients chart indicates that he has been living at a group home and has a guardian with the [employer] [place]. Patient reports some alcohol use before he was admitted because "they were giving  me medications and trying to poison me". His UDS was negative for all substances. His affect was flat. At discharge, patient will return to his group home and attend outpatient treatment. While here, patient will benefit from crisis stabilization, medication evaluation, group therapy and psychoeducation, in addition to case management for discharge planning. At discharge, it is recommended that patient remain compliant with the established discharge plan and continue treatment.   David Shearsassandra  Tashari Davila. 03/27/2018

## 2018-03-27 NOTE — [facility] Suicide Risk Assessment (Signed)
[facility]   Nursing information obtained from:  Patient Demographic factors:  Male, Adolescent or young adult Current Mental Status:  NA Loss Factors:  NA Historical Factors:  Family history of mental illness or substance abuse Risk Reduction Factors:  Positive coping skills or problem solving skills, Religious beliefs about death  Total Time spent with patient: 1 hour Principal Problem: Schizophrenia (HCC) Diagnosis:   Patient Active Problem List   Diagnosis Date Noted  . Schizophrenia (HCC) [F20.9] 03/26/2018    Priority: High  . TBI (traumatic brain injury) (HCC) [S06.9X9A] 07/10/2016  . Vitamin B12 deficiency [E53.8] 07/10/2016  . Noncompliance with medication regimen [Z91.14] 06/25/2016  . Cocaine use disorder, mild, abuse (HCC) [F14.10] 06/25/2016  . Cannabis use disorder, mild, abuse [F12.10] 06/25/2016  . Paranoid schizophrenia (HCC) [F20.0] 06/24/2016   Subjective Data: See H&P  Continued Clinical Symptoms:  Alcohol Use Disorder Identification Test Final Score (AUDIT): 0 The "Alcohol Use Disorders Identification Test", Guidelines for Use in Primary Care, Second Edition.  World Science writerHealth Organization [facility](WHO). Score between 0-7:  no or low risk or alcohol related problems. Score between 8-15:  moderate risk of alcohol related problems. Score between 16-19:  high risk of alcohol related problems. Score 20 or above:  warrants further diagnostic evaluation for alcohol dependence and treatment.   CLINICAL FACTORS:   Schizophrenia:   Less than 38 years old Paranoid or undifferentiated type        COGNITIVE FEATURES THAT CONTRIBUTE TO RISK:  None    SUICIDE RISK:   Mild:  Suicidal ideation of limited frequency, intensity, duration, and specificity.  There are no identifiable plans, no associated intent, mild dysphoria and related symptoms, good self-control (both objective and subjective assessment), few other risk factors, and identifiable  protective factors, including available and accessible social support.  PLAN OF CARE: See h&P  I certify that inpatient services furnished can reasonably be expected to improve the patient's condition.   Haskell RilingHolly R Ahkeem Goede, MD 03/27/2018, 2:01 PM
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
## 2018-03-27 NOTE — Progress Notes (Signed)
Admission Note:  37 yr male who presents IVC in no acute distress for the treatment of Psychosis, he's alert and oriented x 4 with delayed response noted, Patient demes SI/HI/AVH appears flat and sad, he was calm and cooperative with admission process. Patient stated he lives in a group home and they are frustrating him. Patient has Past medical Hx of TBI, Substance Abuse, and Noncompliance with medication. Patient skin was assessed with Mellody DanceKeith MHT and skin was found to be clear of any abnormal marks, and he was also searched for contra bound none found. POC and unit policies explained and understanding verbalized, and consents obtained. Food and fluids offered, and fluids accepted.No distress noted, 15 minutes safety checks maintained will continue to monitor. .[name]
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
## 2018-03-27 NOTE — Plan of Care (Signed)
D: patient is confused, interacting with internal stimuli. Patient denies SI/HI/AVH. Patient is seen in milieu but isolates to self. Patient is at times agitated with staff. Patient refuses medications, and is paranoid. Patient states "I don't want any medication unless the doctor is giving them to me."  A: Patient's safety is maintained on unit. Patient is provided with scheduled medications.  R: patient is non-compliant with medication. Provided is notified of patient action and behaviors.   Problem: Safety: Goal: Periods of time without injury will increase Outcome: Progressing   Problem: Safety: Goal: Ability to remain free from injury will improve Outcome: Progressing   Problem: Coping: Goal: Ability to verbalize frustrations and anger appropriately will improve Outcome: Not Progressing Goal: Ability to demonstrate self-control will improve Outcome: Not Progressing   Problem: Physical Regulation: Goal: Ability to maintain clinical measurements within normal limits will improve Outcome: Not Progressing   Problem: Education: Goal: Will be free of psychotic symptoms Outcome: Not Progressing   Problem: Coping: Goal: Will verbalize feelings Outcome: Not Progressing   Problem: Role Relationship: Goal: Ability to communicate needs accurately will improve Outcome: Not Progressing Goal: Ability to interact with others will improve Outcome: Not Progressing

## 2018-03-27 NOTE — Progress Notes (Signed)
Recreation Therapy Notes  Date: 03/27/2018  Time: 9:30 am  Location: Craft Room  Behavioral response: Appropriate  Intervention Topic: Leisure  Discussion/Intervention:  Group content today was focused on leisure. The group defined what leisure is and some positive leisure activities they participate in. Individuals identified the difference between good and bad leisure. Participants expressed how they feel after participating in the leisure of their choice. The group discussed how they go about picking a leisure activity and if others are involved in their leisure activities. The patient stated how many leisure activities they too choose from and reasons why it is important to have leisure time. Individuals participated in the intervention "Leisure Jeopardy" where they had a chance to identify new leisure activities as well as benefits of leisure. Clinical Observations/Feedback:  Patient attended group. Roxan Yamamoto LRT/CTRS         Manvir Prabhu 03/27/2018 12:59 PM

## 2018-03-27 NOTE — Plan of Care (Signed)
  Problem: Education: Goal: Verbalization of understanding the information provided will improve Outcome: Progressing  Patient verbalized understanding of information provided and consent was given.

## 2018-03-27 NOTE — Progress Notes (Signed)
Patient ID: David Mulleraul Stephenson Strohmeier Jr., male   DOB: 02/19/1981, 37 y.o.   MRN: [id number] PER STATE REGULATIONS 482.30  THIS CHART WAS REVIEWED FOR MEDICAL NECESSITY WITH RESPECT TO THE PATIENT'S ADMISSION/DURATION OF STAY.  NEXT REVIEW DATE:03/30/18  Loura HaltBARBARA Esteban Kobashigawa, RN, BSN CASE MANAGER

## 2018-03-28 LAB — VALPROIC ACID LEVEL: Valproic Acid Lvl: 67 ug/mL (ref 50.0–100.0)

## 2018-03-28 NOTE — Progress Notes (Signed)
D: Patient alert and oriented x 3 with periods of confusion to situation, patient denies SI/HI/AVH but noted responding to internal stimuli. Patient is pleasant and cooperative with treatment after various cues ans encouragement. Patient's speech sometimes incoherent but non pressured,  he appears less anxious and he is not interacting with peers and staff appropriately.  A: Patient was offered support and encouragement, and was given scheduled medications.Patient was also encouraged to attend groups, and 15 minute checks were maintained  for safety.  R:Patient did not attend evening group, he is complaint with medication, and has no complaints.Safety maintained on unit, will continue to monitor.

## 2018-03-28 NOTE — Plan of Care (Signed)
D: Patient denies SI/HI/AVH. Patient has minimal interaction with staff. Patient refuses oral medication. Patient is witnessed interacting with internal stimuli. Patient is pacing in hallways. Patient is in milieu but isolates to self, no interaction with peers. Patient attends group for a short amount of time.  Patient has no complaints during this shift.  A: Patient's safety is maintained on this unit. Patient is offered scheduled medications.   R: Patient has no complaints.    Problem: Education: Goal: Knowledge of [place] [facility] Education information/materials will improve Outcome: Not Progressing Goal: Emotional status will improve Outcome: Not Progressing Goal: Mental status will improve Outcome: Not Progressing

## 2018-03-28 NOTE — BHH Group Notes (Signed)
LCSW Group Therapy Note  03/28/2018 1:15pm  Type of Therapy and Topic:  Group Therapy:  Healthy Self Image and Positive Change  Participation Level:  None   Description of Group:  In this group, patients will compare and contrast their current "I am...." statements to the visions they identify as desirable for their lives.  Patients discuss fears and how they can make positive changes in their cognitions that will positively impact their behaviors.  Facilitator played a motivational 3-minute speech and patients were left with the task of thinking about what "I am...." statements they can start using in their lives immediately.  Therapeutic Goals: 1. Patient will state their current self-perception as expressed in an "I Am" statement 2. Patient will contrast this with their desired vision for their live 3. Patient will identify 3 fears that negatively impact their behavior 4. Patient will discuss cognitive distortions that stem from their fears 5. Patient will verbalize statements that challenge their cognitive distortions  Summary of Patient Progress:  The patient attended group but did not participate.    Therapeutic Modalities Cognitive Behavioral Therapy Motivational Interviewing  Ladora Osterberg  CUEBAS-COLON, LCSW 03/28/2018 12:15 PM

## 2018-03-28 NOTE — Plan of Care (Signed)
  Problem: Coping: Goal: Ability to verbalize frustrations and anger appropriately will improve Outcome: Progressing  Patient verbalized frustration of his group home to staff.

## 2018-03-28 NOTE — Progress Notes (Signed)
[facility]  03/28/2018 3:38 PM David MullerPaul Stephenson Minix Jr.  MRN:  [id number] Subjective:    37 yo male admitted due to severe agitation and aggression at his group home. Pt wandering on unit, states hearing voices  Of " derogatory things", pt talking to himself at times, refusing meds, pacing disorganized.  Principal Problem: Schizophrenia (HCC) Diagnosis:   Patient Active Problem List   Diagnosis Date Noted  . Schizophrenia (HCC) [F20.9] 03/26/2018  . TBI (traumatic brain injury) (HCC) [S06.9X9A] 07/10/2016  . Vitamin B12 deficiency [E53.8] 07/10/2016  . Noncompliance with medication regimen [Z91.14] 06/25/2016  . Cocaine use disorder, mild, abuse (HCC) [F14.10] 06/25/2016  . Cannabis use disorder, mild, abuse [F12.10] 06/25/2016  . Paranoid schizophrenia (HCC) [F20.0] 06/24/2016   Total Time spent with patient: 25 min  Past Psychiatric History: no new info  Past Medical History:  Past Medical History:  Diagnosis Date  . Acute psychosis (HCC)   . Antisocial personality disorder (HCC)   . Cannabis abuse    Past  . Paranoid schizophrenia (HCC)   . Schizoaffective disorder, bipolar type (HCC)    History reviewed. No pertinent surgical history. Family History: History reviewed. No pertinent family history. Family Psychiatric  History: no new info Social History:  Social History   Substance and Sexual Activity  Alcohol Use No     Social History   Substance and Sexual Activity  Drug Use Not Currently    Social History   Socioeconomic History  . Marital status: Single    Spouse name: Not on file  . Number of children: Not on file  . Years of education: Not on file  . Highest education level: Not on file  Occupational History  . Not on file  Social Needs  . Financial resource strain: Not on file  . Food insecurity:    Worry: Not on file    Inability: Not on file  . Transportation needs:    Medical: Not on file    Non-medical: Not on file  Tobacco Use   . Smoking status: Former Smoker    Packs/day: 1.00    Types: Cigarettes  . Smokeless tobacco: Never Used  Substance and Sexual Activity  . Alcohol use: No  . Drug use: Not Currently  . Sexual activity: Not Currently  Lifestyle  . Physical activity:    Days per week: Not on file    Minutes per session: Not on file  . Stress: Not on file  Relationships  . Social connections:    Talks on phone: Not on file    Gets together: Not on file    Attends religious service: Not on file    Active member of club or organization: Not on file    Attends meetings of clubs or organizations: Not on file    Relationship status: Not on file  Other Topics Concern  . Not on file  Social History Narrative  . Not on file   Additional Social History:                         Sleep: Fair  Appetite:  Fair  Current Medications: Current Facility-Administered Medications  Medication Dose Route Frequency Provider Last Rate Last Dose  . acetaminophen (TYLENOL) tablet 650 mg  650 mg Oral Q6H PRN Clapacs, John T, MD      . alum & mag hydroxide-simeth (MAALOX/MYLANTA) 200-200-20 MG/5ML suspension 30 mL  30 mL Oral Q4H PRN Clapacs, John T,  MD      . divalproex (DEPAKOTE) DR tablet 1,000 mg  1,000 mg Oral Q12H Clapacs, John T, MD   1,000 mg at 03/27/18 2100  . haloperidol (HALDOL) tablet 5 mg  5 mg Oral Q6H PRN McNew, Ileene Hutchinson, MD       Or  . haloperidol lactate (HALDOL) injection 5 mg  5 mg Intramuscular Q6H PRN McNew, Ileene Hutchinson, MD      . hydrOXYzine (ATARAX/VISTARIL) tablet 50 mg  50 mg Oral TID PRN Clapacs, Jackquline Denmark, MD   50 mg at 03/27/18 2145  . LORazepam (ATIVAN) tablet 1 mg  1 mg Oral Q6H PRN Haskell Riling, MD   1 mg at 03/27/18 2145   Or  . LORazepam (ATIVAN) injection 1 mg  1 mg Intramuscular Q6H PRN McNew, Ileene Hutchinson, MD      . magnesium hydroxide (MILK OF MAGNESIA) suspension 30 mL  30 mL Oral Daily PRN Clapacs, Jackquline Denmark, MD      . David Davila ON 04/03/2018] paliperidone (INVEGA SUSTENNA) injection  156 mg  156 mg Intramuscular Once McNew, Holly R, MD      . paliperidone (INVEGA) 24 hr tablet 9 mg  9 mg Oral Daily McNew, Holly R, MD      . traZODone (DESYREL) tablet 100 mg  100 mg Oral QHS PRN Clapacs, Jackquline Denmark, MD   100 mg at 03/27/18 2145    Lab Results: No results found for this or any previous visit (from the past 48 hour(s)).  Blood Alcohol level:  Lab Results  Component Value Date   ETH <10 03/25/2018   ETH <10 12/06/2017    Metabolic Disorder Labs: Lab Results  Component Value Date   HGBA1C 5.4 03/25/2018   MPG 108.28 03/25/2018   No results found for: PROLACTIN Lab Results  Component Value Date   CHOL 184 03/25/2018   TRIG 43 03/25/2018   HDL 50 03/25/2018   CHOLHDL 3.7 03/25/2018   VLDL 9 03/25/2018   LDLCALC 125 (H) 03/25/2018    Physical Findings: AIMS: Facial and Oral Movements Muscles of Facial Expression: None, normal Lips and Perioral Area: None, normal Jaw: None, normal Tongue: None, normal,Extremity Movements Upper (arms, wrists, hands, fingers): None, normal Lower (legs, knees, ankles, toes): None, normal, Trunk Movements Neck, shoulders, hips: None, normal, Overall Severity Severity of abnormal movements (highest score from questions above): None, normal Incapacitation due to abnormal movements: None, normal Patient's awareness of abnormal movements (rate only patient's report): No Awareness, Dental Status Current problems with teeth and/or dentures?: No Does patient usually wear dentures?: No  CIWA:  CIWA-Ar Total: 0 COWS:  COWS Total Score: 0  Musculoskeletal: Strength & Muscle Tone: within normal limits Gait & Station: normal Patient leans:   Psychiatric Specialty Exam: Physical Exam  Nursing note and vitals reviewed.   ROS  Blood pressure (!) 107/59, pulse 81, temperature 98 F (36.7 C), temperature source Oral, resp. rate 16, height 5\' 8"  (1.727 m), weight 58.1 kg, SpO2 100 %.Body mass index is 19.46 kg/m.  General Appearance:  Disheveled  Eye Contact:  Minimal  Speech:  Slow  Volume:  Decreased  Mood:  anxious  Affect:  blunted  Thought Process:  Disorganized  Orientation:  Full (Time, Place, and Person)  Thought Content:  Paranoid Ideation, AH  Suicidal Thoughts:  No  Homicidal Thoughts:  No  Memory:  Immediate;   Poor  Judgement:  Impaired  Insight:  Lacking  Psychomotor Activity:  Normal  Concentration:  Concentration: Poor  Recall:  Poor  Fund of Knowledge:  Poor  Language:  Fair  Akathisia:  No      Assets:  Resilience  ADL's:  Intact  Cognition:  WNL  Sleep:  Number of Hours: 5.15     Treatment Plan Summary: Daily contact with patient to assess and evaluate symptoms and progress in treatment and Medication management. Pt still psychotic, refusing meds.  Schizoaffective disorder -He was given Hinda GlatterINvega Sustenna 234 mg on 15th . He will get 156 mg next week. Cont oral invega. - Depakote 1000 mg BID. LFT wnl Will have IM prn Haldol available for agitation -EKG pending  Dispo- per primary team-He may return to group home as long as he has ACT team. CSW working on referral.   Beverly SessionsJagannath Lynzi Meulemans, MD 03/28/2018, 3:38 PM

## 2018-03-29 NOTE — [facility] Group Notes (Signed)
LCSW Group Therapy Note 03/29/2018 1:15pm  Type of Therapy and Topic: Group Therapy: Feelings Around Returning Home & Establishing a Supportive Framework and Supporting Oneself When Supports Not Available  Participation Level: Did Not Attend  Description of Group:  Patients first processed thoughts and feelings about upcoming discharge. These included fears of upcoming changes, lack of change, new living environments, judgements and expectations from others and overall stigma of mental health issues. The group then discussed the definition of a supportive framework, what that looks and feels like, and how do to discern it from an unhealthy non-supportive network. The group identified different types of supports as well as what to do when your family/friends are less than helpful or unavailable  Therapeutic Goals  1. Patient will identify one healthy supportive network that they can use at discharge. 2. Patient will identify one factor of a supportive framework and how to tell it from an unhealthy network. 3. Patient able to identify one coping skill to use when they do not have positive supports from others. 4. Patient will demonstrate ability to communicate their needs through discussion and/or role plays.  Summary of Patient Progress:  Pt was invited to attend group but chose not to attend. CSW will continue to encourage pt to attend group throughout their admission.   Therapeutic Modalities Cognitive Behavioral Therapy Motivational Interviewing   Sanvika Cuttino  CUEBAS-COLON, LCSW 03/29/2018 12:45 PM 

## 2018-03-29 NOTE — Progress Notes (Signed)
Patient stayed in room for the majority of the shift. Continues to experience episodes of paranoia and delusions. Did not program in HS activities but was able to go to the dayroom for snack. Guarded and and not engaged in conversations. Continues to report that he did not need to come here, that there is nothing wrong with him. Compliant with medications. Went to bed after having a snack and currently sleeping. No sign of distress. Staff continue to monitor for safety.

## 2018-03-29 NOTE — Plan of Care (Signed)
Continues to have episodes of paranoia/suspiciousness. Did not program in evening activities. In room, keeps it to self. Denying need for help.

## 2018-03-29 NOTE — Progress Notes (Signed)
[facility] MD Progress Note  03/29/2018 4:26 PM David Davila.  MRN:  [id number] Subjective:    37 yo male admitted due to severe agitation and aggression at his group home. Pt lying in his bed,  Minimally verbal, gives yes /no answers, endorses depression. , pt talking to himself at times,disorganized. Took meds .  Principal Problem: Schizophrenia (HCC) Diagnosis:   Patient Active Problem List   Diagnosis Date Noted  . Schizophrenia (HCC) [F20.9] 03/26/2018  . TBI (traumatic brain injury) (HCC) [S06.9X9A] 07/10/2016  . Vitamin B12 deficiency [E53.8] 07/10/2016  . Noncompliance with medication regimen [Z91.14] 06/25/2016  . Cocaine use disorder, mild, abuse (HCC) [F14.10] 06/25/2016  . Cannabis use disorder, mild, abuse [F12.10] 06/25/2016  . Paranoid schizophrenia (HCC) [F20.0] 06/24/2016   Total Time spent with patient: 25 min  Past Psychiatric History: no new info  Past Medical History:  Past Medical History:  Diagnosis Date  . Acute psychosis (HCC)   . Antisocial personality disorder (HCC)   . Cannabis abuse    Past  . Paranoid schizophrenia (HCC)   . Schizoaffective disorder, bipolar type (HCC)    History reviewed. No pertinent surgical history. Family History: History reviewed. No pertinent family history. Family Psychiatric  History: no new info Social History:  Social History   Substance and Sexual Activity  Alcohol Use No     Social History   Substance and Sexual Activity  Drug Use Not Currently    Social History   Socioeconomic History  . Marital status: Single    Spouse name: Not on file  . Number of children: Not on file  . Years of education: Not on file  . Highest education level: Not on file  Occupational History  . Not on file  Social Needs  . Financial resource strain: Not on file  . Food insecurity:    Worry: Not on file    Inability: Not on file  . Transportation needs:    Medical: Not on file    Non-medical: Not on file  Tobacco  Use  . Smoking status: Former Smoker    Packs/day: 1.00    Types: Cigarettes  . Smokeless tobacco: Never Used  Substance and Sexual Activity  . Alcohol use: No  . Drug use: Not Currently  . Sexual activity: Not Currently  Lifestyle  . Physical activity:    Days per week: Not on file    Minutes per session: Not on file  . Stress: Not on file  Relationships  . Social connections:    Talks on phone: Not on file    Gets together: Not on file    Attends religious service: Not on file    Active member of club or organization: Not on file    Attends meetings of clubs or organizations: Not on file    Relationship status: Not on file  Other Topics Concern  . Not on file  Social History Narrative  . Not on file   Additional Social History:                         Sleep: Fair  Appetite:  Fair  Current Medications: Current Facility-Administered Medications  Medication Dose Route Frequency Provider Last Rate Last Dose  . acetaminophen (TYLENOL) tablet 650 mg  650 mg Oral Q6H PRN Clapacs, John T, MD      . alum & mag hydroxide-simeth (MAALOX/MYLANTA) 200-200-20 MG/5ML suspension 30 mL  30 mL Oral Q4H PRN Clapacs,  Jackquline DenmarkJohn T, MD      . divalproex (DEPAKOTE) DR tablet 1,000 mg  1,000 mg Oral Q12H Clapacs, John T, MD   1,000 mg at 03/29/18 1000  . haloperidol (HALDOL) tablet 5 mg  5 mg Oral Q6H PRN McNew, Ileene HutchinsonHolly R, MD       Or  . haloperidol lactate (HALDOL) injection 5 mg  5 mg Intramuscular Q6H PRN McNew, Holly R, MD      . hydrOXYzine (ATARAX/VISTARIL) tablet 50 mg  50 mg Oral TID PRN Clapacs, Jackquline DenmarkJohn T, MD   50 mg at 03/27/18 2145  . LORazepam (ATIVAN) tablet 1 mg  1 mg Oral Q6H PRN Haskell RilingMcNew, Holly R, MD   1 mg at 03/27/18 2145   Or  . LORazepam (ATIVAN) injection 1 mg  1 mg Intramuscular Q6H PRN McNew, Holly R, MD      . magnesium hydroxide (MILK OF MAGNESIA) suspension 30 mL  30 mL Oral Daily PRN Clapacs, Jackquline DenmarkJohn T, MD      . David Davila[START ON 04/03/2018] paliperidone (INVEGA SUSTENNA)  injection 156 mg  156 mg Intramuscular Once McNew, Holly R, MD      . paliperidone (INVEGA) 24 hr tablet 9 mg  9 mg Oral Daily McNew, Ileene HutchinsonHolly R, MD   9 mg at 03/29/18 0829  . traZODone (DESYREL) tablet 100 mg  100 mg Oral QHS PRN Clapacs, Jackquline DenmarkJohn T, MD   100 mg at 03/27/18 2145    Lab Results:  Results for orders placed or performed during the hospital encounter of 03/26/18 (from the past 48 hour(s))  Valproic acid level     Status: None   Collection Time: 03/28/18  8:47 PM  Result Value Ref Range   Valproic Acid Lvl 67 50.0 - 100.0 ug/mL    Comment: Performed at [facility], [address] [place], [place] [address]    Blood Alcohol level:  Lab Results  Component Value Date   [facility] <10 03/25/2018   ETH <10 12/06/2017    Metabolic Disorder Labs: Lab Results  Component Value Date   HGBA1C 5.4 03/25/2018   MPG 108.28 03/25/2018   No results found for: PROLACTIN Lab Results  Component Value Date   CHOL 184 03/25/2018   TRIG 43 03/25/2018   HDL 50 03/25/2018   CHOLHDL 3.7 03/25/2018   VLDL 9 03/25/2018   LDLCALC 125 (H) 03/25/2018    Physical Findings: AIMS: Facial and Oral Movements Muscles of Facial Expression: None, normal Lips and Perioral Area: None, normal Jaw: None, normal Tongue: None, normal,Extremity Movements Upper (arms, wrists, hands, fingers): None, normal Lower (legs, knees, ankles, toes): None, normal, Trunk Movements Neck, shoulders, hips: None, normal, Overall Severity Severity of abnormal movements (highest score from questions above): None, normal Incapacitation due to abnormal movements: None, normal Patient's awareness of abnormal movements (rate only patient's report): No Awareness, Dental Status Current problems with teeth and/or dentures?: No Does patient usually wear dentures?: No  CIWA:  CIWA-Ar Total: 0 COWS:  COWS Total Score: 0  Musculoskeletal: Strength & Muscle Tone: within normal limits Gait & Station: normal Patient leans:    Psychiatric Specialty Exam: Physical Exam  Nursing note and vitals reviewed.   ROS  Blood pressure (!) 107/59, pulse 81, temperature 98 F (36.7 C), temperature source Oral, resp. rate 16, height 5\' 8"  (1.727 m), weight 58.1 kg, SpO2 100 %.Body mass index is 19.46 kg/m.  General Appearance: Disheveled  Eye Contact:  Minimal  Speech:  Slow, minimal  Volume:  Decreased  Mood:  anxious  Affect:  blunted  Thought Process:  Disorganized  Orientation:  Full (Time, Place, and Person)  Thought Content:  Paranoid Ideation, AH  Suicidal Thoughts:  No  Homicidal Thoughts:  No  Memory:  Immediate;   Poor  Judgement:  Impaired  Insight:  Lacking  Psychomotor Activity:  Normal  Concentration:  Concentration: Poor  Recall:  Poor  Fund of Knowledge:  Poor  Language:  Fair  Akathisia:  No      Assets:  Resilience  ADL's:  Intact  Cognition:  WNL  Sleep:  Number of Hours: 5.15     Treatment Plan Summary: Daily contact with patient to assess and evaluate symptoms and progress in treatment and Medication management. Pt still psychotic.  Schizoaffective disorder -He was given Hinda GlatterINvega Sustenna 234 mg on 15th . He will get 156 mg next week. Cont oral invega. - Depakote 1000 mg BID. LFT wnl Will have IM prn Haldol available for agitation -EKG pending  Dispo- per primary team-He may return to group home as long as he has ACT team. CSW working on referral.   Beverly SessionsJagannath Emmalee Solivan, MD 03/29/2018, 4:26 PMPatient ID: David MullerPaul Stephenson Vizzini Jr., male   DOB: 12/10/1980, 37 y.o.   MRN: [id number]

## 2018-03-29 NOTE — Progress Notes (Signed)
Patient was pacing in the hallway, flat and guarded. Denying need for treatment "I don't know why I am here, nothing wrong with me". Was seen in HS group but was just standing in the dayroom, starring at staff with bizarre behaviors. Patient received HS medications and did not have any major concern. Currently in bed resting. Staff continue to monitor for safety and other needs.

## 2018-03-29 NOTE — Plan of Care (Signed)
Compliant with treatment but saying that he did not need to be here. "nothing wrong with me"

## 2018-03-29 NOTE — Plan of Care (Signed)
D: Pt denies SI/HI/AV hallucinations. Pt was alert and verbal. Patient was somewhat upset this morning and seeking an answer for when he will be discharged home. Patient reassured that this writer will try and find out an estimated time he will be discharged. A: Pt was offered support and encouragement. Pt was given scheduled medications. Pt was encourage to attend groups. Q 15 minute checks were done for safety.  R:Pt attends groups and interacts well with peers and staff. Pt is taking medication. Pt has no complaints.Pt receptive to treatment and safety maintained on unit.    Problem: Education: Goal: Will be free of psychotic symptoms Outcome: Progressing   Problem: Coping: Goal: Will verbalize feelings Outcome: Progressing   Problem: Health Behavior/Discharge Planning: Goal: Compliance with prescribed medication regimen will improve Outcome: Progressing   Problem: Safety: Goal: Ability to remain free from injury will improve Outcome: Progressing

## 2018-03-29 NOTE — [facility] Group Notes (Signed)
[facility] Group Notes:  (Nursing/MHT/Case Management/Adjunct)  Date:  03/29/2018  Time:  12:22 AM  Type of Therapy:  Group Therapy  Participation Level:  Did Not Attend    David NeighborsKeith D Goldman Davila 03/29/2018, 12:22 AM

## 2018-03-30 NOTE — Progress Notes (Signed)
Patient ID: David Mulleraul Stephenson Bueche Jr., male   DOB: 07/08/1981, 89 y.o.   MRN: [id number] Per State regulations 482.30 this chart was reviewed for medical necessity with respect to the patient's admission/duration of stay.    Next review date: 04/03/18  Thurman CoyerEric Marius Betts, BSN, RN-BC  Case Manager

## 2018-03-30 NOTE — [facility] Group Notes (Signed)
[facility] Group Notes:  (Nursing/MHT/Case Management/Adjunct)  Date:  03/30/2018  Time:  10:05 PM  Type of Therapy:  Group Therapy  Participation Level:  Did Not Attend  Summary of Progress/Problems:  Mayra NeerJackie L Janitza Revuelta 03/30/2018, 10:05 PM

## 2018-03-30 NOTE — Progress Notes (Signed)
Recreation Therapy Notes  Date: 03/30/2018  Time: 9:30 am   Location: Craft Room   Behavioral response: N/A   Intervention Topic: Stress  Discussion/Intervention: Patient did not attend group.   Clinical Observations/Feedback:  Patient did not attend group.   Raelle Chambers LRT/CTRS        David Davila 03/30/2018 11:46 AM 

## 2018-03-30 NOTE — B[facility] Counselor (Signed)
CSW spoke with Nelma RothmanSandra Reiman, [facility] Care Coordinator [phone]).   She reports that he has been at his current group home for a while. He does have an habit of walking away from his group homes. They began the process for [employer] for his own apartment. She says that he would need to get stable before he can do anything more with that. She says that he had his own place but had it filled with drug dealers and prostitutes. She says that he then walked away from there. He has been [facility] one other [facility] this [facility]. She says that before that he was in jail for a little bit and stayed there for awhile because he wouldn't tell them his information. She spoke with [facility] in [place] and left a message. Lorne Skeens( Laura Browler [phone]) [facility] [place]  She says that they have been trying to get him an Investment banker, operationalACTT Team for awhile. [facility] doesn't serve that area and neither dose [facility]. CSW will continue to work will all treatment providers.    CSW spoke with Building surveyorJacquelyn Coover at [facility] [place]. She says that she will come and meet with the patient tomorrow at 8am to interview him for an ACTT team. She says that they dont accept just medicare. They will run his insurance and meet with him if he does have medicaid and medicare. She will call the CSW back to verify.     Johny Shearsassandra Yolander Goodie, MSW, Theresia MajorsLCSWA, Bridget HartshornLCASA Clinical Social Worker 03/30/2018 1:15 PM

## 2018-03-30 NOTE — BHH Counselor (Signed)
CSW sent off ACTT team referral to [facility].  Johny Shearsassandra Jahzir Strohmeier, MSW, Theresia MajorsLCSWA, Bridget HartshornLCASA Clinical Social Worker 03/30/2018 10:24 AM
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
## 2018-03-30 NOTE — Progress Notes (Signed)
Received David Davila this AM, asleep in bed, he was awaken for medications. He refused to come to the medication room and did not acknowledge when this writer offer to bring them to his room. He took his Depakote, but refused the [place]. He stated I was trying to lethal inject him because he took the  [place] injection. He continued to take his meals in the dining room throughout the day.

## 2018-03-31 MED ORDER — BENZTROPINE MESYLATE 1 MG/ML IJ SOLN: 1.0000 mg | Freq: Two times a day (BID) | INTRAMUSCULAR | Status: DC | PRN

## 2018-03-31 MED ORDER — BENZTROPINE MESYLATE 1 MG PO TABS
1.0000 mg | ORAL_TABLET | Freq: Two times a day (BID) | ORAL | Status: DC | PRN
  Administered 2018-04-08: 1 mg via ORAL
  Filled 2018-03-31: qty 1

## 2018-03-31 NOTE — Progress Notes (Signed)
Recreation Therapy Notes   Date: 03/31/2018  Time: 9:30 am   Location: Craft Room   Behavioral response: N/A   Intervention Topic: Self-Care  Discussion/Intervention: Patient did not attend group.   Clinical Observations/Feedback:  Patient did not attend group.   Alisandra Son LRT/CTRS        Emmilia Sowder 03/31/2018 10:49 AM 

## 2018-03-31 NOTE — Plan of Care (Signed)
Odd and bizarre, disheveled, responding to internal stimuli; no aggressive behaviors but guarded, suspicious, & vigilant. Medication compliant.  Patient slept for Estimated Hours of 7.30; Precautionary checks every 15 minutes for safety maintained, room free of safety hazards, patient sustains no injury or falls during this shift.  Problem: Education: Goal: Emotional status will improve Outcome: Progressing   Problem: Activity: Goal: Sleeping patterns will improve Outcome: Progressing   Problem: Health Behavior/Discharge Planning: Goal: Compliance with treatment plan for underlying cause of condition will improve Outcome: Progressing   Problem: Physical Regulation: Goal: Ability to maintain clinical measurements within normal limits will improve Outcome: Progressing   Problem: Safety: Goal: Periods of time without injury will increase Outcome: Progressing   Problem: Safety: Goal: Ability to remain free from injury will improve Outcome: Progressing

## 2018-03-31 NOTE — [facility] Group Notes (Signed)
03/31/2018 1PM  Type of Therapy/Topic:  Group Therapy:  Feelings about Diagnosis  Participation Level:  Did Not Attend   Description of Group:   This group will allow patients to explore their thoughts and feelings about diagnoses they have received. Patients will be guided to explore their level of understanding and acceptance of these diagnoses. Facilitator will encourage patients to process their thoughts and feelings about the reactions of others to their diagnosis and will guide patients in identifying ways to discuss their diagnosis with significant others in their lives. This group will be process-oriented, with patients participating in exploration of their own experiences, giving and receiving support, and processing challenge from other group members.   Therapeutic Goals: 1. Patient will demonstrate understanding of diagnosis as evidenced by identifying two or more symptoms of the disorder 2. Patient will be able to express two feelings regarding the diagnosis 3. Patient will demonstrate their ability to communicate their needs through discussion and/or role play  Summary of Patient Progress: Patient was encouraged and invited to attend group. Patient did not attend group. Social worker will continue to encourage group participation in the future.        Therapeutic Modalities:   Cognitive Behavioral Therapy Brief Therapy Feelings Identification    Harlie Buening, LCSW 03/31/2018 2:33 PM  
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
## 2018-04-01 MED ORDER — PALIPERIDONE PALMITATE ER 156 MG/ML IM SUSY
156.0000 mg | PREFILLED_SYRINGE | Freq: Once | INTRAMUSCULAR | Status: AC
  Administered 2018-04-02: 156 mg via INTRAMUSCULAR
  Filled 2018-04-01: qty 1

## 2018-04-01 NOTE — Tx Team (Signed)
Interdisciplinary Treatment and Diagnostic Plan Update  04/01/2018 Time of Session: 11am David Mulleraul Stephenson David Jr. MRN: [id number]  Principal Diagnosis: Schizophrenia [facility](HCC)  Secondary Diagnoses: Principal Problem:   Schizophrenia (HCC)   Current Medications:  Current Facility-Administered Medications  Medication Dose Route Frequency Provider Last Rate Last Dose  . acetaminophen (TYLENOL) tablet 650 mg  650 mg Oral Q6H PRN Clapacs, John T, MD      . alum & mag hydroxide-simeth (MAALOX/MYLANTA) 200-200-20 MG/5ML suspension 30 mL  30 mL Oral Q4H PRN Clapacs, John T, MD      . benztropine (COGENTIN) tablet 1 mg  1 mg Oral BID PRN McNew, Ileene HutchinsonHolly R, MD       Or  . benztropine mesylate (COGENTIN) injection 1 mg  1 mg Intramuscular BID PRN McNew, Holly R, MD      . divalproex (DEPAKOTE) DR tablet 1,000 mg  1,000 mg Oral Q12H Clapacs, John T, MD   1,000 mg at 04/01/18 [id number]  . haloperidol (HALDOL) tablet 5 mg  5 mg Oral Q6H PRN McNew, Ileene HutchinsonHolly R, MD       Or  . haloperidol lactate (HALDOL) injection 5 mg  5 mg Intramuscular Q6H PRN McNew, Ileene HutchinsonHolly R, MD   5 mg at 04/01/18 0819  . hydrOXYzine (ATARAX/VISTARIL) tablet 50 mg  50 mg Oral TID PRN Clapacs, Jackquline DenmarkJohn T, MD   50 mg at 03/27/18 2145  . LORazepam (ATIVAN) tablet 1 mg  1 mg Oral Q6H PRN Haskell RilingMcNew, Holly R, MD   1 mg at 03/27/18 2145   Or  . LORazepam (ATIVAN) injection 1 mg  1 mg Intramuscular Q6H PRN Haskell RilingMcNew, Holly R, MD   1 mg at 04/01/18 0820  . magnesium hydroxide (MILK OF MAGNESIA) suspension 30 mL  30 mL Oral Daily PRN Clapacs, Jackquline DenmarkJohn T, MD      . David Davila[START ON 04/02/2018] paliperidone (INVEGA SUSTENNA) injection 156 mg  156 mg Intramuscular Once McNew, Holly R, MD      . paliperidone (INVEGA) 24 hr tablet 9 mg  9 mg Oral Daily McNew, Ileene HutchinsonHolly R, MD   9 mg at 04/01/18 0811  . traZODone (DESYREL) tablet 100 mg  100 mg Oral QHS PRN Clapacs, Jackquline DenmarkJohn T, MD   100 mg at 03/31/18 2003   PTA Medications: Medications Prior to Admission  Medication Sig Dispense Refill  Last Dose  . atorvastatin (LIPITOR) 10 MG tablet Take 1 tablet (10 mg total) by mouth daily at 6 PM. (Patient not taking: Reported on 11/16/2017) 30 tablet 0 Not Taking at Unknown time  . divalproex (DEPAKOTE ER) 500 MG 24 hr tablet Take 1,000 mg by mouth daily.   unknown at unknown  . hydrOXYzine (ATARAX/VISTARIL) 25 MG tablet Take 1 tablet (25 mg total) by mouth 3 (three) times daily. (Patient not taking: Reported on 11/16/2017) 30 tablet 0 Not Taking at Unknown time  . lamoTRIgine (LAMICTAL) 100 MG tablet Take 1 tablet (100 mg total) by mouth every evening. (Patient not taking: Reported on 11/16/2017) 30 tablet 0 Not Taking at Unknown time  . lamoTRIgine (LAMICTAL) 25 MG tablet Take 2 tablets (50 mg total) by mouth daily. (Patient not taking: Reported on 11/16/2017) 60 tablet 0 Not Taking at Unknown time  . loratadine (CLARITIN) 10 MG tablet Take 10 mg by mouth daily.   unknown at unknown  . LORazepam (ATIVAN) 0.5 MG tablet Take 1 tablet (0.5 mg total) by mouth 2 (two) times daily. (Patient not taking: Reported on 11/16/2017) 60 tablet 0 Not Taking  at Unknown time  . OLANZapine zydis (ZYPREXA) 5 MG disintegrating tablet Take 1 tablet (5 mg total) by mouth at bedtime. (Patient not taking: Reported on 11/16/2017) 30 tablet 0 Not Taking at Unknown time  . paliperidone (INVEGA SUSTENNA) 156 MG/ML SUSP injection Inject 1 mL (156 mg total) into the muscle every 28 (twenty-eight) days. NEXT DOSE DUE August 13, 2016 (Patient taking differently: Inject 156 mg into the muscle every 28 (twenty-eight) days. ) 0.9 mL 0   . traZODone (DESYREL) 100 MG tablet Take 1 tablet (100 mg total) by mouth at bedtime. (Patient taking differently: Take 50 mg by mouth at bedtime. ) 30 tablet 0 Not Taking at Unknown time    Patient Stressors: Medication change or noncompliance Substance abuse  Patient Strengths: General fund of knowledge  Treatment Modalities: Medication Management, Group therapy, Case management,  1 to 1 session with  clinician, Psychoeducation, Recreational therapy.   Physician Treatment Plan for Primary Diagnosis: Schizophrenia (HCC) Long Term Goal(s): Improvement in symptoms so as ready for discharge   Short Term Goals: Ability to identify changes in lifestyle to reduce recurrence of condition will improve  Medication Management: Evaluate patient's response, side effects, and tolerance of medication regimen.  Therapeutic Interventions: 1 to 1 sessions, Unit Group sessions and Medication administration.  Evaluation of Outcomes: Progressing  Physician Treatment Plan for Secondary Diagnosis: Principal Problem:   Schizophrenia (HCC)  Long Term Goal(s): Improvement in symptoms so as ready for discharge   Short Term Goals: Ability to identify changes in lifestyle to reduce recurrence of condition will improve     Medication Management: Evaluate patient's response, side effects, and tolerance of medication regimen.  Therapeutic Interventions: 1 to 1 sessions, Unit Group sessions and Medication administration.  Evaluation of Outcomes: Progressing   RN Treatment Plan for Primary Diagnosis: Schizophrenia (HCC) Long Term Goal(s): Knowledge of disease and therapeutic regimen to maintain health will improve  Short Term Goals: Ability to verbalize feelings will improve, Ability to identify and develop effective coping behaviors will improve and Compliance with prescribed medications will improve  Medication Management: RN will administer medications as ordered by provider, will assess and evaluate patient's response and provide education to patient for prescribed medication. RN will report any adverse and/or side effects to prescribing provider.  Therapeutic Interventions: 1 on 1 counseling sessions, Psychoeducation, Medication administration, Evaluate responses to treatment, Monitor vital signs and CBGs as ordered, Perform/monitor CIWA, COWS, AIMS and Fall Risk screenings as ordered, Perform wound care  treatments as ordered.  Evaluation of Outcomes: Progressing   LCSW Treatment Plan for Primary Diagnosis: Schizophrenia (HCC) Long Term Goal(s): Safe transition to appropriate next level of care at discharge, Engage patient in therapeutic group addressing interpersonal concerns.  Short Term Goals: Engage patient in aftercare planning with referrals and resources, Increase social support, Increase emotional regulation, Identify triggers associated with mental health/substance abuse issues and Increase skills for wellness and recovery  Therapeutic Interventions: Assess for all discharge needs, 1 to 1 time with Social worker, Explore available resources and support systems, Assess for adequacy in community support network, Educate family and significant other(s) on suicide prevention, Complete Psychosocial Assessment, Interpersonal group therapy.  Evaluation of Outcomes: Progressing   Progress in Treatment: Attending groups: No. Participating in groups: No. Taking medication as prescribed: Yes. Toleration medication: Yes. Family/Significant other contact made: Yes, individual(s) contacted:  Patients Guardian Patient understands diagnosis: Yes. Discussing patient identified problems/goals with staff: Yes. Medical problems stabilized or resolved: Yes. Denies suicidal/homicidal ideation: Yes. Issues/concerns per patient  self-inventory: No. Other:   New problem(s) identified: No, Describe:  None  New Short Term/Long Term Goal(s): "Take care of my family."  Patient Goals:   "Take care of my family."  Discharge Plan or Barriers: To discharge back to his group home and work with an Investment banker, operationalACTT Team.  Reason for Continuation of Hospitalization: Medication stabilization  Estimated Length of Stay: 7 days Recreational Therapy: Patient Stressors: N/A Patient Goal: Patient will engage in interactions with peers and staff in pro-social manner at least 2x within 5 recreation therapy group  sessions   Attendees: Patient:  04/01/2018 11:38 AM  Physician: Corinna GabHolly McNew, MD 04/01/2018 11:38 AM  Nursing: Hulan AmatoGwen Farrish, RN 04/01/2018 11:38 AM  RN Care Manager: 04/01/2018 11:38 AM  Social Worker: Johny Shearsassandra Skylan Lara, LCSWA 04/01/2018 11:38 AM  Recreational Therapist:  04/01/2018 11:38 AM  Other:  04/01/2018 11:38 AM  Other: Huey RomansSonya Carter, LCSW 04/01/2018 11:38 AM  Other: 04/01/2018 11:38 AM    Scribe for Treatment Team: Johny Shearsassandra  Robin Pafford, LCSW 04/01/2018 11:38 AM

## 2018-04-01 NOTE — Progress Notes (Signed)
Patient refused EKG.

## 2018-04-01 NOTE — Progress Notes (Addendum)
Received [name] this AM in his room asleep, he continued to sleep and woke up after noon. He paced the hallway, went to the dayroom and returned to his room without incident. He is presently sleeping. He remained calm this PM and in his room asleep. His labs were rescheduled for tomorrow morning. He was approached regarding an EKG, the conversation was redirected to having an eating disorder and the outcome if the person never eats.Then he left to eat his dinner.
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
## 2018-04-01 NOTE — Plan of Care (Signed)
Disheveled still, still responding to internal stimuli, observed briefly out of his room at the the start of the shift; out for snack and returned to room; 2-RN supervision during medication at bedside; evasive, dismissive, hostile, guarded and highly unpredictable.  Patient slept for Estimated Hours of 9; Precautionary checks every 15 minutes for safety maintained, room free of safety hazards, patient sustains no injury or falls during this shift.  Problem: Activity: Goal: Sleeping patterns will improve Outcome: Progressing   Problem: Safety: Goal: Periods of time without injury will increase Outcome: Progressing   Problem: Education: Goal: Knowledge of the prescribed therapeutic regimen will improve Outcome: Progressing   Problem: Safety: Goal: Ability to remain free from injury will improve Outcome: Progressing   Problem: Education: Goal: Emotional status will improve Outcome: Not Progressing Goal: Mental status will improve Outcome: Not Progressing   Problem: Activity: Goal: Interest or engagement in activities will improve Outcome: Not Progressing   Problem: Coping: Goal: Ability to verbalize frustrations and anger appropriately will improve Outcome: Not Progressing Goal: Ability to demonstrate self-control will improve Outcome: Not Progressing   Problem: Education: Goal: Will be free of psychotic symptoms Outcome: Not Progressing
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
## 2018-04-01 NOTE — [facility] Group Notes (Signed)
[facility] Group Notes:  (Nursing/MHT/Case Management/Adjunct)  Date:  04/01/2018  Time:  9:30 PM  Type of Therapy:  Group Therapy  Participation Level:  Did Not Attend    David NeighborsKeith D Jowan Davila 04/01/2018, 9:30 PM

## 2018-04-01 NOTE — [facility] Group Notes (Signed)
LCSW Group Therapy Note  04/01/2018 1:00pm  Type of Therapy/Topic:  Group Therapy:  Emotion Regulation  Participation Level:  Did Not Attend   Description of Group:    The purpose of this group is to assist patients in learning to regulate negative emotions and experience positive emotions. Patients will be guided to discuss ways in which they have been vulnerable to their negative emotions. These vulnerabilities will be juxtaposed with experiences of positive emotions or situations, and patients will be challenged to use positive emotions to combat negative ones. Special emphasis will be placed on coping with negative emotions in conflict situations, and patients will process healthy conflict resolution skills.  Therapeutic Goals: 1. Patient will identify two positive emotions or experiences to reflect on in order to balance out negative emotions 2. Patient will label two or more emotions that they find the most difficult to experience 3. Patient will demonstrate positive conflict resolution skills through discussion and/or role plays  Summary of Patient Progress:  David Davila was invited to today's group, but chose not to attend.     Therapeutic Modalities:   Cognitive Behavioral Therapy Feelings Identification Dialectical Behavioral Therapy

## 2018-04-02 LAB — CBC WITH DIFFERENTIAL/PLATELET
BASOS ABS: 0 10*3/uL (ref 0–0.1)
Basophils Relative: 1 %
Eosinophils Absolute: 0.1 10*3/uL (ref 0–0.7)
Eosinophils Relative: 3 %
HEMATOCRIT: 36.9 % — AB (ref 40.0–52.0)
Hemoglobin: 12.7 g/dL — ABNORMAL LOW (ref 13.0–18.0)
LYMPHS ABS: 1 10*3/uL (ref 1.0–3.6)
LYMPHS PCT: 37 %
MCH: 31.2 pg (ref 26.0–34.0)
MCHC: 34.3 g/dL (ref 32.0–36.0)
MCV: 90.9 fL (ref 80.0–100.0)
MONO ABS: 0.4 10*3/uL (ref 0.2–1.0)
MONOS PCT: 15 %
NEUTROS ABS: 1.2 10*3/uL — AB (ref 1.4–6.5)
Neutrophils Relative %: 44 %
Platelets: 151 10*3/uL (ref 150–440)
RBC: 4.07 MIL/uL — ABNORMAL LOW (ref 4.40–5.90)
RDW: 13.9 % (ref 11.5–14.5)
WBC: 2.6 10*3/uL — ABNORMAL LOW (ref 3.8–10.6)

## 2018-04-02 LAB — VALPROIC ACID LEVEL: VALPROIC ACID LVL: 123 ug/mL — AB (ref 50.0–100.0)

## 2018-04-02 NOTE — [facility] Group Notes (Signed)
[facility] Group Notes:  (Nursing/MHT/Case Management/Adjunct)  Date:  04/02/2018  Time:  11:25 PM  Type of Therapy:  Group Therapy  Participation Level:  Did Not Attend    David NeighborsKeith D Koby Pickup 04/02/2018, 11:25 PM

## 2018-04-02 NOTE — Progress Notes (Addendum)
[facility] MD Progress Note  04/02/2018 12:36 PM [name]  MRN:  [id number] Subjective:  Pt had no episodes of agitation or aggression overnight or today. She willingly allowed for an EKG and to have blood work done later in the morning. He willingly received 2nd [place] injection this morning with no outbursts. He ate in the day room with peers. He states that he had a good lunch and "had collared greens with vinegar. It wasn't exactly right but it was good." Upon evaluation today, he is much less irritable. He states, "I'm doing pretty well." He states that he is "arguing with the voices." He states that "they are telling me to take the plea deal in a murder trial." He redirects well. HE is able to name some of his medications he is taking. He states, "I'm on Risperdal, Invega and something else." He states that the medications are helping him but he is unable to state what they help with. He denies any muscle stiffness and there is no stiffness noted on exam.   Principal Problem: Schizophrenia (HCC) Diagnosis:   Patient Active Problem List   Diagnosis Date Noted  . Schizophrenia (HCC) [F20.9] 03/26/2018    Priority: High  . TBI (traumatic brain injury) (HCC) [S06.9X9A] 07/10/2016  . Vitamin B12 deficiency [E53.8] 07/10/2016  . Noncompliance with medication regimen [Z91.14] 06/25/2016  . Cocaine use disorder, mild, abuse (HCC) [F14.10] 06/25/2016  . Cannabis use disorder, mild, abuse [F12.10] 06/25/2016  . 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 Acute psychosis (HCC)   . Antisocial personality disorder (HCC)   . Cannabis abuse    Past  . Paranoid schizophrenia (HCC)   . Schizoaffective disorder, bipolar type (HCC)    History reviewed. No pertinent surgical history. Family History: History reviewed. No pertinent  family history. Family Psychiatric  History: See h&P Social History:  Social History   Substance and Sexual Activity  Alcohol Use No     Social History   Substance and Sexual Activity  Drug Use Not Currently    Social History   Socioeconomic History  . Marital status: Single    Spouse name: Not on file  . Number of children: Not on file  . Years of education: Not on file  . Highest education level: Not on file  Occupational History  . Not on file  Social Needs  . Financial resource strain: Not on file  . Food insecurity:    Worry: Not on file    Inability: Not on file  . Transportation needs:    Medical: Not on file    Non-medical: Not on file  Tobacco Use  . Smoking status: Former Smoker    Packs/day: 1.00    Types: Cigarettes  . Smokeless tobacco: Never Used  Substance and Sexual Activity  . Alcohol use: No  . Drug use: Not Currently  . Sexual activity: Not Currently  Lifestyle  . Physical activity:    Days per week: Not on file    Minutes per session: Not on file  . Stress: Not on file  Relationships  . Social connections:    Talks on phone: Not on file    Gets together: Not on file    Attends religious service: Not on file    Active member of club or organization: Not on file  Attends meetings of clubs or organizations: Not on file    Relationship status: Not on file  Other Topics Concern  . Not on file  Social History Narrative  . Not on file   Additional Social History:                         Sleep: Good  Appetite:  Good  Current Medications: Current Facility-Administered Medications  Medication Dose Route Frequency Provider Last Rate Last Dose  . acetaminophen (TYLENOL) tablet 650 mg  650 mg Oral Q6H PRN Clapacs, John T, MD      . alum & mag hydroxide-simeth (MAALOX/MYLANTA) 200-200-20 MG/5ML suspension 30 mL  30 mL Oral Q4H PRN Clapacs, John T, MD      . benztropine (COGENTIN) tablet 1 mg  1 mg Oral BID PRN McNew, Ileene Hutchinson, MD        Or  . benztropine mesylate (COGENTIN) injection 1 mg  1 mg Intramuscular BID PRN McNew, Holly R, MD      . divalproex (DEPAKOTE) DR tablet 1,000 mg  1,000 mg Oral Q12H Clapacs, John T, MD   1,000 mg at 04/02/18 2130  . haloperidol (HALDOL) tablet 5 mg  5 mg Oral Q6H PRN McNew, Ileene Hutchinson, MD       Or  . haloperidol lactate (HALDOL) injection 5 mg  5 mg Intramuscular Q6H PRN McNew, Ileene Hutchinson, MD   5 mg at 04/01/18 0819  . hydrOXYzine (ATARAX/VISTARIL) tablet 50 mg  50 mg Oral TID PRN Clapacs, Jackquline Denmark, MD   50 mg at 03/27/18 2145  . LORazepam (ATIVAN) tablet 1 mg  1 mg Oral Q6H PRN Haskell Riling, MD   1 mg at 03/27/18 2145   Or  . LORazepam (ATIVAN) injection 1 mg  1 mg Intramuscular Q6H PRN Haskell Riling, MD   1 mg at 04/01/18 0820  . magnesium hydroxide (MILK OF MAGNESIA) suspension 30 mL  30 mL Oral Daily PRN Clapacs, Jackquline Denmark, MD      . traZODone (DESYREL) tablet 100 mg  100 mg Oral QHS PRN Clapacs, Jackquline Denmark, MD   100 mg at 03/31/18 2003    Lab Results:  Results for orders placed or performed during the hospital encounter of 03/26/18 (from the past 48 hour(s))  Valproic acid level     Status: Abnormal   Collection Time: 04/02/18 10:12 AM  Result Value Ref Range   Valproic Acid Lvl 123 (H) 50.0 - 100.0 ug/mL    Comment: Performed at [facility], [address] [place], [place] [address]  CBC with Differential/Platelet     Status: Abnormal   Collection Time: 04/02/18 10:12 AM  Result Value Ref Range   WBC 2.6 (L) 3.8 - 10.6 K/uL   RBC 4.07 (L) 4.40 - 5.90 MIL/uL   Hemoglobin 12.7 (L) 13.0 - 18.0 g/dL   HCT 46.9 (L) 62.9 - 52.8 %   MCV 90.9 80.0 - 100.0 fL   MCH 31.2 26.0 - 34.0 pg   MCHC 34.3 32.0 - 36.0 g/dL   RDW 41.3 24.4 - 01.0 %   Platelets 151 150 - 440 K/uL   Neutrophils Relative % 44 %   Neutro Abs 1.2 (L) 1.4 - 6.5 K/uL   Lymphocytes Relative 37 %   Lymphs Abs 1.0 1.0 - 3.6 K/uL   Monocytes Relative 15 %   Monocytes Absolute 0.4 0.2 - 1.0 K/uL   Eosinophils  Relative 3 %  Eosinophils Absolute 0.1 0 - 0.7 K/uL   Basophils Relative 1 %   Basophils Absolute 0.0 0 - 0.1 K/uL    Comment: Performed at [facility], [address] [place], [place] [address]    Blood Alcohol level:  Lab Results  Component Value Date   [facility] <10 03/25/2018   ETH <10 12/06/2017    Metabolic Disorder Labs: Lab Results  Component Value Date   HGBA1C 5.4 03/25/2018   MPG 108.28 03/25/2018   No results found for: PROLACTIN Lab Results  Component Value Date   CHOL 184 03/25/2018   TRIG 43 03/25/2018   HDL 50 03/25/2018   CHOLHDL 3.7 03/25/2018   VLDL 9 03/25/2018   LDLCALC 125 (H) 03/25/2018    Physical Findings: AIMS: Facial and Oral Movements Muscles of Facial Expression: None, normal Lips and Perioral Area: None, normal Jaw: None, normal Tongue: None, normal,Extremity Movements Upper (arms, wrists, hands, fingers): None, normal Lower (legs, knees, ankles, toes): None, normal, Trunk Movements Neck, shoulders, hips: None, normal, Overall Severity Severity of abnormal movements (highest score from questions above): None, normal Incapacitation due to abnormal movements: None, normal Patient's awareness of abnormal movements (rate only patient's report): No Awareness, Dental Status Current problems with teeth and/or dentures?: No Does patient usually wear dentures?: No  CIWA:  CIWA-Ar Total: 0 COWS:  COWS Total Score: 0  Musculoskeletal: Strength & Muscle Tone: within normal limits Gait & Station: normal Patient leans: N/A  Psychiatric Specialty Exam: Physical Exam  Nursing note and vitals reviewed.   Review of Systems  All other systems reviewed and are negative.   Blood pressure 106/65, pulse 78, temperature 97.8 F (36.6 C), temperature source Oral, resp. rate 18, height 5\' 8"  (1.727 m), weight 58.1 kg, SpO2 95 %.Body mass index is 19.46 kg/m.  General Appearance: Casual  Eye Contact:  Fair  Speech:  Clear and Coherent   Volume:  Normal  Mood:  Euthymic  Affect:  Congruent  Thought Process:  Disorganized  Orientation:  Full (Time, Place, and Person)  Thought Content:  Illogical and Hallucinations: Auditory  Suicidal Thoughts:  No  Homicidal Thoughts:  No  Memory:  Immediate;   Poor  Judgement:  Impaired  Insight:  Lacking  Psychomotor Activity:  Normal  Concentration:  Concentration: Poor  Recall:  Poor  Fund of Knowledge:  Fair  Language:  Fair  Akathisia:  No      Assets:  Resilience  ADL's:  Intact  Cognition:  WNL  Sleep:  Number of Hours: 7.5     Treatment Plan Summary: 37 yo male admitted due to psychosis and aggression. He willingly took second dose of Invega this morning. He later also willingly allowed for EKG and blood work. He does have neutropenia which it appears he has had intermittently in the past. Could likely be from [place]. He is afebrile. Unfortunately, he would not be a clozapine candidate due to this unless he stops Invega and ANC improves. Psychosis has stabilized well in the past on Invega but may attempt another antipsychotic in a months times to see if neutropenia improves.   Plan:  Schizoaffective disorder -He received second [place] injection 156 mg today. -Decrease Depakote to 1000 mg qam and 1750 mg qhs. Depakote level 123 however, this was not trough level today so likely falsely elevated but will decrease dose slightly -EKG shows QTc of 427  Neutropenia -ANC 1.2 -Pt had normal WBC (ANC not checked) prior to getting Invega in the ED.  Likely due to Invega injection. He did also receive some Haldol injections, as well which could cause it. He has had intermittent Neutropenia in the past but also has been normal even when on Invega.[name]. He may need to try alternative anti-psychotic in the future but CBC can be monitored outpatient.  Will monitor for any fevers and recheck CBC in a few days  Dispo -He will return to group home when stable. Attempted to reach  his guardian again today but went directly to voicemail Haskell RilingHolly R McNew, MD 04/02/2018, 12:36 PM

## 2018-04-02 NOTE — BHH Group Notes (Signed)
LCSW Group Therapy Note  04/02/2018 1:00pm  Type of Therapy/Topic:  Group Therapy:  Balance in Life  Participation Level:  Did Not Attend  Description of Group:    This group will address the concept of balance and how it feels and looks when one is unbalanced. Patients will be encouraged to process areas in their lives that are out of balance and identify reasons for remaining unbalanced. Facilitators will guide patients in utilizing problem-solving interventions to address and correct the stressor making their life unbalanced. Understanding and applying boundaries will be explored and addressed for obtaining and maintaining a balanced life. Patients will be encouraged to explore ways to assertively make their unbalanced needs known to significant others in their lives, using other group members and facilitator for support and feedback.  Therapeutic Goals: 1. Patient will identify two or more emotions or situations they have that consume much of in their lives. 2. Patient will identify signs/triggers that life has become out of balance:  3. Patient will identify two ways to set boundaries in order to achieve balance in their lives:  4. Patient will demonstrate ability to communicate their needs through discussion and/or role plays  Summary of Patient Progress:      Therapeutic Modalities:   Cognitive Behavioral Therapy Solution-Focused Therapy Assertiveness Training  Alease FrameSonya S Dola Lunsford, LCSW 04/02/2018 4:40 PM

## 2018-04-02 NOTE — BHH Counselor (Signed)
CSW spoke with Merri RayJacquelyn Coover [phone]), from [facility] in [place], [place]. CSW was told that they are no longer accepting clients as of yesterday. CSW was told to try [facility] in [place].   CSW called [facility] in [place]. They are accepting new clients. She will send over a referral form for me to complete and send back with additional patient information. She reports that they may have [employer] funds on a single-case agreement, she will double check. They will also run his insurance to see if he has medicaid. CSW will complete the form and fax it back to them today.  Johny Shearsassandra Angalena Cousineau, MSW, Theresia MajorsLCSWA, Bridget HartshornLCASA Clinical Social Worker 04/02/2018 11:27 AM

## 2018-04-02 NOTE — Progress Notes (Signed)
Recreation Therapy Notes  Date: 04/02/2018  Time: 9:30 am   Location: Craft Room   Behavioral response: N/A   Intervention Topic: Happiness  Discussion/Intervention: Patient did not attend group.   Clinical Observations/Feedback:  Patient did not attend group.   Jarold Macomber LRT/CTRS        David Davila 04/02/2018 11:22 AM 

## 2018-04-02 NOTE — Plan of Care (Signed)
Patient is calm and cooperative today.Visible in the milieu,minimal interactions with peers.Denies SI,HI and AVH.Patient stated that he had an argument with someone at the group home.Receptive with staff,no issues verbalized.Compliant with medications.Did not attend groups.Support and encouragement given.

## 2018-04-02 NOTE — Progress Notes (Addendum)
Patient was in room for the majority of the time. Guarded and not willing to talk to staff. Giving short answers. Denied SI/HI. Had snack and HS medications. No sign of distress. Currently in bed sleeping. Staff continue to monitor for safety. [id number]: Patient  Slept all night and appeared to be comfortable in bed. Woke up in the morning, was reluctant on getting EKG but accepted per encouragements. Currently visible in the milieu, with improved mood but continues to respond to internal stimuli. Staff continue to provide support and encouragements. Safety precautions maintained.

## 2018-04-02 NOTE — BHH Counselor (Signed)
CSW faxed referral with requested paperwork to [facility].   Johny Shearsassandra Maleaha Hughett, MSW, Theresia MajorsLCSWA, Bridget HartshornLCASA Clinical Social Worker 04/02/2018 12:37 PM

## 2018-04-02 NOTE — BHH Counselor (Signed)
CSW attempted to contact the patients guardian David Davila to provide her with a update. CSW left a voicemail with a callback number.   Johny Shearsassandra Elizar Alpern, MSW, Theresia MajorsLCSWA, Bridget HartshornLCASA Clinical Social Worker 04/02/2018 4:13 PM

## 2018-04-03 ENCOUNTER — Encounter: Payer: Self-pay | Admitting: Psychiatry

## 2018-04-03 MED ORDER — DIVALPROEX SODIUM 500 MG PO DR TAB
750.0000 mg | DELAYED_RELEASE_TABLET | Freq: Every day | ORAL | Status: DC
  Administered 2018-04-03 – 2018-05-17 (×45): 750 mg via ORAL
  Filled 2018-04-03 (×44): qty 1

## 2018-04-03 MED ORDER — DIVALPROEX SODIUM 500 MG PO DR TAB
1000.0000 mg | DELAYED_RELEASE_TABLET | ORAL | Status: DC
  Administered 2018-04-04 – 2018-05-18 (×39): 1000 mg via ORAL
  Filled 2018-04-03 (×42): qty 2

## 2018-04-03 NOTE — BHH Counselor (Signed)
CSW was informed by [facility] ACTT team that they do not serve [place]. The [facility] ACTT Team in [place] report that they are no longer taking new patients. She says that they will follow up with the patient and his guardian in 1-2 months.   CSW spoke with the patients guardian Concepcion Elkatasha Scott to update her of the patients progress, ACTT referral status and discharge plans. Marcelle Smilingatasha reports that she will inform the group home. She thinks that it should not be a problem with him going back there even if he has to wait on the ACTT team while he's there. The group home is just concerned about him taking his medications and may be comforted that his referral in the process. CSW will continue to coordinate care with the patients treatment team.  Johny Shearsassandra Encarnacion Bole, MSW, Bryon LionsLCSWA, LCASA Clinical Social Worker 04/03/2018 2:57 PM

## 2018-04-03 NOTE — Progress Notes (Addendum)
[facility] MD Progress Note  04/04/2018 6:13 PM [name]  MRN:  [id number]  Subjective:   Mr. Manninen came to my office for interview. He is cool and collected but rather distracted by his voices, looking around, having difficulties answering questions. He can not explain how he ended up in the hospital and is unable to participate in discharge planning. He is from [place].but would prefer to "avoid any unnecessary conflict".  Principal Problem: Undifferentiated schizophrenia (HCC) Diagnosis:   Patient Active Problem List   Diagnosis Date Noted  . Undifferentiated schizophrenia (HCC) [F20.3] 03/26/2018    Priority: High  . TBI (traumatic brain injury) (HCC) [S06.9X9A] 07/10/2016  . Vitamin B12 deficiency [E53.8] 07/10/2016  . Noncompliance with medication regimen [Z91.14] 06/25/2016  . Cocaine use disorder, mild, abuse (HCC) [F14.10] 06/25/2016  . Cannabis use disorder, mild, abuse [F12.10] 06/25/2016  . Paranoid schizophrenia (HCC) [F20.0] 06/24/2016   Total Time spent with patient: 20 minutes  Past Psychiatric History: schizophrenia   Past Medical History:  Past Medical History:  Diagnosis Date  . Acute psychosis (HCC)   . Antisocial personality disorder (HCC)   . Cannabis abuse    Past  . Paranoid schizophrenia (HCC)   . Schizoaffective disorder, bipolar type (HCC)    History reviewed. No pertinent surgical history. Family History: History reviewed. No pertinent family history. Family Psychiatric  History: unknown Social History:  Social History   Substance and Sexual Activity  Alcohol Use No     Social History   Substance and Sexual Activity  Drug Use Not Currently    Social History   Socioeconomic History  . Marital status: Single    Spouse name: Not on file  . Number of children: Not on file  . Years of education: Not on file  . Highest education level: Not on file  Occupational History  . Not on file  Social Needs  . Financial resource  strain: Not on file  . Food insecurity:    Worry: Not on file    Inability: Not on file  . Transportation needs:    Medical: Not on file    Non-medical: Not on file  Tobacco Use  . Smoking status: Former Smoker    Packs/day: 1.00    Types: Cigarettes  . Smokeless tobacco: Never Used  Substance and Sexual Activity  . Alcohol use: No  . Drug use: Not Currently  . Sexual activity: Not Currently  Lifestyle  . Physical activity:    Days per week: Not on file    Minutes per session: Not on file  . Stress: Not on file  Relationships  . Social connections:    Talks on phone: Not on file    Gets together: Not on file    Attends religious service: Not on file    Active member of club or organization: Not on file    Attends meetings of clubs or organizations: Not on file    Relationship status: Not on file  Other Topics Concern  . Not on file  Social History Narrative  . Not on file   Additional Social History:                         Sleep: Fair  Appetite:  Fair  Current Medications: Current Facility-Administered Medications  Medication Dose Route Frequency Provider Last Rate Last Dose  . acetaminophen (TYLENOL) tablet 650 mg  650 mg Oral Q6H PRN Clapacs, Jackquline Denmark, MD      .  alum & mag hydroxide-simeth (MAALOX/MYLANTA) 200-200-20 MG/5ML suspension 30 mL  30 mL Oral Q4H PRN Clapacs, John T, MD      . benztropine (COGENTIN) tablet 1 mg  1 mg Oral BID PRN McNew, Ileene Hutchinson, MD       Or  . benztropine mesylate (COGENTIN) injection 1 mg  1 mg Intramuscular BID PRN McNew, Holly R, MD      . divalproex (DEPAKOTE) DR tablet 1,000 mg  1,000 mg Oral BH-q7a McNew, Holly R, MD   1,000 mg at 04/04/18 0645   And  . divalproex (DEPAKOTE) DR tablet 750 mg  750 mg Oral QHS McNew, Holly R, MD   750 mg at 04/03/18 2214  . haloperidol (HALDOL) tablet 5 mg  5 mg Oral Q6H PRN McNew, Ileene Hutchinson, MD       Or  . haloperidol lactate (HALDOL) injection 5 mg  5 mg Intramuscular Q6H PRN McNew, Ileene Hutchinson, MD   5 mg at 04/01/18 0819  . hydrOXYzine (ATARAX/VISTARIL) tablet 50 mg  50 mg Oral TID PRN Clapacs, Jackquline Denmark, MD   50 mg at 04/03/18 2214  . LORazepam (ATIVAN) tablet 1 mg  1 mg Oral Q6H PRN Haskell Riling, MD   1 mg at 04/03/18 2214   Or  . LORazepam (ATIVAN) injection 1 mg  1 mg Intramuscular Q6H PRN Haskell Riling, MD   1 mg at 04/01/18 0820  . magnesium hydroxide (MILK OF MAGNESIA) suspension 30 mL  30 mL Oral Daily PRN Clapacs, John T, MD      . traZODone (DESYREL) tablet 100 mg  100 mg Oral QHS PRN Clapacs, Jackquline Denmark, MD   100 mg at 04/03/18 2214    Lab Results:  No results found for this or any previous visit (from the past 48 hour(s)).  Blood Alcohol level:  Lab Results  Component Value Date   ETH <10 03/25/2018   ETH <10 12/06/2017    Metabolic Disorder Labs: Lab Results  Component Value Date   HGBA1C 5.4 03/25/2018   MPG 108.28 03/25/2018   No results found for: PROLACTIN Lab Results  Component Value Date   CHOL 184 03/25/2018   TRIG 43 03/25/2018   HDL 50 03/25/2018   CHOLHDL 3.7 03/25/2018   VLDL 9 03/25/2018   LDLCALC 125 (H) 03/25/2018    Physical Findings: AIMS: Facial and Oral Movements Muscles of Facial Expression: None, normal Lips and Perioral Area: None, normal Jaw: None, normal Tongue: None, normal,Extremity Movements Upper (arms, wrists, hands, fingers): None, normal Lower (legs, knees, ankles, toes): None, normal, Trunk Movements Neck, shoulders, hips: None, normal, Overall Severity Severity of abnormal movements (highest score from questions above): None, normal Incapacitation due to abnormal movements: None, normal Patient's awareness of abnormal movements (rate only patient's report): No Awareness, Dental Status Current problems with teeth and/or dentures?: No Does patient usually wear dentures?: No  CIWA:  CIWA-Ar Total: 0 COWS:  COWS Total Score: 0  Musculoskeletal: Strength & Muscle Tone: within normal limits Gait & Station:  normal Patient leans: N/A  Psychiatric Specialty Exam: Physical Exam  Nursing note and vitals reviewed. Psychiatric: His affect is blunt. His speech is delayed. He is withdrawn and actively hallucinating. Thought content is paranoid. Cognition and memory are normal. He expresses impulsivity.    Review of Systems  Neurological: Negative.   Psychiatric/Behavioral: Positive for hallucinations.  All other systems reviewed and are negative.   Blood pressure 105/70, pulse 91, temperature 97.8 F (36.6 C),  temperature source Oral, resp. rate 18, height 5\' 8"  (1.727 m), weight 58.1 kg, SpO2 100 %.Body mass index is 19.46 kg/m.  General Appearance: Casual  Eye Contact:  Poor  Speech:  Slow  Volume:  Decreased  Mood:  Depressed  Affect:  Blunt  Thought Process:  Disorganized and Descriptions of Associations: Tangential  Orientation:  Other:  person and place  Thought Content:  Hallucinations: Auditory  Suicidal Thoughts:  No  Homicidal Thoughts:  No  Memory:  Immediate;   Poor Recent;   Poor Remote;   Poor  Judgement:  Poor  Insight:  Lacking  Psychomotor Activity:  Normal  Concentration:  Concentration: Poor and Attention Span: Poor  Recall:  Poor  Fund of Knowledge:  Poor  Language:  Poor  Akathisia:  No  Handed:  Right  AIMS (if indicated):     Assets:  Communication Skills Desire for Improvement Physical Health Resilience  ADL's:  Intact  Cognition:  WNL  Sleep:  Number of Hours: 7.75     Treatment Plan Summary: Daily contact with patient to assess and evaluate symptoms and progress in treatment and Medication management   Mr. Shirlee LatchMcLean is a 37 yo male admitted due to psychosis and aggression with group home staff. He had aggression on initial admission but has been calm and pleasant the past few days. He states that the voices are better but does not elaborate much today. He mostly stays to himself.   Plan:  Schizoaffective disorder -Continue lower dose of Depakote  1000 mg qam and 750 mg qpm. Depakote level was 123 but this was not trough level so likely falsely elevated -He received both initial injections of Invega.  -Have considered clozapine but ANC is low so this is not an option at this time.  -May need second antipsychotic but need to watch CBC  Neutropenia -This will need to be monitored outpatient and if still low should consider alternative anti-psychotic -Will recheck this next week and monitor for any fevers  Dispo -he will return to group home when stable. There is only one ACT team that serves his area but they are not taking new patients at this time  Kristine LineaJolanta Marshaun Lortie, MD 04/04/2018, 6:13 PM

## 2018-04-03 NOTE — [facility] Group Notes (Signed)
04/03/2018 1PM  Type of Therapy and Topic:  Group Therapy:  Feelings around Relapse and Recovery  Participation Level:  Did Not Attend   Description of Group:    Patients in this group will discuss emotions they experience before and after a relapse. They will process how experiencing these feelings, or avoidance of experiencing them, relates to having a relapse. Facilitator will guide patients to explore emotions they have related to recovery. Patients will be encouraged to process which emotions are more powerful. They will be guided to discuss the emotional reaction significant others in their lives may have to patients' relapse or recovery. Patients will be assisted in exploring ways to respond to the emotions of others without this contributing to a relapse.  Therapeutic Goals: 1. Patient will identify two or more emotions that lead to a relapse for them 2. Patient will identify two emotions that result when they relapse 3. Patient will identify two emotions related to recovery 4. Patient will demonstrate ability to communicate their needs through discussion and/or role plays   Summary of Patient Progress: Patient was encouraged and invited to attend group. Patient did not attend group. Social worker will continue to encourage group participation in the future.      Therapeutic Modalities:   Cognitive Behavioral Therapy Solution-Focused Therapy Assertiveness Training Relapse Prevention Therapy   Johny ShearsCassandra  Keoni Risinger, LCSW 04/03/2018 2:30 PM

## 2018-04-03 NOTE — Progress Notes (Signed)
D: Patient alert and oriented x 4, he denies SI/HI/AVH but noted responding to internal stimuli. Patient is pleasant  This evening and he cooperated  with treatment after various cues ans encouragement. Patient's thoughts is disorganized, speech incoherent at times but non pressured, he appears less anxious and he is not interacting with peers and staff appropriately.  A: Patient was offered support and encouragement, and was given scheduled medications.Patient was also encouraged to attend groups, and 15 minute checks were maintained  for safety.  R:Patient did not attend evening group, he is however complaint with his medication. .Safety maintained on unit, will continue to monitor closely.

## 2018-04-03 NOTE — Progress Notes (Signed)
Patient ID: David Mulleraul Stephenson Guillen Jr., male   DOB: 02/08/1981, 37 y.o.   MRN: [id number] PER STATE REGULATIONS 482.30  THIS CHART WAS REVIEWED FOR MEDICAL NECESSITY WITH RESPECT TO THE PATIENT'S ADMISSION/DURATION OF STAY.  NEXT REVIEW DATE:04/07/18  Loura HaltBARBARA Perel Hauschild, RN, BSN CASE MANAGER

## 2018-04-03 NOTE — Progress Notes (Addendum)
[facility]  04/03/2018 1:34 PM David MullerPaul Stephenson Borjon Jr.  MRN:  [id number] Subjective:  Per RN reports, he has been pleasant overnight. He has been cooperative. He was noted to be responding to internal stimuli. He appears less anxious. Upon evaluation today, he is calm. He states taht he is feeling alright. HE states that the voices are better and is not hearing them currently. He denies SI< HI. He was asked orientation questions but states that he does not want to answer and then "states I don't want to talk anymore right now." HE did not want to answer further questions.   Principal Problem: Schizophrenia (HCC) Diagnosis:   Patient Active Problem List   Diagnosis Date Noted  . Schizophrenia (HCC) [F20.9] 03/26/2018    Priority: High  . TBI (traumatic brain injury) (HCC) [S06.9X9A] 07/10/2016  . Vitamin B12 deficiency [E53.8] 07/10/2016  . Noncompliance with medication regimen [Z91.14] 06/25/2016  . Cocaine use disorder, mild, abuse (HCC) [F14.10] 06/25/2016  . Cannabis use disorder, mild, abuse [F12.10] 06/25/2016  . Paranoid schizophrenia (HCC) [F20.0] 06/24/2016   Total Time spent with patient: 20 minutes  Past Psychiatric History: See h&P  Past Medical History:  Past Medical History:  Diagnosis Date  . Acute psychosis (HCC)   . Antisocial personality disorder (HCC)   . Cannabis abuse    Past  . Paranoid schizophrenia (HCC)   . Schizoaffective disorder, bipolar type (HCC)    History reviewed. No pertinent surgical history. Family History: History reviewed. No pertinent family history. Family Psychiatric  History: See h&P Social History:  Social History   Substance and Sexual Activity  Alcohol Use No     Social History   Substance and Sexual Activity  Drug Use Not Currently    Social History   Socioeconomic History  . Marital status: Single    Spouse name: Not on file  . Number of children: Not on file  . Years of education: Not on file  . Highest  education level: Not on file  Occupational History  . Not on file  Social Needs  . Financial resource strain: Not on file  . Food insecurity:    Worry: Not on file    Inability: Not on file  . Transportation needs:    Medical: Not on file    Non-medical: Not on file  Tobacco Use  . Smoking status: Former Smoker    Packs/day: 1.00    Types: Cigarettes  . Smokeless tobacco: Never Used  Substance and Sexual Activity  . Alcohol use: No  . Drug use: Not Currently  . Sexual activity: Not Currently  Lifestyle  . Physical activity:    Days per week: Not on file    Minutes per session: Not on file  . Stress: Not on file  Relationships  . Social connections:    Talks on phone: Not on file    Gets together: Not on file    Attends religious service: Not on file    Active member of club or organization: Not on file    Attends meetings of clubs or organizations: Not on file    Relationship status: Not on file  Other Topics Concern  . Not on file  Social History Narrative  . Not on file   Additional Social History:                         Sleep: Good  Appetite:  Good  Current Medications: Current  Facility-Administered Medications  Medication Dose Route Frequency Provider Last Rate Last Dose  . acetaminophen (TYLENOL) tablet 650 mg  650 mg Oral Q6H PRN Clapacs, John T, MD      . alum & mag hydroxide-simeth (MAALOX/MYLANTA) 200-200-20 MG/5ML suspension 30 mL  30 mL Oral Q4H PRN Clapacs, John T, MD      . benztropine (COGENTIN) tablet 1 mg  1 mg Oral BID PRN Kaori Jumper, Ileene Hutchinson, MD       Or  . benztropine mesylate (COGENTIN) injection 1 mg  1 mg Intramuscular BID PRN Haskell Riling, MD      . David Davila ON 04/04/2018] divalproex (DEPAKOTE) DR tablet 1,000 mg  1,000 mg Oral BH-q7a Eliora Nienhuis, Ileene Hutchinson, MD       And  . divalproex (DEPAKOTE) DR tablet 750 mg  750 mg Oral QHS Raffael Bugarin R, MD      . haloperidol (HALDOL) tablet 5 mg  5 mg Oral Q6H PRN Dashan Chizmar, Ileene Hutchinson, MD       Or  .  haloperidol lactate (HALDOL) injection 5 mg  5 mg Intramuscular Q6H PRN Murdock Jellison, Ileene Hutchinson, MD   5 mg at 04/01/18 0819  . hydrOXYzine (ATARAX/VISTARIL) tablet 50 mg  50 mg Oral TID PRN Clapacs, Jackquline Denmark, MD   50 mg at 04/02/18 2111  . LORazepam (ATIVAN) tablet 1 mg  1 mg Oral Q6H PRN Haskell Riling, MD   1 mg at 04/02/18 2110   Or  . LORazepam (ATIVAN) injection 1 mg  1 mg Intramuscular Q6H PRN Haskell Riling, MD   1 mg at 04/01/18 0820  . magnesium hydroxide (MILK OF MAGNESIA) suspension 30 mL  30 mL Oral Daily PRN Clapacs, Jackquline Denmark, MD      . traZODone (DESYREL) tablet 100 mg  100 mg Oral QHS PRN Clapacs, Jackquline Denmark, MD   100 mg at 04/02/18 2111    Lab Results:  Results for orders placed or performed during the hospital encounter of 03/26/18 (from the past 48 hour(s))  Valproic acid level     Status: Abnormal   Collection Time: 04/02/18 10:12 AM  Result Value Ref Range   Valproic Acid Lvl 123 (H) 50.0 - 100.0 ug/mL    Comment: Performed at [facility], [address] [place], [place] [address]  CBC with Differential/Platelet     Status: Abnormal   Collection Time: 04/02/18 10:12 AM  Result Value Ref Range   WBC 2.6 (L) 3.8 - 10.6 K/uL   RBC 4.07 (L) 4.40 - 5.90 MIL/uL   Hemoglobin 12.7 (L) 13.0 - 18.0 g/dL   HCT 32.4 (L) 40.1 - 02.7 %   MCV 90.9 80.0 - 100.0 fL   MCH 31.2 26.0 - 34.0 pg   MCHC 34.3 32.0 - 36.0 g/dL   RDW 25.3 66.4 - 40.3 %   Platelets 151 150 - 440 K/uL   Neutrophils Relative % 44 %   Neutro Abs 1.2 (L) 1.4 - 6.5 K/uL   Lymphocytes Relative 37 %   Lymphs Abs 1.0 1.0 - 3.6 K/uL   Monocytes Relative 15 %   Monocytes Absolute 0.4 0.2 - 1.0 K/uL   Eosinophils Relative 3 %   Eosinophils Absolute 0.1 0 - 0.7 K/uL   Basophils Relative 1 %   Basophils Absolute 0.0 0 - 0.1 K/uL    Comment: Performed at [facility], [address], [place], [place] [address]    Blood Alcohol level:  Lab Results  Component  Value Date   ETH <10 03/25/2018   ETH <10  12/06/2017    Metabolic Disorder Labs: Lab Results  Component Value Date   HGBA1C 5.4 03/25/2018   MPG 108.28 03/25/2018   No results found for: PROLACTIN Lab Results  Component Value Date   CHOL 184 03/25/2018   TRIG 43 03/25/2018   HDL 50 03/25/2018   CHOLHDL 3.7 03/25/2018   VLDL 9 03/25/2018   LDLCALC 125 (H) 03/25/2018    Physical Findings: AIMS: Facial and Oral Movements Muscles of Facial Expression: None, normal Lips and Perioral Area: None, normal Jaw: None, normal Tongue: None, normal,Extremity Movements Upper (arms, wrists, hands, fingers): None, normal Lower (legs, knees, ankles, toes): None, normal, Trunk Movements Neck, shoulders, hips: None, normal, Overall Severity Severity of abnormal movements (highest score from questions above): None, normal Incapacitation due to abnormal movements: None, normal Patient's awareness of abnormal movements (rate only patient's report): No Awareness, Dental Status Current problems with teeth and/or dentures?: No Does patient usually wear dentures?: No  CIWA:  CIWA-Ar Total: 0 COWS:  COWS Total Score: 0  Musculoskeletal: Strength & Muscle Tone: within normal limits Gait & Station: normal Patient leans: N/A  Psychiatric Specialty Exam: Physical Exam  Nursing note and vitals reviewed.   Review of Systems  All other systems reviewed and are negative.   Blood pressure 105/70, pulse 91, temperature 97.8 F (36.6 C), temperature source Oral, resp. rate 18, height 5\' 8"  (1.727 m), weight 58.1 kg, SpO2 100 %.Body mass index is 19.46 kg/m.  General Appearance: Casual  Eye Contact:  Minimal  Speech:  Clear and Coherent  Volume:  Normal  Mood:  Euthymic  Affect:  Labile  Thought Process:  Disorganized but better  Orientation:  Pt refused to answer  Thought Content:  Illogical  Suicidal Thoughts:  No  Homicidal Thoughts:  No  Memory:  Immediate;   Poor  Judgement:  Impaired  Insight:  Lacking  Psychomotor Activity:   Normal  Concentration:  Concentration: Poor  Recall:  Poor  Fund of Knowledge:  Poor  Language:  Poor  Akathisia:  No      Assets:  Resilience  ADL's:  Intact  Cognition:  WNL  Sleep:  Number of Hours: 7.75     Treatment Plan Summary: 37 yo male admitted due to psychosis and aggression with group home staff. He had aggression on initial admission but has been calm and pleasant the past few days. He states that the voices are better but does not elaborate much today. He mostly stays to himself.   Plan:  Schizoaffective disorder -Continue lower dose of Depakote 1000 mg qam and 750 mg qpm. Depakote level was 123 but this was not trough level so likely falsely elevated -He received both initial injections of Invega.  -Have considered clozapine but ANC is low so this is not an option at this time.  -May need second antipsychotic but need to watch CBC  Neutropenia -This will need to be monitored outpatient and if still low should consider alternative anti-psychotic -Will recheck this next week and monitor for any fevers  Dispo -he will return to group home when stable. There is only one ACT team that serves his area but they are not taking new patients at this time Haskell Riling, MD 04/03/2018, 1:34 PM

## 2018-04-03 NOTE — [facility] Group Notes (Signed)
[facility] Group Notes:  (Nursing/MHT/Case Management/Adjunct)  Date:  04/03/2018  Time:  11:18 PM  Type of Therapy:  Group Therapy  Participation Level:  Did Not Attend   David NeighborsKeith D Tres Davila 04/03/2018, 11:18 PM

## 2018-04-03 NOTE — Plan of Care (Signed)
Patient denies SI, patient is interacting with internal stimuli. Patient is in milieu pacing, laughing to self, speaking with self, fix smile and glaring throughout the day. Patient is fixated on the race and gender of staff. Patient is compliant with medications. Patient has no complaints today. Safety is maintained on unit.    Problem: Education: Goal: Knowledge of [place] [facility] Education information/materials will improve Outcome: Not Progressing Goal: Emotional status will improve Outcome: Not Progressing Goal: Mental status will improve Outcome: Not Progressing Goal: Verbalization of understanding the information provided will improve Outcome: Not Progressing   Problem: Activity: Goal: Interest or engagement in activities will improve Outcome: Not Progressing   Problem: Coping: Goal: Ability to verbalize frustrations and anger appropriately will improve Outcome: Not Progressing Goal: Ability to demonstrate self-control will improve Outcome: Not Progressing   Problem: Physical Regulation: Goal: Ability to maintain clinical measurements within normal limits will improve Outcome: Not Progressing

## 2018-04-03 NOTE — Plan of Care (Signed)
  Problem: Coping: Goal: Ability to verbalize frustrations and anger appropriately will improve Outcome: Progressing  Patient verbalized frustrations and anger to staff.  

## 2018-04-04 DIAGNOSIS — F203 Undifferentiated schizophrenia: Principal | ICD-10-CM

## 2018-04-04 NOTE — Progress Notes (Signed)
[facility]  04/05/2018 2:08 PM David MullerPaul Stephenson [name]  MRN:  [id number]  Subjective:   David Davila has no complaints today. He is in bed while everybody elso is outside enjoying nice weather. He seems hallucinating, inattentive, responding to internal stimuli.   Principal Problem: Undifferentiated schizophrenia (HCC) Diagnosis:   Patient Active Problem List   Diagnosis Date Noted  . Undifferentiated schizophrenia (HCC) [F20.3] 03/26/2018    Priority: High  . TBI (traumatic brain injury) (HCC) [S06.9X9A] 07/10/2016  . Vitamin B12 deficiency [E53.8] 07/10/2016  . Noncompliance with medication regimen [Z91.14] 06/25/2016  . Cocaine use disorder, mild, abuse (HCC) [F14.10] 06/25/2016  . Cannabis use disorder, mild, abuse [F12.10] 06/25/2016  . Paranoid schizophrenia (HCC) [F20.0] 06/24/2016   Total Time spent with patient: 15 minutes  Past Psychiatric History: schizophrenia  Past Medical History:  Past Medical History:  Diagnosis Date  . Acute psychosis (HCC)   . Antisocial personality disorder (HCC)   . Cannabis abuse    Past  . Paranoid schizophrenia (HCC)   . Schizoaffective disorder, bipolar type (HCC)    History reviewed. No pertinent surgical history. Family History: History reviewed. No pertinent family history. Family Psychiatric  History: unknown Social History:  Social History   Substance and Sexual Activity  Alcohol Use No     Social History   Substance and Sexual Activity  Drug Use Not Currently    Social History   Socioeconomic History  . Marital status: Single    Spouse name: Not on file  . Number of children: Not on file  . Years of education: Not on file  . Highest education level: Not on file  Occupational History  . Not on file  Social Needs  . Financial resource strain: Not on file  . Food insecurity:    Worry: Not on file    Inability: Not on file  . Transportation needs:    Medical: Not on file    Non-medical: Not on file   Tobacco Use  . Smoking status: Former Smoker    Packs/day: 1.00    Types: Cigarettes  . Smokeless tobacco: Never Used  Substance and Sexual Activity  . Alcohol use: No  . Drug use: Not Currently  . Sexual activity: Not Currently  Lifestyle  . Physical activity:    Days per week: Not on file    Minutes per session: Not on file  . Stress: Not on file  Relationships  . Social connections:    Talks on phone: Not on file    Gets together: Not on file    Attends religious service: Not on file    Active member of club or organization: Not on file    Attends meetings of clubs or organizations: Not on file    Relationship status: Not on file  Other Topics Concern  . Not on file  Social History Narrative  . Not on file   Additional Social History:                         Sleep: Fair  Appetite:  Fair  Current Medications: Current Facility-Administered Medications  Medication Dose Route Frequency Provider Last Rate Last Dose  . acetaminophen (TYLENOL) tablet 650 mg  650 mg Oral Q6H PRN Clapacs, John T, MD      . alum & mag hydroxide-simeth (MAALOX/MYLANTA) 200-200-20 MG/5ML suspension 30 mL  30 mL Oral Q4H PRN Clapacs, Jackquline DenmarkJohn T, MD      .  benztropine (COGENTIN) tablet 1 mg  1 mg Oral BID PRN McNew, Ileene Hutchinson, MD       Or  . benztropine mesylate (COGENTIN) injection 1 mg  1 mg Intramuscular BID PRN McNew, Holly R, MD      . divalproex (DEPAKOTE) DR tablet 1,000 mg  1,000 mg Oral BH-q7a McNew, Holly R, MD   1,000 mg at 04/05/18 1610   And  . divalproex (DEPAKOTE) DR tablet 750 mg  750 mg Oral QHS McNew, Holly R, MD   750 mg at 04/04/18 2216  . haloperidol (HALDOL) tablet 5 mg  5 mg Oral Q6H PRN McNew, Ileene Hutchinson, MD       Or  . haloperidol lactate (HALDOL) injection 5 mg  5 mg Intramuscular Q6H PRN McNew, Ileene Hutchinson, MD   5 mg at 04/01/18 0819  . hydrOXYzine (ATARAX/VISTARIL) tablet 50 mg  50 mg Oral TID PRN Clapacs, Jackquline Denmark, MD   50 mg at 04/04/18 2216  . LORazepam (ATIVAN)  tablet 1 mg  1 mg Oral Q6H PRN Haskell Riling, MD   1 mg at 04/03/18 2214   Or  . LORazepam (ATIVAN) injection 1 mg  1 mg Intramuscular Q6H PRN Haskell Riling, MD   1 mg at 04/01/18 0820  . magnesium hydroxide (MILK OF MAGNESIA) suspension 30 mL  30 mL Oral Daily PRN Clapacs, John T, MD      . traZODone (DESYREL) tablet 100 mg  100 mg Oral QHS PRN Clapacs, Jackquline Denmark, MD   100 mg at 04/04/18 2216    Lab Results: No results found for this or any previous visit (from the past 48 hour(s)).  Blood Alcohol level:  Lab Results  Component Value Date   ETH <10 03/25/2018   ETH <10 12/06/2017    Metabolic Disorder Labs: Lab Results  Component Value Date   HGBA1C 5.4 03/25/2018   MPG 108.28 03/25/2018   No results found for: PROLACTIN Lab Results  Component Value Date   CHOL 184 03/25/2018   TRIG 43 03/25/2018   HDL 50 03/25/2018   CHOLHDL 3.7 03/25/2018   VLDL 9 03/25/2018   LDLCALC 125 (H) 03/25/2018    Physical Findings: AIMS: Facial and Oral Movements Muscles of Facial Expression: None, normal Lips and Perioral Area: None, normal Jaw: None, normal Tongue: None, normal,Extremity Movements Upper (arms, wrists, hands, fingers): None, normal Lower (legs, knees, ankles, toes): None, normal, Trunk Movements Neck, shoulders, hips: None, normal, Overall Severity Severity of abnormal movements (highest score from questions above): None, normal Incapacitation due to abnormal movements: None, normal Patient's awareness of abnormal movements (rate only patient's report): No Awareness, Dental Status Current problems with teeth and/or dentures?: No Does patient usually wear dentures?: No  CIWA:  CIWA-Ar Total: 0 COWS:  COWS Total Score: 0  Musculoskeletal: Strength & Muscle Tone: within normal limits Gait & Station: normal Patient leans: N/A  Psychiatric Specialty Exam: Physical Exam  Nursing note and vitals reviewed. Psychiatric: His affect is blunt. His speech is delayed. He is  withdrawn and actively hallucinating. Thought content is paranoid. Cognition and memory are impaired. He expresses impulsivity.    Review of Systems  Neurological: Negative.   Psychiatric/Behavioral: Positive for hallucinations.  All other systems reviewed and are negative.   Blood pressure 105/70, pulse 91, temperature 97.8 F (36.6 C), temperature source Oral, resp. rate 18, height 5\' 8"  (1.727 m), weight 58.1 kg, SpO2 100 %.Body mass index is 19.46 kg/m.  General Appearance: Casual  Eye Contact:  Good  Speech:  Slow  Volume:  Decreased  Mood:  Euthymic  Affect:  Flat  Thought Process:  Disorganized and Descriptions of Associations: Tangential  Orientation:  Full (Time, Place, and Person)  Thought Content:  Hallucinations: Auditory  Suicidal Thoughts:  No  Homicidal Thoughts:  No  Memory:  Immediate;   Poor Recent;   Poor Remote;   Poor  Judgement:  Poor  Insight:  Lacking  Psychomotor Activity:  Normal  Concentration:  Concentration: Fair and Attention Span: Poor  Recall:  Poor  Fund of Knowledge:  Poor  Language:  Poor  Akathisia:  No  Handed:  Right  AIMS (if indicated):     Assets:  Communication Skills Desire for Improvement Physical Health Resilience  ADL's:  Intact  Cognition:  WNL  Sleep:  Number of Hours: 5.5     Treatment Plan Summary: Daily contact with patient to assess and evaluate symptoms and progress in treatment and Medication management   David Davila is a 37 yo male admitted due to psychosis and aggression with group home staff. He had aggression on initial admission but has been calm and pleasant the past few days. He states that the voices are better but does not elaborate much today. He mostly stays to himself.   Plan:  Schizoaffective disorder -Continue lower dose of Depakote 1000 mg qam and 750 mg qpm. Depakote level was 123 but this was not trough level so likely falsely elevated -He received both initial injections of Invega.  -Have  considered clozapine but ANC is low so this is not an option at this time.  -May need second antipsychotic but need to watch CBC  Neutropenia -This will need to be monitored outpatient and if still low should consider alternative anti-psychotic -Will recheck this next week and monitor for any fevers  Dispo -he will return to group home when stable. There is only one ACT team that serves his area but they are not taking new patients at this time   Kristine Linea, MD 04/05/2018, 2:08 PM

## 2018-04-04 NOTE — Plan of Care (Signed)
Patient did not complete self inventory for today. Patient denies SI/HI/AVH. Patient is seen pacing in hall way interaction with internal stimuli. Patient has outbursts of laughter periodically. Patient is preoccupied during assessment and forwards little. Patient is racially focused and frequently asks for "african [place] male doctors only." Patient is seen in milieu, but isolates to self. Patient is without complaint of pain. Patient's safety is maintained on the unit.    Problem: Education: Goal: Knowledge of  General Education information/materials will improve Outcome: Not Progressing Goal: Emotional status will improve Outcome: Not Progressing Goal: Mental status will improve Outcome: Not Progressing

## 2018-04-04 NOTE — Progress Notes (Signed)
Patient alert and oriented x 3 with periods of confusion to situation, he denies SI/HI/AVH, his affect is labile appears less anxious no angry outburst, he is interacting  appropriately with peers and staff, complaint with medication Patient was offered support and encouraged to attend evening wrap up group. Patient didn't attend evening wrap up group. 15 minutes safety checks maintained will continue to closely monitor.

## 2018-04-04 NOTE — Plan of Care (Signed)
Patient verbalized importance of rest and activity balance to staff.

## 2018-04-04 NOTE — [facility] Group Notes (Signed)
[facility] LCSW Group Therapy  04/04/2018 1 pm  Group Therapy  Participation Level:  Did Not Attend   Kermit Arnette LCSW [phone]  

## 2018-04-05 NOTE — [facility] Group Notes (Signed)
[facility] Group Notes:  (Nursing/MHT/Case Management/Adjunct)  Date:  04/05/2018  Time:  9:31 PM  Type of Therapy:  Group Therapy  Participation Level:  Did Not Attend   David Davila 04/05/2018, 9:31 PM

## 2018-04-05 NOTE — Plan of Care (Signed)
  Problem: Education: Goal: Emotional status will improve Outcome: Progressing   Problem: Education: Goal: Knowledge of [place] [facility] Education information/materials will improve Outcome: Not Progressing Goal: Mental status will improve Outcome: Not Progressing Goal: Verbalization of understanding the information provided will improve Outcome: Not Progressing

## 2018-04-05 NOTE — Plan of Care (Signed)
  Problem: Activity: Goal: Will verbalize the importance of balancing activity with adequate rest periods Outcome: Progressing  Patient verbalize importance of rest .

## 2018-04-05 NOTE — Plan of Care (Signed)
Patient did not complete a self inventory sheet today. Interaction with patient is minimal. Patient denies SI/HI/AVH. Patient is seen this morning in the milieu eating breakfasts but isolates to self. Patient is seen in hallway interacting with internal stimuli. Report from nurse of the previous shift indicates that patient is still racially focused and demands care from an african [place] doctor. Patient is also seen "shadow boxing" in hallway. Patient is without complaint. Patient's safety is maintained on the unit.    Problem: Education: Goal: Knowledge of  General Education information/materials will improve Outcome: Not Progressing Goal: Emotional status will improve Outcome: Not Progressing Goal: Mental status will improve Outcome: Not Progressing Goal: Verbalization of understanding the information provided will improve Outcome: Not Progressing   Problem: Coping: Goal: Ability to demonstrate self-control will improve Outcome: Not Progressing

## 2018-04-05 NOTE — Progress Notes (Signed)
[facility] LCSW Group Therapy  8/252019 1 pm  Type of Therapy:  Group Therapy:Open discussion and Promoting self care  Participation Level:  Did not attend  Jaquavious Mercer LCSW [phone] 

## 2018-04-05 NOTE — Progress Notes (Signed)
Patient alert and oriented x 4, affect is flat but he bighted, upon approach. He denies SI/HI/AVH, his affect is labile appears less anxious, very pleasant and happy with staff.  Patient is interacting appropriately with peers and staff and complaint with medication Patient was offered support and encouraged to attend evening wrap up group. Patient didn't attend evening wrap up group. 15 minutes safety checks maintained will continue to closely monitor.

## 2018-04-05 NOTE — Progress Notes (Signed)
Patient quiet and isolative to self. First interaction with Clinical research associatewriter, patient would not acknowledge or answer any questions. Patient later talked with writer and was smiling and laughing. Patient forwards little. Minimal interaction with staff and peers. Encouragement and support offered. Safety checks maintained. Pt remains safe on unit with q 15 min checks

## 2018-04-06 NOTE — Tx Team (Addendum)
Interdisciplinary Treatment and Diagnostic Plan Update  04/06/2018 Time of Session: 11am Pilot Prindle. MRN: [id number]  Principal Diagnosis: Undifferentiated schizophrenia (HCC)  Secondary Diagnoses: Principal Problem:   Undifferentiated schizophrenia (HCC)   Current Medications:  Current Facility-Administered Medications  Medication Dose Route Frequency Provider Last Rate Last Dose  . acetaminophen (TYLENOL) tablet 650 mg  650 mg Oral Q6H PRN Clapacs, John T, MD      . alum & mag hydroxide-simeth (MAALOX/MYLANTA) 200-200-20 MG/5ML suspension 30 mL  30 mL Oral Q4H PRN Clapacs, John T, MD      . benztropine (COGENTIN) tablet 1 mg  1 mg Oral BID PRN McNew, Ileene Hutchinson, MD       Or  . benztropine mesylate (COGENTIN) injection 1 mg  1 mg Intramuscular BID PRN McNew, Holly R, MD      . divalproex (DEPAKOTE) DR tablet 1,000 mg  1,000 mg Oral BH-q7a McNew, Holly R, MD   1,000 mg at 04/06/18 4098   And  . divalproex (DEPAKOTE) DR tablet 750 mg  750 mg Oral QHS McNew, Holly R, MD   750 mg at 04/05/18 2105  . haloperidol (HALDOL) tablet 5 mg  5 mg Oral Q6H PRN McNew, Ileene Hutchinson, MD       Or  . haloperidol lactate (HALDOL) injection 5 mg  5 mg Intramuscular Q6H PRN McNew, Ileene Hutchinson, MD   5 mg at 04/01/18 0819  . hydrOXYzine (ATARAX/VISTARIL) tablet 50 mg  50 mg Oral TID PRN Clapacs, Jackquline Denmark, MD   50 mg at 04/04/18 2216  . LORazepam (ATIVAN) tablet 1 mg  1 mg Oral Q6H PRN Haskell Riling, MD   1 mg at 04/03/18 2214   Or  . LORazepam (ATIVAN) injection 1 mg  1 mg Intramuscular Q6H PRN Haskell Riling, MD   1 mg at 04/01/18 0820  . magnesium hydroxide (MILK OF MAGNESIA) suspension 30 mL  30 mL Oral Daily PRN Clapacs, John T, MD      . traZODone (DESYREL) tablet 100 mg  100 mg Oral QHS PRN Clapacs, Jackquline Denmark, MD   100 mg at 04/05/18 2107   PTA Medications: Medications Prior to Admission  Medication Sig Dispense Refill Last Dose  . atorvastatin (LIPITOR) 10 MG tablet Take 1 tablet (10 mg total) by  mouth daily at 6 PM. (Patient not taking: Reported on 11/16/2017) 30 tablet 0 Not Taking at Unknown time  . divalproex (DEPAKOTE ER) 500 MG 24 hr tablet Take 1,000 mg by mouth daily.   unknown at unknown  . hydrOXYzine (ATARAX/VISTARIL) 25 MG tablet Take 1 tablet (25 mg total) by mouth 3 (three) times daily. (Patient not taking: Reported on 11/16/2017) 30 tablet 0 Not Taking at Unknown time  . lamoTRIgine (LAMICTAL) 100 MG tablet Take 1 tablet (100 mg total) by mouth every evening. (Patient not taking: Reported on 11/16/2017) 30 tablet 0 Not Taking at Unknown time  . lamoTRIgine (LAMICTAL) 25 MG tablet Take 2 tablets (50 mg total) by mouth daily. (Patient not taking: Reported on 11/16/2017) 60 tablet 0 Not Taking at Unknown time  . loratadine (CLARITIN) 10 MG tablet Take 10 mg by mouth daily.   unknown at unknown  . LORazepam (ATIVAN) 0.5 MG tablet Take 1 tablet (0.5 mg total) by mouth 2 (two) times daily. (Patient not taking: Reported on 11/16/2017) 60 tablet 0 Not Taking at Unknown time  . OLANZapine zydis (ZYPREXA) 5 MG disintegrating tablet Take 1 tablet (5 mg total) by mouth  at bedtime. (Patient not taking: Reported on 11/16/2017) 30 tablet 0 Not Taking at Unknown time  . paliperidone (INVEGA SUSTENNA) 156 MG/ML SUSP injection Inject 1 mL (156 mg total) into the muscle every 28 (twenty-eight) days. NEXT DOSE DUE August 13, 2016 (Patient taking differently: Inject 156 mg into the muscle every 28 (twenty-eight) days. ) 0.9 mL 0   . traZODone (DESYREL) 100 MG tablet Take 1 tablet (100 mg total) by mouth at bedtime. (Patient taking differently: Take 50 mg by mouth at bedtime. ) 30 tablet 0 Not Taking at Unknown time    Patient Stressors: Medication change or noncompliance Substance abuse  Patient Strengths: General fund of knowledge  Treatment Modalities: Medication Management, Group therapy, Case management,  1 to 1 session with clinician, Psychoeducation, Recreational therapy.   Physician Treatment Plan  for Primary Diagnosis: Undifferentiated schizophrenia (HCC) Long Term Goal(s): Improvement in symptoms so as ready for discharge   Short Term Goals: Ability to identify changes in lifestyle to reduce recurrence of condition will improve  Medication Management: Evaluate patient's response, side effects, and tolerance of medication regimen.  Therapeutic Interventions: 1 to 1 sessions, Unit Group sessions and Medication administration.  Evaluation of Outcomes: Progressing  Physician Treatment Plan for Secondary Diagnosis: Principal Problem:   Undifferentiated schizophrenia (HCC)  Long Term Goal(s): Improvement in symptoms so as ready for discharge   Short Term Goals: Ability to identify changes in lifestyle to reduce recurrence of condition will improve     Medication Management: Evaluate patient's response, side effects, and tolerance of medication regimen.  Therapeutic Interventions: 1 to 1 sessions, Unit Group sessions and Medication administration.  Evaluation of Outcomes: Progressing   RN Treatment Plan for Primary Diagnosis: Undifferentiated schizophrenia (HCC) Long Term Goal(s): Knowledge of disease and therapeutic regimen to maintain health will improve  Short Term Goals: Ability to verbalize feelings will improve, Ability to identify and develop effective coping behaviors will improve and Compliance with prescribed medications will improve  Medication Management: RN will administer medications as ordered by provider, will assess and evaluate patient's response and provide education to patient for prescribed medication. RN will report any adverse and/or side effects to prescribing provider.  Therapeutic Interventions: 1 on 1 counseling sessions, Psychoeducation, Medication administration, Evaluate responses to treatment, Monitor vital signs and CBGs as ordered, Perform/monitor CIWA, COWS, AIMS and Fall Risk screenings as ordered, Perform wound care treatments as  ordered.  Evaluation of Outcomes: Progressing   LCSW Treatment Plan for Primary Diagnosis: Undifferentiated schizophrenia (HCC) Long Term Goal(s): Safe transition to appropriate next level of care at discharge, Engage patient in therapeutic group addressing interpersonal concerns.  Short Term Goals: Engage patient in aftercare planning with referrals and resources, Increase social support, Increase emotional regulation, Identify triggers associated with mental health/substance abuse issues and Increase skills for wellness and recovery  Therapeutic Interventions: Assess for all discharge needs, 1 to 1 time with Social worker, Explore available resources and support systems, Assess for adequacy in community support network, Educate family and significant other(s) on suicide prevention, Complete Psychosocial Assessment, Interpersonal group therapy.  Evaluation of Outcomes: Progressing   Progress in Treatment: Attending groups: No. Participating in groups: No. Taking medication as prescribed: Yes. Toleration medication: Yes. Family/Significant other contact made: Yes, individual(s) contacted:  Patients Guardian Patient understands diagnosis: Yes. Discussing patient identified problems/goals with staff: Yes. Medical problems stabilized or resolved: Yes. Denies suicidal/homicidal ideation: Yes. Issues/concerns per patient self-inventory: No. Other:   New problem(s) identified: No, Describe:  None  New Short Term/Long  Term Goal(s): "Take care of my family."  Patient Goals:   "Take care of my family."  Discharge Plan or Barriers: To discharge back to his group home and engage with outpatient services.  Reason for Continuation of Hospitalization: Medication stabilization  Estimated Length of Stay: 5-7 days Recreational Therapy: Patient Stressors: N/A Patient Goal: Patient will engage in interactions with peers and staff in pro-social manner at least 2x within 5 recreation therapy  group sessions   Attendees: Patient:  04/06/2018 11:54 AM  Physician: Corinna Gab, MD 04/06/2018 11:54 AM  Nursing: 04/06/2018 11:54 AM  RN Care Manager: 04/06/2018 11:54 AM  Social Worker: Johny Shears, LCSWA 04/06/2018 11:54 AM  Recreational Therapist: Danella Deis. Dreama Saa, LRT 04/06/2018 11:54 AM  Other:  04/06/2018 11:54 AM  Other: Jake Shark, LCSW 04/06/2018 11:54 AM  Other:John Joaquin Music 04/06/2018 11:54 AM    Scribe for Treatment Team: Johny Shears, LCSW 04/06/2018 11:54 AM

## 2018-04-06 NOTE — Progress Notes (Signed)
[facility] MD Progress Note  04/06/2018 3:27 PM David Davila.  MRN:  [id number] Subjective: Pt continues be calm on the unit. He has not had any further episodes of agitation or aggression since initial day of admission. He has been isolative to his room but this afternoon he spent several hours outside. He was interacting well with some peers. He is still talking to himself at times but this is also improved. He states taht he is feeling better. He states that the voices are better but "I keep talking to myself and I don't know why." He denies hearing voices. He states that the medication "are doing alright." He is fully oriented. He is much less bizzare in interacting. He does not elaborate much on questions but much less irritable today. He is sleeping well. He states that he would like to go back to group home but states, "I hope they will take me back." I spoke with his guardian today to provide update. I discussed possibility of adding Haldol to his medications to help with him responding to internal stimuli. She consents to this. She would like the group home to come visit him before discharge because they know his baseline. She also would like to try to come on Thursday to see him.   Principal Problem: Undifferentiated schizophrenia (HCC) Diagnosis:   Patient Active Problem List   Diagnosis Date Noted  . Undifferentiated schizophrenia (HCC) [F20.3] 03/26/2018    Priority: High  . TBI (traumatic brain injury) (HCC) [S06.9X9A] 07/10/2016  . Vitamin B12 deficiency [E53.8] 07/10/2016  . Noncompliance with medication regimen [Z91.14] 06/25/2016  . Cocaine use disorder, mild, abuse (HCC) [F14.10] 06/25/2016  . Cannabis use disorder, mild, abuse [F12.10] 06/25/2016  . Paranoid schizophrenia (HCC) [F20.0] 06/24/2016   Total Time spent with patient: 20 minutes  Past Psychiatric History: See h&P  Past Medical History:  Past Medical History:  Diagnosis Date  . Acute psychosis (HCC)   .  Antisocial personality disorder (HCC)   . Cannabis abuse    Past  . Paranoid schizophrenia (HCC)   . Schizoaffective disorder, bipolar type (HCC)    History reviewed. No pertinent surgical history. Family History: History reviewed. No pertinent family history. Family Psychiatric  History: See H&P Social History:  Social History   Substance and Sexual Activity  Alcohol Use No     Social History   Substance and Sexual Activity  Drug Use Not Currently    Social History   Socioeconomic History  . Marital status: Single    Spouse name: Not on file  . Number of children: Not on file  . Years of education: Not on file  . Highest education level: Not on file  Occupational History  . Not on file  Social Needs  . Financial resource strain: Not on file  . Food insecurity:    Worry: Not on file    Inability: Not on file  . Transportation needs:    Medical: Not on file    Non-medical: Not on file  Tobacco Use  . Smoking status: Former Smoker    Packs/day: 1.00    Types: Cigarettes  . Smokeless tobacco: Never Used  Substance and Sexual Activity  . Alcohol use: No  . Drug use: Not Currently  . Sexual activity: Not Currently  Lifestyle  . Physical activity:    Days per week: Not on file    Minutes per session: Not on file  . Stress: Not on file  Relationships  . Social  connections:    Talks on phone: Not on file    Gets together: Not on file    Attends religious service: Not on file    Active member of club or organization: Not on file    Attends meetings of clubs or organizations: Not on file    Relationship status: Not on file  Other Topics Concern  . Not on file  Social History Narrative  . Not on file   Additional Social History:                         Sleep: Good  Appetite:  Good  Current Medications: Current Facility-Administered Medications  Medication Dose Route Frequency Provider Last Rate Last Dose  . acetaminophen (TYLENOL) tablet 650 mg   650 mg Oral Q6H PRN Clapacs, John T, MD      . alum & mag hydroxide-simeth (MAALOX/MYLANTA) 200-200-20 MG/5ML suspension 30 mL  30 mL Oral Q4H PRN Clapacs, John T, MD      . benztropine (COGENTIN) tablet 1 mg  1 mg Oral BID PRN Kassius Battiste, Ileene Hutchinson, MD       Or  . benztropine mesylate (COGENTIN) injection 1 mg  1 mg Intramuscular BID PRN Adriene Padula R, MD      . divalproex (DEPAKOTE) DR tablet 1,000 mg  1,000 mg Oral BH-q7a Karin Griffith R, MD   1,000 mg at 04/06/18 1610   And  . divalproex (DEPAKOTE) DR tablet 750 mg  750 mg Oral QHS Gavriel Holzhauer R, MD   750 mg at 04/05/18 2105  . haloperidol (HALDOL) tablet 5 mg  5 mg Oral Q6H PRN Avante Carneiro, Ileene Hutchinson, MD       Or  . haloperidol lactate (HALDOL) injection 5 mg  5 mg Intramuscular Q6H PRN Ismeal Heider, Ileene Hutchinson, MD   5 mg at 04/01/18 0819  . hydrOXYzine (ATARAX/VISTARIL) tablet 50 mg  50 mg Oral TID PRN Clapacs, Jackquline Denmark, MD   50 mg at 04/04/18 2216  . LORazepam (ATIVAN) tablet 1 mg  1 mg Oral Q6H PRN Haskell Riling, MD   1 mg at 04/03/18 2214   Or  . LORazepam (ATIVAN) injection 1 mg  1 mg Intramuscular Q6H PRN Haskell Riling, MD   1 mg at 04/01/18 0820  . magnesium hydroxide (MILK OF MAGNESIA) suspension 30 mL  30 mL Oral Daily PRN Clapacs, John T, MD      . traZODone (DESYREL) tablet 100 mg  100 mg Oral QHS PRN Clapacs, Jackquline Denmark, MD   100 mg at 04/05/18 2107    Lab Results: No results found for this or any previous visit (from the past 48 hour(s)).  Blood Alcohol level:  Lab Results  Component Value Date   ETH <10 03/25/2018   ETH <10 12/06/2017    Metabolic Disorder Labs: Lab Results  Component Value Date   HGBA1C 5.4 03/25/2018   MPG 108.28 03/25/2018   No results found for: PROLACTIN Lab Results  Component Value Date   CHOL 184 03/25/2018   TRIG 43 03/25/2018   HDL 50 03/25/2018   CHOLHDL 3.7 03/25/2018   VLDL 9 03/25/2018   LDLCALC 125 (H) 03/25/2018    Physical Findings: AIMS: Facial and Oral Movements Muscles of Facial Expression:  None, normal Lips and Perioral Area: None, normal Jaw: None, normal Tongue: None, normal,Extremity Movements Upper (arms, wrists, hands, fingers): None, normal Lower (legs, knees, ankles, toes): None, normal, Trunk Movements Neck, shoulders, hips:  None, normal, Overall Severity Severity of abnormal movements (highest score from questions above): None, normal Incapacitation due to abnormal movements: None, normal Patient's awareness of abnormal movements (rate only patient's report): No Awareness, Dental Status Current problems with teeth and/or dentures?: No Does patient usually wear dentures?: No  CIWA:  CIWA-Ar Total: 0 COWS:  COWS Total Score: 0  Musculoskeletal: Strength & Muscle Tone: within normal limits Gait & Station: normal Patient leans: N/A  Psychiatric Specialty Exam: Physical Exam  Nursing note and vitals reviewed.   Review of Systems  All other systems reviewed and are negative.   Blood pressure (!) 86/52, pulse (!) 56, temperature 97.8 F (36.6 C), temperature source Oral, resp. rate 18, height 5\' 8"  (1.727 m), weight 58.1 kg, SpO2 100 %.Body mass index is 19.46 kg/m.  General Appearance: casual  Eye Contact:  Fair  Speech:  Clear and Coherent  Volume:  Normal  Mood:  Euthymic  Affect:  Constricted  Thought Process:  Coherent  Orientation:  Full (Time, Place, and Person)  Thought Content:  Denies all symptoms but seen responding to internal stimuli  Suicidal Thoughts:  No  Homicidal Thoughts:  No  Memory:  Immediate;   Fair  Judgement:  Impaired  Insight:  Lacking  Psychomotor Activity:  Pacing at times  Concentration:  Concentration: Poor  Recall:  FiservFair  Fund of Knowledge:  Fair  Language:  Fair  Akathisia:  No      Assets:  Resilience  ADL's:  Intact  Cognition:  WNL  Sleep:  Number of Hours: 6.15     Treatment Plan Summary: 37 yo male admitted due to agitation and aggression at his group home. He is out of his room today and spending most  of the afternoon outside. He is calm and has not had any aggression. He is seen responding to internal stimuli at times, although he states that the voices are better.   Plan:  Schizoaffective disorder -He received both injections of [place] Sustenna -Consider starting Haldol if he continue to respond to internal stimuli. His guardian consents to this if needed.  -Continue Depakote 1000 mg qam and 750 mg qhs  Neutropenia -Will recheck CBC tomorrow -Possibly [facility] but he has tolerated Invega multiple times in the past with no drop in ANC. I have added it to allergy list just so providers are aware of this possibility and to monitor CBC. He does well on Invega and is much more stable psychiatrically so will need to weigh risks and benefits  Dispo -He will return to group home when stable. Will have group home come to see him to see if he is at baseline. They are very hard to get a hold of. I did speak with his guardian today and she may try to come see him on Thursday.   Haskell RilingHolly R Tiffini Blacksher, MD 04/06/2018, 3:27 PM

## 2018-04-06 NOTE — Progress Notes (Signed)
Patient remained calm and slept for 6.15 hrs without any issues, no distress and safety is maintained.

## 2018-04-06 NOTE — Progress Notes (Signed)
Recreation Therapy Notes  Date: 04/06/2018  Time: 9:30 am   Location: Craft Room   Behavioral response: N/A   Intervention Topic: Problem Solving  Discussion/Intervention: Patient did not attend group.   Clinical Observations/Feedback:  Patient did not attend group.   David Davila LRT/CTRS        David Davila 04/06/2018 10:23 AM 
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
## 2018-04-06 NOTE — [facility] Counselor (Signed)
CSW attempted to contact Ladona Ridgelaylor at the patients group home to follow up with them and coordinate discharge plans. CSW was unable to leave a voicemail and will continue to contact them.  CSW spoke with the patients care coordinator and she reports that [facility] will come tomorrow 04/07/2018 between 12pm-1pm to assess the patient and put him on a wait list for their ACTT team services.   Johny Shearsassandra Daisee Centner, MSW, Theresia MajorsLCSWA, Bridget HartshornLCASA Clinical Social Worker 04/06/2018 3:09 PM

## 2018-04-07 MED ORDER — HALOPERIDOL 5 MG PO TABS
5.0000 mg | ORAL_TABLET | Freq: Every day | ORAL | Status: DC
  Administered 2018-04-07 – 2018-05-17 (×41): 5 mg via ORAL
  Filled 2018-04-07 (×42): qty 1

## 2018-04-07 NOTE — Plan of Care (Signed)
Patient was little irritable and refusing labs this morning.After he met with a peer who came to the unit is a friend of patient,patient interacting with the peer.Minimal interactions with others.Denies SI,HI and AVH.Appetite and energy level good.Support and encouragement given.

## 2018-04-07 NOTE — Progress Notes (Signed)
Recreation Therapy Notes  Date: 04/07/2018  Time: 9:30 am   Location: Craft Room   Behavioral response: N/A   Intervention Topic: Communication  Discussion/Intervention: Patient did not attend group.   Clinical Observations/Feedback:  Patient did not attend group.   Dyann Goodspeed LRT/CTRS        Lashaye Fisk 04/07/2018 11:30 AM

## 2018-04-07 NOTE — Plan of Care (Signed)
Patient is coping minimally and minimal socialization with peers, isolates self in his room most of the time , support and encourage provided to patient, patient is not identifying any positive attributes of self , mood and affect incongruent, encourage interactions with peers and others.with therapeutic reinforcement , patient sleep long hours with out interaction, no distress denies SI/HI and no AVH, 15 minute safety rounding is maintained ,.   Problem: Education: Goal: Knowledge of [facility] Education information/materials will improve Outcome: Progressing Goal: Emotional status will improve Outcome: Progressing Goal: Mental status will improve Outcome: Progressing Goal: Verbalization of understanding the information provided will improve Outcome: Progressing   Problem: Activity: Goal: Interest or engagement in activities will improve Outcome: Progressing Goal: Sleeping patterns will improve Outcome: Progressing   Problem: Coping: Goal: Ability to verbalize frustrations and anger appropriately will improve Outcome: Progressing Goal: Ability to demonstrate self-control will improve Outcome: Progressing   Problem: Health Behavior/Discharge Planning: Goal: Identification of resources available to assist in meeting health care needs will improve Outcome: Progressing Goal: Compliance with treatment plan for underlying cause of condition will improve Outcome: Progressing   Problem: Physical Regulation: Goal: Ability to maintain clinical measurements within normal limits will improve Outcome: Progressing   Problem: Safety: Goal: Periods of time without injury will increase Outcome: Progressing   Problem: Activity: Goal: Will verbalize the importance of balancing activity with adequate rest periods Outcome: Progressing   Problem: Education: Goal: Will be free of psychotic symptoms Outcome: Progressing Goal: Knowledge of the prescribed therapeutic regimen will  improve Outcome: Progressing   Problem: Coping: Goal: Coping ability will improve Outcome: Progressing Goal: Will verbalize feelings Outcome: Progressing   Problem: Health Behavior/Discharge Planning: Goal: Compliance with prescribed medication regimen will improve Outcome: Progressing   Problem: Nutritional: Goal: Ability to achieve adequate nutritional intake will improve Outcome: Progressing   Problem: Role Relationship: Goal: Ability to communicate needs accurately will improve Outcome: Progressing Goal: Ability to interact with others will improve Outcome: Progressing   Problem: Safety: Goal: Ability to redirect hostility and anger into socially appropriate behaviors will improve Outcome: Progressing Goal: Ability to remain free from injury will improve Outcome: Progressing   Problem: Self-Care: Goal: Ability to participate in self-care as condition permits will improve Outcome: Progressing   Problem: Self-Concept: Goal: Will verbalize positive feelings about self Outcome: Progressing   Problem: Safety: Goal: Ability to remain free from injury will improve Outcome: Progressing

## 2018-04-07 NOTE — [facility] Group Notes (Signed)
[facility] Group Notes:  (Nursing/MHT/Case Management/Adjunct)  Date:  04/07/2018  Time:  9:16 PM  Type of Therapy:  Group Therapy  Participation Level:  Active  Participation Quality:  Appropriate  Affect:  Appropriate  Cognitive:  Alert  Insight:  Good  Engagement in Group:  Engaged  Modes of Intervention:  Support  Summary of Progress/Problems:  Mayra NeerJackie L Jaquavian Firkus 04/07/2018, 9:16 PM

## 2018-04-07 NOTE — Progress Notes (Signed)
Patient ID: David Mulleraul Stephenson Brault Jr., male   DOB: 09/30/1980, 37 y.o.   MRN: [id number] PER STATE REGULATIONS 482.30  THIS CHART WAS REVIEWED FOR MEDICAL NECESSITY WITH RESPECT TO THE PATIENT'S ADMISSION/ DURATION OF STAY.  NEXT REVIEW DATE: 04/11/2018  Willa RoughJENNIFER JONES Brigette Hopfer, RN, BSN CASE MANAGER

## 2018-04-07 NOTE — Progress Notes (Signed)
[facility] MD Progress Note  04/07/2018 2:31 PM [name]  MRN:  [id number] Subjective:  Pt continues to be calm on the unit. He is out of his room most of the afternoon. HE is observed talking and socializing very well with another peer. He is even playing basketball this afternoon. He seems to be in good spirits. He has not had any further episodes of aggression since admission.  Upon evaluation today, he is calm. Still slightly disorganized. He states, "they keep giving me the wrong medications. They are giving me a lethal injection. All injections are lethal." When discussed that this is not the case, he states, "that's like saying marijuana helps with glaucoma." He then walked out of my office.   Principal Problem: Undifferentiated schizophrenia (HCC) Diagnosis:   Patient Active Problem List   Diagnosis Date Noted  . Undifferentiated schizophrenia (HCC) [F20.3] 03/26/2018    Priority: High  . TBI (traumatic brain injury) (HCC) [S06.9X9A] 07/10/2016  . Vitamin B12 deficiency [E53.8] 07/10/2016  . Noncompliance with medication regimen [Z91.14] 06/25/2016  . Cocaine use disorder, mild, abuse (HCC) [F14.10] 06/25/2016  . Cannabis use disorder, mild, abuse [F12.10] 06/25/2016  . Paranoid schizophrenia (HCC) [F20.0] 06/24/2016   Total Time spent with patient: 20 minutes  Past Psychiatric History: See h&P  Past Medical History:  Past Medical History:  Diagnosis Date  . Acute psychosis (HCC)   . Antisocial personality disorder (HCC)   . Cannabis abuse    Past  . Paranoid schizophrenia (HCC)   . Schizoaffective disorder, bipolar type (HCC)    History reviewed. No pertinent surgical history. Family History: History reviewed. No pertinent family history. Family Psychiatric  History: See H&P Social History:  Social History   Substance and Sexual Activity  Alcohol Use No     Social History   Substance and Sexual Activity  Drug Use Not Currently    Social History    Socioeconomic History  . Marital status: Single    Spouse name: Not on file  . Number of children: Not on file  . Years of education: Not on file  . Highest education level: Not on file  Occupational History  . Not on file  Social Needs  . Financial resource strain: Not on file  . Food insecurity:    Worry: Not on file    Inability: Not on file  . Transportation needs:    Medical: Not on file    Non-medical: Not on file  Tobacco Use  . Smoking status: Former Smoker    Packs/day: 1.00    Types: Cigarettes  . Smokeless tobacco: Never Used  Substance and Sexual Activity  . Alcohol use: No  . Drug use: Not Currently  . Sexual activity: Not Currently  Lifestyle  . Physical activity:    Days per week: Not on file    Minutes per session: Not on file  . Stress: Not on file  Relationships  . Social connections:    Talks on phone: Not on file    Gets together: Not on file    Attends religious service: Not on file    Active member of club or organization: Not on file    Attends meetings of clubs or organizations: Not on file    Relationship status: Not on file  Other Topics Concern  . Not on file  Social History Narrative  . Not on file   Additional Social History:  Sleep: Good  Appetite:  Good  Current Medications: Current Facility-Administered Medications  Medication Dose Route Frequency Provider Last Rate Last Dose  . acetaminophen (TYLENOL) tablet 650 mg  650 mg Oral Q6H PRN Clapacs, John T, MD      . alum & mag hydroxide-simeth (MAALOX/MYLANTA) 200-200-20 MG/5ML suspension 30 mL  30 mL Oral Q4H PRN Clapacs, John T, MD      . benztropine (COGENTIN) tablet 1 mg  1 mg Oral BID PRN Ellicia Alix, Ileene HutchinsonHolly R, MD       Or  . benztropine mesylate (COGENTIN) injection 1 mg  1 mg Intramuscular BID PRN Aquarius Tremper R, MD      . divalproex (DEPAKOTE) DR tablet 1,000 mg  1,000 mg Oral BH-q7a Lillieann Pavlich R, MD   1,000 mg at 04/07/18 [id number]   And  .  divalproex (DEPAKOTE) DR tablet 750 mg  750 mg Oral QHS Jereld Presti R, MD   750 mg at 04/06/18 2109  . haloperidol (HALDOL) tablet 5 mg  5 mg Oral Q6H PRN Tavaras Goody, Ileene HutchinsonHolly R, MD       Or  . haloperidol lactate (HALDOL) injection 5 mg  5 mg Intramuscular Q6H PRN Ozell Juhasz, Ileene HutchinsonHolly R, MD   5 mg at 04/01/18 0819  . hydrOXYzine (ATARAX/VISTARIL) tablet 50 mg  50 mg Oral TID PRN Clapacs, Jackquline DenmarkJohn T, MD   50 mg at 04/04/18 2216  . LORazepam (ATIVAN) tablet 1 mg  1 mg Oral Q6H PRN Haskell RilingMcNew, Milli Woolridge R, MD   1 mg at 04/03/18 2214   Or  . LORazepam (ATIVAN) injection 1 mg  1 mg Intramuscular Q6H PRN Haskell RilingMcNew, Sonny Poth R, MD   1 mg at 04/01/18 0820  . magnesium hydroxide (MILK OF MAGNESIA) suspension 30 mL  30 mL Oral Daily PRN Clapacs, John T, MD      . traZODone (DESYREL) tablet 100 mg  100 mg Oral QHS PRN Clapacs, Jackquline DenmarkJohn T, MD   100 mg at 04/05/18 2107    Lab Results: No results found for this or any previous visit (from the past 48 hour(s)).  Blood Alcohol level:  Lab Results  Component Value Date   ETH <10 03/25/2018   ETH <10 12/06/2017    Metabolic Disorder Labs: Lab Results  Component Value Date   HGBA1C 5.4 03/25/2018   MPG 108.28 03/25/2018   No results found for: PROLACTIN Lab Results  Component Value Date   CHOL 184 03/25/2018   TRIG 43 03/25/2018   HDL 50 03/25/2018   CHOLHDL 3.7 03/25/2018   VLDL 9 03/25/2018   LDLCALC 125 (H) 03/25/2018    Physical Findings: AIMS: Facial and Oral Movements Muscles of Facial Expression: None, normal Lips and Perioral Area: None, normal Jaw: None, normal Tongue: None, normal,Extremity Movements Upper (arms, wrists, hands, fingers): None, normal Lower (legs, knees, ankles, toes): None, normal, Trunk Movements Neck, shoulders, hips: None, normal, Overall Severity Severity of abnormal movements (highest score from questions above): None, normal Incapacitation due to abnormal movements: None, normal Patient's awareness of abnormal movements (rate only patient's  report): No Awareness, Dental Status Current problems with teeth and/or dentures?: No Does patient usually wear dentures?: No  CIWA:  CIWA-Ar Total: 0 COWS:  COWS Total Score: 0  Musculoskeletal: Strength & Muscle Tone: within normal limits Gait & Station: normal Patient leans: N/A  Psychiatric Specialty Exam: Physical Exam  Nursing note and vitals reviewed.   Review of Systems  All other systems reviewed and are negative.   Blood pressure 108/72,  pulse 68, temperature 98.6 F (37 C), temperature source Oral, resp. rate 18, height 5\' 8"  (1.727 m), weight 58.1 kg, SpO2 100 %.Body mass index is 19.46 kg/m.  General Appearance: Casual  Eye Contact:  Fair  Speech:  Clear and Coherent  Volume:  Normal  Mood:  Euthymic  Affect:  Appropriate  Thought Process:  Disorganized at times but slightly improved  Orientation:  Full (Time, Place, and Person)  Thought Content:  Illogical  Suicidal Thoughts:  No  Homicidal Thoughts:  No  Memory:  Immediate;   Fair  Judgement:  Impaired  Insight:  Lacking  Psychomotor Activity:  Normal  Concentration:  Concentration: Poor  Recall:  [employer] of Knowledge:  Fair  Language:  Fair  Akathisia:  No      Assets:  Resilience  ADL's:  Intact  Cognition:  WNL  Sleep:  Number of Hours: 7.45     Treatment Plan Summary: 37 yo male admitted due to paranoia and aggression with group home staff. He has been much calmer and no episodes of aggression since receiving Invega. He is still slightly disorganized and delusional but is out of his room more and socializing more with a peer today. He is also playing basketball.   Plan:  Schizoaffective disorder -he received both initial injections of [place] -will add Haldol 5 mg qhs for lingering psychosis. Also if he needs to switch LAI next month if he is still neutropenic this will help establish tolerability. I have discussed this with his guardian yesterday who consents to this.    Neutropenia -CBC not completed yet  Dispo -he will return to group home when stable. They are unable to come see him at the hospital but are willing to take him back.   Haskell Riling, MD 04/07/2018, 2:31 PM

## 2018-04-07 NOTE — [facility] Group Notes (Signed)
[facility] Group Notes:  (Nursing/MHT/Case Management/Adjunct)  Date:  04/07/2018  Time:  12:27 AM  Type of Therapy:  Group Therapy  Participation Level:  Did Not Attend   David NeighborsKeith D [name] 04/07/2018, 12:27 AM

## 2018-04-07 NOTE — [facility] Group Notes (Signed)
04/07/2018 1PM  Type of Therapy/Topic:  Group Therapy:  Feelings about Diagnosis  Participation Level:  Active   Description of Group:   This group will allow patients to explore their thoughts and feelings about diagnoses they have received. Patients will be guided to explore their level of understanding and acceptance of these diagnoses. Facilitator will encourage patients to process their thoughts and feelings about the reactions of others to their diagnosis and will guide patients in identifying ways to discuss their diagnosis with significant others in their lives. This group will be process-oriented, with patients participating in exploration of their own experiences, giving and receiving support, and processing challenge from other group members.   Therapeutic Goals: 1. Patient will demonstrate understanding of diagnosis as evidenced by identifying two or more symptoms of the disorder 2. Patient will be able to express two feelings regarding the diagnosis 3. Patient will demonstrate their ability to communicate their needs through discussion and/or role play  Summary of Patient Progress: Actively and appropriately engaged in the group. Patient was able to provide support and validation to other group members.Patient practiced active listening when interacting with the facilitator and other group members. [name] says that he was feeling "laughingly and scared" when he was admitted into the hospital. He reports "I was in denial, depression and bargaining." He says that he can better communicate with others by being "happy and loving". Patient is still in the process of obtaining treatment goals.        Therapeutic Modalities:   Cognitive Behavioral Therapy Brief Therapy Feelings Identification    David ShearsCassandra  Anthoney Sheppard, David MtLCSW 04/07/2018 2:31 PM

## 2018-04-08 LAB — CBC WITH DIFFERENTIAL/PLATELET
BASOS PCT: 1 %
Basophils Absolute: 0 10*3/uL (ref 0–0.1)
EOS ABS: 0.1 10*3/uL (ref 0–0.7)
Eosinophils Relative: 3 %
HEMATOCRIT: 38.5 % — AB (ref 40.0–52.0)
Hemoglobin: 13.3 g/dL (ref 13.0–18.0)
Lymphocytes Relative: 38 %
Lymphs Abs: 1.4 10*3/uL (ref 1.0–3.6)
MCH: 31.4 pg (ref 26.0–34.0)
MCHC: 34.5 g/dL (ref 32.0–36.0)
MCV: 91.1 fL (ref 80.0–100.0)
MONO ABS: 0.7 10*3/uL (ref 0.2–1.0)
MONOS PCT: 20 %
NEUTROS ABS: 1.4 10*3/uL (ref 1.4–6.5)
Neutrophils Relative %: 38 %
Platelets: 161 10*3/uL (ref 150–440)
RBC: 4.22 MIL/uL — ABNORMAL LOW (ref 4.40–5.90)
RDW: 13.9 % (ref 11.5–14.5)
WBC: 3.6 10*3/uL — ABNORMAL LOW (ref 3.8–10.6)

## 2018-04-08 MED ORDER — BENZTROPINE MESYLATE 1 MG PO TABS
1.0000 mg | ORAL_TABLET | Freq: Every day | ORAL | Status: DC
  Administered 2018-04-08 – 2018-05-17 (×40): 1 mg via ORAL
  Filled 2018-04-08 (×40): qty 1

## 2018-04-08 MED ORDER — BENZTROPINE MESYLATE 1 MG PO TABS: 1.0000 mg | ORAL_TABLET | Freq: Two times a day (BID) | ORAL | Status: DC

## 2018-04-08 NOTE — Progress Notes (Addendum)
[facility]  04/08/2018 12:38 PM David MullerPaul Stephenson Tift Jr.  MRN:  [id number]   Subjective:  Per RN staff, pt is much less anxious and bizzare. He has been very pleasant and appropriate with staff. No evidence of agitation or aggression at all. Upon evaluation, today he is smiling and in a much better mood. He states that the voices are better. He states that he is feeling well. He is happy to hear that he can return to the group home. Discussed that his guardian would try to come meet him tomorrow and he felt okay with this. He denies any muscle stiffness with Haldol. No evidence of muscle stiffness or cogwheeling on exam. He did not have any delusional thought content today.   Principal Problem: Undifferentiated schizophrenia (HCC) Diagnosis:   Patient Active Problem List   Diagnosis Date Noted  . Undifferentiated schizophrenia (HCC) [F20.3] 03/26/2018    Priority: High  . TBI (traumatic brain injury) (HCC) [S06.9X9A] 07/10/2016  . Vitamin B12 deficiency [E53.8] 07/10/2016  . Noncompliance with medication regimen [Z91.14] 06/25/2016  . Cocaine use disorder, mild, abuse (HCC) [F14.10] 06/25/2016  . Cannabis use disorder, mild, abuse [F12.10] 06/25/2016  . 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 Acute psychosis (HCC)   . Antisocial personality disorder (HCC)   . Cannabis abuse    Past  . Paranoid schizophrenia (HCC)   . Schizoaffective disorder, bipolar type (HCC)    History reviewed. No pertinent surgical history. Family History: History reviewed. No pertinent family history. Family Psychiatric  History: See H&P Social History:  Social History   Substance and Sexual Activity  Alcohol Use No     Social History   Substance and Sexual Activity  Drug Use Not Currently    Social History   Socioeconomic History  .  Marital status: Single    Spouse name: Not on file  . Number of children: Not on file  . Years of education: Not on file  . Highest education level: Not on file  Occupational History  . Not on file  Social Needs  . Financial resource strain: Not on file  . Food insecurity:    Worry: Not on file    Inability: Not on file  . Transportation needs:    Medical: Not on file    Non-medical: Not on file  Tobacco Use  . Smoking status: Former Smoker    Packs/day: 1.00    Types: Cigarettes  . Smokeless tobacco: Never Used  Substance and Sexual Activity  . Alcohol use: No  . Drug use: Not Currently  . Sexual activity: Not Currently  Lifestyle  . Physical activity:    Days per week: Not on file    Minutes per session: Not on file  . Stress: Not on file  Relationships  . Social connections:    Talks on phone: Not on file    Gets together: Not on file    Attends religious service: Not on file    Active member of club or organization: Not on file    Attends meetings of clubs or organizations: Not on file    Relationship status: Not on file  Other Topics Concern  . Not on file  Social History Narrative  . Not on file   Additional Social History:  Sleep: Good  Appetite:  Good  Current Medications: Current Facility-Administered Medications  Medication Dose Route Frequency Provider Last Rate Last Dose  . acetaminophen (TYLENOL) tablet 650 mg  650 mg Oral Q6H PRN Clapacs, John T, MD      . alum & mag hydroxide-simeth (MAALOX/MYLANTA) 200-200-20 MG/5ML suspension 30 mL  30 mL Oral Q4H PRN Clapacs, John T, MD      . benztropine (COGENTIN) tablet 1 mg  1 mg Oral BID PRN Riggs Dineen, Ileene Hutchinson, MD       Or  . benztropine mesylate (COGENTIN) injection 1 mg  1 mg Intramuscular BID PRN Alexsia Klindt, Ileene Hutchinson, MD      . benztropine (COGENTIN) tablet 1 mg  1 mg Oral BID Andron Marrazzo R, MD      . divalproex (DEPAKOTE) DR tablet 1,000 mg  1,000 mg Oral BH-q7a Loletha Bertini  R, MD   1,000 mg at 04/07/18 1610   And  . divalproex (DEPAKOTE) DR tablet 750 mg  750 mg Oral QHS Boden Stucky R, MD   750 mg at 04/07/18 2157  . haloperidol (HALDOL) tablet 5 mg  5 mg Oral Q6H PRN Uri Covey, Ileene Hutchinson, MD       Or  . haloperidol lactate (HALDOL) injection 5 mg  5 mg Intramuscular Q6H PRN Natasha Burda, Ileene Hutchinson, MD   5 mg at 04/01/18 0819  . haloperidol (HALDOL) tablet 5 mg  5 mg Oral QHS Darice Vicario, Ileene Hutchinson, MD   5 mg at 04/07/18 2158  . hydrOXYzine (ATARAX/VISTARIL) tablet 50 mg  50 mg Oral TID PRN Clapacs, Jackquline Denmark, MD   50 mg at 04/04/18 2216  . LORazepam (ATIVAN) tablet 1 mg  1 mg Oral Q6H PRN Haskell Riling, MD   1 mg at 04/07/18 2157   Or  . LORazepam (ATIVAN) injection 1 mg  1 mg Intramuscular Q6H PRN Haskell Riling, MD   1 mg at 04/01/18 0820  . magnesium hydroxide (MILK OF MAGNESIA) suspension 30 mL  30 mL Oral Daily PRN Clapacs, John T, MD      . traZODone (DESYREL) tablet 100 mg  100 mg Oral QHS PRN Clapacs, Jackquline Denmark, MD   100 mg at 04/07/18 2157    Lab Results: No results found for this or any previous visit (from the past 48 hour(s)).  Blood Alcohol level:  Lab Results  Component Value Date   ETH <10 03/25/2018   ETH <10 12/06/2017    Metabolic Disorder Labs: Lab Results  Component Value Date   HGBA1C 5.4 03/25/2018   MPG 108.28 03/25/2018   No results found for: PROLACTIN Lab Results  Component Value Date   CHOL 184 03/25/2018   TRIG 43 03/25/2018   HDL 50 03/25/2018   CHOLHDL 3.7 03/25/2018   VLDL 9 03/25/2018   LDLCALC 125 (H) 03/25/2018    Physical Findings: AIMS: Facial and Oral Movements Muscles of Facial Expression: None, normal Lips and Perioral Area: None, normal Jaw: None, normal Tongue: None, normal,Extremity Movements Upper (arms, wrists, hands, fingers): None, normal Lower (legs, knees, ankles, toes): None, normal, Trunk Movements Neck, shoulders, hips: None, normal, Overall Severity Severity of abnormal movements (highest score from questions  above): None, normal Incapacitation due to abnormal movements: None, normal Patient's awareness of abnormal movements (rate only patient's report): No Awareness, Dental Status Current problems with teeth and/or dentures?: No Does patient usually wear dentures?: No  CIWA:  CIWA-Ar Total: 0 COWS:  COWS Total Score: 0  Musculoskeletal: Strength &  Muscle Tone: within normal limits Gait & Station: normal Patient leans: N/A  Psychiatric Specialty Exam: Physical Exam  Nursing note and vitals reviewed. -no stiffness or rigidity or cogwheeling on exam  Review of Systems  All other systems reviewed and are negative.   Blood pressure 108/72, pulse 68, temperature 98.6 F (37 C), temperature source Oral, resp. rate 18, height 5\' 8"  (1.727 m), weight 58.1 kg, SpO2 100 %.Body mass index is 19.46 kg/m.  General Appearance: Casual  Eye Contact:  Good  Speech:  Clear and Coherent  Volume:  Normal  Mood:  Euthymic  Affect:  Appropriate  Thought Process:  Coherent  Orientation:  Full (Time, Place, and Person)  Thought Content:  Logical  Suicidal Thoughts:  No  Homicidal Thoughts:  No  Memory:  Immediate;   Fair  Judgement:  Impaired  Insight:  Lacking  Psychomotor Activity:  Normal  Concentration:  Concentration: Fair  Recall:  [employer] of Knowledge:  Fair  Language:  Fair  Akathisia:  No      Assets:  Resilience  ADL's:  Intact  Cognition:  WNL  Sleep:  Number of Hours: 6.5     Treatment Plan Summary: 37 yo male admitted due to agitation with group home staff. He received both initial injections of Invega on the unit. Psychosis has improved significantly. However, he was found to be neutropenic after he receive Invega injections. Discussed this with his guardian and my recommendation would be to transition him to Haldol Dec injections when his next injection is due next month. He is tolerating oral Haldol 5 mg currently. He will get 50 mg dose of Haldol Dec in September and then  increase oral dose to 5 mg BID. If he tolerates this, then he will be given 100 mg monthly starting in October. He will likely need this higher dose of Haldol Decanoate. He has done well on Invega Sustenna (but has had intermittent decrease in WBC and ANC while on INvega) so this could be retrialed if his ANC returns to normal.   Plan:  Schizoaffective disorder -He received both initial injections of [place] -He is on oral haldol 5 mg qhs currently. Next month he will be transitioned to Haldol decanoate 50 mg (due to neutropenia with Invega) with a simultaneous increase in oral Haldol of 5 mg BID. Starting in October, he will get 100 mg monthly. I discussed this with his guardian who consents to this transition.  -Start Cogentin 1 mg BID. Guardian consents to this  Neutropenia -Likely due to [facility] -He will return to group home when stable. They will possibly be coming to see him tomorrow. Possible discharge Friday  ADDENDUM: Repeat CBC showed improved WBC at 3.6 and normal ANC (1.4) so should be able to continue [place] which is recommended since he has responded well to it. Will update Guardian on this.   Haskell Riling, MD 04/08/2018, 12:38 PM

## 2018-04-08 NOTE — Plan of Care (Signed)
  Problem: Coping: Goal: Ability to verbalize frustrations and anger appropriately will improve Outcome: Progressing  Patient verbalized anger and frustration to staff.

## 2018-04-08 NOTE — Plan of Care (Signed)
Patient alert and oriented with noted irritability today. Refused to take his Depakote this morning, lies in bed majority if the day with covers thrown over his head, often refusing to speak or respond to staff when he is spoken to. Interacts with a certain peer, not much interaction with others. Up for meals only then will go back to his room to go back to sleep. Denies wanting to hurt himself or anyone else. Denies depression and anxiety. Milieu remains safe with Q 15 minute safety checks.

## 2018-04-08 NOTE — Progress Notes (Signed)
Patient alert and oriented x 4, affect is flat but he bighted, upon approach. He denies SI/HI/AVH, his affect is flat, but he brightens upon approach, he appears less anxious, very pleasant and happy with staff.  Patient is interacting appropriately with peers and staff and complaint with medication Patient was offered support and encouraged to attend evening wrap up group. Patient didn't attend evening wrap up group. 15 minutes safety checks maintained will continue to closely monitor.

## 2018-04-08 NOTE — Progress Notes (Signed)
Recreation Therapy Notes  Date: 04/09/2018  Time: 9:30 am   Location: Craft Room   Behavioral response: N/A   Intervention Topic: Emotions  Discussion/Intervention: Patient did not attend group.   Clinical Observations/Feedback:  Patient did not attend group.   Trisa Cranor LRT/CTRS        David Davila 04/08/2018 10:20 AM

## 2018-04-09 NOTE — Plan of Care (Signed)
Patient alert and oriented today with less irritability.  Refused to took his Depakote this morning and lies in bed majority of the day with covers thrown over his head, often refusing to speak or respond to staff when he is spoken to. Interacts with a certain peer, not much interaction with others. Up for meals only then will go back to his room to go back to sleep. Denies wanting to hurt himself or anyone else. Denies depression and anxiety. Milieu remains safe with Q 15 minute safety checks.

## 2018-04-09 NOTE — BHH Counselor (Signed)
CSW spoke with the patients guardian David Davila regarding the group home refusing to accept the patient back at discharge. CSW completed an FL2 and will send it to the guardian and assist in securing the patient a new group home placement. CSW will continue to maintain communication between treatment team members regarding the patients status and discharge plan.  [name], MSW, Theresia MajorsLCSWA, Bridget HartshornLCASA Clinical Social Worker 04/09/2018 3:58 PM

## 2018-04-09 NOTE — Progress Notes (Signed)
Recreation Therapy Notes  Date: 04/09/2018  Time: 9:30 am   Location: Craft Room   Behavioral response: N/A   Intervention Topic: Goals  Discussion/Intervention: Patient did not attend group.   Clinical Observations/Feedback:  Patient did not attend group.   Derrall Hicks LRT/CTRS        David Davila 04/09/2018 11:07 AM 

## 2018-04-09 NOTE — BHH Group Notes (Signed)
04/09/2018 1PM  Type of Therapy/Topic:  Group Therapy:  Balance in Life  Participation Level:  Active  Description of Group:   This group will address the concept of balance and how it feels and looks when one is unbalanced. Patients will be encouraged to process areas in their lives that are out of balance and identify reasons for remaining unbalanced. Facilitators will guide patients in utilizing problem-solving interventions to address and correct the stressor making their life unbalanced. Understanding and applying boundaries will be explored and addressed for obtaining and maintaining a balanced life. Patients will be encouraged to explore ways to assertively make their unbalanced needs known to significant others in their lives, using other group members and facilitator for support and feedback.  Therapeutic Goals: 1. Patient will identify two or more emotions or situations they have that consume much of in their lives. 2. Patient will identify signs/triggers that life has become out of balance:  3. Patient will identify two ways to set boundaries in order to achieve balance in their lives:  4. Patient will demonstrate ability to communicate their needs through discussion and/or role plays  Summary of Patient Progress: Patient attended free expressions group which included recreational play and music therapy. Patient interacted appropriately with other group members. Patient is still in the process of meeting their treatment goals.        Therapeutic Modalities:   Cognitive Behavioral Therapy Solution-Focused Therapy Assertiveness Training  Sheelah Ritacco, LCSW  

## 2018-04-09 NOTE — BHH Counselor (Signed)
CSW spoke with David Davila and he reports having 2 openings currently available for male patients. [facility]. David Davila reports that he will have someone come and meet with the patient tomorrow morning 04/10/2018 before picking up another patient for discharge.   Johny Shearsassandra Miliana Gangwer, MSW, Theresia MajorsLCSWA, Bridget HartshornLCASA Clinical Social Worker 04/09/2018 4:34 PM

## 2018-04-09 NOTE — Plan of Care (Signed)
Disheveled still, poor hygiene, needs ADL upkeep; mood and affect no as angry and hostile, thoughts getting better, speech becoming logical but cannot, as yet, engage in a long conversations, emotional status improving, medication compliant. Trazodone 100 mg PO PRN given for sleep.  Patient slept for Estimated Hours of 7.45; Precautionary checks every 15 minutes for safety maintained, room free of safety hazards, patient sustains no injury or falls during this shift.  Problem: Education: Goal: Emotional status will improve Outcome: Progressing Goal: Mental status will improve Outcome: Progressing   Problem: Activity: Goal: Sleeping patterns will improve Outcome: Progressing   Problem: Coping: Goal: Ability to verbalize frustrations and anger appropriately will improve Outcome: Progressing   Problem: Health Behavior/Discharge Planning: Goal: Compliance with treatment plan for underlying cause of condition will improve Outcome: Progressing   Problem: Safety: Goal: Periods of time without injury will increase Outcome: Progressing   Problem: Education: Goal: Verbalization of understanding the information provided will improve Outcome: Not Progressing   Problem: Activity: Goal: Interest or engagement in activities will improve Outcome: Not Progressing   Problem: Education: Goal: Will be free of psychotic symptoms Outcome: Not Progressing

## 2018-04-09 NOTE — [place] FL2 (Addendum)
[place] MEDICAID FL2 LEVEL OF CARE SCREENING TOOL     IDENTIFICATION  Patient Name: David Mulleraul Stephenson Cassady Jr. Birthdate: 01/14/1981 Sex: male Admission Date (Current Location): 03/26/2018  [place]  Teacher, [place] as a foreign language[place] [id number] Facility and Address:  [facility], [address], [place], [place] [address]      Provider Number: [id number]  Attending Physician Name and Address:  Haskell RilingMcNew, Holly R, MD  Relative Name and Phone Number:  Concepcion Elkatasha Scott is legal guardian [phone] [facility] [place]     Current Level of Care: Hospital Recommended Level of Care: Other (Comment), Family Care Home(Group Home) Prior Approval Number:    Date Approved/Denied:   PASRR Number:    Discharge Plan: Domiciliary (Rest home)    Current Diagnoses: Patient Active Problem List   Diagnosis Date Noted  . 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 TBI (traumatic brain injury) (HCC) 07/10/2016  . Vitamin B12 deficiency 07/10/2016  . Noncompliance with medication regimen 06/25/2016  . Cocaine use disorder, mild, abuse (HCC) 06/25/2016  . Cannabis use disorder, mild, abuse 06/25/2016  . Paranoid schizophrenia (HCC) 06/24/2016    Orientation RESPIRATION BLADDER Height & Weight     Self, Time, Situation, Place  Normal Continent Weight: 128 lb (58.1 kg) Height:  5\' 8"  (172.7 cm)  BEHAVIORAL SYMPTOMS/MOOD NEUROLOGICAL BOWEL NUTRITION STATUS  Other (Comment)(Severe agitation, aggression and disorganization when off of his medications) (None) Continent Diet  AMBULATORY STATUS COMMUNICATION OF NEEDS Skin   Independent Verbally Normal                       Personal Care Assistance Level of Assistance  Bathing, Feeding, Dressing, Total care Bathing Assistance: Independent Feeding assistance: Independent Dressing Assistance: Independent Total Care Assistance: Independent   Functional Limitations Info  Sight, Hearing, Speech Sight Info: Adequate Hearing  Info: Adequate Speech Info: Adequate    SPECIAL CARE FACTORS FREQUENCY  (N/A)                    Contractures Contractures Info: Not present    Additional Factors Info  Code Status, Psychotropic Code Status Info: Full Code   Psychotropic Info: Psycotropic Medications         Current Medications (04/09/2018):  This is the current hospital active medication list Current Facility-Administered Medications  Medication Dose Route Frequency Provider Last Rate Last Dose  . acetaminophen (TYLENOL) tablet 650 mg  650 mg Oral Q6H PRN Clapacs, John T, MD      . alum & mag hydroxide-simeth (MAALOX/MYLANTA) 200-200-20 MG/5ML suspension 30 mL  30 mL Oral Q4H PRN Clapacs, John T, MD      . benztropine (COGENTIN) tablet 1 mg  1 mg Oral BID PRN McNew, Ileene HutchinsonHolly R, MD   1 mg at 04/08/18 2048   Or  . benztropine mesylate (COGENTIN) injection 1 mg  1 mg Intramuscular BID PRN McNew, Ileene HutchinsonHolly R, MD      . benztropine (COGENTIN) tablet 1 mg  1 mg Oral QHS McNew, Ileene HutchinsonHolly R, MD   1 mg at 04/08/18 2050  . divalproex (DEPAKOTE) DR tablet 1,000 mg  1,000 mg Oral BH-q7a McNew, Ileene HutchinsonHolly R, MD   1,000 mg at 04/09/18 0602   And  . divalproex (DEPAKOTE) DR tablet 750 mg  750 mg Oral QHS McNew, Ileene HutchinsonHolly R, MD   750 mg at 04/08/18 2049  . haloperidol (HALDOL) tablet 5 mg  5 mg Oral Q6H PRN McNew, Ileene HutchinsonHolly R, MD  Or  . haloperidol lactate (HALDOL) injection 5 mg  5 mg Intramuscular Q6H PRN McNew, Ileene Hutchinson, MD   5 mg at 04/01/18 0819  . haloperidol (HALDOL) tablet 5 mg  5 mg Oral QHS McNew, Holly R, MD   5 mg at 04/08/18 2049  . hydrOXYzine (ATARAX/VISTARIL) tablet 50 mg  50 mg Oral TID PRN Clapacs, Jackquline Denmark, MD   50 mg at 04/04/18 2216  . LORazepam (ATIVAN) tablet 1 mg  1 mg Oral Q6H PRN Haskell Riling, MD   1 mg at 04/07/18 2157   Or  . LORazepam (ATIVAN) injection 1 mg  1 mg Intramuscular Q6H PRN Haskell Riling, MD   1 mg at 04/01/18 0820  . magnesium hydroxide (MILK OF MAGNESIA) suspension 30 mL  30 mL Oral Daily PRN  Clapacs, John T, MD      . traZODone (DESYREL) tablet 100 mg  100 mg Oral QHS PRN Clapacs, Jackquline Denmark, MD   100 mg at 04/08/18 2050     Discharge Medications: Please see discharge summary for a list of discharge medications.  Relevant Imaging Results:  Relevant Lab Results:   Additional Information TBI (traumatic brain injury), Patient will be referred to an ACTT Team once placement is secured.  Johny Shears, LCSW

## 2018-04-09 NOTE — BHH Group Notes (Signed)
BHH Group Notes:  (Nursing/MHT/Case Management/Adjunct)  Date:  04/09/2018  Time:  1:06 AM  Type of Therapy:  Group Therapy  Participation Level:  Did Not Attend    David NeighborsKeith D Allsion Davila 04/09/2018, 1:06 AM

## 2018-04-09 NOTE — Progress Notes (Signed)
Patient observed playing cards with peers in the day room this evening. Appropriate interaction noted, pt smiling and laughing. Patient also observed ambulating the hall with steady gait while conversing with a peer.

## 2018-04-09 NOTE — Progress Notes (Signed)
[facility] MD Progress Note  04/09/2018 1:45 PM [name]  MRN:  [id number] Subjective:  Pt is outside socializing with a peer today. He is trying to get out of his room more in the afternoons. He is much less irritable. He does not elaborate much during interview and is brief in responses. He states that he is "doing alright." He is sleeping fine. He states that [facility] are better. HE is not observed responding to internal stimuli as much. He feels his medications are helpful. He denies SI, HI. He states that he took a shower yesterday.   I spoke with his guardian today. Discussed that his CBC has improved slightly and that it may be better for him to continue [place] next month instead of transitioning to Haldol since he does much better on Invega. His outpatient providers will need to monitor CBC. She agrees with this. She also notified us that his group home will no longer be able to accept him back due to his agitation. The group home told her that they would be shut down by the state if they were to accept him back.   Principal Problem: Undifferentiated schizophrenia (HCC) Diagnosis:   Patient Active Problem List   Diagnosis Date Noted  . Undifferentiated schizophrenia (HCC) [F20.3] 03/26/2018    Priority: High  . TBI (traumatic brain injury) (HCC) [S06.9X9A] 07/10/2016  . Vitamin B12 deficiency [E53.8] 07/10/2016  . Noncompliance with medication regimen [Z91.14] 06/25/2016  . Cocaine use disorder, mild, abuse (HCC) [F14.10] 06/25/2016  . Cannabis use disorder, mild, abuse [F12.10] 06/25/2016  . Paranoid schizophrenia (HCC) [F20.0] 06/24/2016   Total Time spent with patient: 15 minutes  Past Psychiatric History: See h&P  Past Medical History:  Past Medical History:  Diagnosis Date  . Acute psychosis (HCC)   . Antisocial personality disorder (HCC)   . Cannabis abuse    Past  . Paranoid schizophrenia (HCC)   . Schizoaffective disorder, bipolar type (HCC)    History  reviewed. No pertinent surgical history. Family History: History reviewed. No pertinent family history. Family Psychiatric  History: See H&P Social History:  Social History   Substance and Sexual Activity  Alcohol Use No     Social History   Substance and Sexual Activity  Drug Use Not Currently    Social History   Socioeconomic History  . Marital status: Single    Spouse name: Not on file  . Number of children: Not on file  . Years of education: Not on file  . Highest education level: Not on file  Occupational History  . Not on file  Social Needs  . Financial resource strain: Not on file  . Food insecurity:    Worry: Not on file    Inability: Not on file  . Transportation needs:    Medical: Not on file    Non-medical: Not on file  Tobacco Use  . Smoking status: Former Smoker    Packs/day: 1.00    Types: Cigarettes  . Smokeless tobacco: Never Used  Substance and Sexual Activity  . Alcohol use: No  . Drug use: Not Currently  . Sexual activity: Not Currently  Lifestyle  . Physical activity:    Days per week: Not on file    Minutes per session: Not on file  . Stress: Not on file  Relationships  . Social connections:    Talks on phone: Not on file    Gets together: Not on file    Attends religious service:  Not on file    Active member of club or organization: Not on file    Attends meetings of clubs or organizations: Not on file    Relationship status: Not on file  Other Topics Concern  . Not on file  Social History Narrative  . Not on file   Additional Social History:                         Sleep: Good  Appetite:  Good  Current Medications: Current Facility-Administered Medications  Medication Dose Route Frequency Provider Last Rate Last Dose  . acetaminophen (TYLENOL) tablet 650 mg  650 mg Oral Q6H PRN Clapacs, John T, MD      . alum & mag hydroxide-simeth (MAALOX/MYLANTA) 200-200-20 MG/5ML suspension 30 mL  30 mL Oral Q4H PRN Clapacs,  John T, MD      . benztropine (COGENTIN) tablet 1 mg  1 mg Oral BID PRN Ameshia Pewitt, Ileene Hutchinson, MD   1 mg at 04/08/18 2048   Or  . benztropine mesylate (COGENTIN) injection 1 mg  1 mg Intramuscular BID PRN Maninder Deboer, Ileene Hutchinson, MD      . benztropine (COGENTIN) tablet 1 mg  1 mg Oral QHS Yarimar Lavis, Ileene Hutchinson, MD   1 mg at 04/08/18 2050  . divalproex (DEPAKOTE) DR tablet 1,000 mg  1,000 mg Oral BH-q7a Kriss Perleberg R, MD   1,000 mg at 04/09/18 0602   And  . divalproex (DEPAKOTE) DR tablet 750 mg  750 mg Oral QHS Cara Aguino R, MD   750 mg at 04/08/18 2049  . haloperidol (HALDOL) tablet 5 mg  5 mg Oral Q6H PRN Saleemah Mollenhauer, Ileene Hutchinson, MD       Or  . haloperidol lactate (HALDOL) injection 5 mg  5 mg Intramuscular Q6H PRN Jahn Franchini, Ileene Hutchinson, MD   5 mg at 04/01/18 0819  . haloperidol (HALDOL) tablet 5 mg  5 mg Oral QHS Alaila Pillard R, MD   5 mg at 04/08/18 2049  . hydrOXYzine (ATARAX/VISTARIL) tablet 50 mg  50 mg Oral TID PRN Clapacs, Jackquline Denmark, MD   50 mg at 04/04/18 2216  . LORazepam (ATIVAN) tablet 1 mg  1 mg Oral Q6H PRN Haskell Riling, MD   1 mg at 04/07/18 2157   Or  . LORazepam (ATIVAN) injection 1 mg  1 mg Intramuscular Q6H PRN Haskell Riling, MD   1 mg at 04/01/18 0820  . magnesium hydroxide (MILK OF MAGNESIA) suspension 30 mL  30 mL Oral Daily PRN Clapacs, Jackquline Denmark, MD      . traZODone (DESYREL) tablet 100 mg  100 mg Oral QHS PRN Clapacs, Jackquline Denmark, MD   100 mg at 04/08/18 2050    Lab Results:  Results for orders placed or performed during the hospital encounter of 03/26/18 (from the past 48 hour(s))  CBC with Differential/Platelet     Status: Abnormal   Collection Time: 04/08/18 12:25 PM  Result Value Ref Range   WBC 3.6 (L) 3.8 - 10.6 K/uL   RBC 4.22 (L) 4.40 - 5.90 MIL/uL   Hemoglobin 13.3 13.0 - 18.0 g/dL   HCT 27.2 (L) 53.6 - 64.4 %   MCV 91.1 80.0 - 100.0 fL   MCH 31.4 26.0 - 34.0 pg   MCHC 34.5 32.0 - 36.0 g/dL   RDW 03.4 74.2 - 59.5 %   Platelets 161 150 - 440 K/uL   Neutrophils Relative % 38 %   Neutro  Abs  1.4 1.4 - 6.5 K/uL   Lymphocytes Relative 38 %   Lymphs Abs 1.4 1.0 - 3.6 K/uL   Monocytes Relative 20 %   Monocytes Absolute 0.7 0.2 - 1.0 K/uL   Eosinophils Relative 3 %   Eosinophils Absolute 0.1 0 - 0.7 K/uL   Basophils Relative 1 %   Basophils Absolute 0.0 0 - 0.1 K/uL    Comment: Performed at [facility], [address] [place], [place] [address]    Blood Alcohol level:  Lab Results  Component Value Date   [facility] 03/25/2018   ETH <10 12/06/2017    Metabolic Disorder Labs: Lab Results  Component Value Date   HGBA1C 5.4 03/25/2018   MPG 108.28 03/25/2018   No results found for: PROLACTIN Lab Results  Component Value Date   CHOL 184 03/25/2018   TRIG 43 03/25/2018   HDL 50 03/25/2018   CHOLHDL 3.7 03/25/2018   VLDL 9 03/25/2018   LDLCALC 125 (H) 03/25/2018    Physical Findings: AIMS: Facial and Oral Movements Muscles of Facial Expression: None, normal Lips and Perioral Area: None, normal Jaw: None, normal Tongue: None, normal,Extremity Movements Upper (arms, wrists, hands, fingers): None, normal Lower (legs, knees, ankles, toes): None, normal, Trunk Movements Neck, shoulders, hips: None, normal, Overall Severity Severity of abnormal movements (highest score from questions above): None, normal Incapacitation due to abnormal movements: None, normal Patient's awareness of abnormal movements (rate only patient's report): No Awareness, Dental Status Current problems with teeth and/or dentures?: No Does patient usually wear dentures?: No  CIWA:  CIWA-Ar Total: 0 COWS:  COWS Total Score: 0  Musculoskeletal: Strength & Muscle Tone: within normal limits Gait & Station: normal Patient leans: N/A  Psychiatric Specialty Exam: Physical Exam  Nursing note and vitals reviewed.   Review of Systems  All other systems reviewed and are negative.   Blood pressure 102/64, pulse 78, temperature 98.2 F (36.8 C), temperature source Oral, resp. rate 16,  height 5\' 8"  (1.727 m), weight 58.1 kg, SpO2 98 %.Body mass index is 19.46 kg/m.  General Appearance: Casual  Eye Contact:  Poor, looking at floor  Speech:  Clear and Coherent  Volume:  Decreased  Mood:  Euthymic  Affect:  Appropriate  Thought Process:  Coherent and Goal Directed, does not elaborate much on questions  Orientation:  Full (Time, Place, and Person)  Thought Content:  Logical, no delusional content today  Suicidal Thoughts:  No  Homicidal Thoughts:  No  Memory:  Immediate;   Fair  Judgement:  Impaired  Insight:  Lacking  Psychomotor Activity:  Normal  Concentration:  Concentration: Poor  Recall:  FiservFair  Fund of Knowledge:  Fair  Language:  Fair  Akathisia:  No      Assets:  Resilience  ADL's:  Intact  Cognition:  WNL  Sleep:  Number of Hours: 7.45     Treatment Plan Summary: 37 yo male admitted due to aggression with group home staff. He has been very calm on the unit with no further episodes of aggression. Thought processes are improving. He is more social with a certain peer on the unit. Per his guardian, he is no longer able to return to his group home. She is in the process of finding him a new one.   Plan:  Schizoaffective disorder -Continue Gean Birchwoodnvega Sustenna, Repeat CBC showed improved ANC so recommend him to continue monthly INvega injections since this is the medication that has kept him the most stable. He  will need monitoring by outpatinet providers -Continue oral Haldol. There is the option of transitioning to Haldol Dec in the future if ANC decreases again  Neutropenia -Repeat CBC shows improved ANC of 1.4  Dispo -I spoke with his guardian today. She states that he is now unable to return to his group home and she is looking into alternative placement  Haskell Riling, MD 04/09/2018, 1:45 PM

## 2018-04-10 NOTE — Progress Notes (Signed)
Recreation Therapy Notes   Date: 04/10/2018  Time: 9:30 pm  Location: Outside  Behavioral response: Appropriate  Group Type: Leisure  Participation level: Active  Communication: Patient was social with peers and staff.  Comments: N/A  Nyoka Alcoser LRT/CTRS        Soila Printup 04/10/2018 12:59 PM

## 2018-04-10 NOTE — BHH Group Notes (Signed)
LCSW Group Therapy Note  04/10/2018 1:00 pm  Type of Therapy and Topic:  Group Therapy:  Feelings around Relapse and Recovery  Participation Level:  Did Not Attend   Description of Group:    Patients in this group will discuss emotions they experience before and after a relapse. They will process how experiencing these feelings, or avoidance of experiencing them, relates to having a relapse. Facilitator will guide patients to explore emotions they have related to recovery. Patients will be encouraged to process which emotions are more powerful. They will be guided to discuss the emotional reaction significant others in their lives may have to their relapse or recovery. Patients will be assisted in exploring ways to respond to the emotions of others without this contributing to a relapse.  Therapeutic Goals: 1. Patient will identify two or more emotions that lead to a relapse for them 2. Patient will identify two emotions that result when they relapse 3. Patient will identify two emotions related to recovery 4. Patient will demonstrate ability to communicate their needs through discussion and/or role plays   Summary of Patient Progress:  [name] was invited to today's group, but chose not to attend.   Therapeutic Modalities:   Cognitive Behavioral Therapy Solution-Focused Therapy Assertiveness Training Relapse Prevention Therapy   Alease FrameSonya S Lorah Kalina, LCSW 04/10/2018 1:56 PM

## 2018-04-10 NOTE — BHH Counselor (Signed)
Patient met with Randall Hiss who works with [facility] for an interview for their group home. Patient reports feeling good about the interview but that he did like his pervious group home. Patient reports that he is open to going to the new group home if accepted. CSW will keep the patient update on the placement decision.  Darin Engels, MSW, Latanya Presser, [place] Clinical Social Worker 04/10/2018 2:23 PM

## 2018-04-10 NOTE — BHH Group Notes (Signed)
BHH Group Notes:  (Nursing/MHT/Case Management/Adjunct)  Date:  04/10/2018  Time:  2:05 AM  Type of Therapy:  Group Therapy  Participation Level:  Did Not Attend  Caryn BeeJessica  [name] 04/10/2018, 2:05 AM

## 2018-04-10 NOTE — Progress Notes (Addendum)
Patient continues to display bizarre behavior and disturbed thought process. He is guarded and irritable. Unable to engage in a meaningful conversation. Was visible in the milieu until bedtime. Compliant with medications. Was encouraged to discuss his feelings  and concerns as needed. Currently in bed sleeping. Safety precautions maintained. [id number]: Patient slept throughout the night and was comfortable in bed. Refused to go to the dayroom for AM vital signs but has no major concern. Staff continue to monitor for safety.

## 2018-04-10 NOTE — Plan of Care (Signed)
Continues to display bizarre behavior and thought blocking

## 2018-04-10 NOTE — Progress Notes (Signed)
[facility]  04/10/2018 1:20 PM Melene MullerPaul Stephenson Dwyer Jr.  MRN:  [id number] Subjective:  Pt is otu of his room most of the morning today. He is seen pacing at times. He is in a good mood. Occasionally seen laughing to himself and responding to internal stimuli. He has been very calm and interacting well with others. He states that he is doing well. Discussed with him that a group home is coming to interview him today. He is happy to hear this. HE asks very appropriate questions about this including how he will get his belongings from the other house. He is more organized in thoughts. He feels his medications are helpful. He makes better eye contact today. He denies SI or HI. He also denies hearing voices. He denies side effects and denies feeling anxious or restless.   Principal Problem: Undifferentiated schizophrenia (HCC) Diagnosis:   Patient Active Problem List   Diagnosis Date Noted  . Undifferentiated schizophrenia (HCC) [F20.3] 03/26/2018    Priority: High  . TBI (traumatic brain injury) (HCC) [S06.9X9A] 07/10/2016  . Vitamin B12 deficiency [E53.8] 07/10/2016  . Noncompliance with medication regimen [Z91.14] 06/25/2016  . Cocaine use disorder, mild, abuse (HCC) [F14.10] 06/25/2016  . Cannabis use disorder, mild, abuse [F12.10] 06/25/2016  . 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 Acute psychosis (HCC)   . Antisocial personality disorder (HCC)   . Cannabis abuse    Past  . Paranoid schizophrenia (HCC)   . Schizoaffective disorder, bipolar type (HCC)    History reviewed. No pertinent surgical history. Family History: History reviewed. No pertinent family history. Family Psychiatric  History: See H&P Social History:  Social History   Substance and Sexual Activity  Alcohol Use No     Social History   Substance and  Sexual Activity  Drug Use Not Currently    Social History   Socioeconomic History  . Marital status: Single    Spouse name: Not on file  . Number of children: Not on file  . Years of education: Not on file  . Highest education level: Not on file  Occupational History  . Not on file  Social Needs  . Financial resource strain: Not on file  . Food insecurity:    Worry: Not on file    Inability: Not on file  . Transportation needs:    Medical: Not on file    Non-medical: Not on file  Tobacco Use  . Smoking status: Former Smoker    Packs/day: 1.00    Types: Cigarettes  . Smokeless tobacco: Never Used  Substance and Sexual Activity  . Alcohol use: No  . Drug use: Not Currently  . Sexual activity: Not Currently  Lifestyle  . Physical activity:    Days per week: Not on file    Minutes per session: Not on file  . Stress: Not on file  Relationships  . Social connections:    Talks on phone: Not on file    Gets together: Not on file    Attends religious service: Not on file    Active member of club or organization: Not on file    Attends meetings of clubs or organizations: Not on file    Relationship status: Not on file  Other Topics Concern  . Not on file  Social History Narrative  . Not on file  Additional Social History:                         Sleep: Good-he states that the door wakes him up for 15 min checks  Appetite:  Good  Current Medications: Current Facility-Administered Medications  Medication Dose Route Frequency Provider Last Rate Last Dose  . acetaminophen (TYLENOL) tablet 650 mg  650 mg Oral Q6H PRN Clapacs, John T, MD      . alum & mag hydroxide-simeth (MAALOX/MYLANTA) 200-200-20 MG/5ML suspension 30 mL  30 mL Oral Q4H PRN Clapacs, John T, MD      . benztropine (COGENTIN) tablet 1 mg  1 mg Oral BID PRN Jaleal Schliep, Ileene Hutchinson, MD   1 mg at 04/08/18 2048   Or  . benztropine mesylate (COGENTIN) injection 1 mg  1 mg Intramuscular BID PRN Mohanad Carsten, Ileene Hutchinson, MD      . benztropine (COGENTIN) tablet 1 mg  1 mg Oral QHS Alrick Cubbage, Ileene Hutchinson, MD   1 mg at 04/09/18 2147  . divalproex (DEPAKOTE) DR tablet 1,000 mg  1,000 mg Oral BH-q7a Nathalee Smarr R, MD   1,000 mg at 04/10/18 0745   And  . divalproex (DEPAKOTE) DR tablet 750 mg  750 mg Oral QHS Kasten Leveque R, MD   750 mg at 04/09/18 2147  . haloperidol (HALDOL) tablet 5 mg  5 mg Oral Q6H PRN Takeisha Cianci, Ileene Hutchinson, MD       Or  . haloperidol lactate (HALDOL) injection 5 mg  5 mg Intramuscular Q6H PRN Feliz Lincoln, Ileene Hutchinson, MD   5 mg at 04/01/18 0819  . haloperidol (HALDOL) tablet 5 mg  5 mg Oral QHS Dalan Cowger R, MD   5 mg at 04/09/18 2147  . hydrOXYzine (ATARAX/VISTARIL) tablet 50 mg  50 mg Oral TID PRN Clapacs, Jackquline Denmark, MD   50 mg at 04/04/18 2216  . LORazepam (ATIVAN) tablet 1 mg  1 mg Oral Q6H PRN Haskell Riling, MD   1 mg at 04/07/18 2157   Or  . LORazepam (ATIVAN) injection 1 mg  1 mg Intramuscular Q6H PRN Haskell Riling, MD   1 mg at 04/01/18 0820  . magnesium hydroxide (MILK OF MAGNESIA) suspension 30 mL  30 mL Oral Daily PRN Clapacs, John T, MD      . traZODone (DESYREL) tablet 100 mg  100 mg Oral QHS PRN Clapacs, Jackquline Denmark, MD   100 mg at 04/08/18 2050    Lab Results: No results found for this or any previous visit (from the past 48 hour(s)).  Blood Alcohol level:  Lab Results  Component Value Date   ETH <10 03/25/2018   ETH <10 12/06/2017    Metabolic Disorder Labs: Lab Results  Component Value Date   HGBA1C 5.4 03/25/2018   MPG 108.28 03/25/2018   No results found for: PROLACTIN Lab Results  Component Value Date   CHOL 184 03/25/2018   TRIG 43 03/25/2018   HDL 50 03/25/2018   CHOLHDL 3.7 03/25/2018   VLDL 9 03/25/2018   LDLCALC 125 (H) 03/25/2018    Physical Findings: AIMS: Facial and Oral Movements Muscles of Facial Expression: None, normal Lips and Perioral Area: None, normal Jaw: None, normal Tongue: None, normal,Extremity Movements Upper (arms, wrists, hands, fingers): None,  normal Lower (legs, knees, ankles, toes): None, normal, Trunk Movements Neck, shoulders, hips: None, normal, Overall Severity Severity of abnormal movements (highest score from questions above): None, normal Incapacitation due to abnormal  movements: None, normal Patient's awareness of abnormal movements (rate only patient's report): No Awareness, Dental Status Current problems with teeth and/or dentures?: No Does patient usually wear dentures?: No  CIWA:  CIWA-Ar Total: 0 COWS:  COWS Total Score: 0  Musculoskeletal: Strength & Muscle Tone: within normal limits Gait & Station: normal Patient leans: N/A  Psychiatric Specialty Exam: Physical Exam  Nursing note and vitals reviewed.   Review of Systems  All other systems reviewed and are negative.   Blood pressure (!) 98/56, pulse (!) 56, temperature 97.6 F (36.4 C), temperature source Oral, resp. rate 18, height 5\' 8"  (1.727 m), weight 58.1 kg, SpO2 100 %.Body mass index is 19.46 kg/m.  General Appearance: malodorous  Eye Contact:  Fair  Speech:  Clear and Coherent  Volume:  Normal  Mood:  Euthymic  Affect:  Appropriate  Thought Process:  Coherent and Goal Directed  Orientation:  Full (Time, Place, and Person)  Thought Content:  Logical  Suicidal Thoughts:  No  Homicidal Thoughts:  No  Memory:  Immediate;   Fair  Judgement:  Impaired  Insight:  Lacking  Psychomotor Activity:  Increased, pacing, denies feeling restless  Concentration:  Concentration: Fair  Recall:  [employer] of Knowledge:  Fair  Language:  Fair  Akathisia:  No, pacing but denies feeling restless      Assets:  Resilience  ADL's:  Intact  Cognition:  WNL  Sleep:  Number of Hours: 6.5     Treatment Plan Summary: 37 yo male admitted due to agitation and aggression toward group home staff. Group home will now not allow him to return. He is still observed responding to internal stimuli at times but he denies AH. He is otu of his room more and social with  another peer. HE is able to ask appropriate questions through interview. He has not had any episodes of aggression on the unit since admission.   Plan:  Schizoaffective disorder -Continue Invega Sustenna q28 days. Repeat CBC showed improved ANC. This will need monitoring with his outpatient provider -Continue oral Haldol. There is an option of transitioning to Haldol Dec in the future if ANC decreases again  Neutropenia -CBC whos improved ANC of 1.4  Dispo -I spoke with his guardian yesterday. He has interview with new group home today   Haskell Riling, MD 04/10/2018, 1:20 PM

## 2018-04-10 NOTE — Plan of Care (Signed)
Patient alert and oriented to self, place and situation today with less irritability.  Took his Depakote this morning and ambulated the unit with steady gait. Patient observed periodically responding to internal stimuli while ambulating the unit. Patient also observed interacting with a certain peer on the unit. Patient exhibited positive social interaction yesterday evening when he was observed interacting with staff and peers playing cards. Patient out of the room more today in comparison to previous dates. Denies wanting to hurt himself or anyone else. Denies depression and anxiety. Milieu remains safe with Q 15 minute safety checks.

## 2018-04-10 NOTE — Tx Team (Signed)
Interdisciplinary Treatment and Diagnostic Plan Update  04/10/2018 Time of Session: 11:30am David Davila. MRN: [id number]  Principal Diagnosis: Undifferentiated schizophrenia (HCC)  Secondary Diagnoses: Principal Problem:   Undifferentiated schizophrenia (HCC)   Current Medications:  Current Facility-Administered Medications  Medication Dose Route Frequency Provider Last Rate Last Dose  . acetaminophen (TYLENOL) tablet 650 mg  650 mg Oral Q6H PRN Clapacs, John T, MD      . alum & mag hydroxide-simeth (MAALOX/MYLANTA) 200-200-20 MG/5ML suspension 30 mL  30 mL Oral Q4H PRN Clapacs, John T, MD      . benztropine (COGENTIN) tablet 1 mg  1 mg Oral BID PRN McNew, Ileene Hutchinson, MD   1 mg at 04/08/18 2048   Or  . benztropine mesylate (COGENTIN) injection 1 mg  1 mg Intramuscular BID PRN McNew, Ileene Hutchinson, MD      . benztropine (COGENTIN) tablet 1 mg  1 mg Oral QHS McNew, Ileene Hutchinson, MD   1 mg at 04/09/18 2147  . divalproex (DEPAKOTE) DR tablet 1,000 mg  1,000 mg Oral BH-q7a McNew, Holly R, MD   1,000 mg at 04/10/18 0745   And  . divalproex (DEPAKOTE) DR tablet 750 mg  750 mg Oral QHS McNew, Holly R, MD   750 mg at 04/09/18 2147  . haloperidol (HALDOL) tablet 5 mg  5 mg Oral Q6H PRN McNew, Ileene Hutchinson, MD       Or  . haloperidol lactate (HALDOL) injection 5 mg  5 mg Intramuscular Q6H PRN McNew, Ileene Hutchinson, MD   5 mg at 04/01/18 0819  . haloperidol (HALDOL) tablet 5 mg  5 mg Oral QHS McNew, Holly R, MD   5 mg at 04/09/18 2147  . hydrOXYzine (ATARAX/VISTARIL) tablet 50 mg  50 mg Oral TID PRN Clapacs, Jackquline Denmark, MD   50 mg at 04/04/18 2216  . LORazepam (ATIVAN) tablet 1 mg  1 mg Oral Q6H PRN Haskell Riling, MD   1 mg at 04/07/18 2157   Or  . LORazepam (ATIVAN) injection 1 mg  1 mg Intramuscular Q6H PRN Haskell Riling, MD   1 mg at 04/01/18 0820  . magnesium hydroxide (MILK OF MAGNESIA) suspension 30 mL  30 mL Oral Daily PRN Clapacs, John T, MD      . traZODone (DESYREL) tablet 100 mg  100 mg Oral QHS PRN  Clapacs, Jackquline Denmark, MD   100 mg at 04/08/18 2050   PTA Medications: Medications Prior to Admission  Medication Sig Dispense Refill Last Dose  . atorvastatin (LIPITOR) 10 MG tablet Take 1 tablet (10 mg total) by mouth daily at 6 PM. (Patient not taking: Reported on 11/16/2017) 30 tablet 0 Not Taking at Unknown time  . divalproex (DEPAKOTE ER) 500 MG 24 hr tablet Take 1,000 mg by mouth daily.   unknown at unknown  . hydrOXYzine (ATARAX/VISTARIL) 25 MG tablet Take 1 tablet (25 mg total) by mouth 3 (three) times daily. (Patient not taking: Reported on 11/16/2017) 30 tablet 0 Not Taking at Unknown time  . lamoTRIgine (LAMICTAL) 100 MG tablet Take 1 tablet (100 mg total) by mouth every evening. (Patient not taking: Reported on 11/16/2017) 30 tablet 0 Not Taking at Unknown time  . lamoTRIgine (LAMICTAL) 25 MG tablet Take 2 tablets (50 mg total) by mouth daily. (Patient not taking: Reported on 11/16/2017) 60 tablet 0 Not Taking at Unknown time  . loratadine (CLARITIN) 10 MG tablet Take 10 mg by mouth daily.   unknown at unknown  .  LORazepam (ATIVAN) 0.5 MG tablet Take 1 tablet (0.5 mg total) by mouth 2 (two) times daily. (Patient not taking: Reported on 11/16/2017) 60 tablet 0 Not Taking at Unknown time  . OLANZapine zydis (ZYPREXA) 5 MG disintegrating tablet Take 1 tablet (5 mg total) by mouth at bedtime. (Patient not taking: Reported on 11/16/2017) 30 tablet 0 Not Taking at Unknown time  . paliperidone (INVEGA SUSTENNA) 156 MG/ML SUSP injection Inject 1 mL (156 mg total) into the muscle every 28 (twenty-eight) days. NEXT DOSE DUE August 13, 2016 (Patient taking differently: Inject 156 mg into the muscle every 28 (twenty-eight) days. ) 0.9 mL 0   . traZODone (DESYREL) 100 MG tablet Take 1 tablet (100 mg total) by mouth at bedtime. (Patient taking differently: Take 50 mg by mouth at bedtime. ) 30 tablet 0 Not Taking at Unknown time    Patient Stressors: Medication change or noncompliance Substance abuse  Patient  Strengths: General fund of knowledge  Treatment Modalities: Medication Management, Group therapy, Case management,  1 to 1 session with clinician, Psychoeducation, Recreational therapy.   Physician Treatment Plan for Primary Diagnosis: Undifferentiated schizophrenia (HCC) Long Term Goal(s): Improvement in symptoms so as ready for discharge   Short Term Goals: Ability to identify changes in lifestyle to reduce recurrence of condition will improve  Medication Management: Evaluate patient's response, side effects, and tolerance of medication regimen.  Therapeutic Interventions: 1 to 1 sessions, Unit Group sessions and Medication administration.  Evaluation of Outcomes: Progressing  Physician Treatment Plan for Secondary Diagnosis: Principal Problem:   Undifferentiated schizophrenia (HCC)  Long Term Goal(s): Improvement in symptoms so as ready for discharge   Short Term Goals: Ability to identify changes in lifestyle to reduce recurrence of condition will improve     Medication Management: Evaluate patient's response, side effects, and tolerance of medication regimen.  Therapeutic Interventions: 1 to 1 sessions, Unit Group sessions and Medication administration.  Evaluation of Outcomes: Progressing   RN Treatment Plan for Primary Diagnosis: Undifferentiated schizophrenia (HCC) Long Term Goal(s): Knowledge of disease and therapeutic regimen to maintain health will improve  Short Term Goals: Ability to verbalize feelings will improve, Ability to identify and develop effective coping behaviors will improve and Compliance with prescribed medications will improve  Medication Management: RN will administer medications as ordered by provider, will assess and evaluate patient's response and provide education to patient for prescribed medication. RN will report any adverse and/or side effects to prescribing provider.  Therapeutic Interventions: 1 on 1 counseling sessions, Psychoeducation,  Medication administration, Evaluate responses to treatment, Monitor vital signs and CBGs as ordered, Perform/monitor CIWA, COWS, AIMS and Fall Risk screenings as ordered, Perform wound care treatments as ordered.  Evaluation of Outcomes: Progressing   LCSW Treatment Plan for Primary Diagnosis: Undifferentiated schizophrenia (HCC) Long Term Goal(s): Safe transition to appropriate next level of care at discharge, Engage patient in therapeutic group addressing interpersonal concerns.  Short Term Goals: Engage patient in aftercare planning with referrals and resources, Increase social support, Increase emotional regulation, Identify triggers associated with mental health/substance abuse issues and Increase skills for wellness and recovery  Therapeutic Interventions: Assess for all discharge needs, 1 to 1 time with Social worker, Explore available resources and support systems, Assess for adequacy in community support network, Educate family and significant other(s) on suicide prevention, Complete Psychosocial Assessment, Interpersonal group therapy.  Evaluation of Outcomes: Progressing   Progress in Treatment: Attending groups: Yes. Participating in groups: No. Taking medication as prescribed: Yes. Toleration medication: Yes. Family/Significant other  contact made: Yes, individual(s) contacted:  Patients Guardian Patient understands diagnosis: Yes. Discussing patient identified problems/goals with staff: Yes. Medical problems stabilized or resolved: Yes. Denies suicidal/homicidal ideation: Yes. Issues/concerns per patient self-inventory: No. Other:   New problem(s) identified: No, Describe:  None  New Short Term/Long Term Goal(s): "Take care of my family."  Patient Goals:   "Take care of my family."  Discharge Plan or Barriers: To discharge to a new group home and engage with outpatient services or ACT Team  Reason for Continuation of Hospitalization: Medication  stabilization  Estimated Length of Stay: 5-7 days Recreational Therapy: Patient Stressors: N/A Patient Goal: Patient will engage in interactions with peers and staff in pro-social manner at least 2x within 5 recreation therapy group sessions   Attendees: Patient:  04/10/2018 11:37 AM  Physician: Corinna GabHolly McNew, MD 04/10/2018 11:37 AM  Nursing: 04/10/2018 11:37 AM  RN Care Manager: 04/10/2018 11:37 AM  Social Worker: Johny Shearsassandra Tahisha Hakim, LCSWA 04/10/2018 11:37 AM  Recreational Therapist: Danella DeisShay. Dreama SaaOutlaw CTRS, LRT 04/10/2018 11:37 AM  Other:  04/10/2018 11:37 AM  Other:  04/10/2018 11:37 AM  Other: 04/10/2018 11:37 AM    Scribe for Treatment Team: Johny Shearsassandra  Claudia Greenley, LCSW 04/10/2018 11:37 AM

## 2018-04-11 NOTE — Progress Notes (Signed)
Patient ID: Melene Mulleraul Stephenson Horvath Jr., male   DOB: 07/29/1981, 37 y.o.   MRN: [id number] PER STATE REGULATIONS 482.30  THIS CHART WAS REVIEWED FOR MEDICAL NECESSITY WITH RESPECT TO THE PATIENT'S ADMISSION/DURATION OF STAY.  NEXT REVIEW DATE:04/15/18  Loura HaltBARBARA Keaundra Stehle, RN, BSN CASE MANAGER

## 2018-04-11 NOTE — [facility] Group Notes (Signed)
LCSW Group Therapy Note  04/11/2018 1:15pm  Type of Therapy and Topic:  Group Therapy:  Cognitive Distortions  Participation Level:  Did Not Attend   Description of Group:    Patients in this group will be introduced to the topic of cognitive distortions.  Patients will identify and describe cognitive distortions, describe the feelings these distortions create for them.  Patients will identify one or more situations in their personal life where they have cognitively distorted thinking and will verbalize challenging this cognitive distortion through positive thinking skills.  Patients will practice the skill of using positive affirmations to challenge cognitive distortions using affirmation cards.    Therapeutic Goals:  1. Patient will identify two or more cognitive distortions they have used 2. Patient will identify one or more emotions that stem from use of a cognitive distortion 3. Patient will demonstrate use of a positive affirmation to counter a cognitive distortion through discussion and/or role play. 4. Patient will describe one way cognitive distortions can be detrimental to wellness   Summary of Patient Progress: Pt was invited to attend group but chose not to attend. CSW will continue to encourage pt to attend group throughout their admission.      Therapeutic Modalities:   Cognitive Behavioral Therapy Motivational Interviewing   Lashala Laser  CUEBAS-COLON, LCSW 04/11/2018 12:47 PM   

## 2018-04-11 NOTE — Plan of Care (Signed)
Patient is improving in all areas , with out any incidents, socializing well with peers, compliant with his medicines, patient states that his medicine is working well for him, have no complains , sleep long hours, improve in his hygiene, attends groups with participation , support is provided remind patient of his coping skills and safety is maintained , denies any SI/Hi and patent denies hearing voices, 15 minute safety rounds is in progress no distress. Problem: Education: Goal: Knowledge of [facility] information/materials will improve Outcome: Progressing Goal: Emotional status will improve Outcome: Progressing Goal: Mental status will improve Outcome: Progressing Goal: Verbalization of understanding the information provided will improve Outcome: Progressing   Problem: Activity: Goal: Interest or engagement in activities will improve Outcome: Progressing Goal: Sleeping patterns will improve Outcome: Progressing   Problem: Coping: Goal: Ability to verbalize frustrations and anger appropriately will improve Outcome: Progressing Goal: Ability to demonstrate self-control will improve Outcome: Progressing   Problem: Health Behavior/Discharge Planning: Goal: Identification of resources available to assist in meeting health care needs will improve Outcome: Progressing Goal: Compliance with treatment plan for underlying cause of condition will improve Outcome: Progressing   Problem: Physical Regulation: Goal: Ability to maintain clinical measurements within normal limits will improve Outcome: Progressing   Problem: Safety: Goal: Periods of time without injury will increase Outcome: Progressing   Problem: Activity: Goal: Will verbalize the importance of balancing activity with adequate rest periods Outcome: Progressing   Problem: Education: Goal: Will be free of psychotic symptoms Outcome: Progressing Goal: Knowledge of the prescribed therapeutic regimen  will improve Outcome: Progressing   Problem: Coping: Goal: Coping ability will improve Outcome: Progressing Goal: Will verbalize feelings Outcome: Progressing   Problem: Health Behavior/Discharge Planning: Goal: Compliance with prescribed medication regimen will improve Outcome: Progressing   Problem: Nutritional: Goal: Ability to achieve adequate nutritional intake will improve Outcome: Progressing   Problem: Role Relationship: Goal: Ability to communicate needs accurately will improve Outcome: Progressing Goal: Ability to interact with others will improve Outcome: Progressing   Problem: Safety: Goal: Ability to redirect hostility and anger into socially appropriate behaviors will improve Outcome: Progressing Goal: Ability to remain free from injury will improve Outcome: Progressing   Problem: Self-Care: Goal: Ability to participate in self-care as condition permits will improve Outcome: Progressing   Problem: Self-Concept: Goal: Will verbalize positive feelings about self Outcome: Progressing   Problem: Safety: Goal: Ability to remain free from injury will improve Outcome: Progressing

## 2018-04-11 NOTE — Progress Notes (Signed)
Received David Davila this AM after breakfast. He denied all of the psychiatric symptoms this morning. He is observed OOB in the milieu at intervals socializing with his peers. No change in his status this PM.

## 2018-04-11 NOTE — Progress Notes (Signed)
[facility]  04/11/2018 3:20 PM David MullerPaul Davila David Jr.  MRN:  [id number] Subjective:  Pt is seen walking in the hallway. His affect is quite flat. David Davila denied any SI or HI, or AVH.  David Davila expressed the desire of living with parents again, and did not seem to want to go to a group home, but is accepting that David Davila might have to.   David Davila tolerates the meds without complaint.   Principal Problem: Undifferentiated schizophrenia (HCC) Diagnosis:   Patient Active Problem List   Diagnosis Date Noted  . Undifferentiated schizophrenia (HCC) [F20.3] 03/26/2018  . TBI (traumatic brain injury) (HCC) [S06.9X9A] 07/10/2016  . Vitamin B12 deficiency [E53.8] 07/10/2016  . Noncompliance with medication regimen [Z91.14] 06/25/2016  . Cocaine use disorder, mild, abuse (HCC) [F14.10] 06/25/2016  . Cannabis use disorder, mild, abuse [F12.10] 06/25/2016  . 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 Acute psychosis (HCC)   . Antisocial personality disorder (HCC)   . Cannabis abuse    Past  . Paranoid schizophrenia (HCC)   . Schizoaffective disorder, bipolar type (HCC)    History reviewed. No pertinent surgical history. Family History: History reviewed. No pertinent family history. Family Psychiatric  History: See H&P Social History:  Social History   Substance and Sexual Activity  Alcohol Use No     Social History   Substance and Sexual Activity  Drug Use Not Currently    Social History   Socioeconomic History  . Marital status: Single    Spouse name: Not on file  . Number of children: Not on file  . Years of education: Not on file  . Highest education level: Not on file  Occupational History  . Not on file  Social Needs  . Financial resource strain: Not on file  . Food insecurity:    Worry: Not on file    Inability: Not on file   . Transportation needs:    Medical: Not on file    Non-medical: Not on file  Tobacco Use  . Smoking status: Former Smoker    Packs/day: 1.00    Types: Cigarettes  . Smokeless tobacco: Never Used  Substance and Sexual Activity  . Alcohol use: No  . Drug use: Not Currently  . Sexual activity: Not Currently  Lifestyle  . Physical activity:    Days per week: Not on file    Minutes per session: Not on file  . Stress: Not on file  Relationships  . Social connections:    Talks on phone: Not on file    Gets together: Not on file    Attends religious service: Not on file    Active member of club or organization: Not on file    Attends meetings of clubs or organizations: Not on file    Relationship status: Not on file  Other Topics Concern  . Not on file  Social History Narrative  . Not on file   Additional Social History:   Sleep: Good-David Davila states that the door wakes him up for 15 min checks  Appetite:  Good  Current Medications: Current Facility-Administered Medications  Medication Dose Route Frequency Provider Last Rate Last Dose  . acetaminophen (TYLENOL) tablet 650 mg  650 mg Oral Q6H PRN Clapacs, John T, MD      . alum & mag hydroxide-simeth (MAALOX/MYLANTA) 200-200-20 MG/5ML suspension 30  mL  30 mL Oral Q4H PRN Clapacs, John T, MD      . benztropine (COGENTIN) tablet 1 mg  1 mg Oral BID PRN McNew, Ileene Hutchinson, MD   1 mg at 04/08/18 2048   Or  . benztropine mesylate (COGENTIN) injection 1 mg  1 mg Intramuscular BID PRN McNew, Ileene Hutchinson, MD      . benztropine (COGENTIN) tablet 1 mg  1 mg Oral QHS McNew, Ileene Hutchinson, MD   1 mg at 04/10/18 2122  . divalproex (DEPAKOTE) DR tablet 1,000 mg  1,000 mg Oral BH-q7a McNew, Holly R, MD   1,000 mg at 04/11/18 0549   And  . divalproex (DEPAKOTE) DR tablet 750 mg  750 mg Oral QHS McNew, Holly R, MD   750 mg at 04/10/18 2122  . haloperidol (HALDOL) tablet 5 mg  5 mg Oral Q6H PRN McNew, Ileene Hutchinson, MD       Or  . haloperidol lactate (HALDOL)  injection 5 mg  5 mg Intramuscular Q6H PRN McNew, Ileene Hutchinson, MD   5 mg at 04/01/18 0819  . haloperidol (HALDOL) tablet 5 mg  5 mg Oral QHS McNew, Holly R, MD   5 mg at 04/10/18 2122  . hydrOXYzine (ATARAX/VISTARIL) tablet 50 mg  50 mg Oral TID PRN Audery Amel, MD   50 mg at 04/04/18 2216  . LORazepam (ATIVAN) tablet 1 mg  1 mg Oral Q6H PRN Haskell Riling, MD   1 mg at 04/07/18 2157   Or  . LORazepam (ATIVAN) injection 1 mg  1 mg Intramuscular Q6H PRN Haskell Riling, MD   1 mg at 04/01/18 0820  . magnesium hydroxide (MILK OF MAGNESIA) suspension 30 mL  30 mL Oral Daily PRN Clapacs, John T, MD      . traZODone (DESYREL) tablet 100 mg  100 mg Oral QHS PRN Clapacs, Jackquline Denmark, MD   100 mg at 04/08/18 2050    Lab Results: No results found for this or any previous visit (from the past 48 hour(s)).  Blood Alcohol level:  Lab Results  Component Value Date   ETH <10 03/25/2018   ETH <10 12/06/2017    Metabolic Disorder Labs: Lab Results  Component Value Date   HGBA1C 5.4 03/25/2018   MPG 108.28 03/25/2018   No results found for: PROLACTIN Lab Results  Component Value Date   CHOL 184 03/25/2018   TRIG 43 03/25/2018   HDL 50 03/25/2018   CHOLHDL 3.7 03/25/2018   VLDL 9 03/25/2018   LDLCALC 125 (H) 03/25/2018    Physical Findings: AIMS: Facial and Oral Movements Muscles of Facial Expression: None, normal Lips and Perioral Area: None, normal Jaw: None, normal Tongue: None, normal,Extremity Movements Upper (arms, wrists, hands, fingers): None, normal Lower (legs, knees, ankles, toes): None, normal, Trunk Movements Neck, shoulders, hips: None, normal, Overall Severity Severity of abnormal movements (highest score from questions above): None, normal Incapacitation due to abnormal movements: None, normal Patient's awareness of abnormal movements (rate only patient's report): No Awareness, Dental Status Current problems with teeth and/or dentures?: No Does patient usually wear dentures?:  No  CIWA:  CIWA-Ar Total: 0 COWS:  COWS Total Score: 0  Musculoskeletal: Strength & Muscle Tone: within normal limits Gait & Station: normal Patient leans: N/A  Psychiatric Specialty Exam: Physical Exam  Nursing note and vitals reviewed.   Review of Systems  All other systems reviewed and are negative.   Blood pressure 107/63, pulse 66, temperature 97.8 F (  36.6 C), temperature source Oral, resp. rate 16, height 5\' 8"  (1.727 m), weight 58.1 kg, SpO2 100 %.Body mass index is 19.46 kg/m.  General Appearance: fairly groomed   Eye Contact:  Fair  Speech:  Clear and Coherent and Normal Rate  Volume:  Normal  Mood:  Euthymic  Affect:  Appropriate, Blunt and Flat  Thought Process:  Coherent and Goal Directed  Orientation:  Full (Time, Place, and Person)  Thought Content:  Logical  Suicidal Thoughts:  No  Homicidal Thoughts:  No  Memory:  Immediate;   Fair  Judgement:  Impaired  Insight:  Lacking  Psychomotor Activity:  Normal  Concentration:  Concentration: Fair  Recall:  [employer] of Knowledge:  Fair  Language:  Fair  Akathisia:  No,      Assets:  Resilience  ADL's:  Intact  Cognition:  WNL  Sleep:  Number of Hours: 6     Treatment Plan Summary: 37 yo male admitted due to agitation and aggression toward group home staff. Group home will now not allow him to return. Virgen Belland is still observed responding to internal stimuli at times but Tashayla Therien denies AH. Doak Mah is otu of his room more and social with another peer. Brannon Levene is able to ask appropriate questions through interview. Yuliya Nova has not had any episodes of aggression on the unit since admission.   Plan:  Schizoaffective disorder -Continue Invega Sustenna q28 days. Repeat CBC showed improved ANC. This will need monitoring with his outpatient provider -Continue oral Haldol. There is an option of transitioning to Haldol Dec in the future if ANC decreases again  Neutropenia -CBC whos improved ANC of 1.4  Dispo -the primary team has  been in contact with his guardian, and in the process of looking for a group home.    Ariez Neilan, MD 04/11/2018, 3:20 PM

## 2018-04-12 NOTE — Plan of Care (Signed)
Patient is calm and improving his coping skills and verbalizing positive feeling about him self, attends groups and taking his medicines, no complains, patient is safe and sleep long hours no distress denies SI/HI/AVH, responding well to treatments.   Problem: Education: Goal: Knowledge of [facility] Education information/materials will improve Outcome: Progressing Goal: Emotional status will improve Outcome: Progressing Goal: Mental status will improve Outcome: Progressing Goal: Verbalization of understanding the information provided will improve Outcome: Progressing   Problem: Activity: Goal: Interest or engagement in activities will improve Outcome: Progressing Goal: Sleeping patterns will improve Outcome: Progressing   Problem: Coping: Goal: Ability to verbalize frustrations and anger appropriately will improve Outcome: Progressing Goal: Ability to demonstrate self-control will improve Outcome: Progressing   Problem: Health Behavior/Discharge Planning: Goal: Identification of resources available to assist in meeting health care needs will improve Outcome: Progressing Goal: Compliance with treatment plan for underlying cause of condition will improve Outcome: Progressing   Problem: Physical Regulation: Goal: Ability to maintain clinical measurements within normal limits will improve Outcome: Progressing   Problem: Safety: Goal: Periods of time without injury will increase Outcome: Progressing   Problem: Activity: Goal: Will verbalize the importance of balancing activity with adequate rest periods Outcome: Progressing   Problem: Education: Goal: Will be free of psychotic symptoms Outcome: Progressing Goal: Knowledge of the prescribed therapeutic regimen will improve Outcome: Progressing   Problem: Coping: Goal: Coping ability will improve Outcome: Progressing Goal: Will verbalize feelings Outcome: Progressing   Problem: Health Behavior/Discharge  Planning: Goal: Compliance with prescribed medication regimen will improve Outcome: Progressing   Problem: Nutritional: Goal: Ability to achieve adequate nutritional intake will improve Outcome: Progressing   Problem: Role Relationship: Goal: Ability to communicate needs accurately will improve Outcome: Progressing Goal: Ability to interact with others will improve Outcome: Progressing   Problem: Safety: Goal: Ability to redirect hostility and anger into socially appropriate behaviors will improve Outcome: Progressing Goal: Ability to remain free from injury will improve Outcome: Progressing   Problem: Self-Care: Goal: Ability to participate in self-care as condition permits will improve Outcome: Progressing   Problem: Self-Concept: Goal: Will verbalize positive feelings about self Outcome: Progressing   Problem: Safety: Goal: Ability to remain free from injury will improve Outcome: Progressing

## 2018-04-12 NOTE — Progress Notes (Signed)
[facility] MD Progress Note  04/12/2018 2:08 PM David Davila.  MRN:  [id number]   Subjective:  Pt is seen in his room.  [name] reports feeling "ok", and agreed to go to our Group.  [name] was later on seen  walking in the hallway. His affect is still quite flat.  [name] denied any SI or HI, or AVH.   Minimal agitation.   [name] tolerates the meds without complaint.   Principal Problem: Undifferentiated schizophrenia (HCC) Diagnosis:   Patient Active Problem List   Diagnosis Date Noted  . Undifferentiated schizophrenia (HCC) [F20.3] 03/26/2018  . TBI (traumatic brain injury) (HCC) [S06.9X9A] 07/10/2016  . Vitamin B12 deficiency [E53.8] 07/10/2016  . Noncompliance with medication regimen [Z91.14] 06/25/2016  . Cocaine use disorder, mild, abuse (HCC) [F14.10] 06/25/2016  . Cannabis use disorder, mild, abuse [F12.10] 06/25/2016  . 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 Acute psychosis (HCC)   . Antisocial personality disorder (HCC)   . Cannabis abuse    Past  . Paranoid schizophrenia (HCC)   . Schizoaffective disorder, bipolar type (HCC)    History reviewed. No pertinent surgical history. Family History: History reviewed. No pertinent family history. Family Psychiatric  History: See H&P Social History:  Social History   Substance and Sexual Activity  Alcohol Use No     Social History   Substance and Sexual Activity  Drug Use Not Currently    Social History   Socioeconomic History  . Marital status: Single    Spouse name: Not on file  . Number of children: Not on file  . Years of education: Not on file  . Highest education level: Not on file  Occupational History  . Not on file  Social Needs  . Financial resource strain: Not on file  . Food insecurity:    Worry: Not on file    Inability: Not on file  . Transportation  needs:    Medical: Not on file    Non-medical: Not on file  Tobacco Use  . Smoking status: Former Smoker    Packs/day: 1.00    Types: Cigarettes  . Smokeless tobacco: Never Used  Substance and Sexual Activity  . Alcohol use: No  . Drug use: Not Currently  . Sexual activity: Not Currently  Lifestyle  . Physical activity:    Days per week: Not on file    Minutes per session: Not on file  . Stress: Not on file  Relationships  . Social connections:    Talks on phone: Not on file    Gets together: Not on file    Attends religious service: Not on file    Active member of club or organization: Not on file    Attends meetings of clubs or organizations: Not on file    Relationship status: Not on file  Other Topics Concern  . Not on file  Social History Narrative  . Not on file   Additional Social History:   Sleep: Good-[name] states that the door wakes him up for 15 min checks  Appetite:  Good  Current Medications: Current Facility-Administered Medications  Medication Dose Route Frequency Provider Last Rate Last Dose  . acetaminophen (TYLENOL) tablet 650 mg  650 mg Oral Q6H PRN Clapacs, John T, MD      . alum & mag hydroxide-simeth (MAALOX/MYLANTA) 200-200-20 MG/5ML suspension 30  mL  30 mL Oral Q4H PRN Clapacs, John T, MD      . benztropine (COGENTIN) tablet 1 mg  1 mg Oral BID PRN McNew, Ileene Hutchinson, MD   1 mg at 04/08/18 2048   Or  . benztropine mesylate (COGENTIN) injection 1 mg  1 mg Intramuscular BID PRN McNew, Ileene Hutchinson, MD      . benztropine (COGENTIN) tablet 1 mg  1 mg Oral QHS McNew, Ileene Hutchinson, MD   1 mg at 04/11/18 2125  . divalproex (DEPAKOTE) DR tablet 1,000 mg  1,000 mg Oral BH-q7a McNew, Holly R, MD   1,000 mg at 04/12/18 0700   And  . divalproex (DEPAKOTE) DR tablet 750 mg  750 mg Oral QHS McNew, Holly R, MD   750 mg at 04/11/18 2125  . haloperidol (HALDOL) tablet 5 mg  5 mg Oral Q6H PRN McNew, Ileene Hutchinson, MD       Or  . haloperidol lactate (HALDOL) injection 5 mg  5 mg  Intramuscular Q6H PRN McNew, Ileene Hutchinson, MD   5 mg at 04/01/18 0819  . haloperidol (HALDOL) tablet 5 mg  5 mg Oral QHS McNew, Holly R, MD   5 mg at 04/11/18 2125  . hydrOXYzine (ATARAX/VISTARIL) tablet 50 mg  50 mg Oral TID PRN Clapacs, Jackquline Denmark, MD   50 mg at 04/04/18 2216  . LORazepam (ATIVAN) tablet 1 mg  1 mg Oral Q6H PRN Haskell Riling, MD   1 mg at 04/07/18 2157   Or  . LORazepam (ATIVAN) injection 1 mg  1 mg Intramuscular Q6H PRN Haskell Riling, MD   1 mg at 04/01/18 0820  . magnesium hydroxide (MILK OF MAGNESIA) suspension 30 mL  30 mL Oral Daily PRN Clapacs, John T, MD      . traZODone (DESYREL) tablet 100 mg  100 mg Oral QHS PRN Clapacs, Jackquline Denmark, MD   100 mg at 04/08/18 2050    Lab Results: No results found for this or any previous visit (from the past 48 hour(s)).  Blood Alcohol level:  Lab Results  Component Value Date   ETH <10 03/25/2018   ETH <10 12/06/2017    Metabolic Disorder Labs: Lab Results  Component Value Date   HGBA1C 5.4 03/25/2018   MPG 108.28 03/25/2018   No results found for: PROLACTIN Lab Results  Component Value Date   CHOL 184 03/25/2018   TRIG 43 03/25/2018   HDL 50 03/25/2018   CHOLHDL 3.7 03/25/2018   VLDL 9 03/25/2018   LDLCALC 125 (H) 03/25/2018    Physical Findings: AIMS: Facial and Oral Movements Muscles of Facial Expression: None, normal Lips and Perioral Area: None, normal Jaw: None, normal Tongue: None, normal,Extremity Movements Upper (arms, wrists, hands, fingers): None, normal Lower (legs, knees, ankles, toes): None, normal, Trunk Movements Neck, shoulders, hips: None, normal, Overall Severity Severity of abnormal movements (highest score from questions above): None, normal Incapacitation due to abnormal movements: None, normal Patient's awareness of abnormal movements (rate only patient's report): No Awareness, Dental Status Current problems with teeth and/or dentures?: No Does patient usually wear dentures?: No  CIWA:  CIWA-Ar  Total: 0 COWS:  COWS Total Score: 0  Musculoskeletal: Strength & Muscle Tone: within normal limits Gait & Station: normal Patient leans: N/A  Psychiatric Specialty Exam: Physical Exam  Nursing note and vitals reviewed.   Review of Systems  All other systems reviewed and are negative.   Blood pressure 113/61, pulse 81, temperature 98.1 F (  36.7 C), temperature source Oral, resp. rate 16, height 5\' 8"  (1.727 m), weight 58.1 kg, SpO2 100 %.Body mass index is 19.46 kg/m.  General Appearance: fairly groomed   Eye Contact:  Fair  Speech:  Clear and Coherent and Normal Rate  Volume:  Normal  Mood:  Anxious  Affect:  Blunt and Flat  Thought Process:  Goal Directed  Orientation:  Full (Time, Place, and Person)  Thought Content:  Logical  Suicidal Thoughts:  No  Homicidal Thoughts:  No  Memory:  Immediate;   Fair  Judgement:  Impaired  Insight:  Lacking  Psychomotor Activity:  Normal  Concentration:  Concentration: Fair  Recall:  [employer] of Knowledge:  Fair  Language:  Fair  Akathisia:  No,      Assets:  Resilience  ADL's:  Intact  Cognition:  WNL  Sleep:  Number of Hours: 6     Treatment Plan Summary: 37 yo male admitted due to agitation and aggression toward group home staff. Group home will now not allow him to return. Ayliana Casciano is still observed responding to internal stimuli at times but Tammie Ellsworth denies AH. Rehaan Viloria is otu of his room more and social with another peer. Peja Allender is able to ask appropriate questions through interview. Gladyce Mcray has not had any episodes of aggression on the unit since admission.   Plan:  Schizoaffective disorder -Continue Invega Sustenna q28 days. Repeat CBC showed improved ANC. This will need monitoring with his outpatient provider -Continue oral Haldol. There is an option of transitioning to Haldol Dec in the future if ANC decreases again -Continue Depakote 1000mg  qam and 750mg  qhs.  VPA is 123 on 8/22.  VPA trough is ordered again for tomorrow.    Neutropenia -CBC which is improved ANC of 1.4  Dispo -the primary team has been in contact with his guardian, and in the process of looking for a group home.    Elyn Krogh, MD 04/12/2018, 2:08 PM

## 2018-04-12 NOTE — Progress Notes (Signed)
Received David Davila this AM after breakfast. David Davila denied all of the psychiatric symptoms including suicidal. David Davila is OOB in the the milieu with his peers without conflict. No change in his status this PM.

## 2018-04-12 NOTE — [facility] Group Notes (Signed)
LCSW Group Therapy Note 04/12/2018 1:15pm  Type of Therapy and Topic: Group Therapy: Feelings Around Returning Home & Establishing a Supportive Framework and Supporting Oneself When Supports Not Available  Participation Level: Active  Description of Group:  Patients first processed thoughts and feelings about upcoming discharge. These included fears of upcoming changes, lack of change, new living environments, judgements and expectations from others and overall stigma of mental health issues. The group then discussed the definition of a supportive framework, what that looks and feels like, and how do to discern it from an unhealthy non-supportive network. The group identified different types of supports as well as what to do when your family/friends are less than helpful or unavailable  Therapeutic Goals  1. Patient will identify one healthy supportive network that they can use at discharge. 2. Patient will identify one factor of a supportive framework and how to tell it from an unhealthy network. 3. Patient able to identify one coping skill to use when they do not have positive supports from others. 4. Patient will demonstrate ability to communicate their needs through discussion and/or role plays.  Summary of Patient Progress:  The patient reported he feels "good." Pt engaged during group session. As patients processed their anxiety about discharge and described healthy supports patient shared he is ready to be discharge since he is taking the right medication.  Patients identified at least one self-care tool they were willing to use after discharge.   Therapeutic Modalities Cognitive Behavioral Therapy Motivational Interviewing   Forbes Loll  CUEBAS-COLON, LCSW 04/12/2018 2:35 PM

## 2018-04-13 NOTE — Progress Notes (Signed)
[facility] MD Progress Note  04/13/2018 3:01 PM [name]  MRN:  [id number] Subjective:  Pt has been calm and cooperative on the unit. He is out of his room more. He has brighter affect. He states that he liked his old group home but is willing to go to the new one. HE felt the interview on Friday went well and hopes he is accepted. He states that he is doing fine. He has no complaints today. He denies SI, HI, AH, VH. He is not observed responding to internal stimuli as much. He is much more organized in thoughts. He has not had any episodes of aggression. He denies any physical complaints.   Principal Problem: Undifferentiated schizophrenia (HCC) Diagnosis:   Patient Active Problem List   Diagnosis Date Noted  . Undifferentiated schizophrenia (HCC) [F20.3] 03/26/2018    Priority: High  . TBI (traumatic brain injury) (HCC) [S06.9X9A] 07/10/2016  . Vitamin B12 deficiency [E53.8] 07/10/2016  . Noncompliance with medication regimen [Z91.14] 06/25/2016  . Cocaine use disorder, mild, abuse (HCC) [F14.10] 06/25/2016  . Cannabis use disorder, mild, abuse [F12.10] 06/25/2016  . Paranoid schizophrenia (HCC) [F20.0] 06/24/2016   Total Time spent with patient: 20 minutes  Past Psychiatric History: See h&P  Past Medical History:  Past Medical History:  Diagnosis Date  . Acute psychosis (HCC)   . Antisocial personality disorder (HCC)   . Cannabis abuse    Past  . Paranoid schizophrenia (HCC)   . Schizoaffective disorder, bipolar type (HCC)    History reviewed. No pertinent surgical history. Family History: History reviewed. No pertinent family history. Family Psychiatric  History: See H&P Social History:  Social History   Substance and Sexual Activity  Alcohol Use No     Social History   Substance and Sexual Activity  Drug Use Not Currently    Social History   Socioeconomic History  . Marital status: Single    Spouse name: Not on file  . Number of children: Not on file   . Years of education: Not on file  . Highest education level: Not on file  Occupational History  . Not on file  Social Needs  . Financial resource strain: Not on file  . Food insecurity:    Worry: Not on file    Inability: Not on file  . Transportation needs:    Medical: Not on file    Non-medical: Not on file  Tobacco Use  . Smoking status: Former Smoker    Packs/day: 1.00    Types: Cigarettes  . Smokeless tobacco: Never Used  Substance and Sexual Activity  . Alcohol use: No  . Drug use: Not Currently  . Sexual activity: Not Currently  Lifestyle  . Physical activity:    Days per week: Not on file    Minutes per session: Not on file  . Stress: Not on file  Relationships  . Social connections:    Talks on phone: Not on file    Gets together: Not on file    Attends religious service: Not on file    Active member of club or organization: Not on file    Attends meetings of clubs or organizations: Not on file    Relationship status: Not on file  Other Topics Concern  . Not on file  Social History Narrative  . Not on file   Additional Social History:  Sleep: Good  Appetite:  Good  Current Medications: Current Facility-Administered Medications  Medication Dose Route Frequency Provider Last Rate Last Dose  . acetaminophen (TYLENOL) tablet 650 mg  650 mg Oral Q6H PRN Clapacs, John T, MD      . alum & mag hydroxide-simeth (MAALOX/MYLANTA) 200-200-20 MG/5ML suspension 30 mL  30 mL Oral Q4H PRN Clapacs, John T, MD      . benztropine (COGENTIN) tablet 1 mg  1 mg Oral BID PRN Oriana Horiuchi, Ileene Hutchinson, MD   1 mg at 04/08/18 2048   Or  . benztropine mesylate (COGENTIN) injection 1 mg  1 mg Intramuscular BID PRN Jerelle Virden, Ileene Hutchinson, MD      . benztropine (COGENTIN) tablet 1 mg  1 mg Oral QHS Saryn Cherry, Ileene Hutchinson, MD   1 mg at 04/12/18 2121  . divalproex (DEPAKOTE) DR tablet 1,000 mg  1,000 mg Oral BH-q7a Vallerie Hentz R, MD   1,000 mg at 04/13/18 0815   And  .  divalproex (DEPAKOTE) DR tablet 750 mg  750 mg Oral QHS Cobi Delph R, MD   750 mg at 04/12/18 2121  . haloperidol (HALDOL) tablet 5 mg  5 mg Oral Q6H PRN Oren Barella, Ileene Hutchinson, MD       Or  . haloperidol lactate (HALDOL) injection 5 mg  5 mg Intramuscular Q6H PRN Ruhan Borak, Ileene Hutchinson, MD   5 mg at 04/01/18 0819  . haloperidol (HALDOL) tablet 5 mg  5 mg Oral QHS Rhilyn Battle R, MD   5 mg at 04/12/18 2121  . hydrOXYzine (ATARAX/VISTARIL) tablet 50 mg  50 mg Oral TID PRN Clapacs, Jackquline Denmark, MD   50 mg at 04/04/18 2216  . LORazepam (ATIVAN) tablet 1 mg  1 mg Oral Q6H PRN Haskell Riling, MD   1 mg at 04/07/18 2157   Or  . LORazepam (ATIVAN) injection 1 mg  1 mg Intramuscular Q6H PRN Haskell Riling, MD   1 mg at 04/01/18 0820  . magnesium hydroxide (MILK OF MAGNESIA) suspension 30 mL  30 mL Oral Daily PRN Clapacs, John T, MD      . traZODone (DESYREL) tablet 100 mg  100 mg Oral QHS PRN Clapacs, Jackquline Denmark, MD   100 mg at 04/12/18 2121    Lab Results: No results found for this or any previous visit (from the past 48 hour(s)).  Blood Alcohol level:  Lab Results  Component Value Date   ETH <10 03/25/2018   ETH <10 12/06/2017    Metabolic Disorder Labs: Lab Results  Component Value Date   HGBA1C 5.4 03/25/2018   MPG 108.28 03/25/2018   No results found for: PROLACTIN Lab Results  Component Value Date   CHOL 184 03/25/2018   TRIG 43 03/25/2018   HDL 50 03/25/2018   CHOLHDL 3.7 03/25/2018   VLDL 9 03/25/2018   LDLCALC 125 (H) 03/25/2018    Physical Findings: AIMS: Facial and Oral Movements Muscles of Facial Expression: None, normal Lips and Perioral Area: None, normal Jaw: None, normal Tongue: None, normal,Extremity Movements Upper (arms, wrists, hands, fingers): None, normal Lower (legs, knees, ankles, toes): None, normal, Trunk Movements Neck, shoulders, hips: None, normal, Overall Severity Severity of abnormal movements (highest score from questions above): None, normal Incapacitation due to  abnormal movements: None, normal Patient's awareness of abnormal movements (rate only patient's report): No Awareness, Dental Status Current problems with teeth and/or dentures?: No Does patient usually wear dentures?: No  CIWA:  CIWA-Ar Total: 0 COWS:  COWS  Total Score: 0  Musculoskeletal: Strength & Muscle Tone: within normal limits Gait & Station: normal Patient leans: N/A  Psychiatric Specialty Exam: Physical Exam  Nursing note and vitals reviewed.   Review of Systems  All other systems reviewed and are negative.   Blood pressure (!) 99/56, pulse (!) 51, temperature 98.3 F (36.8 C), resp. rate 18, height 5\' 8"  (1.727 m), weight 58.1 kg, SpO2 96 %.Body mass index is 19.46 kg/m.  General Appearance: Casual  Eye Contact:  Good  Speech:  Clear and Coherent  Volume:  Normal  Mood:  Euthymic  Affect:  Constricted  Thought Process:  Coherent and Goal Directed  Orientation:  Full (Time, Place, and Person)  Thought Content:  Logical  Suicidal Thoughts:  No  Homicidal Thoughts:  No  Memory:  Immediate;   Fair  Judgement:  Impaired  Insight:  Lacking  Psychomotor Activity:  Normal  Concentration:  Concentration: Fair  Recall:  [employer] of Knowledge:  Fair  Language:  Fair  Akathisia:  No      Assets:  Resilience  ADL's:  Intact  Cognition:  WNL  Sleep:  Number of Hours: 7.75     Treatment Plan Summary: 37 yo male admitted due to agitation and violence at group home. He is doing much better. HE has not had any episodes of aggression since admission. He is much more organized in thoughts.   Plan:  Schizoaffective disorder -He received [place]. Continue q28 days. -Continue oral Haldol  Neutropenia -CBC shows improved ANC of 1.4  BP is lower today but vitals were taken while laying down. It has been normal otherwise  Dispo -He is pending placement to new group home. He has a guardian  Haskell Riling, MD 04/13/2018, 3:01 PM

## 2018-04-13 NOTE — Plan of Care (Signed)
Improved in cognition, improved in socialization with peers, out of room and visible in the day room, smiling mood and affect, no temper tantrums, not saying much but eating and drinking, complied with medication at bedtime. Denied SI/HI/AVH.  Precautionary checks every 15 minutes for safety maintained, room free of safety hazards, patient sustains no injury or falls during this shift.  Problem: Education: Goal: Emotional status will improve Outcome: Progressing Goal: Mental status will improve Outcome: Progressing   Problem: Activity: Goal: Sleeping patterns will improve Outcome: Progressing   Problem: Coping: Goal: Ability to verbalize frustrations and anger appropriately will improve Outcome: Progressing   Problem: Health Behavior/Discharge Planning: Goal: Compliance with treatment plan for underlying cause of condition will improve Outcome: Progressing   Problem: Safety: Goal: Periods of time without injury will increase Outcome: Progressing   Problem: Education: Goal: Will be free of psychotic symptoms Outcome: Progressing Goal: Knowledge of the prescribed therapeutic regimen will improve Outcome: Progressing   Problem: Coping: Goal: Will verbalize feelings Outcome: Progressing   Problem: Health Behavior/Discharge Planning: Goal: Compliance with prescribed medication regimen will improve Outcome: Progressing   Problem: Role Relationship: Goal: Ability to interact with others will improve Outcome: Progressing

## 2018-04-13 NOTE — Plan of Care (Addendum)
Patient found in day room upon my arrival. Patient is visible and social this evening. Patient mood and affect is improved. Speech and interaction with writer is minimal but cooperative. Denies SI/HI/AVH. Affect is bright. Mood is "good." Reports eating and voiding adequately. Compliant with HS medications and staff direction. Given Trazodone for sleep with positive results. Q 15 minute checks maintained. Will continue to monitor throughout the shift. Patient slept 7.75 hours. No apparent distress. Patient would not come to day room for VS. Taken at bedside. BP low on one arm but WNL on other. Patient refused Depakote at this time. Moved time to where other medications are due for another attempt. Patient is grumpy this am. Refused lab draw. Will endorse care to oncoming shift.  Problem: Education: Goal: Emotional status will improve Outcome: Progressing Goal: Mental status will improve Outcome: Progressing   Problem: Activity: Goal: Interest or engagement in activities will improve Outcome: Progressing Goal: Sleeping patterns will improve Outcome: Progressing   Problem: Coping: Goal: Ability to verbalize frustrations and anger appropriately will improve Outcome: Progressing Goal: Ability to demonstrate self-control will improve Outcome: Progressing   Problem: Education: Goal: Will be free of psychotic symptoms Outcome: Progressing Goal: Knowledge of the prescribed therapeutic regimen will improve Outcome: Progressing

## 2018-04-13 NOTE — Plan of Care (Signed)
Patient alert and oriented to self, place and situation today with less irritability.  Took his Depakote this morning and ambulated the unit with steady gait. Patient not  observed responding to internal stimuli this shift. Patient also observed interacting with select peers. Patient out of the room more today in comparison to previous dates. Denies wanting to hurt himself or anyone else. Denies depression and anxiety. Milieu remains safe with Q 15 minute safety checks.

## 2018-04-14 NOTE — [facility] Counselor (Signed)
CSW called Mauri Reading at 8:16aam to see if he has made a final decision with placing the patient after his interview with Alcario Drought on Friday. CSW left a voicemail with a callback number.  CSW called Mauri Reading at 2:20pm to check in with him again. No answer. CSW will continue to reach out to Mr. Humphries.   Johny Shears, MSW, Theresia Majors, Bridget Hartshorn Clinical Social Worker 04/14/2018 2:21 PM

## 2018-04-14 NOTE — Progress Notes (Signed)
Recreation Therapy Notes  Date: 04/14/2018  Time: 9:30 am   Location: Outside   Behavioral response: N/A   Intervention Topic: Problem Solving  Discussion/Intervention: Patient did not attend group.   Clinical Observations/Feedback:  Patient did not attend group.   Daymion Nazaire LRT/CTRS        Alis Sawchuk 04/14/2018 11:06 AM

## 2018-04-14 NOTE — Progress Notes (Signed)
[facility] MD Progress Note  04/14/2018 1:56 PM David Davila.  MRN:  [id number] Subjective:  Pt is out of his room more today. He is attending group. HE states that he feels alright. He asked why we are checking his blood work so much. Discussed that we need to check a Depakote level. He agreed to have this done. He is calm and appropriate. HE makes better eye contact. He states that his medications are helping him. HE denies any side effects including muscle stiffness. He denies SI, HI, AH, VH. He reports sleeping well.   Principal Problem: Undifferentiated schizophrenia (HCC) Diagnosis:   Patient Active Problem List   Diagnosis Date Noted  . Undifferentiated schizophrenia (HCC) [F20.3] 03/26/2018    Priority: High  . TBI (traumatic brain injury) (HCC) [S06.9X9A] 07/10/2016  . Vitamin B12 deficiency [E53.8] 07/10/2016  . Noncompliance with medication regimen [Z91.14] 06/25/2016  . Cocaine use disorder, mild, abuse (HCC) [F14.10] 06/25/2016  . Cannabis use disorder, mild, abuse [F12.10] 06/25/2016  . Paranoid schizophrenia (HCC) [F20.0] 06/24/2016   Total Time spent with patient: 15 minutes  Past Psychiatric History: See H&p  Past Medical History:  Past Medical History:  Diagnosis Date  . Acute psychosis (HCC)   . Antisocial personality disorder (HCC)   . Cannabis abuse    Past  . Paranoid schizophrenia (HCC)   . Schizoaffective disorder, bipolar type (HCC)    History reviewed. No pertinent surgical history. Family History: History reviewed. No pertinent family history. Family Psychiatric  History: See H&P Social History:  Social History   Substance and Sexual Activity  Alcohol Use No     Social History   Substance and Sexual Activity  Drug Use Not Currently    Social History   Socioeconomic History  . Marital status: Single    Spouse name: Not on file  . Number of children: Not on file  . Years of education: Not on file  . Highest education level: Not on file   Occupational History  . Not on file  Social Needs  . Financial resource strain: Not on file  . Food insecurity:    Worry: Not on file    Inability: Not on file  . Transportation needs:    Medical: Not on file    Non-medical: Not on file  Tobacco Use  . Smoking status: Former Smoker    Packs/day: 1.00    Types: Cigarettes  . Smokeless tobacco: Never Used  Substance and Sexual Activity  . Alcohol use: No  . Drug use: Not Currently  . Sexual activity: Not Currently  Lifestyle  . Physical activity:    Days per week: Not on file    Minutes per session: Not on file  . Stress: Not on file  Relationships  . Social connections:    Talks on phone: Not on file    Gets together: Not on file    Attends religious service: Not on file    Active member of club or organization: Not on file    Attends meetings of clubs or organizations: Not on file    Relationship status: Not on file  Other Topics Concern  . Not on file  Social History Narrative  . Not on file   Additional Social History:                         Sleep: Good  Appetite:  Good  Current Medications: Current Facility-Administered Medications  Medication Dose Route  Frequency Provider Last Rate Last Dose  . acetaminophen (TYLENOL) tablet 650 mg  650 mg Oral Q6H PRN Clapacs, John T, MD      . alum & mag hydroxide-simeth (MAALOX/MYLANTA) 200-200-20 MG/5ML suspension 30 mL  30 mL Oral Q4H PRN Clapacs, John T, MD      . benztropine (COGENTIN) tablet 1 mg  1 mg Oral BID PRN Keola Heninger, Ileene Hutchinson, MD   1 mg at 04/08/18 2048   Or  . benztropine mesylate (COGENTIN) injection 1 mg  1 mg Intramuscular BID PRN Huck Ashworth, Ileene Hutchinson, MD      . benztropine (COGENTIN) tablet 1 mg  1 mg Oral QHS Karon Heckendorn, Ileene Hutchinson, MD   1 mg at 04/13/18 2113  . divalproex (DEPAKOTE) DR tablet 1,000 mg  1,000 mg Oral BH-q7a Paitynn Mikus, Ileene Hutchinson, MD   1,000 mg at 04/14/18 0746   And  . divalproex (DEPAKOTE) DR tablet 750 mg  750 mg Oral QHS Lynia Landry R, MD   750  mg at 04/13/18 2113  . haloperidol (HALDOL) tablet 5 mg  5 mg Oral Q6H PRN Lafonda Patron, Ileene Hutchinson, MD       Or  . haloperidol lactate (HALDOL) injection 5 mg  5 mg Intramuscular Q6H PRN Kaedon Fanelli, Ileene Hutchinson, MD   5 mg at 04/01/18 0819  . haloperidol (HALDOL) tablet 5 mg  5 mg Oral QHS Barnett Elzey R, MD   5 mg at 04/13/18 2113  . hydrOXYzine (ATARAX/VISTARIL) tablet 50 mg  50 mg Oral TID PRN Clapacs, Jackquline Denmark, MD   50 mg at 04/04/18 2216  . LORazepam (ATIVAN) tablet 1 mg  1 mg Oral Q6H PRN Haskell Riling, MD   1 mg at 04/07/18 2157   Or  . LORazepam (ATIVAN) injection 1 mg  1 mg Intramuscular Q6H PRN Haskell Riling, MD   1 mg at 04/01/18 0820  . magnesium hydroxide (MILK OF MAGNESIA) suspension 30 mL  30 mL Oral Daily PRN Clapacs, John T, MD      . traZODone (DESYREL) tablet 100 mg  100 mg Oral QHS PRN Clapacs, Jackquline Denmark, MD   100 mg at 04/12/18 2121    Lab Results: No results found for this or any previous visit (from the past 48 hour(s)).  Blood Alcohol level:  Lab Results  Component Value Date   ETH <10 03/25/2018   ETH <10 12/06/2017    Metabolic Disorder Labs: Lab Results  Component Value Date   HGBA1C 5.4 03/25/2018   MPG 108.28 03/25/2018   No results found for: PROLACTIN Lab Results  Component Value Date   CHOL 184 03/25/2018   TRIG 43 03/25/2018   HDL 50 03/25/2018   CHOLHDL 3.7 03/25/2018   VLDL 9 03/25/2018   LDLCALC 125 (H) 03/25/2018    Physical Findings: AIMS: Facial and Oral Movements Muscles of Facial Expression: None, normal Lips and Perioral Area: None, normal Jaw: None, normal Tongue: None, normal,Extremity Movements Upper (arms, wrists, hands, fingers): None, normal Lower (legs, knees, ankles, toes): None, normal, Trunk Movements Neck, shoulders, hips: None, normal, Overall Severity Severity of abnormal movements (highest score from questions above): None, normal Incapacitation due to abnormal movements: None, normal Patient's awareness of abnormal movements (rate  only patient's report): No Awareness, Dental Status Current problems with teeth and/or dentures?: No Does patient usually wear dentures?: No  CIWA:  CIWA-Ar Total: 0 COWS:  COWS Total Score: 0  Musculoskeletal: Strength & Muscle Tone: within normal limits Gait & Station: normal  Patient leans: N/A  Psychiatric Specialty Exam: Physical Exam  Nursing note and vitals reviewed.   Review of Systems  All other systems reviewed and are negative.   Blood pressure (!) 99/56, pulse (!) 51, temperature 98.3 F (36.8 C), resp. rate 18, height 5\' 8"  (1.727 m), weight 58.1 kg, SpO2 96 %.Body mass index is 19.46 kg/m.  General Appearance: Casual  Eye Contact:  Fair  Speech:  Clear and Coherent  Volume:  Normal  Mood:  Euthymic  Affect:  Constricted  Thought Process:  Coherent and Goal Directed  Orientation:  Full (Time, Place, and Person)  Thought Content:  Logical  Suicidal Thoughts:  No  Homicidal Thoughts:  No  Memory:  Recent;   Fair  Judgement:  Impaired  Insight:  Lacking  Psychomotor Activity:  Normal  Concentration:  Concentration: Fair  Recall:  [employer] of Knowledge:  Fair  Language:  Fair  Akathisia:  No      Assets:  Resilience  ADL's:  Intact  Cognition:  WNL  Sleep:  Number of Hours: 7.25     Treatment Plan Summary: 37 yo male admitted due to psychosis and aggression. He has been very calm and appropriate on the unit. He is waiting placement to new group home. HE has not been agitated since admission.   Plan:  Schizoaffective disorder -Continue Hinda Glatter Sustenna 234 mg q28 days -Continue Depakote 1000 mg and 750 mg qhs, Depakote level not drawn yet  Vitals not done today  Dispo -Pending placement to new group home    Haskell Riling, MD 04/14/2018, 1:56 PM

## 2018-04-14 NOTE — [facility] Group Notes (Signed)
04/14/2018 1PM  Type of Therapy/Topic:  Group Therapy:  Feelings about Diagnosis  Participation Level:  Minimal   Description of Group:   This group will allow patients to explore their thoughts and feelings about diagnoses they have received. Patients will be guided to explore their level of understanding and acceptance of these diagnoses. Facilitator will encourage patients to process their thoughts and feelings about the reactions of others to their diagnosis and will guide patients in identifying ways to discuss their diagnosis with significant others in their lives. This group will be process-oriented, with patients participating in exploration of their own experiences, giving and receiving support, and processing challenge from other group members.   Therapeutic Goals: 1. Patient will demonstrate understanding of diagnosis as evidenced by identifying two or more symptoms of the disorder 2. Patient will be able to express two feelings regarding the diagnosis 3. Patient will demonstrate their ability to communicate their needs through discussion and/or role play  Summary of Patient Progress: Actively and appropriately engaged in the group. Patient practiced active listening when interacting with the facilitator and other group members. David Davila mentioned having a symptom of "talking to myself". He did not go into further detail but was able to listen in on the discussion regarding his symptom. Patient is still in the process of obtaining treatment goals.        Therapeutic Modalities:   Cognitive Behavioral Therapy Brief Therapy Feelings Identification    Johny Shears, LCSW 04/14/2018 2:00 PM

## 2018-04-14 NOTE — [facility] Group Notes (Signed)
[facility] Group Notes:  (Nursing/MHT/Case Management/Adjunct)  Date:  04/14/2018  Time:  9:36 PM  Type of Therapy:  Group Therapy  Participation Level:     Participation Quality:  Appropriate  Affect:  Appropriate  Cognitive:  Appropriate  Insight:  Appropriate and Good  Engagement in Group:  Engaged  Modes of Intervention:  Discussion  Summary of Progress/Problems:  Chauncey Fischer 04/14/2018, 9:36 PM

## 2018-04-14 NOTE — Progress Notes (Signed)
Assumed care of this patient @0500 . Slept 7.25 hours. No apparent distress. Will endorse care to oncoming shift.

## 2018-04-14 NOTE — [place] FL2 (Signed)
[place] MEDICAID FL2 LEVEL OF CARE SCREENING TOOL     IDENTIFICATION  Patient Name: David Davila. Birthdate: 1980/11/09 Sex: male Admission Date (Current Location): 03/26/2018  [place] and [place] Number:  Teacher, English as a foreign language Facility and Address:  [facility], [address], [place], [place] [address]      Provider Number: [id number]  Attending Physician Name and Address:  Haskell Riling, MD  Relative Name and Phone Number:  Concepcion Elk is legal guardian [phone] [employer] [place]     Current Level of Care: Hospital Recommended Level of Care: Other (Comment), Family Care Home(Group Home) Prior Approval Number:    Date Approved/Denied:   PASRR Number:    Discharge Plan: Domiciliary (Rest home)    Current Diagnoses: Patient Active Problem List   Diagnosis Date Noted  . 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 TBI (traumatic brain injury) (HCC) 07/10/2016  . Vitamin B12 deficiency 07/10/2016  . Noncompliance with medication regimen 06/25/2016  . Cocaine use disorder, mild, abuse (HCC) 06/25/2016  . Cannabis use disorder, mild, abuse 06/25/2016  . Paranoid schizophrenia (HCC) 06/24/2016    Orientation RESPIRATION BLADDER Height & Weight     Self, Time, Situation, Place  Normal Continent Weight: 128 lb (58.1 kg) Height:  5\' 8"  (172.7 cm)  BEHAVIORAL SYMPTOMS/MOOD NEUROLOGICAL BOWEL NUTRITION STATUS  Other (Comment)(Severe agitation, aggression and disorganization when off of his medications) (None) Continent Diet  AMBULATORY STATUS COMMUNICATION OF NEEDS Skin   Independent Verbally Normal                       Personal Care Assistance Level of Assistance  Bathing, Feeding, Dressing, Total care Bathing Assistance: Independent Feeding assistance: Independent Dressing Assistance: Independent Total Care Assistance: Independent   Functional Limitations Info  Sight, Hearing, Speech Sight Info: Adequate Hearing  Info: Adequate Speech Info: Adequate    SPECIAL CARE FACTORS FREQUENCY  (N/A)                    Contractures Contractures Info: Not present    Additional Factors Info  Code Status, Psychotropic Code Status Info: Full Code   Psychotropic Info: Psycotropic Medications         Current Medications (04/14/2018):  This is the current hospital active medication list Current Facility-Administered Medications  Medication Dose Route Frequency Provider Last Rate Last Dose  . acetaminophen (TYLENOL) tablet 650 mg  650 mg Oral Q6H PRN Clapacs, John T, MD      . alum & mag hydroxide-simeth (MAALOX/MYLANTA) 200-200-20 MG/5ML suspension 30 mL  30 mL Oral Q4H PRN Clapacs, John T, MD      . benztropine (COGENTIN) tablet 1 mg  1 mg Oral BID PRN McNew, Ileene Hutchinson, MD   1 mg at 04/08/18 2048   Or  . benztropine mesylate (COGENTIN) injection 1 mg  1 mg Intramuscular BID PRN McNew, Ileene Hutchinson, MD      . benztropine (COGENTIN) tablet 1 mg  1 mg Oral QHS McNew, Ileene Hutchinson, MD   1 mg at 04/13/18 2113  . divalproex (DEPAKOTE) DR tablet 1,000 mg  1,000 mg Oral BH-q7a McNew, Ileene Hutchinson, MD   1,000 mg at 04/14/18 0746   And  . divalproex (DEPAKOTE) DR tablet 750 mg  750 mg Oral QHS McNew, Ileene Hutchinson, MD   750 mg at 04/13/18 2113  . haloperidol (HALDOL) tablet 5 mg  5 mg Oral Q6H PRN McNew, Ileene Hutchinson, MD  Or  . haloperidol lactate (HALDOL) injection 5 mg  5 mg Intramuscular Q6H PRN McNew, Ileene Hutchinson, MD   5 mg at 04/01/18 0819  . haloperidol (HALDOL) tablet 5 mg  5 mg Oral QHS McNew, Ileene Hutchinson, MD   5 mg at 04/13/18 2113  . hydrOXYzine (ATARAX/VISTARIL) tablet 50 mg  50 mg Oral TID PRN Clapacs, Jackquline Denmark, MD   50 mg at 04/04/18 2216  . LORazepam (ATIVAN) tablet 1 mg  1 mg Oral Q6H PRN Haskell Riling, MD   1 mg at 04/07/18 2157   Or  . LORazepam (ATIVAN) injection 1 mg  1 mg Intramuscular Q6H PRN Haskell Riling, MD   1 mg at 04/01/18 0820  . magnesium hydroxide (MILK OF MAGNESIA) suspension 30 mL  30 mL Oral Daily PRN  Clapacs, John T, MD      . traZODone (DESYREL) tablet 100 mg  100 mg Oral QHS PRN Clapacs, Jackquline Denmark, MD   100 mg at 04/12/18 2121     Discharge Medications: Please see discharge summary for a list of discharge medications.  Relevant Imaging Results:  Relevant Lab Results:   Additional Information TBI (traumatic brain injury)  Johny Shears, LCSW

## 2018-04-14 NOTE — [facility] Group Notes (Signed)
[facility] Group Notes:  (Nursing/MHT/Case Management/Adjunct)  Date:  04/14/2018  Time:  1:03 AM  Type of Therapy:  Group Therapy  Participation Level:  Active  Participation Quality:  Appropriate  Affect:  Appropriate  Cognitive:  Appropriate  Insight:  Appropriate  Engagement in Group:  Engaged  Modes of Intervention:  Discussion  Summary of Progress/Problems:  David Davila 04/14/2018, 1:03 AM

## 2018-04-14 NOTE — [facility] Counselor (Signed)
CSW called [facility] to see if he had a male bed available for the patient. CSW was unable to leave a voicemail.  CSW called [facility] to see if she had a male bed available. She reports that she does and wants the CSW to send over a FL2 [phone]). She says that she will look over it and will possibly come see the patient tomorrow.  CSW sent over FL2.   Johny Shears, MSW, Theresia Majors, Bridget Hartshorn Clinical Social Worker 04/14/2018 2:58 PM

## 2018-04-14 NOTE — Plan of Care (Signed)
Patient alert and oriented to self, place and situation today with less irritability.  Took his Depakote this morning and ambulated the unit with steady gait. Patient not  observed responding to internal stimuli this shift. Patient observed interacting with select peers. Patient out of the room more today ambulating the unit. States, "I just ready to, nothing to do here." Denies wanting to hurt himself or anyone else. Denies depression and anxiety. Milieu remains safe with Q 15 minute safety checks.

## 2018-04-15 NOTE — Plan of Care (Signed)
Patient is alert and oriented X 4. Patient denies SI, HI and AVH. Patient is very cautious and guarded, but cooperative with medications and staff. Patient is isolative, paces the unit, RN did not see any internal stimuli responding. Patient rates pain 0/10. Nurse will continue to monitor. Problem: Education: Goal: Knowledge of  General Education information/materials will improve Outcome: Progressing Goal: Emotional status will improve Outcome: Progressing Goal: Mental status will improve Outcome: Progressing Goal: Verbalization of understanding the information provided will improve Outcome: Progressing   Problem: Activity: Goal: Interest or engagement in activities will improve Outcome: Progressing Goal: Sleeping patterns will improve Outcome: Progressing   Problem: Coping: Goal: Ability to verbalize frustrations and anger appropriately will improve Outcome: Progressing Goal: Ability to demonstrate self-control will improve Outcome: Progressing

## 2018-04-15 NOTE — [facility] Counselor (Signed)
CSW spoke with David Davila regarding the patient. He reports that the patient is not a good fit for the opening that he has at his current home. CSW will continue to search for placement for the patient.  Johny Shears, MSW, Theresia Majors, Bridget Hartshorn Clinical Social Worker 04/15/2018 11:36 AM

## 2018-04-15 NOTE — [facility] Counselor (Signed)
CSW called [facility] to see if he has made a final decision with placing the patient after his interview with Alcario Drought on Friday. left a voicemail with a callback number.  CSW called Birder Robson to see if she will come and interview with the patient today for a placement. left a voicemail with a callback number.  Johny Shears, MSW, Theresia Majors, Bridget Hartshorn Clinical Social Worker 04/15/2018 10:26 AM

## 2018-04-15 NOTE — [facility] Group Notes (Signed)
LCSW Group Therapy Note  04/15/2018 1:00 pm  Type of Therapy/Topic:  Group Therapy:  Emotion Regulation  Participation Level:  Did Not Attend   Description of Group:    The purpose of this group is to assist patients in learning to regulate negative emotions and experience positive emotions. Patients will be guided to discuss ways in which they have been vulnerable to their negative emotions. These vulnerabilities will be juxtaposed with experiences of positive emotions or situations, and patients will be challenged to use positive emotions to combat negative ones. Special emphasis will be placed on coping with negative emotions in conflict situations, and patients will process healthy conflict resolution skills.  Therapeutic Goals: 1. Patient will identify two positive emotions or experiences to reflect on in order to balance out negative emotions 2. Patient will label two or more emotions that they find the most difficult to experience 3. Patient will demonstrate positive conflict resolution skills through discussion and/or role plays  Summary of Patient Progress:   Michaelanthony was invited to today's group, but chose not to attend.    Therapeutic Modalities:   Cognitive Behavioral Therapy Feelings Identification Dialectical Behavioral Therapy

## 2018-04-15 NOTE — Plan of Care (Addendum)
Patient found awake in bed upon my arrival. Patient is visible but not social this evening. Patient is somewhat isolative on remains on the periphery of milieu this evening. Minimally verbal during assessment. Mood is "fine" and affect is blunted/flat. Denies SI/HI/AVH. Denies pain. Reports eating and voiding adequately. Compliant with HS medications and staff direction. Q 15 minute checks maintained. Will continue to monitor throughout the shift. Patient slept 7 hours. No apparent distress. Will endorse care to oncoming shift.   Problem: Education: Goal: Emotional status will improve Outcome: Progressing Goal: Mental status will improve Outcome: Progressing   Problem: Activity: Goal: Sleeping patterns will improve Outcome: Progressing   Problem: Coping: Goal: Ability to verbalize frustrations and anger appropriately will improve Outcome: Progressing Goal: Ability to demonstrate self-control will improve Outcome: Progressing   Problem: Safety: Goal: Periods of time without injury will increase Outcome: Progressing   Problem: Education: Goal: Knowledge of the prescribed therapeutic regimen will improve Outcome: Progressing

## 2018-04-15 NOTE — Progress Notes (Signed)
Patient ID: David Varin., male   DOB: 07-18-1981, 37 y.o.   MRN: [id number] PER STATE REGULATIONS 482.30  THIS CHART WAS REVIEWED FOR MEDICAL NECESSITY WITH RESPECT TO THE PATIENT'S ADMISSION/ DURATION OF STAY.  NEXT REVIEW DATE: 04/19/2018 [name], RN, BSN CASE MANAGER

## 2018-04-15 NOTE — Progress Notes (Signed)
[facility] MD Progress Note  04/15/2018 3:03 PM David Davila.  MRN:  [id number] Subjective:  Pt has mostly been keeping to himself. He denies any complaints today. HE states that his mood is "alright." He denies SI, HI, AH, VH. He denies feeling irritable or aggressive. Discussed that he was declined from one of the group homes. HE accepted this information well. He is organized and goal directed.   Principal Problem: Undifferentiated schizophrenia (HCC) Diagnosis:   Patient Active Problem List   Diagnosis Date Noted  . Undifferentiated schizophrenia (HCC) [F20.3] 03/26/2018    Priority: High  . TBI (traumatic brain injury) (HCC) [S06.9X9A] 07/10/2016  . Vitamin B12 deficiency [E53.8] 07/10/2016  . Noncompliance with medication regimen [Z91.14] 06/25/2016  . Cocaine use disorder, mild, abuse (HCC) [F14.10] 06/25/2016  . Cannabis use disorder, mild, abuse [F12.10] 06/25/2016  . 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 Acute psychosis (HCC)   . Antisocial personality disorder (HCC)   . Cannabis abuse    Past  . Paranoid schizophrenia (HCC)   . Schizoaffective disorder, bipolar type (HCC)    History reviewed. No pertinent surgical history. Family History: History reviewed. No pertinent family history. Family Psychiatric  History: See h&P Social History:  Social History   Substance and Sexual Activity  Alcohol Use No     Social History   Substance and Sexual Activity  Drug Use Not Currently    Social History   Socioeconomic History  . Marital status: Single    Spouse name: Not on file  . Number of children: Not on file  . Years of education: Not on file  . Highest education level: Not on file  Occupational History  . Not on file  Social Needs  . Financial resource strain: Not on file  . Food insecurity:   Worry: Not on file    Inability: Not on file  . Transportation needs:    Medical: Not on file    Non-medical: Not on file  Tobacco Use  . Smoking status: Former Smoker    Packs/day: 1.00    Types: Cigarettes  . Smokeless tobacco: Never Used  Substance and Sexual Activity  . Alcohol use: No  . Drug use: Not Currently  . Sexual activity: Not Currently  Lifestyle  . Physical activity:    Days per week: Not on file    Minutes per session: Not on file  . Stress: Not on file  Relationships  . Social connections:    Talks on phone: Not on file    Gets together: Not on file    Attends religious service: Not on file    Active member of club or organization: Not on file    Attends meetings of clubs or organizations: Not on file    Relationship status: Not on file  Other Topics Concern  . Not on file  Social History Narrative  . Not on file   Additional Social History:                         Sleep: Good  Appetite:  Good  Current Medications: Current Facility-Administered Medications  Medication Dose Route Frequency Provider Last Rate Last Dose  . acetaminophen (TYLENOL) tablet 650 mg  650 mg Oral Q6H PRN Clapacs, Jackquline Denmark, MD      .  alum & mag hydroxide-simeth (MAALOX/MYLANTA) 200-200-20 MG/5ML suspension 30 mL  30 mL Oral Q4H PRN Clapacs, John T, MD      . benztropine (COGENTIN) tablet 1 mg  1 mg Oral BID PRN Kesha Hurrell, Ileene Hutchinson, MD   1 mg at 04/08/18 2048   Or  . benztropine mesylate (COGENTIN) injection 1 mg  1 mg Intramuscular BID PRN Lynleigh Kovack, Ileene Hutchinson, MD      . benztropine (COGENTIN) tablet 1 mg  1 mg Oral QHS Shiara Mcgough, Ileene Hutchinson, MD   1 mg at 04/14/18 2122  . divalproex (DEPAKOTE) DR tablet 1,000 mg  1,000 mg Oral BH-q7a Leza Apsey, Ileene Hutchinson, MD   1,000 mg at 04/15/18 4098   And  . divalproex (DEPAKOTE) DR tablet 750 mg  750 mg Oral QHS Marlyn Tondreau R, MD   750 mg at 04/14/18 2122  . haloperidol (HALDOL) tablet 5 mg  5 mg Oral Q6H PRN Shanitha Twining, Ileene Hutchinson, MD       Or  . haloperidol  lactate (HALDOL) injection 5 mg  5 mg Intramuscular Q6H PRN Jorden Mahl, Ileene Hutchinson, MD   5 mg at 04/01/18 0819  . haloperidol (HALDOL) tablet 5 mg  5 mg Oral QHS Vonda Harth R, MD   5 mg at 04/14/18 2122  . hydrOXYzine (ATARAX/VISTARIL) tablet 50 mg  50 mg Oral TID PRN Clapacs, Jackquline Denmark, MD   50 mg at 04/04/18 2216  . LORazepam (ATIVAN) tablet 1 mg  1 mg Oral Q6H PRN Haskell Riling, MD   1 mg at 04/07/18 2157   Or  . LORazepam (ATIVAN) injection 1 mg  1 mg Intramuscular Q6H PRN Haskell Riling, MD   1 mg at 04/01/18 0820  . magnesium hydroxide (MILK OF MAGNESIA) suspension 30 mL  30 mL Oral Daily PRN Clapacs, John T, MD      . traZODone (DESYREL) tablet 100 mg  100 mg Oral QHS PRN Clapacs, Jackquline Denmark, MD   100 mg at 04/14/18 2122    Lab Results: No results found for this or any previous visit (from the past 48 hour(s)).  Blood Alcohol level:  Lab Results  Component Value Date   ETH <10 03/25/2018   ETH <10 12/06/2017    Metabolic Disorder Labs: Lab Results  Component Value Date   HGBA1C 5.4 03/25/2018   MPG 108.28 03/25/2018   No results found for: PROLACTIN Lab Results  Component Value Date   CHOL 184 03/25/2018   TRIG 43 03/25/2018   HDL 50 03/25/2018   CHOLHDL 3.7 03/25/2018   VLDL 9 03/25/2018   LDLCALC 125 (H) 03/25/2018    Physical Findings: AIMS: Facial and Oral Movements Muscles of Facial Expression: None, normal Lips and Perioral Area: None, normal Jaw: None, normal Tongue: None, normal,Extremity Movements Upper (arms, wrists, hands, fingers): None, normal Lower (legs, knees, ankles, toes): None, normal, Trunk Movements Neck, shoulders, hips: None, normal, Overall Severity Severity of abnormal movements (highest score from questions above): None, normal Incapacitation due to abnormal movements: None, normal Patient's awareness of abnormal movements (rate only patient's report): No Awareness, Dental Status Current problems with teeth and/or dentures?: No Does patient  usually wear dentures?: No  CIWA:  CIWA-Ar Total: 0 COWS:  COWS Total Score: 0  Musculoskeletal: Strength & Muscle Tone: within normal limits Gait & Station: normal Patient leans: N/A  Psychiatric Specialty Exam: Physical Exam  Nursing note and vitals reviewed.   Review of Systems  All other systems reviewed and are negative.  Blood pressure 105/70, pulse 90, temperature 97.9 F (36.6 C), temperature source Oral, resp. rate 16, height 5\' 8"  (1.727 m), weight 58.1 kg, SpO2 98 %.Body mass index is 19.46 kg/m.  General Appearance: Casual  Eye Contact:  Minimal  Speech:  Clear and Coherent  Volume:  Normal  Mood:  Euthymic  Affect:  Constricted  Thought Process:  Coherent  Orientation:  Full (Time, Place, and Person)  Thought Content:  Logical  Suicidal Thoughts:  No  Homicidal Thoughts:  No  Memory:  Immediate;   Fair  Judgement:  Impaired  Insight:  Lacking  Psychomotor Activity:  Normal  Concentration:  Concentration: Fair  Recall:  [employer] of Knowledge:  Fair  Language:  Fair  Akathisia:  No      Assets:  Resilience  ADL's:  Intact  Cognition:  WNL  Sleep:  Number of Hours: 7     Treatment Plan Summary: 37 yo male admitted due to agitation with group home staff. He has been very calm on the unit. He mostly keeps to himself. He is waiting placement at group home. HE was declined from one of them today.   Plan:  Schizoaffective disorder  Next INvega Injection 234 mg due on 04/30/18 -Continue Depakote 1000 and 750 mg qhs  Dispo -CSW looking into placement into group home  Haskell Riling, MD 04/15/2018, 3:03 PM

## 2018-04-15 NOTE — Tx Team (Signed)
Interdisciplinary Treatment and Diagnostic Plan Update  04/15/2018 Time of Session: 11:30am David Davila. MRN: [id number]  Principal Diagnosis: Undifferentiated schizophrenia (HCC)  Secondary Diagnoses: Principal Problem:   Undifferentiated schizophrenia (HCC)   Current Medications:  Current Facility-Administered Medications  Medication Dose Route Frequency Provider Last Rate Last Dose  . acetaminophen (TYLENOL) tablet 650 mg  650 mg Oral Q6H PRN Clapacs, John T, MD      . alum & mag hydroxide-simeth (MAALOX/MYLANTA) 200-200-20 MG/5ML suspension 30 mL  30 mL Oral Q4H PRN Clapacs, John T, MD      . benztropine (COGENTIN) tablet 1 mg  1 mg Oral BID PRN McNew, Ileene Hutchinson, MD   1 mg at 04/08/18 2048   Or  . benztropine mesylate (COGENTIN) injection 1 mg  1 mg Intramuscular BID PRN McNew, Ileene Hutchinson, MD      . benztropine (COGENTIN) tablet 1 mg  1 mg Oral QHS McNew, Ileene Hutchinson, MD   1 mg at 04/14/18 2122  . divalproex (DEPAKOTE) DR tablet 1,000 mg  1,000 mg Oral BH-q7a McNew, Ileene Hutchinson, MD   1,000 mg at 04/14/18 0746   And  . divalproex (DEPAKOTE) DR tablet 750 mg  750 mg Oral QHS McNew, Holly R, MD   750 mg at 04/14/18 2122  . haloperidol (HALDOL) tablet 5 mg  5 mg Oral Q6H PRN McNew, Ileene Hutchinson, MD       Or  . haloperidol lactate (HALDOL) injection 5 mg  5 mg Intramuscular Q6H PRN McNew, Ileene Hutchinson, MD   5 mg at 04/01/18 0819  . haloperidol (HALDOL) tablet 5 mg  5 mg Oral QHS McNew, Holly R, MD   5 mg at 04/14/18 2122  . hydrOXYzine (ATARAX/VISTARIL) tablet 50 mg  50 mg Oral TID PRN Clapacs, Jackquline Denmark, MD   50 mg at 04/04/18 2216  . LORazepam (ATIVAN) tablet 1 mg  1 mg Oral Q6H PRN Haskell Riling, MD   1 mg at 04/07/18 2157   Or  . LORazepam (ATIVAN) injection 1 mg  1 mg Intramuscular Q6H PRN Haskell Riling, MD   1 mg at 04/01/18 0820  . magnesium hydroxide (MILK OF MAGNESIA) suspension 30 mL  30 mL Oral Daily PRN Clapacs, John T, MD      . traZODone (DESYREL) tablet 100 mg  100 mg Oral QHS PRN  Clapacs, Jackquline Denmark, MD   100 mg at 04/14/18 2122   PTA Medications: Medications Prior to Admission  Medication Sig Dispense Refill Last Dose  . atorvastatin (LIPITOR) 10 MG tablet Take 1 tablet (10 mg total) by mouth daily at 6 PM. (Patient not taking: Reported on 11/16/2017) 30 tablet 0 Not Taking at Unknown time  . divalproex (DEPAKOTE ER) 500 MG 24 hr tablet Take 1,000 mg by mouth daily.   unknown at unknown  . hydrOXYzine (ATARAX/VISTARIL) 25 MG tablet Take 1 tablet (25 mg total) by mouth 3 (three) times daily. (Patient not taking: Reported on 11/16/2017) 30 tablet 0 Not Taking at Unknown time  . lamoTRIgine (LAMICTAL) 100 MG tablet Take 1 tablet (100 mg total) by mouth every evening. (Patient not taking: Reported on 11/16/2017) 30 tablet 0 Not Taking at Unknown time  . lamoTRIgine (LAMICTAL) 25 MG tablet Take 2 tablets (50 mg total) by mouth daily. (Patient not taking: Reported on 11/16/2017) 60 tablet 0 Not Taking at Unknown time  . loratadine (CLARITIN) 10 MG tablet Take 10 mg by mouth daily.   unknown at unknown  .  LORazepam (ATIVAN) 0.5 MG tablet Take 1 tablet (0.5 mg total) by mouth 2 (two) times daily. (Patient not taking: Reported on 11/16/2017) 60 tablet 0 Not Taking at Unknown time  . OLANZapine zydis (ZYPREXA) 5 MG disintegrating tablet Take 1 tablet (5 mg total) by mouth at bedtime. (Patient not taking: Reported on 11/16/2017) 30 tablet 0 Not Taking at Unknown time  . paliperidone (INVEGA SUSTENNA) 156 MG/ML SUSP injection Inject 1 mL (156 mg total) into the muscle every 28 (twenty-eight) days. NEXT DOSE DUE August 13, 2016 (Patient taking differently: Inject 156 mg into the muscle every 28 (twenty-eight) days. ) 0.9 mL 0   . traZODone (DESYREL) 100 MG tablet Take 1 tablet (100 mg total) by mouth at bedtime. (Patient taking differently: Take 50 mg by mouth at bedtime. ) 30 tablet 0 Not Taking at Unknown time    Patient Stressors: Medication change or noncompliance Substance abuse  Patient  Strengths: General fund of knowledge  Treatment Modalities: Medication Management, Group therapy, Case management,  1 to 1 session with clinician, Psychoeducation, Recreational therapy.   Physician Treatment Plan for Primary Diagnosis: Undifferentiated schizophrenia (HCC) Long Term Goal(s): Improvement in symptoms so as ready for discharge   Short Term Goals: Ability to identify changes in lifestyle to reduce recurrence of condition will improve  Medication Management: Evaluate patient's response, side effects, and tolerance of medication regimen.  Therapeutic Interventions: 1 to 1 sessions, Unit Group sessions and Medication administration.  Evaluation of Outcomes: Progressing  Physician Treatment Plan for Secondary Diagnosis: Principal Problem:   Undifferentiated schizophrenia (HCC)  Long Term Goal(s): Improvement in symptoms so as ready for discharge   Short Term Goals: Ability to identify changes in lifestyle to reduce recurrence of condition will improve     Medication Management: Evaluate patient's response, side effects, and tolerance of medication regimen.  Therapeutic Interventions: 1 to 1 sessions, Unit Group sessions and Medication administration.  Evaluation of Outcomes: Progressing   RN Treatment Plan for Primary Diagnosis: Undifferentiated schizophrenia (HCC) Long Term Goal(s): Knowledge of disease and therapeutic regimen to maintain health will improve  Short Term Goals: Ability to verbalize feelings will improve, Ability to identify and develop effective coping behaviors will improve and Compliance with prescribed medications will improve  Medication Management: RN will administer medications as ordered by provider, will assess and evaluate patient's response and provide education to patient for prescribed medication. RN will report any adverse and/or side effects to prescribing provider.  Therapeutic Interventions: 1 on 1 counseling sessions, Psychoeducation,  Medication administration, Evaluate responses to treatment, Monitor vital signs and CBGs as ordered, Perform/monitor CIWA, COWS, AIMS and Fall Risk screenings as ordered, Perform wound care treatments as ordered.  Evaluation of Outcomes: Progressing   LCSW Treatment Plan for Primary Diagnosis: Undifferentiated schizophrenia (HCC) Long Term Goal(s): Safe transition to appropriate next level of care at discharge, Engage patient in therapeutic group addressing interpersonal concerns.  Short Term Goals: Engage patient in aftercare planning with referrals and resources, Increase social support, Increase emotional regulation, Identify triggers associated with mental health/substance abuse issues and Increase skills for wellness and recovery  Therapeutic Interventions: Assess for all discharge needs, 1 to 1 time with Social worker, Explore available resources and support systems, Assess for adequacy in community support network, Educate family and significant other(s) on suicide prevention, Complete Psychosocial Assessment, Interpersonal group therapy.  Evaluation of Outcomes: Progressing   Progress in Treatment: Attending groups: Yes. Participating in groups: Yes. Taking medication as prescribed: Yes. Toleration medication: Yes. Family/Significant other  contact made: Yes, individual(s) contacted:  Patients Guardian Patient understands diagnosis: Yes. Discussing patient identified problems/goals with staff: Yes. Medical problems stabilized or resolved: Yes. Denies suicidal/homicidal ideation: Yes. Issues/concerns per patient self-inventory: No. Other:   New problem(s) identified: No, Describe:  None  New Short Term/Long Term Goal(s): "Take care of my family."  Patient Goals:   "Take care of my family."  Discharge Plan or Barriers: To discharge to a new group home and engage with outpatient services or ACT Team  Reason for Continuation of Hospitalization: Medication  stabilization  Estimated Length of Stay: 3-5 days Recreational Therapy: Patient Stressors: N/A Patient Goal: Patient will engage in interactions with peers and staff in pro-social manner at least 2x within 5 recreation therapy group sessions   Attendees: Patient:  04/15/2018 11:17 AM  Physician: Corinna Gab, MD 04/15/2018 11:17 AM  Nursing:Shatara Lowell Guitar, RN 04/15/2018 11:17 AM  RN Care Manager: 04/15/2018 11:17 AM  Social Worker: Johny Shears, LCSWA 04/15/2018 11:17 AM  Recreational Therapist: Danella Deis. Dreama Saa, LRT 04/15/2018 11:17 AM  Other: Matilde Bash, LCSW 04/15/2018 11:17 AM  Other: Damian Leavell, [place] 04/15/2018 11:17 AM  Other: 04/15/2018 11:17 AM    Scribe for Treatment Team: Johny Shears, LCSW 04/15/2018 11:17 AM

## 2018-04-15 NOTE — Progress Notes (Signed)
Recreation Therapy Notes  Date: 04/15/2018  Time: 9:30 am   Location: Craft room   Behavioral response: N/A   Intervention Topic: Team Work  Discussion/Intervention: Patient did not attend group.   Clinical Observations/Feedback:  Patient did not attend group.   Suhail Peloquin LRT/CTRS        Will Schier 04/15/2018 12:11 PM 

## 2018-04-16 NOTE — Plan of Care (Signed)
Active in the milieu. Compliant with treatment 

## 2018-04-16 NOTE — Progress Notes (Signed)
[facility] MD Progress Note  04/16/2018 3:06 PM David Davila.  MRN:  [id number] Subjective:  Pt more irritable today but not aggressive. He states that he is doing "alright." He states that he is sick of nurses telling him he needs to take medications and get his blood drawn. He states that he just wants to get ou of the hospital. Discussed that we are trying to get him into new group home. He states, "Just send me to a shelter. You are the one making me go to a group home.' He states that he no longer wants tot alk to me today and to leave his room.   Principal Problem: Undifferentiated schizophrenia ([place]) Diagnosis:   Patient Active Problem List   Diagnosis Date Noted  . Undifferentiated schizophrenia ([place]) [F20.3] 03/26/2018    Priority: High  . TBI (traumatic brain injury) ([place]) [S06.9X9A] 07/10/2016  . Vitamin B12 deficiency [E53.8] 07/10/2016  . Noncompliance with medication regimen [Z91.14] 06/25/2016  . Cocaine use disorder, mild, abuse ([place]) [F14.10] 06/25/2016  . Cannabis use disorder, mild, abuse [F12.10] 06/25/2016  . Paranoid schizophrenia ([place]) [F20.0] 06/24/2016   Total Time spent with patient: 15 minutes  Past Psychiatric History: See H&p  Past Medical History:  Past Medical History:  Diagnosis Date  . Acute psychosis ([place])   . Antisocial personality disorder ([place])   . Cannabis abuse    Past  . Paranoid schizophrenia ([place])   . Schizoaffective disorder, bipolar type ([place])    History reviewed. No pertinent surgical history. Family History: History reviewed. No pertinent family history. Family Psychiatric  History: See H&P Social History:  Social History   Substance and Sexual Activity  Alcohol Use No     Social History   Substance and Sexual Activity  Drug Use Not Currently    Social History   Socioeconomic History  . Marital status: Single    Spouse name: Not on file  . Number of children: Not on file  . Years of education: Not on file  .  Highest education level: Not on file  Occupational History  . Not on file  Social Needs  . Financial resource strain: Not on file  . Food insecurity:    Worry: Not on file    Inability: Not on file  . Transportation needs:    Medical: Not on file    Non-medical: Not on file  Tobacco Use  . Smoking status: Former Smoker    Packs/day: 1.00    Types: Cigarettes  . Smokeless tobacco: Never Used  Substance and Sexual Activity  . Alcohol use: No  . Drug use: Not Currently  . Sexual activity: Not Currently  Lifestyle  . Physical activity:    Days per week: Not on file    Minutes per session: Not on file  . Stress: Not on file  Relationships  . Social connections:    Talks on phone: Not on file    Gets together: Not on file    Attends religious service: Not on file    Active member of club or organization: Not on file    Attends meetings of clubs or organizations: Not on file    Relationship status: Not on file  Other Topics Concern  . Not on file  Social History Narrative  . Not on file   Additional Social History:                         Sleep: Good  Appetite:  Good  Current Medications: Current Facility-Administered Medications  Medication Dose Route Frequency Provider Last Rate Last Dose  . acetaminophen (TYLENOL) tablet 650 mg  650 mg Oral Q6H PRN Clapacs, John T, MD      . alum & mag hydroxide-simeth (MAALOX/MYLANTA) 200-200-20 MG/5ML suspension 30 mL  30 mL Oral Q4H PRN Clapacs, John T, MD      . benztropine (COGENTIN) tablet 1 mg  1 mg Oral BID PRN Jase Reep, Tyson Babinski, MD   1 mg at 04/08/18 2048   Or  . benztropine mesylate (COGENTIN) injection 1 mg  1 mg Intramuscular BID PRN Samon Dishner, Tyson Babinski, MD      . benztropine (COGENTIN) tablet 1 mg  1 mg Oral QHS Jyasia Markoff, Tyson Babinski, MD   1 mg at 04/15/18 2227  . divalproex (DEPAKOTE) DR tablet 1,000 mg  1,000 mg Oral BH-q7a Margarito Dehaas R, MD   1,000 mg at 04/16/18 1215   And  . divalproex (DEPAKOTE) DR tablet 750 mg  750  mg Oral QHS Tiphanie Vo R, MD   750 mg at 04/15/18 2227  . haloperidol (HALDOL) tablet 5 mg  5 mg Oral Q6H PRN Syesha Thaw, Tyson Babinski, MD       Or  . haloperidol lactate (HALDOL) injection 5 mg  5 mg Intramuscular Q6H PRN Kristiann Noyce, Tyson Babinski, MD   5 mg at 04/01/18 0819  . haloperidol (HALDOL) tablet 5 mg  5 mg Oral QHS Darby Fleeman R, MD   5 mg at 04/15/18 2227  . hydrOXYzine (ATARAX/VISTARIL) tablet 50 mg  50 mg Oral TID PRN Clapacs, Madie Reno, MD   50 mg at 04/04/18 2216  . LORazepam (ATIVAN) tablet 1 mg  1 mg Oral Q6H PRN Marylin Crosby, MD   1 mg at 04/07/18 2157   Or  . LORazepam (ATIVAN) injection 1 mg  1 mg Intramuscular Q6H PRN Marylin Crosby, MD   1 mg at 04/01/18 0820  . magnesium hydroxide (MILK OF MAGNESIA) suspension 30 mL  30 mL Oral Daily PRN Clapacs, John T, MD      . traZODone (DESYREL) tablet 100 mg  100 mg Oral QHS PRN Clapacs, Madie Reno, MD   100 mg at 04/14/18 2122    Lab Results: No results found for this or any previous visit (from the past 48 hour(s)).  Blood Alcohol level:  Lab Results  Component Value Date   ETH <10 03/25/2018   ETH <10 75/88/3254    Metabolic Disorder Labs: Lab Results  Component Value Date   HGBA1C 5.4 03/25/2018   MPG 108.28 03/25/2018   No results found for: PROLACTIN Lab Results  Component Value Date   CHOL 184 03/25/2018   TRIG 43 03/25/2018   HDL 50 03/25/2018   CHOLHDL 3.7 03/25/2018   VLDL 9 03/25/2018   LDLCALC 125 (H) 03/25/2018    Physical Findings: AIMS: Facial and Oral Movements Muscles of Facial Expression: None, normal Lips and Perioral Area: None, normal Jaw: None, normal Tongue: None, normal,Extremity Movements Upper (arms, wrists, hands, fingers): None, normal Lower (legs, knees, ankles, toes): None, normal, Trunk Movements Neck, shoulders, hips: None, normal, Overall Severity Severity of abnormal movements (highest score from questions above): None, normal Incapacitation due to abnormal movements: None, normal Patient's  awareness of abnormal movements (rate only patient's report): No Awareness, Dental Status Current problems with teeth and/or dentures?: No Does patient usually wear dentures?: No  CIWA:  CIWA-Ar Total: 0 COWS:  COWS Total Score: 0  Musculoskeletal: Strength & Muscle Tone: within normal limits Gait & Station: normal Patient leans: N/A  Psychiatric Specialty Exam: Physical Exam  Nursing note and vitals reviewed.   Review of Systems  All other systems reviewed and are negative.   Blood pressure 105/70, pulse 90, temperature 97.9 F (36.6 C), temperature source Oral, resp. rate 16, height '5\' 8"'  (1.727 m), weight 58.1 kg, SpO2 98 %.Body mass index is 19.46 kg/m.  General Appearance: Casual  Eye Contact:  Minimal  Speech:  Clear and Coherent  Volume:  Normal  Mood:  Irritable  Affect:  Appropriate  Thought Process:  Coherent and Goal Directed  Orientation:  Full (Time, Place, and Person)  Thought Content:  Logical  Suicidal Thoughts:  No  Homicidal Thoughts:  No  Memory:  Immediate;   Fair  Judgement:  Impaired  Insight:  Lacking  Psychomotor Activity:  Normal  Concentration:  Concentration: Fair  Recall:  [employer] of Knowledge:  Fair  Language:  Fair  Akathisia:  No      Assets:  Resilience  ADL's:  Intact  Cognition:  WNL  Sleep:  Number of Hours: 7.15     Treatment Plan Summary: 37 yo male admitted due to aggression at group home. He is waiting placement at group home. He is a bit more irritable today due to him still being in the hospital but no longer agitated or aggressive.   Plan:  Schizoaffective disorder -Next [place] injection 234 mg due on 04/30/18 -Continue Depakote 1000 mg qam and 750 mg qhs. He is refusing blood draw to check Depakote level  Dispo -CSW looking into placement into group home  Marylin Crosby, MD 04/16/2018, 3:06 PM

## 2018-04-16 NOTE — Progress Notes (Signed)
Patient agreed to take Depakote at the above time.

## 2018-04-16 NOTE — Progress Notes (Signed)
Recreation Therapy Notes   Date: 04/16/2018  Time: 9:30 am   Location: Craft room   Behavioral response: N/A   Intervention Topic: Coping  Discussion/Intervention: Patient did not attend group.   Clinical Observations/Feedback:  Patient did not attend group.   Shelagh Rayman LRT/CTRS        Blu Mcglaun 04/16/2018 10:47 AM 

## 2018-04-16 NOTE — [facility] Group Notes (Signed)
LCSW Group Therapy Note  04/16/2018 1:15pm  Type of Therapy/Topic:  Group Therapy:  Balance in Life  Participation Level:  None  Description of Group:    This group will address the concept of balance and how it feels and looks when one is unbalanced. Patients will be encouraged to process areas in their lives that are out of balance and identify reasons for remaining unbalanced. Facilitators will guide patients in utilizing problem-solving interventions to address and correct the stressor making their life unbalanced. Understanding and applying boundaries will be explored and addressed for obtaining and maintaining a balanced life. Patients will be encouraged to explore ways to assertively make their unbalanced needs known to significant others in their lives, using other group members and facilitator for support and feedback.  Therapeutic Goals: 1. Patient will identify two or more emotions or situations they have that consume much of in their lives. 2. Patient will identify signs/triggers that life has become out of balance:  3. Patient will identify two ways to set boundaries in order to achieve balance in their lives:  4. Patient will demonstrate ability to communicate their needs through discussion and/or role plays  Summary of Patient Progress:  David Davila attended this group but did not participate.   Therapeutic Modalities:   Cognitive Behavioral Therapy Solution-Focused Therapy Assertiveness Training  Alease Frame, [place] 04/16/2018 2:57 PM

## 2018-04-16 NOTE — Progress Notes (Addendum)
Patient was visible in the milieu until bed time. Calm and cooperative. Continues to endorse paranoia. Guarded and suspicious but denying thoughts of self harm. Denies hallucinations. Able to express his feelings and needs.  Patient attended group, had a snack then received his bedtime medications. Had no medical/health concerns. Currently in bed sleeping. Safety and security maintained.  0700: Patient slept throughout the night. Refused his Depakote in AM and stated "I am not taking it". Asked this Clinical research associate to leave the room.

## 2018-04-16 NOTE — Plan of Care (Addendum)
Patient alert and oriented to self, place and situation. Remains isolative to his room, will come out for meals. Minimal interaction noted. Irritable today, states, "I just want to go home." Refused to take Depakote this morning, will attempt at a later time. Denies SI/HI/AVH and pain at this time. Milieu remains safe with q 15 minute safety checks. Will continue to monitor.

## 2018-04-17 NOTE — Progress Notes (Signed)
Recreation Therapy Notes  Date: 04/17/2018  Time: 9:30 pm   Location: Craft Room   Behavioral response: N/A   Intervention Topic: Happiness  Discussion/Intervention: Patient did not attend group.   Clinical Observations/Feedback:  Patient did not attend group.   Trevian Hayashida LRT/CTRS        David Davila 04/17/2018 1:49 PM 

## 2018-04-17 NOTE — [facility] Group Notes (Signed)
[facility] LCSW Group Therapy Note  Date/Time: 04/17/18, 1300  Type of Therapy and Topic:  Group Therapy:  Feelings around Relapse and Recovery  Participation Level:  Did Not Attend   Mood:  Description of Group:    Patients in this group will discuss emotions they experience before and after a relapse. They will process how experiencing these feelings, or avoidance of experiencing them, relates to having a relapse. Facilitator will guide patients to explore emotions they have related to recovery. Patients will be encouraged to process which emotions are more powerful. They will be guided to discuss the emotional reaction significant others in their lives may have to patients' relapse or recovery. Patients will be assisted in exploring ways to respond to the emotions of others without this contributing to a relapse.  Therapeutic Goals: 1. Patient will identify two or more emotions that lead to relapse for them:  2. Patient will identify two emotions that result when they relapse:  3. Patient will identify two emotions related to recovery:  4. Patient will demonstrate ability to communicate their needs through discussion and/or role plays.   Summary of Patient Progress:     Therapeutic Modalities:   Cognitive Behavioral Therapy Solution-Focused Therapy Assertiveness Training Relapse Prevention Therapy  Greg Shlome Baldree, LCSW       

## 2018-04-17 NOTE — Progress Notes (Signed)
[facility] MD Progress Note  04/17/2018 2:59 PM David Davila.  MRN:  [id number] Subjective:  Pt is mostly isolative to his room. He is less irritable today. He appears depressed and states that he just wants to get out of the hospital. He denies any symptosm or complaints. Denies SI, HI, AH. VH. He has been compliant with medications.   Principal Problem: Undifferentiated schizophrenia (HCC) Diagnosis:   Patient Active Problem List   Diagnosis Date Noted  . Undifferentiated schizophrenia (HCC) [F20.3] 03/26/2018    Priority: High  . TBI (traumatic brain injury) (HCC) [S06.9X9A] 07/10/2016  . Vitamin B12 deficiency [E53.8] 07/10/2016  . Noncompliance with medication regimen [Z91.14] 06/25/2016  . Cocaine use disorder, mild, abuse (HCC) [F14.10] 06/25/2016  . Cannabis use disorder, mild, abuse [F12.10] 06/25/2016  . Paranoid schizophrenia (HCC) [F20.0] 06/24/2016   Total Time spent with patient: 15 minutes  Past Psychiatric History: See H&p  Past Medical History:  Past Medical History:  Diagnosis Date  . Acute psychosis (HCC)   . Antisocial personality disorder (HCC)   . Cannabis abuse    Past  . Paranoid schizophrenia (HCC)   . Schizoaffective disorder, bipolar type (HCC)    History reviewed. No pertinent surgical history. Family History: History reviewed. No pertinent family history. Family Psychiatric  History: See H&P Social History:  Social History   Substance and Sexual Activity  Alcohol Use No     Social History   Substance and Sexual Activity  Drug Use Not Currently    Social History   Socioeconomic History  . Marital status: Single    Spouse name: Not on file  . Number of children: Not on file  . Years of education: Not on file  . Highest education level: Not on file  Occupational History  . Not on file  Social Needs  . Financial resource strain: Not on file  . Food insecurity:    Worry: Not on file    Inability: Not on file  . Transportation  needs:    Medical: Not on file    Non-medical: Not on file  Tobacco Use  . Smoking status: Former Smoker    Packs/day: 1.00    Types: Cigarettes  . Smokeless tobacco: Never Used  Substance and Sexual Activity  . Alcohol use: No  . Drug use: Not Currently  . Sexual activity: Not Currently  Lifestyle  . Physical activity:    Days per week: Not on file    Minutes per session: Not on file  . Stress: Not on file  Relationships  . Social connections:    Talks on phone: Not on file    Gets together: Not on file    Attends religious service: Not on file    Active member of club or organization: Not on file    Attends meetings of clubs or organizations: Not on file    Relationship status: Not on file  Other Topics Concern  . Not on file  Social History Narrative  . Not on file   Additional Social History:                         Sleep: Good  Appetite:  Good  Current Medications: Current Facility-Administered Medications  Medication Dose Route Frequency Provider Last Rate Last Dose  . acetaminophen (TYLENOL) tablet 650 mg  650 mg Oral Q6H PRN Clapacs, Jackquline Denmark, MD      . alum & mag hydroxide-simeth (MAALOX/MYLANTA) 200-200-20 MG/5ML  suspension 30 mL  30 mL Oral Q4H PRN Clapacs, John T, MD      . benztropine (COGENTIN) tablet 1 mg  1 mg Oral BID PRN Alvine Mostafa, Ileene Hutchinson, MD   1 mg at 04/08/18 2048   Or  . benztropine mesylate (COGENTIN) injection 1 mg  1 mg Intramuscular BID PRN Arriyana Rodell, Ileene Hutchinson, MD      . benztropine (COGENTIN) tablet 1 mg  1 mg Oral QHS David Davila, Ileene Hutchinson, MD   1 mg at 04/16/18 2136  . divalproex (DEPAKOTE) DR tablet 1,000 mg  1,000 mg Oral BH-q7a David Davila, Ileene Hutchinson, MD   1,000 mg at 04/17/18 0849   And  . divalproex (DEPAKOTE) DR tablet 750 mg  750 mg Oral QHS David Deavers R, MD   750 mg at 04/16/18 2137  . haloperidol (HALDOL) tablet 5 mg  5 mg Oral Q6H PRN Katharin Schneider, Ileene Hutchinson, MD       Or  . haloperidol lactate (HALDOL) injection 5 mg  5 mg Intramuscular Q6H PRN  Lucca Greggs, Ileene Hutchinson, MD   5 mg at 04/01/18 0819  . haloperidol (HALDOL) tablet 5 mg  5 mg Oral QHS David Davila, Ileene Hutchinson, MD   5 mg at 04/16/18 2134  . hydrOXYzine (ATARAX/VISTARIL) tablet 50 mg  50 mg Oral TID PRN Clapacs, Jackquline Denmark, MD   50 mg at 04/16/18 2134  . LORazepam (ATIVAN) tablet 1 mg  1 mg Oral Q6H PRN Haskell Riling, MD   1 mg at 04/16/18 2134   Or  . LORazepam (ATIVAN) injection 1 mg  1 mg Intramuscular Q6H PRN Haskell Riling, MD   1 mg at 04/01/18 0820  . magnesium hydroxide (MILK OF MAGNESIA) suspension 30 mL  30 mL Oral Daily PRN Clapacs, John T, MD      . traZODone (DESYREL) tablet 100 mg  100 mg Oral QHS PRN Clapacs, Jackquline Denmark, MD   100 mg at 04/14/18 2122    Lab Results: No results found for this or any previous visit (from the past 48 hour(s)).  Blood Alcohol level:  Lab Results  Component Value Date   ETH <10 03/25/2018   ETH <10 12/06/2017    Metabolic Disorder Labs: Lab Results  Component Value Date   HGBA1C 5.4 03/25/2018   MPG 108.28 03/25/2018   No results found for: PROLACTIN Lab Results  Component Value Date   CHOL 184 03/25/2018   TRIG 43 03/25/2018   HDL 50 03/25/2018   CHOLHDL 3.7 03/25/2018   VLDL 9 03/25/2018   LDLCALC 125 (H) 03/25/2018    Physical Findings: AIMS: Facial and Oral Movements Muscles of Facial Expression: None, normal Lips and Perioral Area: None, normal Jaw: None, normal Tongue: None, normal,Extremity Movements Upper (arms, wrists, hands, fingers): None, normal Lower (legs, knees, ankles, toes): None, normal, Trunk Movements Neck, shoulders, hips: None, normal, Overall Severity Severity of abnormal movements (highest score from questions above): None, normal Incapacitation due to abnormal movements: None, normal Patient's awareness of abnormal movements (rate only patient's report): No Awareness, Dental Status Current problems with teeth and/or dentures?: No Does patient usually wear dentures?: No  CIWA:  CIWA-Ar Total: 0 COWS:  COWS  Total Score: 0  Musculoskeletal: Strength & Muscle Tone: within normal limits Gait & Station: normal Patient leans: N/A  Psychiatric Specialty Exam: Physical Exam  Nursing note and vitals reviewed.   Review of Systems  All other systems reviewed and are negative.   Blood pressure 104/60, pulse 98, temperature  98.4 F (36.9 C), temperature source Oral, resp. rate 18, height 5\' 8"  (1.727 m), weight 58.1 kg, SpO2 96 %.Body mass index is 19.46 kg/m.  General Appearance: Disheveled  Eye Contact:  Minimal  Speech:  Minimal  Volume:  Normal  Mood:  Depressed  Affect:  Flat  Thought Process:  Coherent  Orientation:  Full (Time, Place, and Person)  Thought Content:  Logical  Suicidal Thoughts:  No  Homicidal Thoughts:  No  Memory:  Immediate;   Fair  Judgement:  Impaired  Insight:  Lacking  Psychomotor Activity:  Normal  Concentration:  Concentration: Poor  Recall:  [employer] of Knowledge:  Fair  Language:  Fair  Akathisia:  No      Assets:  Resilience  ADL's:  Intact  Cognition:  WNL  Sleep:  Number of Hours: 7     Treatment Plan Summary: 37 yo male admitted due to agitation at group home. He has been calm and appropriate on he unit. He is waiting placement at new group home.   Plan:  Schizoaffective disorder -next [place] injection due on 04/30/18 -Continue Depakote 1000 mg qam and 750 mg qhs -Continue Haldol 5 mg qpm  Dispo -Waiting placement into group home  Haskell Riling, MD 04/17/2018, 2:59 PM

## 2018-04-17 NOTE — Progress Notes (Signed)
D: Patient alert and oriented x 4, denies SI/HI/AVH but noted responding to internal stimuli. Patient is pleasant and cooperative with treatment,  Patient's speech is coherent non pressured, he appears less anxious and he is interacting with peers and staff appropriately.  A: Patient was offered support and encouragement, and was given scheduled medications.Patient was also encouraged to attend groups, and 15 minute checks were maintained  for safety.  R:Patient did not attend evening group, he is complaint with medication, and has no complaints.Safety maintained on unit, will continue to monitor.

## 2018-04-17 NOTE — Progress Notes (Addendum)
D: Pt A & O to self, place and time. Denies SI, HI, AVH and pain when assessed, however, pt presents preoccupied. Pleasant on interactions. Isolative to room, OOB for meals and medications. Took his medications without issues. Showered, changed clothes and bed linens as encouraged. Pt did not attend scheduled groups despite multiple prompts. Apologetic to staff for sharing his shirt with a peer on the unit. A: Introduced self to pt. Emotional support and availability provided to pt as needed. All medications administered with verbal education and effects monitored. Encouraged pt to voice concerns, attend to ADLs and comply with current treatment regimen. Unit rules reviewed with pt (not sharing personal items). Safety checks maintained at Q 15 minutes intervals without outburst or self harm gestures to note thus far.   R: Pt compliant with medications when offered. Denies adverse drug reactions when assessed. Tolerates all PO intake well. Remains safe on unit.

## 2018-04-17 NOTE — Progress Notes (Signed)
FL2 sent to [facility].  Voicemail left asking about status and if they received it.  [phone]. Garner Nash, MSW, LCSW Clinical Social Worker 04/17/2018 3:28 PM

## 2018-04-18 NOTE — [facility] Group Notes (Deleted)
[facility] Group Notes:  (Nursing/MHT/Case Management/Adjunct)  Date:  04/18/2018  Time:  1:21 AM  Type of Therapy:  Group Therapy  Participation Level:  Active  Participation Quality:  Appropriate  Affect:  Appropriate  Cognitive:  Appropriate  Insight:  Appropriate  Engagement in Group:  Engaged  Modes of Intervention:  Discussion  Summary of Progress/Problems: Lanora Manis reports high depression, anxiety,and hopelessness. Elizabeth marked yes on self inventory sheet as to having thoughts of self-harm or suicide on this day, but did not indicate the frequency. Lanora Manis reports sleeping well. Lanora Manis stated her goal was to help others. Lanora Manis also wants to be admitted to long term care. Lanora Manis denies withdrawal symptoms and pain. Lanora Manis has been talking with other patients and seemed to be in a pleasant mood throughout group. MHT reviewed the rules and expectations of the unit. MHT addressed concerns with patients sharing clothes. MHT processed with patients why it was a rule and why it had to be followed. MHT processed with patients about the snack schedule. MHT addressed concerns patients expressed during wrap up. MHT provided patients with self-inventory sheet to complete.  Jinger Neighbors 04/18/2018, 1:21 AM

## 2018-04-18 NOTE — Progress Notes (Signed)
D:Patient alert and oriented x 4, denies SI/HI/AVHbut noted responding to internal stimuli.Patientis pleasant and cooperativewith treatment, his thoughts are organized,  speech is non pressured,he appears less anxious and he isinteracting with peers and staff appropriately.  A:Patientwas offered support and encouragement, and wasgiven scheduled medications.Patientwasalsoencouraged to attend groups, and15 minute checks weremaintainedfor safety.  R:Patient did not attend evening group, he is complaint withmedication, andhas no complaints.Safety maintained on unit, will continue to monitor.

## 2018-04-18 NOTE — Progress Notes (Signed)
Isolative to self and room. Minimal interaction with staff and peers. Comes out for meals. Answers questions, forwards minimal.  Encouragement and support offered. Safety checks maintained. Denies SI, HI. Remains safe on unit with q 15 min checks.

## 2018-04-18 NOTE — Plan of Care (Signed)
  Problem: Education: Goal: Emotional status will improve Outcome: Progressing Goal: Mental status will improve Outcome: Progressing   Problem: Activity: Goal: Interest or engagement in activities will improve Outcome: Not Progressing Goal: Sleeping patterns will improve Outcome: Not Progressing

## 2018-04-18 NOTE — Plan of Care (Signed)
  Problem: Activity: Goal: Will verbalize the importance of balancing activity with adequate rest periods Outcome: Progressing  Patient appears less psychotic.  

## 2018-04-18 NOTE — [facility] Group Notes (Signed)
LCSW Group Therapy Note   04/18/2018 1:15pm   Type of Therapy and Topic:  Group Therapy:  Trust and Honesty  Participation Level:  Did Not Attend  Description of Group:    In this group patients will be asked to explore the value of being honest.  Patients will be guided to discuss their thoughts, feelings, and behaviors related to honesty and trusting in others. Patients will process together how trust and honesty relate to forming relationships with peers, family members, and self. Each patient will be challenged to identify and express feelings of being vulnerable. Patients will discuss reasons why people are dishonest and identify alternative outcomes if one was truthful (to self or others). This group will be process-oriented, with patients participating in exploration of their own experiences, giving and receiving support, and processing challenge from other group members.   Therapeutic Goals: 1. Patient will identify why honesty is important to relationships and how honesty overall affects relationships.  2. Patient will identify a situation where they lied or were lied too and the  feelings, thought process, and behaviors surrounding the situation 3. Patient will identify the meaning of being vulnerable, how that feels, and how that correlates to being honest with self and others. 4. Patient will identify situations where they could have told the truth, but instead lied and explain reasons of dishonesty.   Summary of Patient Progress:Pt was invited to attend group but chose not to attend. CSW will continue to encourage pt to attend group throughout their admission.     Therapeutic Modalities:   Cognitive Behavioral Therapy Solution Focused Therapy Motivational Interviewing Brief Therapy  David Blazina  CUEBAS-COLON, LCSW 04/18/2018 12:55 PM

## 2018-04-18 NOTE — Progress Notes (Signed)
[facility] MD Progress Note  04/18/2018 4:10 PM David Davila.  MRN:  [id number] Subjective: Follow-up note for patient was schizophrenia.  Patient was in bed today but easily arousable.  He had no complaints.  Denied having hallucinations.  Denied having any suicidal thoughts.  No physical complaints.  Tolerating medicine okay.  Seems to be taking care of ADLs all right. Principal Problem: Undifferentiated schizophrenia (HCC) Diagnosis:   Patient Active Problem List   Diagnosis Date Noted  . Undifferentiated schizophrenia (HCC) [F20.3] 03/26/2018  . TBI (traumatic brain injury) (HCC) [S06.9X9A] 07/10/2016  . Vitamin B12 deficiency [E53.8] 07/10/2016  . Noncompliance with medication regimen [Z91.14] 06/25/2016  . Cocaine use disorder, mild, abuse (HCC) [F14.10] 06/25/2016  . Cannabis use disorder, mild, abuse [F12.10] 06/25/2016  . Paranoid schizophrenia (HCC) [F20.0] 06/24/2016   Total Time spent with patient: 20 minutes  Past Psychiatric History: Past history of schizophrenia and trouble maintaining living situation  Past Medical History:  Past Medical History:  Diagnosis Date  . Acute psychosis (HCC)   . Antisocial personality disorder (HCC)   . Cannabis abuse    Past  . Paranoid schizophrenia (HCC)   . Schizoaffective disorder, bipolar type (HCC)    History reviewed. No pertinent surgical history. Family History: History reviewed. No pertinent family history. Family Psychiatric  History: See previous note Social History:  Social History   Substance and Sexual Activity  Alcohol Use No     Social History   Substance and Sexual Activity  Drug Use Not Currently    Social History   Socioeconomic History  . Marital status: Single    Spouse name: Not on file  . Number of children: Not on file  . Years of education: Not on file  . Highest education level: Not on file  Occupational History  . Not on file  Social Needs  . Financial resource strain: Not on file  .  Food insecurity:    Worry: Not on file    Inability: Not on file  . Transportation needs:    Medical: Not on file    Non-medical: Not on file  Tobacco Use  . Smoking status: Former Smoker    Packs/day: 1.00    Types: Cigarettes  . Smokeless tobacco: Never Used  Substance and Sexual Activity  . Alcohol use: No  . Drug use: Not Currently  . Sexual activity: Not Currently  Lifestyle  . Physical activity:    Days per week: Not on file    Minutes per session: Not on file  . Stress: Not on file  Relationships  . Social connections:    Talks on phone: Not on file    Gets together: Not on file    Attends religious service: Not on file    Active member of club or organization: Not on file    Attends meetings of clubs or organizations: Not on file    Relationship status: Not on file  Other Topics Concern  . Not on file  Social History Narrative  . Not on file   Additional Social History:                         Sleep: Fair  Appetite:  Fair  Current Medications: Current Facility-Administered Medications  Medication Dose Route Frequency Provider Last Rate Last Dose  . acetaminophen (TYLENOL) tablet 650 mg  650 mg Oral Q6H PRN Clapacs, Jackquline Denmark, MD      . alum & mag  hydroxide-simeth (MAALOX/MYLANTA) 200-200-20 MG/5ML suspension 30 mL  30 mL Oral Q4H PRN Clapacs, John T, MD      . benztropine (COGENTIN) tablet 1 mg  1 mg Oral BID PRN McNew, Ileene Hutchinson, MD   1 mg at 04/08/18 2048   Or  . benztropine mesylate (COGENTIN) injection 1 mg  1 mg Intramuscular BID PRN McNew, Ileene Hutchinson, MD      . benztropine (COGENTIN) tablet 1 mg  1 mg Oral QHS McNew, Ileene Hutchinson, MD   1 mg at 04/17/18 2145  . divalproex (DEPAKOTE) DR tablet 1,000 mg  1,000 mg Oral BH-q7a McNew, Ileene Hutchinson, MD   1,000 mg at 04/18/18 1610   And  . divalproex (DEPAKOTE) DR tablet 750 mg  750 mg Oral QHS McNew, Holly R, MD   750 mg at 04/17/18 2145  . haloperidol (HALDOL) tablet 5 mg  5 mg Oral Q6H PRN McNew, Ileene Hutchinson, MD        Or  . haloperidol lactate (HALDOL) injection 5 mg  5 mg Intramuscular Q6H PRN McNew, Ileene Hutchinson, MD   5 mg at 04/01/18 0819  . haloperidol (HALDOL) tablet 5 mg  5 mg Oral QHS McNew, Holly R, MD   5 mg at 04/17/18 2145  . hydrOXYzine (ATARAX/VISTARIL) tablet 50 mg  50 mg Oral TID PRN Clapacs, Jackquline Denmark, MD   50 mg at 04/16/18 2134  . LORazepam (ATIVAN) tablet 1 mg  1 mg Oral Q6H PRN Haskell Riling, MD   1 mg at 04/16/18 2134   Or  . LORazepam (ATIVAN) injection 1 mg  1 mg Intramuscular Q6H PRN Haskell Riling, MD   1 mg at 04/01/18 0820  . magnesium hydroxide (MILK OF MAGNESIA) suspension 30 mL  30 mL Oral Daily PRN Clapacs, John T, MD      . traZODone (DESYREL) tablet 100 mg  100 mg Oral QHS PRN Clapacs, Jackquline Denmark, MD   100 mg at 04/14/18 2122    Lab Results: No results found for this or any previous visit (from the past 48 hour(s)).  Blood Alcohol level:  Lab Results  Component Value Date   ETH <10 03/25/2018   ETH <10 12/06/2017    Metabolic Disorder Labs: Lab Results  Component Value Date   HGBA1C 5.4 03/25/2018   MPG 108.28 03/25/2018   No results found for: PROLACTIN Lab Results  Component Value Date   CHOL 184 03/25/2018   TRIG 43 03/25/2018   HDL 50 03/25/2018   CHOLHDL 3.7 03/25/2018   VLDL 9 03/25/2018   LDLCALC 125 (H) 03/25/2018    Physical Findings: AIMS: Facial and Oral Movements Muscles of Facial Expression: None, normal Lips and Perioral Area: None, normal Jaw: None, normal Tongue: None, normal,Extremity Movements Upper (arms, wrists, hands, fingers): None, normal Lower (legs, knees, ankles, toes): None, normal, Trunk Movements Neck, shoulders, hips: None, normal, Overall Severity Severity of abnormal movements (highest score from questions above): None, normal Incapacitation due to abnormal movements: None, normal Patient's awareness of abnormal movements (rate only patient's report): No Awareness, Dental Status Current problems with teeth and/or dentures?:  No Does patient usually wear dentures?: No  CIWA:  CIWA-Ar Total: 0 COWS:  COWS Total Score: 0  Musculoskeletal: Strength & Muscle Tone: within normal limits Gait & Station: normal Patient leans: N/A  Psychiatric Specialty Exam: Physical Exam  Nursing note and vitals reviewed. Constitutional: He appears well-developed and well-nourished.  HENT:  Head: Normocephalic and atraumatic.  Eyes: Pupils are  equal, round, and reactive to light. Conjunctivae are normal.  Neck: Normal range of motion.  Cardiovascular: Regular rhythm and normal heart sounds.  Respiratory: Effort normal. No respiratory distress.  GI: Soft.  Musculoskeletal: Normal range of motion.  Neurological: He is alert.  Skin: Skin is warm and dry.  Psychiatric: His affect is blunt. His speech is delayed. He is slowed. Thought content is not paranoid. Cognition and memory are impaired. He expresses impulsivity. He expresses no homicidal and no suicidal ideation.    Review of Systems  Constitutional: Negative.   HENT: Negative.   Eyes: Negative.   Respiratory: Negative.   Cardiovascular: Negative.   Gastrointestinal: Negative.   Musculoskeletal: Negative.   Skin: Negative.   Neurological: Negative.   Psychiatric/Behavioral: Negative.     Blood pressure 111/65, pulse 66, temperature 98.4 F (36.9 C), temperature source Oral, resp. rate 18, height 5\' 8"  (1.727 m), weight 58.1 kg, SpO2 96 %.Body mass index is 19.46 kg/m.  General Appearance: Casual  Eye Contact:  Minimal  Speech:  Slow  Volume:  Decreased  Mood:  Euthymic  Affect:  Constricted  Thought Process:  Goal Directed  Orientation:  Full (Time, Place, and Person)  Thought Content:  Logical  Suicidal Thoughts:  No  Homicidal Thoughts:  No  Memory:  Immediate;   Fair Recent;   Fair Remote;   Fair  Judgement:  Fair  Insight:  Shallow  Psychomotor Activity:  Decreased  Concentration:  Concentration: Fair  Recall:  [employer] of Knowledge:  Fair   Language:  Fair  Akathisia:  No  Handed:  Right  AIMS (if indicated):     Assets:  Desire for Improvement Housing Physical Health Social Support  ADL's:  Intact  Cognition:  Impaired,  Mild  Sleep:  Number of Hours: 7.75     Treatment Plan Summary: Daily contact with patient to assess and evaluate symptoms and progress in treatment, Medication management and Plan No change to medication management.  Supportive counseling and review of treatment plan.  Mordecai Rasmussen, MD 04/18/2018, 4:10 PM

## 2018-04-18 NOTE — [facility] Group Notes (Signed)
[facility] Group Notes:  (Nursing/MHT/Case Management/Adjunct)  Date:  04/18/2018  Time:  1:34 AM  Type of Therapy:  Group Therapy  Participation Level:  Active  Participation Quality:  Attentive  Affect:  Flat  Cognitive:  Appropriate  Insight:  Appropriate  Engagement in Group:  Engaged  Modes of Intervention:  Discussion  Summary of Progress/Problems: Paris attended group. Shia did not complete his self-inventory sheet. Huzaifa stated he could not complete the form. Jabraylon seemed to be attentive throughout group. Gustaf did not report any problems on this day. MHT reviewed the rules and expectations of the unit. MHT addressed concerns with patients sharing clothes. MHT processed with patients why it was a rule and why it had to be followed. MHT processed with patients about the snack schedule. MHT addressed concerns patients expressed during wrap up. MHT provided patients with self-inventory sheet to complete. Jinger Neighbors 04/18/2018, 1:34 AM

## 2018-04-19 NOTE — Progress Notes (Signed)
Patient continues to isolate to self and room. Denies SI, HI, AVH. Comes out for meals. In no distress. Safety maintained. Encouragement provided

## 2018-04-19 NOTE — [facility] Group Notes (Signed)
LCSW Group Therapy Note 04/19/2018 1:15pm  Type of Therapy and Topic: Group Therapy: Feelings Around Returning Home & Establishing a Supportive Framework and Supporting Oneself When Supports Not Available  Participation Level: Did Not Attend  Description of Group:  Patients first processed thoughts and feelings about upcoming discharge. These included fears of upcoming changes, lack of change, new living environments, judgements and expectations from others and overall stigma of mental health issues. The group then discussed the definition of a supportive framework, what that looks and feels like, and how do to discern it from an unhealthy non-supportive network. The group identified different types of supports as well as what to do when your family/friends are less than helpful or unavailable  Therapeutic Goals  1. Patient will identify one healthy supportive network that they can use at discharge. 2. Patient will identify one factor of a supportive framework and how to tell it from an unhealthy network. 3. Patient able to identify one coping skill to use when they do not have positive supports from others. 4. Patient will demonstrate ability to communicate their needs through discussion and/or role plays.  Summary of Patient Progress:  Pt was invited to attend group but chose not to attend. CSW will continue to encourage pt to attend group throughout their admission.   Therapeutic Modalities Cognitive Behavioral Therapy Motivational Interviewing   David Davila  CUEBAS-COLON, LCSW 04/19/2018 11:01 AM 

## 2018-04-19 NOTE — Progress Notes (Signed)
Patient ID: David Maupin., male   DOB: 02-20-1981, 37 y.o.   MRN: [id number] PER STATE REGULATIONS 482.30  THIS CHART WAS REVIEWED FOR MEDICAL NECESSITY WITH RESPECT TO THE PATIENT'S ADMISSION/DURATION OF STAY.  NEXT REVIEW DATE:04/23/18  Loura Halt, RN, BSN CASE MANAGER

## 2018-04-19 NOTE — Progress Notes (Signed)
Patient was visible in the milieu until bed time. Presented to the medication room, calm and cooperative and requested his bedtime medications. Denied thoughts of self harm. Patient went to his room and has been resting. Staff continue to monitor for safety.

## 2018-04-19 NOTE — Plan of Care (Signed)
Visible in the milieu, compliant with treatment 

## 2018-04-19 NOTE — Plan of Care (Signed)
  Problem: Safety: Goal: Periods of time without injury will increase Outcome: Progressing  Patient remains safe on unit.

## 2018-04-19 NOTE — Progress Notes (Signed)
[facility] MD Progress Note  04/19/2018 11:31 AM David Davila.  MRN:  [id number] Subjective: Follow-up patient was schizophrenia.  Patient has no complaints.  Denies hallucinations.  Denies suicidal ideation.  Mostly stays in bed all of the time little activity.  Not aggressive or violent.  Tolerating medicine adequately. Principal Problem: Undifferentiated schizophrenia (HCC) Diagnosis:   Patient Active Problem List   Diagnosis Date Noted  . Undifferentiated schizophrenia (HCC) [F20.3] 03/26/2018  . TBI (traumatic brain injury) (HCC) [S06.9X9A] 07/10/2016  . Vitamin B12 deficiency [E53.8] 07/10/2016  . Noncompliance with medication regimen [Z91.14] 06/25/2016  . Cocaine use disorder, mild, abuse (HCC) [F14.10] 06/25/2016  . Cannabis use disorder, mild, abuse [F12.10] 06/25/2016  . Paranoid schizophrenia (HCC) [F20.0] 06/24/2016   Total Time spent with patient: 15 minutes  Past Psychiatric History: History of recurrent episodes of psychosis as well as chronic intellectual disability  Past Medical History:  Past Medical History:  Diagnosis Date  . Acute psychosis (HCC)   . Antisocial personality disorder (HCC)   . Cannabis abuse    Past  . Paranoid schizophrenia (HCC)   . Schizoaffective disorder, bipolar type (HCC)    History reviewed. No pertinent surgical history. Family History: History reviewed. No pertinent family history. Family Psychiatric  History: See previous note Social History:  Social History   Substance and Sexual Activity  Alcohol Use No     Social History   Substance and Sexual Activity  Drug Use Not Currently    Social History   Socioeconomic History  . Marital status: Single    Spouse name: Not on file  . Number of children: Not on file  . Years of education: Not on file  . Highest education level: Not on file  Occupational History  . Not on file  Social Needs  . Financial resource strain: Not on file  . Food insecurity:    Worry: Not on  file    Inability: Not on file  . Transportation needs:    Medical: Not on file    Non-medical: Not on file  Tobacco Use  . Smoking status: Former Smoker    Packs/day: 1.00    Types: Cigarettes  . Smokeless tobacco: Never Used  Substance and Sexual Activity  . Alcohol use: No  . Drug use: Not Currently  . Sexual activity: Not Currently  Lifestyle  . Physical activity:    Days per week: Not on file    Minutes per session: Not on file  . Stress: Not on file  Relationships  . Social connections:    Talks on phone: Not on file    Gets together: Not on file    Attends religious service: Not on file    Active member of club or organization: Not on file    Attends meetings of clubs or organizations: Not on file    Relationship status: Not on file  Other Topics Concern  . Not on file  Social History Narrative  . Not on file   Additional Social History:                         Sleep: Fair  Appetite:  Fair  Current Medications: Current Facility-Administered Medications  Medication Dose Route Frequency Provider Last Rate Last Dose  . acetaminophen (TYLENOL) tablet 650 mg  650 mg Oral Q6H PRN Zamier Eggebrecht T, MD      . alum & mag hydroxide-simeth (MAALOX/MYLANTA) 200-200-20 MG/5ML suspension 30 mL  30  mL Oral Q4H PRN Claribel Sachs T, MD      . benztropine (COGENTIN) tablet 1 mg  1 mg Oral BID PRN McNew, Ileene Hutchinson, MD   1 mg at 04/08/18 2048   Or  . benztropine mesylate (COGENTIN) injection 1 mg  1 mg Intramuscular BID PRN McNew, Ileene Hutchinson, MD      . benztropine (COGENTIN) tablet 1 mg  1 mg Oral QHS McNew, Ileene Hutchinson, MD   1 mg at 04/18/18 2113  . divalproex (DEPAKOTE) DR tablet 1,000 mg  1,000 mg Oral BH-q7a McNew, Ileene Hutchinson, MD   1,000 mg at 04/19/18 9147   And  . divalproex (DEPAKOTE) DR tablet 750 mg  750 mg Oral QHS McNew, Holly R, MD   750 mg at 04/18/18 2113  . haloperidol (HALDOL) tablet 5 mg  5 mg Oral Q6H PRN McNew, Ileene Hutchinson, MD       Or  . haloperidol lactate  (HALDOL) injection 5 mg  5 mg Intramuscular Q6H PRN McNew, Ileene Hutchinson, MD   5 mg at 04/01/18 0819  . haloperidol (HALDOL) tablet 5 mg  5 mg Oral QHS McNew, Holly R, MD   5 mg at 04/18/18 2113  . hydrOXYzine (ATARAX/VISTARIL) tablet 50 mg  50 mg Oral TID PRN Hagen Bohorquez, Jackquline Denmark, MD   50 mg at 04/16/18 2134  . LORazepam (ATIVAN) tablet 1 mg  1 mg Oral Q6H PRN Haskell Riling, MD   1 mg at 04/16/18 2134   Or  . LORazepam (ATIVAN) injection 1 mg  1 mg Intramuscular Q6H PRN Haskell Riling, MD   1 mg at 04/01/18 0820  . magnesium hydroxide (MILK OF MAGNESIA) suspension 30 mL  30 mL Oral Daily PRN Aviendha Azbell T, MD      . traZODone (DESYREL) tablet 100 mg  100 mg Oral QHS PRN Nekeisha Aure, Jackquline Denmark, MD   100 mg at 04/14/18 2122    Lab Results: No results found for this or any previous visit (from the past 48 hour(s)).  Blood Alcohol level:  Lab Results  Component Value Date   ETH <10 03/25/2018   ETH <10 12/06/2017    Metabolic Disorder Labs: Lab Results  Component Value Date   HGBA1C 5.4 03/25/2018   MPG 108.28 03/25/2018   No results found for: PROLACTIN Lab Results  Component Value Date   CHOL 184 03/25/2018   TRIG 43 03/25/2018   HDL 50 03/25/2018   CHOLHDL 3.7 03/25/2018   VLDL 9 03/25/2018   LDLCALC 125 (H) 03/25/2018    Physical Findings: AIMS: Facial and Oral Movements Muscles of Facial Expression: None, normal Lips and Perioral Area: None, normal Jaw: None, normal Tongue: None, normal,Extremity Movements Upper (arms, wrists, hands, fingers): None, normal Lower (legs, knees, ankles, toes): None, normal, Trunk Movements Neck, shoulders, hips: None, normal, Overall Severity Severity of abnormal movements (highest score from questions above): None, normal Incapacitation due to abnormal movements: None, normal Patient's awareness of abnormal movements (rate only patient's report): No Awareness, Dental Status Current problems with teeth and/or dentures?: No Does patient usually wear  dentures?: No  CIWA:  CIWA-Ar Total: 0 COWS:  COWS Total Score: 0  Musculoskeletal: Strength & Muscle Tone: within normal limits Gait & Station: normal Patient leans: N/A  Psychiatric Specialty Exam: Physical Exam  Nursing note and vitals reviewed. Constitutional: He appears well-developed and well-nourished.  HENT:  Head: Normocephalic and atraumatic.  Eyes: Pupils are equal, round, and reactive to light. Conjunctivae are normal.  Neck: Normal range of motion.  Cardiovascular: Regular rhythm and normal heart sounds.  Respiratory: Effort normal. No respiratory distress.  GI: Soft.  Musculoskeletal: Normal range of motion.  Neurological: He is alert.  Skin: Skin is warm and dry.  Psychiatric: His affect is blunt. His speech is delayed. He is slowed. Cognition and memory are impaired. He expresses inappropriate judgment. He expresses no homicidal and no suicidal ideation.    Review of Systems  Constitutional: Negative.   HENT: Negative.   Eyes: Negative.   Respiratory: Negative.   Cardiovascular: Negative.   Gastrointestinal: Negative.   Musculoskeletal: Negative.   Skin: Negative.   Neurological: Negative.   Psychiatric/Behavioral: Negative.     Blood pressure 105/70, pulse 86, temperature 99 F (37.2 C), temperature source Oral, resp. rate 16, height 5\' 8"  (1.727 m), weight 58.1 kg, SpO2 98 %.Body mass index is 19.46 kg/m.  General Appearance: Casual  Eye Contact:  Minimal  Speech:  Blocked  Volume:  Decreased  Mood:  Euthymic  Affect:  Constricted  Thought Process:  Goal Directed  Orientation:  Full (Time, Place, and Person)  Thought Content:  Illogical and Rumination  Suicidal Thoughts:  No  Homicidal Thoughts:  No  Memory:  Immediate;   Fair Recent;   Fair Remote;   Fair  Judgement:  Fair  Insight:  Shallow  Psychomotor Activity:  Decreased  Concentration:  Concentration: Poor  Recall:  [employer] of Knowledge:  Fair  Language:  Fair  Akathisia:  No   Handed:  Right  AIMS (if indicated):     Assets:  Desire for Improvement Physical Health  ADL's:  Impaired  Cognition:  Impaired,  Mild  Sleep:  Number of Hours: 6.5     Treatment Plan Summary: Daily contact with patient to assess and evaluate symptoms and progress in treatment, Medication management and Plan No change to overall treatment plan.  Continue current medicine.  Encourage patient to get up and be more active so that discharge planning can proceed.  Mordecai Rasmussen, MD 04/19/2018, 11:31 AM

## 2018-04-20 NOTE — Progress Notes (Signed)
Recreation Therapy Notes  Date: 04/20/2018  Time: 9:30 am   Location: Craft room   Behavioral response: N/A   Intervention Topic: Communication  Discussion/Intervention: Patient did not attend group.   Clinical Observations/Feedback:  Patient did not attend group.   Taisei Bonnette LRT/CTRS        Maxemiliano Riel 04/20/2018 10:27 AM

## 2018-04-20 NOTE — Progress Notes (Signed)
D:Patient alert and oriented x4,denies SI/HI/AVHbut noted responding to internal stimuli, pacing the unit. Patientis pleasant and cooperativewith treatment, his thoughts are disorganized,  speech isnon pressured,he appears less anxious and he isinteracting with peers and staff appropriately.  A:Patientwas offered support and encouragement, and wasgiven scheduled medications.Patientwasalsoencouraged to attend groups, and15 minute checks weremaintainedfor safety.  R:Patient did not attend evening group, he is complaint withmedication, andhas no complaints.Safety maintained on unit, will continue to monitor.

## 2018-04-20 NOTE — Plan of Care (Signed)
  Problem: Education: Goal: Knowledge of [facility] Education information/materials will improve Outcome: Progressing  Patient has insight unit health information.

## 2018-04-20 NOTE — Progress Notes (Signed)
Received David Davila this AM asleep in his bed. He has been OOB in the milieu for his meals. He denied all of the psychiatric symptoms and waiting patiently for a group home placement.

## 2018-04-20 NOTE — Tx Team (Signed)
Interdisciplinary Treatment and Diagnostic Plan Update  04/20/2018 Time of Session: 11:30am David Davila. MRN: [id number]  Principal Diagnosis: Undifferentiated schizophrenia (HCC)  Secondary Diagnoses: Principal Problem:   Undifferentiated schizophrenia (HCC)   Current Medications:  Current Facility-Administered Medications  Medication Dose Route Frequency Provider Last Rate Last Dose  . acetaminophen (TYLENOL) tablet 650 mg  650 mg Oral Q6H PRN Clapacs, John T, MD      . alum & mag hydroxide-simeth (MAALOX/MYLANTA) 200-200-20 MG/5ML suspension 30 mL  30 mL Oral Q4H PRN Clapacs, John T, MD      . benztropine (COGENTIN) tablet 1 mg  1 mg Oral BID PRN McNew, Ileene Hutchinson, MD   1 mg at 04/08/18 2048   Or  . benztropine mesylate (COGENTIN) injection 1 mg  1 mg Intramuscular BID PRN McNew, Ileene Hutchinson, MD      . benztropine (COGENTIN) tablet 1 mg  1 mg Oral QHS McNew, Ileene Hutchinson, MD   1 mg at 04/19/18 2156  . divalproex (DEPAKOTE) DR tablet 1,000 mg  1,000 mg Oral BH-q7a McNew, Ileene Hutchinson, MD   1,000 mg at 04/20/18 4098   And  . divalproex (DEPAKOTE) DR tablet 750 mg  750 mg Oral QHS McNew, Holly R, MD   750 mg at 04/19/18 2155  . haloperidol (HALDOL) tablet 5 mg  5 mg Oral Q6H PRN McNew, Ileene Hutchinson, MD       Or  . haloperidol lactate (HALDOL) injection 5 mg  5 mg Intramuscular Q6H PRN McNew, Ileene Hutchinson, MD   5 mg at 04/01/18 0819  . haloperidol (HALDOL) tablet 5 mg  5 mg Oral QHS McNew, Holly R, MD   5 mg at 04/19/18 2156  . hydrOXYzine (ATARAX/VISTARIL) tablet 50 mg  50 mg Oral TID PRN Clapacs, Jackquline Denmark, MD   50 mg at 04/16/18 2134  . LORazepam (ATIVAN) tablet 1 mg  1 mg Oral Q6H PRN Haskell Riling, MD   1 mg at 04/16/18 2134   Or  . LORazepam (ATIVAN) injection 1 mg  1 mg Intramuscular Q6H PRN Haskell Riling, MD   1 mg at 04/01/18 0820  . magnesium hydroxide (MILK OF MAGNESIA) suspension 30 mL  30 mL Oral Daily PRN Clapacs, John T, MD      . traZODone (DESYREL) tablet 100 mg  100 mg Oral QHS PRN  Clapacs, Jackquline Denmark, MD   100 mg at 04/19/18 2156   PTA Medications: Medications Prior to Admission  Medication Sig Dispense Refill Last Dose  . atorvastatin (LIPITOR) 10 MG tablet Take 1 tablet (10 mg total) by mouth daily at 6 PM. (Patient not taking: Reported on 11/16/2017) 30 tablet 0 Not Taking at Unknown time  . divalproex (DEPAKOTE ER) 500 MG 24 hr tablet Take 1,000 mg by mouth daily.   unknown at unknown  . hydrOXYzine (ATARAX/VISTARIL) 25 MG tablet Take 1 tablet (25 mg total) by mouth 3 (three) times daily. (Patient not taking: Reported on 11/16/2017) 30 tablet 0 Not Taking at Unknown time  . lamoTRIgine (LAMICTAL) 100 MG tablet Take 1 tablet (100 mg total) by mouth every evening. (Patient not taking: Reported on 11/16/2017) 30 tablet 0 Not Taking at Unknown time  . lamoTRIgine (LAMICTAL) 25 MG tablet Take 2 tablets (50 mg total) by mouth daily. (Patient not taking: Reported on 11/16/2017) 60 tablet 0 Not Taking at Unknown time  . loratadine (CLARITIN) 10 MG tablet Take 10 mg by mouth daily.   unknown at unknown  .  LORazepam (ATIVAN) 0.5 MG tablet Take 1 tablet (0.5 mg total) by mouth 2 (two) times daily. (Patient not taking: Reported on 11/16/2017) 60 tablet 0 Not Taking at Unknown time  . OLANZapine zydis (ZYPREXA) 5 MG disintegrating tablet Take 1 tablet (5 mg total) by mouth at bedtime. (Patient not taking: Reported on 11/16/2017) 30 tablet 0 Not Taking at Unknown time  . paliperidone (INVEGA SUSTENNA) 156 MG/ML SUSP injection Inject 1 mL (156 mg total) into the muscle every 28 (twenty-eight) days. NEXT DOSE DUE August 13, 2016 (Patient taking differently: Inject 156 mg into the muscle every 28 (twenty-eight) days. ) 0.9 mL 0   . traZODone (DESYREL) 100 MG tablet Take 1 tablet (100 mg total) by mouth at bedtime. (Patient taking differently: Take 50 mg by mouth at bedtime. ) 30 tablet 0 Not Taking at Unknown time    Patient Stressors: Medication change or noncompliance Substance abuse  Patient  Strengths: General fund of knowledge  Treatment Modalities: Medication Management, Group therapy, Case management,  1 to 1 session with clinician, Psychoeducation, Recreational therapy.   Physician Treatment Plan for Primary Diagnosis: Undifferentiated schizophrenia (HCC) Long Term Goal(s): Improvement in symptoms so as ready for discharge   Short Term Goals: Ability to identify changes in lifestyle to reduce recurrence of condition will improve  Medication Management: Evaluate patient's response, side effects, and tolerance of medication regimen.  Therapeutic Interventions: 1 to 1 sessions, Unit Group sessions and Medication administration.  Evaluation of Outcomes: Progressing   9/9:Schizoaffective disorder -Next [place]ga Sustenna injection due on 04/30/18 -Continue Depakote 1000 mg qam and 750 mg qhs -Continue Haldol 5 mg dailyPt is in good mood today. He is smiling and less irritable. He is hoping to get an interview with a group home so he can get out of the hospital. He states taht he is feeling good. He states, "I'm not talking to myself anymore."    Physician Treatment Plan for Secondary Diagnosis: Principal Problem:   Undifferentiated schizophrenia (HCC)  Long Term Goal(s): Improvement in symptoms so as ready for discharge   Short Term Goals: Ability to identify changes in lifestyle to reduce recurrence of condition will improve     Medication Management: Evaluate patient's response, side effects, and tolerance of medication regimen.  Therapeutic Interventions: 1 to 1 sessions, Unit Group sessions and Medication administration.  Evaluation of Outcomes: Progressing   RN Treatment Plan for Primary Diagnosis: Undifferentiated schizophrenia (HCC) Long Term Goal(s): Knowledge of disease and therapeutic regimen to maintain health will improve  Short Term Goals: Ability to verbalize feelings will improve, Ability to identify and develop effective coping behaviors will improve  and Compliance with prescribed medications will improve  Medication Management: RN will administer medications as ordered by provider, will assess and evaluate patient's response and provide education to patient for prescribed medication. RN will report any adverse and/or side effects to prescribing provider.  Therapeutic Interventions: 1 on 1 counseling sessions, Psychoeducation, Medication administration, Evaluate responses to treatment, Monitor vital signs and CBGs as ordered, Perform/monitor CIWA, COWS, AIMS and Fall Risk screenings as ordered, Perform wound care treatments as ordered.  Evaluation of Outcomes: Progressing   LCSW Treatment Plan for Primary Diagnosis: Undifferentiated schizophrenia (HCC) Long Term Goal(s): Safe transition to appropriate next level of care at discharge, Engage patient in therapeutic group addressing interpersonal concerns.  Short Term Goals: Engage patient in aftercare planning with referrals and resources, Increase social support, Increase emotional regulation, Identify triggers associated with mental health/substance abuse issues and Increase skills for  wellness and recovery  Therapeutic Interventions: Assess for all discharge needs, 1 to 1 time with Social worker, Explore available resources and support systems, Assess for adequacy in community support network, Educate family and significant other(s) on suicide prevention, Complete Psychosocial Assessment, Interpersonal group therapy.  Evaluation of Outcomes: Progressing   Progress in Treatment: Attending groups: Yes. Participating in groups: Yes. Taking medication as prescribed: Yes. Toleration medication: Yes. Family/Significant other contact made: Yes, individual(s) contacted:  Patients Guardian Patient understands diagnosis: Yes. Discussing patient identified problems/goals with staff: Yes. Medical problems stabilized or resolved: Yes. Denies suicidal/homicidal ideation: Yes. Issues/concerns per  patient self-inventory: No. Other:   New problem(s) identified: No, Describe:  None  New Short Term/Long Term Goal(s): "Take care of my family."  Patient Goals:   "Take care of my family."  Discharge Plan or Barriers: To discharge to a new group home and engage with outpatient services or ACT Team  Reason for Continuation of Hospitalization: Medication stabilization  Estimated Length of Stay: 3-5 days Recreational Therapy: Patient Stressors: N/A Patient Goal: Patient will engage in interactions with peers and staff in pro-social manner at least 2x within 5 recreation therapy group sessions   Attendees: Patient:  04/20/2018 10:22 AM  Physician: Corinna Gab, MD 04/20/2018 10:22 AM  Nursing:Shatara Lowell Guitar, RN 04/20/2018 10:22 AM  RN Care Manager: 04/20/2018 10:22 AM  Social Worker: Johny Shears, LCSWA 04/20/2018 10:22 AM  Recreational Therapist: Danella Deis. Dreama Saa, LRT 04/20/2018 10:22 AM  Other: Matilde Bash, LCSW 04/20/2018 10:22 AM  Other: Damian Leavell, Chaplin 04/20/2018 10:22 AM  Other: 04/20/2018 10:22 AM    Scribe for Treatment Team: [name], LCSW 04/20/2018 10:22 AM

## 2018-04-20 NOTE — Plan of Care (Signed)
  Problem: Education: Goal: Emotional status will improve Outcome: Progressing  Emotionally improving

## 2018-04-20 NOTE — Progress Notes (Signed)
[facility] MD Progress Note  04/20/2018 3:40 PM David Davila.  MRN:  [id number] Subjective:  Pt is in good mood today. He is smiling and less irritable. He is hoping to get an interview with a group home so he can get out of the hospital. He states taht he is feeling good. He states, "I'm not talking to myself anymore." he states taht he feels the medications are helping with this. He denies SI, HI, AH, VH. He is organized in thoughts. He is eating and sleeping well.   Principal Problem: Undifferentiated schizophrenia (HCC) Diagnosis:   Patient Active Problem List   Diagnosis Date Noted  . Undifferentiated schizophrenia (HCC) [F20.3] 03/26/2018    Priority: High  . TBI (traumatic brain injury) (HCC) [S06.9X9A] 07/10/2016  . Vitamin B12 deficiency [E53.8] 07/10/2016  . Noncompliance with medication regimen [Z91.14] 06/25/2016  . Cocaine use disorder, mild, abuse (HCC) [F14.10] 06/25/2016  . Cannabis use disorder, mild, abuse [F12.10] 06/25/2016  . 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 Acute psychosis (HCC)   . Antisocial personality disorder (HCC)   . Cannabis abuse    Past  . Paranoid schizophrenia (HCC)   . Schizoaffective disorder, bipolar type (HCC)    History reviewed. No pertinent surgical history. Family History: History reviewed. No pertinent family history. Family Psychiatric  History: See H&P Social History:  Social History   Substance and Sexual Activity  Alcohol Use No     Social History   Substance and Sexual Activity  Drug Use Not Currently    Social History   Socioeconomic History  . Marital status: Single    Spouse name: Not on file  . Number of children: Not on file  . Years of education: Not on file  . Highest education level: Not on file  Occupational History  . Not on file  Social Needs   . Financial resource strain: Not on file  . Food insecurity:    Worry: Not on file    Inability: Not on file  . Transportation needs:    Medical: Not on file    Non-medical: Not on file  Tobacco Use  . Smoking status: Former Smoker    Packs/day: 1.00    Types: Cigarettes  . Smokeless tobacco: Never Used  Substance and Sexual Activity  . Alcohol use: No  . Drug use: Not Currently  . Sexual activity: Not Currently  Lifestyle  . Physical activity:    Days per week: Not on file    Minutes per session: Not on file  . Stress: Not on file  Relationships  . Social connections:    Talks on phone: Not on file    Gets together: Not on file    Attends religious service: Not on file    Active member of club or organization: Not on file    Attends meetings of clubs or organizations: Not on file    Relationship status: Not on file  Other Topics Concern  . Not on file  Social History Narrative  . Not on file   Additional Social History:                         Sleep: Good  Appetite:  Good  Current Medications: Current Facility-Administered Medications  Medication Dose Route Frequency Provider Last Rate Last Dose  .  acetaminophen (TYLENOL) tablet 650 mg  650 mg Oral Q6H PRN Clapacs, John T, MD      . alum & mag hydroxide-simeth (MAALOX/MYLANTA) 200-200-20 MG/5ML suspension 30 mL  30 mL Oral Q4H PRN Clapacs, John T, MD      . benztropine (COGENTIN) tablet 1 mg  1 mg Oral BID PRN Nivedita Mirabella, Ileene Hutchinson, MD   1 mg at 04/08/18 2048   Or  . benztropine mesylate (COGENTIN) injection 1 mg  1 mg Intramuscular BID PRN Priscella Donna, Ileene Hutchinson, MD      . benztropine (COGENTIN) tablet 1 mg  1 mg Oral QHS Haidy Kackley, Ileene Hutchinson, MD   1 mg at 04/19/18 2156  . divalproex (DEPAKOTE) DR tablet 1,000 mg  1,000 mg Oral BH-q7a Dora Clauss, Ileene Hutchinson, MD   1,000 mg at 04/20/18 4270   And  . divalproex (DEPAKOTE) DR tablet 750 mg  750 mg Oral QHS Demetrice Combes R, MD   750 mg at 04/19/18 2155  . haloperidol (HALDOL) tablet 5  mg  5 mg Oral Q6H PRN Moneka Mcquinn, Ileene Hutchinson, MD       Or  . haloperidol lactate (HALDOL) injection 5 mg  5 mg Intramuscular Q6H PRN Phelix Fudala, Ileene Hutchinson, MD   5 mg at 04/01/18 0819  . haloperidol (HALDOL) tablet 5 mg  5 mg Oral QHS Loreto Loescher R, MD   5 mg at 04/19/18 2156  . hydrOXYzine (ATARAX/VISTARIL) tablet 50 mg  50 mg Oral TID PRN Clapacs, Jackquline Denmark, MD   50 mg at 04/16/18 2134  . LORazepam (ATIVAN) tablet 1 mg  1 mg Oral Q6H PRN Haskell Riling, MD   1 mg at 04/16/18 2134   Or  . LORazepam (ATIVAN) injection 1 mg  1 mg Intramuscular Q6H PRN Haskell Riling, MD   1 mg at 04/01/18 0820  . magnesium hydroxide (MILK OF MAGNESIA) suspension 30 mL  30 mL Oral Daily PRN Clapacs, John T, MD      . traZODone (DESYREL) tablet 100 mg  100 mg Oral QHS PRN Clapacs, Jackquline Denmark, MD   100 mg at 04/19/18 2156    Lab Results: No results found for this or any previous visit (from the past 48 hour(s)).  Blood Alcohol level:  Lab Results  Component Value Date   ETH <10 03/25/2018   ETH <10 12/06/2017    Metabolic Disorder Labs: Lab Results  Component Value Date   HGBA1C 5.4 03/25/2018   MPG 108.28 03/25/2018   No results found for: PROLACTIN Lab Results  Component Value Date   CHOL 184 03/25/2018   TRIG 43 03/25/2018   HDL 50 03/25/2018   CHOLHDL 3.7 03/25/2018   VLDL 9 03/25/2018   LDLCALC 125 (H) 03/25/2018    Physical Findings: AIMS: Facial and Oral Movements Muscles of Facial Expression: None, normal Lips and Perioral Area: None, normal Jaw: None, normal Tongue: None, normal,Extremity Movements Upper (arms, wrists, hands, fingers): None, normal Lower (legs, knees, ankles, toes): None, normal, Trunk Movements Neck, shoulders, hips: None, normal, Overall Severity Severity of abnormal movements (highest score from questions above): None, normal Incapacitation due to abnormal movements: None, normal Patient's awareness of abnormal movements (rate only patient's report): No Awareness, Dental  Status Current problems with teeth and/or dentures?: No Does patient usually wear dentures?: No  CIWA:  CIWA-Ar Total: 0 COWS:  COWS Total Score: 0  Musculoskeletal: Strength & Muscle Tone: within normal limits Gait & Station: normal Patient leans: N/A  Psychiatric Specialty Exam: Physical  Exam  Nursing note and vitals reviewed.   Review of Systems  All other systems reviewed and are negative.   Blood pressure 99/63, pulse 91, temperature 98.4 F (36.9 C), temperature source Oral, resp. rate 18, height 5\' 8"  (1.727 m), weight 58.1 kg, SpO2 98 %.Body mass index is 19.46 kg/m.  General Appearance: Casual  Eye Contact:  Good  Speech:  Clear and Coherent  Volume:  Normal  Mood:  Euthymic  Affect:  Appropriate  Thought Process:  Coherent and Goal Directed  Orientation:  Full (Time, Place, and Person)  Thought Content:  Logical  Suicidal Thoughts:  no  Homicidal Thoughts:  No  Memory:  Immediate;   Fair  Judgement:  Fair  Insight:  Fair  Psychomotor Activity:  Normal  Concentration:  Concentration: Fair  Recall:  [employer] of Knowledge:  Fair  Language:  Fair  Akathisia:  No      Assets:  Resilience  ADL's:  Intact  Cognition:  WNL  Sleep:  Number of Hours: 7     Treatment Plan Summary: 37 yo male admitted due to decompensation of psychosis. He is in good mood today. Denies any complaints. No episodes of aggression.   Plan:  Schizoaffective disorder -Next [place] injection due on 04/30/18 -Continue Depakote 1000 mg qam and 750 mg qhs -Continue Haldol 5 mg daily  Dispo -Waiting placement into group home   Haskell Riling, MD 04/20/2018, 3:40 PM

## 2018-04-21 NOTE — Progress Notes (Signed)
[facility] MD Progress Note  04/21/2018 2:01 PM David Davila.  MRN:  [id number] Subjective:  Pt mostly isolative to his room today. He has not had any behavioral issues on the unit. He states that he is doing well. HE has no complaints today. He states that he is going to do some laundry today. He denies SI, HI, AH, VH. He is organized and goal directed. I spoke with his guardian today who is also working on placement. She will come to the unit for a meeting on Friday.   Principal Problem: Undifferentiated schizophrenia (HCC) Diagnosis:   Patient Active Problem List   Diagnosis Date Noted  . Undifferentiated schizophrenia (HCC) [F20.3] 03/26/2018    Priority: High  . TBI (traumatic brain injury) (HCC) [S06.9X9A] 07/10/2016  . Vitamin B12 deficiency [E53.8] 07/10/2016  . Noncompliance with medication regimen [Z91.14] 06/25/2016  . Cocaine use disorder, mild, abuse (HCC) [F14.10] 06/25/2016  . Cannabis use disorder, mild, abuse [F12.10] 06/25/2016  . 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 Acute psychosis (HCC)   . Antisocial personality disorder (HCC)   . Cannabis abuse    Past  . Paranoid schizophrenia (HCC)   . Schizoaffective disorder, bipolar type (HCC)    History reviewed. No pertinent surgical history. Family History: History reviewed. No pertinent family history. Family Psychiatric  History: See H&P Social History:  Social History   Substance and Sexual Activity  Alcohol Use No     Social History   Substance and Sexual Activity  Drug Use Not Currently    Social History   Socioeconomic History  . Marital status: Single    Spouse name: Not on file  . Number of children: Not on file  . Years of education: Not on file  . Highest education level: Not on file  Occupational History  . Not on file  Social  Needs  . Financial resource strain: Not on file  . Food insecurity:    Worry: Not on file    Inability: Not on file  . Transportation needs:    Medical: Not on file    Non-medical: Not on file  Tobacco Use  . Smoking status: Former Smoker    Packs/day: 1.00    Types: Cigarettes  . Smokeless tobacco: Never Used  Substance and Sexual Activity  . Alcohol use: No  . Drug use: Not Currently  . Sexual activity: Not Currently  Lifestyle  . Physical activity:    Days per week: Not on file    Minutes per session: Not on file  . Stress: Not on file  Relationships  . Social connections:    Talks on phone: Not on file    Gets together: Not on file    Attends religious service: Not on file    Active member of club or organization: Not on file    Attends meetings of clubs or organizations: Not on file    Relationship status: Not on file  Other Topics Concern  . Not on file  Social History Narrative  . Not on file   Additional Social History:                         Sleep: Good  Appetite:  Good  Current Medications: Current Facility-Administered Medications  Medication Dose Route Frequency Provider Last Rate Last  Dose  . acetaminophen (TYLENOL) tablet 650 mg  650 mg Oral Q6H PRN Clapacs, John T, MD      . alum & mag hydroxide-simeth (MAALOX/MYLANTA) 200-200-20 MG/5ML suspension 30 mL  30 mL Oral Q4H PRN Clapacs, John T, MD      . benztropine (COGENTIN) tablet 1 mg  1 mg Oral BID PRN Patti Shorb, Ileene Hutchinson, MD   1 mg at 04/08/18 2048   Or  . benztropine mesylate (COGENTIN) injection 1 mg  1 mg Intramuscular BID PRN Dayton Kenley, Ileene Hutchinson, MD      . benztropine (COGENTIN) tablet 1 mg  1 mg Oral QHS Lalia Loudon, Ileene Hutchinson, MD   1 mg at 04/20/18 2128  . divalproex (DEPAKOTE) DR tablet 1,000 mg  1,000 mg Oral BH-q7a Quetzally Callas R, MD   1,000 mg at 04/21/18 0630   And  . divalproex (DEPAKOTE) DR tablet 750 mg  750 mg Oral QHS Bates Collington R, MD   750 mg at 04/20/18 2128  . haloperidol (HALDOL)  tablet 5 mg  5 mg Oral Q6H PRN Naithan Delage, Ileene Hutchinson, MD       Or  . haloperidol lactate (HALDOL) injection 5 mg  5 mg Intramuscular Q6H PRN Gilles Trimpe, Ileene Hutchinson, MD   5 mg at 04/01/18 0819  . haloperidol (HALDOL) tablet 5 mg  5 mg Oral QHS Shaquayla Klimas R, MD   5 mg at 04/20/18 2127  . hydrOXYzine (ATARAX/VISTARIL) tablet 50 mg  50 mg Oral TID PRN Clapacs, Jackquline Denmark, MD   50 mg at 04/16/18 2134  . LORazepam (ATIVAN) tablet 1 mg  1 mg Oral Q6H PRN Haskell Riling, MD   1 mg at 04/16/18 2134   Or  . LORazepam (ATIVAN) injection 1 mg  1 mg Intramuscular Q6H PRN Haskell Riling, MD   1 mg at 04/01/18 0820  . magnesium hydroxide (MILK OF MAGNESIA) suspension 30 mL  30 mL Oral Daily PRN Clapacs, John T, MD      . traZODone (DESYREL) tablet 100 mg  100 mg Oral QHS PRN Clapacs, Jackquline Denmark, MD   100 mg at 04/19/18 2156    Lab Results: No results found for this or any previous visit (from the past 48 hour(s)).  Blood Alcohol level:  Lab Results  Component Value Date   ETH <10 03/25/2018   ETH <10 12/06/2017    Metabolic Disorder Labs: Lab Results  Component Value Date   HGBA1C 5.4 03/25/2018   MPG 108.28 03/25/2018   No results found for: PROLACTIN Lab Results  Component Value Date   CHOL 184 03/25/2018   TRIG 43 03/25/2018   HDL 50 03/25/2018   CHOLHDL 3.7 03/25/2018   VLDL 9 03/25/2018   LDLCALC 125 (H) 03/25/2018    Physical Findings: AIMS: Facial and Oral Movements Muscles of Facial Expression: None, normal Lips and Perioral Area: None, normal Jaw: None, normal Tongue: None, normal,Extremity Movements Upper (arms, wrists, hands, fingers): None, normal Lower (legs, knees, ankles, toes): None, normal, Trunk Movements Neck, shoulders, hips: None, normal, Overall Severity Severity of abnormal movements (highest score from questions above): None, normal Incapacitation due to abnormal movements: None, normal Patient's awareness of abnormal movements (rate only patient's report): No Awareness, Dental  Status Current problems with teeth and/or dentures?: No Does patient usually wear dentures?: No  CIWA:  CIWA-Ar Total: 0 COWS:  COWS Total Score: 0  Musculoskeletal: Strength & Muscle Tone: within normal limits Gait & Station: normal Patient leans: N/A  Psychiatric  Specialty Exam: Physical Exam  Nursing note and vitals reviewed.   Review of Systems  All other systems reviewed and are negative.   Blood pressure 95/67, pulse 90, temperature 98.3 F (36.8 C), temperature source Oral, resp. rate 16, height 5\' 8"  (1.727 m), weight 58.1 kg, SpO2 97 %.Body mass index is 19.46 kg/m.  General Appearance: Disheveled  Eye Contact:  Fair  Speech:  Clear and Coherent  Volume:  Normal  Mood:  Euthymic  Affect:  Appropriate  Thought Process:  Coherent and Goal Directed  Orientation:  Full (Time, Place, and Person)  Thought Content:  Logical  Suicidal Thoughts:  No  Homicidal Thoughts:  No  Memory:  Immediate;   Fair  Judgement:  Impaired  Insight:  Lacking  Psychomotor Activity:  Normal  Concentration:  Concentration: Poor  Recall:  [employer] of Knowledge:  Fair  Language:  Fair  Akathisia:  No      Assets:  Resilience  ADL's:  Intact  Cognition:  WNL  Sleep:  Number of Hours: 8     Treatment Plan Summary: 37 yo male admitted due to decompensation of psychosis. He has been calm and appropriate on the unit. He is waiting placement to group home.   Plan:  Schizoaffective disorder -Next [place] injection due on 04/30/18 -Continue Depakote 1000 mg qam and 750 mg qhs -Continue Haldol 5 mg daily  Dispo -Waiting placement into group home. Meeting with guardian scheduled for Friday at 1130 am  Haskell Riling, MD 04/21/2018, 2:01 PM

## 2018-04-21 NOTE — Plan of Care (Signed)
Patient has verbalized understanding of his prescribed therapeutic regimen as well as the general information that's been provided to him and all questions and concerns have been addressed and answered at this time. Patient denies SI/HI/AVH as well as any signs/symptoms of depression and anxiety. Although patient continues to sleep throughout the morning, he has been observed attending and participating in the afternoon unit groups without any issues thus far. Patient has the ability to verbalize his anger and frustrations into appropriate behaviors and has been demonstrating self-control on the unit. Patient has the ability to identify the available resources that can assist him in meeting his health- care needs, however, he has not voiced anything to this Clinical research associate as of yet.  Patient has been working towards maintaining his clinical measurements within normal limits. Patient has been present for meals and has achieved adequate nutrition. Patient participates in self-care when best suits him. Patient has been free from injury thus far and remains safe on this unit at this time.  Problem: Education: Goal: Knowledge of [place] [facility] Education information/materials will improve 04/21/2018 1717 by Doyce Para, RN Outcome: Progressing 04/21/2018 1659 by Doyce Para, RN Outcome: Progressing Goal: Emotional status will improve 04/21/2018 1717 by Doyce Para, RN Outcome: Progressing 04/21/2018 1659 by Doyce Para, RN Outcome: Progressing Goal: Mental status will improve 04/21/2018 1717 by Doyce Para, RN Outcome: Progressing 04/21/2018 1659 by Doyce Para, RN Outcome: Progressing Goal: Verbalization of understanding the information provided will improve 04/21/2018 1717 by Doyce Para, RN Outcome: Progressing 04/21/2018 1659 by Doyce Para, RN Outcome: Progressing   Problem: Activity: Goal: Interest or engagement in activities will  improve 04/21/2018 1717 by Doyce Para, RN Outcome: Progressing 04/21/2018 1659 by Doyce Para, RN Outcome: Progressing Goal: Sleeping patterns will improve 04/21/2018 1717 by Doyce Para, RN Outcome: Progressing 04/21/2018 1659 by Doyce Para, RN Outcome: Progressing   Problem: Coping: Goal: Ability to verbalize frustrations and anger appropriately will improve 04/21/2018 1717 by Doyce Para, RN Outcome: Progressing 04/21/2018 1659 by Doyce Para, RN Outcome: Progressing Goal: Ability to demonstrate self-control will improve 04/21/2018 1717 by Doyce Para, RN Outcome: Progressing 04/21/2018 1659 by Doyce Para, RN Outcome: Progressing   Problem: Health Behavior/Discharge Planning: Goal: Identification of resources available to assist in meeting health care needs will improve 04/21/2018 1717 by Doyce Para, RN Outcome: Progressing 04/21/2018 1659 by Doyce Para, RN Outcome: Progressing Goal: Compliance with treatment plan for underlying cause of condition will improve 04/21/2018 1717 by Doyce Para, RN Outcome: Progressing 04/21/2018 1659 by Doyce Para, RN Outcome: Progressing   Problem: Physical Regulation: Goal: Ability to maintain clinical measurements within normal limits will improve 04/21/2018 1717 by Doyce Para, RN Outcome: Progressing 04/21/2018 1659 by Doyce Para, RN Outcome: Progressing   Problem: Safety: Goal: Periods of time without injury will increase 04/21/2018 1717 by Doyce Para, RN Outcome: Progressing 04/21/2018 1659 by Doyce Para, RN Outcome: Progressing   Problem: Activity: Goal: Will verbalize the importance of balancing activity with adequate rest periods 04/21/2018 1717 by Doyce Para, RN Outcome: Progressing 04/21/2018 1659 by Doyce Para, RN Outcome: Progressing   Problem: Education: Goal: Will be free of psychotic  symptoms 04/21/2018 1717 by Doyce Para, RN Outcome: Progressing 04/21/2018 1659 by Doyce Para, RN Outcome: Progressing Goal: Knowledge of the prescribed therapeutic regimen will improve 04/21/2018 1717 by Doyce Para, RN Outcome: Progressing 04/21/2018 1659 by Doyce Para, RN Outcome: Progressing   Problem: Coping: Goal: Coping ability will improve 04/21/2018 1717 by Doyce Para, RN Outcome: Progressing 04/21/2018 1659 by Donnetta Hutching,  Brodie Scovell, RN Outcome: Progressing Goal: Will verbalize feelings 04/21/2018 1717 by Doyce Para, RN Outcome: Progressing 04/21/2018 1659 by Doyce Para, RN Outcome: Progressing   Problem: Health Behavior/Discharge Planning: Goal: Compliance with prescribed medication regimen will improve 04/21/2018 1717 by Doyce Para, RN Outcome: Progressing 04/21/2018 1659 by Doyce Para, RN Outcome: Progressing   Problem: Nutritional: Goal: Ability to achieve adequate nutritional intake will improve 04/21/2018 1717 by Doyce Para, RN Outcome: Progressing 04/21/2018 1659 by Doyce Para, RN Outcome: Progressing   Problem: Role Relationship: Goal: Ability to communicate needs accurately will improve 04/21/2018 1717 by Doyce Para, RN Outcome: Progressing 04/21/2018 1659 by Doyce Para, RN Outcome: Progressing Goal: Ability to interact with others will improve 04/21/2018 1717 by Doyce Para, RN Outcome: Progressing 04/21/2018 1659 by Doyce Para, RN Outcome: Progressing   Problem: Safety: Goal: Ability to redirect hostility and anger into socially appropriate behaviors will improve 04/21/2018 1717 by Doyce Para, RN Outcome: Progressing 04/21/2018 1659 by Doyce Para, RN Outcome: Progressing Goal: Ability to remain free from injury will improve 04/21/2018 1717 by Doyce Para, RN Outcome: Progressing 04/21/2018 1659 by Doyce Para,  RN Outcome: Progressing   Problem: Self-Care: Goal: Ability to participate in self-care as condition permits will improve 04/21/2018 1717 by Doyce Para, RN Outcome: Progressing 04/21/2018 1659 by Doyce Para, RN Outcome: Progressing   Problem: Self-Concept: Goal: Will verbalize positive feelings about self 04/21/2018 1717 by Doyce Para, RN Outcome: Progressing 04/21/2018 1659 by Doyce Para, RN Outcome: Progressing   Problem: Safety: Goal: Ability to remain free from injury will improve 04/21/2018 1717 by Doyce Para, RN Outcome: Progressing 04/21/2018 1659 by Doyce Para, RN Outcome: Progressing

## 2018-04-21 NOTE — [place] FL2 (Signed)
[place] MEDICAID FL2 LEVEL OF CARE SCREENING TOOL     IDENTIFICATION  Patient Name: David Davila. Birthdate: Aug 07, 1981 Sex: male Admission Date (Current Location): 03/26/2018  [place] and [place] Number:  Teacher, English as a foreign language Facility and Address:  [facility], [address], [place], [place] [address]      Provider Number: [id number]  Attending Physician Name and Address:  Haskell Riling, MD  Relative Name and Phone Number:  Concepcion Elk is legal guardian [phone] [employer]     Current Level of Care: Hospital Recommended Level of Care: Other (Comment), Family Care Home(Group Home) Prior Approval Number:    Date Approved/Denied:   PASRR Number:    Discharge Plan: Domiciliary (Rest home)    Current Diagnoses: Patient Active Problem List   Diagnosis Date Noted  . 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 TBI (traumatic brain injury) (HCC) 07/10/2016  . Vitamin B12 deficiency 07/10/2016  . Noncompliance with medication regimen 06/25/2016  . Cocaine use disorder, mild, abuse (HCC) 06/25/2016  . Cannabis use disorder, mild, abuse 06/25/2016  . Paranoid schizophrenia (HCC) 06/24/2016    Orientation RESPIRATION BLADDER Height & Weight     Self, Time, Situation, Place  Normal Continent Weight: 128 lb (58.1 kg) Height:  5\' 8"  (172.7 cm)  BEHAVIORAL SYMPTOMS/MOOD NEUROLOGICAL BOWEL NUTRITION STATUS  Other (Comment)(Severe agitation, aggression and disorganization when off of his medications) (None) Continent Diet  AMBULATORY STATUS COMMUNICATION OF NEEDS Skin   Independent Verbally Normal                       Personal Care Assistance Level of Assistance  Bathing, Feeding, Dressing, Total care Bathing Assistance: Independent Feeding assistance: Independent Dressing Assistance: Independent Total Care Assistance: Independent   Functional Limitations Info  Sight, Hearing, Speech Sight Info: Adequate Hearing  Info: Adequate Speech Info: Adequate    SPECIAL CARE FACTORS FREQUENCY  (N/A)                    Contractures Contractures Info: Not present    Additional Factors Info  Code Status, Psychotropic Code Status Info: Full Code   Psychotropic Info: Psycotropic Medications         Current Medications (04/21/2018):  This is the current hospital active medication list Current Facility-Administered Medications  Medication Dose Route Frequency Provider Last Rate Last Dose  . acetaminophen (TYLENOL) tablet 650 mg  650 mg Oral Q6H PRN Clapacs, John T, MD      . alum & mag hydroxide-simeth (MAALOX/MYLANTA) 200-200-20 MG/5ML suspension 30 mL  30 mL Oral Q4H PRN Clapacs, John T, MD      . benztropine (COGENTIN) tablet 1 mg  1 mg Oral BID PRN McNew, Ileene Hutchinson, MD   1 mg at 04/08/18 2048   Or  . benztropine mesylate (COGENTIN) injection 1 mg  1 mg Intramuscular BID PRN McNew, Ileene Hutchinson, MD      . benztropine (COGENTIN) tablet 1 mg  1 mg Oral QHS McNew, Ileene Hutchinson, MD   1 mg at 04/20/18 2128  . divalproex (DEPAKOTE) DR tablet 1,000 mg  1,000 mg Oral BH-q7a McNew, Holly R, MD   1,000 mg at 04/21/18 0630   And  . divalproex (DEPAKOTE) DR tablet 750 mg  750 mg Oral QHS McNew, Ileene Hutchinson, MD   750 mg at 04/20/18 2128  . haloperidol (HALDOL) tablet 5 mg  5 mg Oral Q6H PRN McNew, Ileene Hutchinson, MD  Or  . haloperidol lactate (HALDOL) injection 5 mg  5 mg Intramuscular Q6H PRN McNew, Ileene Hutchinson, MD   5 mg at 04/01/18 0819  . haloperidol (HALDOL) tablet 5 mg  5 mg Oral QHS McNew, Ileene Hutchinson, MD   5 mg at 04/20/18 2127  . hydrOXYzine (ATARAX/VISTARIL) tablet 50 mg  50 mg Oral TID PRN Clapacs, Jackquline Denmark, MD   50 mg at 04/16/18 2134  . LORazepam (ATIVAN) tablet 1 mg  1 mg Oral Q6H PRN Haskell Riling, MD   1 mg at 04/16/18 2134   Or  . LORazepam (ATIVAN) injection 1 mg  1 mg Intramuscular Q6H PRN Haskell Riling, MD   1 mg at 04/01/18 0820  . magnesium hydroxide (MILK OF MAGNESIA) suspension 30 mL  30 mL Oral Daily PRN  Clapacs, John T, MD      . traZODone (DESYREL) tablet 100 mg  100 mg Oral QHS PRN Clapacs, Jackquline Denmark, MD   100 mg at 04/19/18 2156     Discharge Medications: Please see discharge summary for a list of discharge medications.  Relevant Imaging Results:  Relevant Lab Results:   Additional Information TBI (traumatic brain injury) , Patient is being referred to an ACTT Team that can work with him for better community support services. Patient has been compliant with his medications while in the hospital and has not presented with any aggressive behavior.  Johny Shears, LCSW

## 2018-04-21 NOTE — BHH Counselor (Signed)
CSW sent over requested information (PSA) on the patient for [facility] to review for his ACTT referral.   Johny Shears, MSW, Bryon Lions Clinical Social Worker 04/21/2018 10:02 AM

## 2018-04-21 NOTE — BHH Counselor (Signed)
  CSW called the following places to inquire about bed placement for the patient. . [facility] (6) Mary E. Pulliam [address]; [place], [place] [address] [phone] Fax: [phone]. - They have male beds available. CSW will send over FL2 to be reviewed. . [facility] (6) [facility] [address]; [place], [place] [address] [phone] Fax: [phone] CSW left a voicemail with a callback number.  . [facility] (6) [facility] [address]; [place], [place] [address] [phone] Fax: [phone]- Patricia Cromwell. They have male beds available. CSW will send over FL2 to be reviewed. . [facility] (6) [facility] [address]; [place], [place] [address] [phone] Fax: [phone]. Mr. Clement. Only takes patients 45+.  . [facility] (6) [facility] [address]; [place], [place] [address] [phone] Fax: [phone]. - They have male beds available. CSW will send over FL2 to be reviewed. . [facility] (6) Diane C. Lucas [address]; [place], [place] [address] [phone] Fax: [phone]- CSW unable to leave a voicemail.  . [facility] (4) [facility] [address]; [place], [place] [address] [phone] Fax:[phone]- Glenda. - They have male beds available. CSW will send over FL2 to be reviewed. . [facility] (5) [facility] [address]; [place], [place] [address] [phone] Fax: [phone]- No males available. . [facility]) [facility] [address]; [place], [place] [address] [phone] Fax: [phone]- Amma Allen. They have beds available. -They report that it doesn't sound like they are a good fit. She will review FL2. . [facility] (6) [facility] [address]; [place],   [address] [phone] Fax: [phone]- CSW unable to leave a voicemail. 

## 2018-04-21 NOTE — NC FL2 (Signed)
[place] MEDICAID FL2 LEVEL OF CARE SCREENING TOOL     IDENTIFICATION  Patient Name: David Davila. Birthdate: 01-01-1981 Sex: male Admission Date (Current Location): 03/26/2018  [place] and [place] Number:  Teacher, English as a foreign language Facility and Address:  [facility], [address] [place], [place] [address]      Provider Number: [id number]  Attending Physician Name and Address:  Haskell Riling, MD  Relative Name and Phone Number:  Concepcion Elk is legal guardian [phone] [employer] [place]     Current Level of Care: Hospital Recommended Level of Care: Other (Comment), Family Care Home(Group Home) Prior Approval Number:    Date Approved/Denied:   PASRR Number:    Discharge Plan: Domiciliary (Rest home)    Current Diagnoses: Patient Active Problem List   Diagnosis Date Noted  . 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 TBI (traumatic brain injury) (HCC) 07/10/2016  . Vitamin B12 deficiency 07/10/2016  . Noncompliance with medication regimen 06/25/2016  . Cocaine use disorder, mild, abuse (HCC) 06/25/2016  . Cannabis use disorder, mild, abuse 06/25/2016  . Paranoid schizophrenia (HCC) 06/24/2016    Orientation RESPIRATION BLADDER Height & Weight     Self, Time, Situation, Place  Normal Continent Weight: 128 lb (58.1 kg) Height:  5\' 8"  (172.7 cm)  BEHAVIORAL SYMPTOMS/MOOD NEUROLOGICAL BOWEL NUTRITION STATUS  Other (Comment)(Severe agitation, aggression and disorganization when off of his medications) (None) Continent Diet  AMBULATORY STATUS COMMUNICATION OF NEEDS Skin   Independent Verbally Normal                       Personal Care Assistance Level of Assistance  Bathing, Feeding, Dressing, Total care Bathing Assistance: Independent Feeding assistance: Independent Dressing Assistance: Independent Total Care Assistance: Independent   Functional Limitations Info  Sight, Hearing, Speech Sight Info: Adequate Hearing  Info: Adequate Speech Info: Adequate    SPECIAL CARE FACTORS FREQUENCY  (N/A)                    Contractures Contractures Info: Not present    Additional Factors Info  Code Status, Psychotropic Code Status Info: Full Code   Psychotropic Info: Psycotropic Medications         Current Medications (04/21/2018):  This is the current hospital active medication list Current Facility-Administered Medications  Medication Dose Route Frequency Provider Last Rate Last Dose  . acetaminophen (TYLENOL) tablet 650 mg  650 mg Oral Q6H PRN Clapacs, John T, MD      . alum & mag hydroxide-simeth (MAALOX/MYLANTA) 200-200-20 MG/5ML suspension 30 mL  30 mL Oral Q4H PRN Clapacs, John T, MD      . benztropine (COGENTIN) tablet 1 mg  1 mg Oral BID PRN McNew, Ileene Hutchinson, MD   1 mg at 04/08/18 2048   Or  . benztropine mesylate (COGENTIN) injection 1 mg  1 mg Intramuscular BID PRN McNew, Ileene Hutchinson, MD      . benztropine (COGENTIN) tablet 1 mg  1 mg Oral QHS McNew, Ileene Hutchinson, MD   1 mg at 04/20/18 2128  . divalproex (DEPAKOTE) DR tablet 1,000 mg  1,000 mg Oral BH-q7a McNew, Holly R, MD   1,000 mg at 04/21/18 0630   And  . divalproex (DEPAKOTE) DR tablet 750 mg  750 mg Oral QHS McNew, Ileene Hutchinson, MD   750 mg at 04/20/18 2128  . haloperidol (HALDOL) tablet 5 mg  5 mg Oral Q6H PRN McNew, Ileene Hutchinson, MD  Or  . haloperidol lactate (HALDOL) injection 5 mg  5 mg Intramuscular Q6H PRN McNew, Ileene Hutchinson, MD   5 mg at 04/01/18 0819  . haloperidol (HALDOL) tablet 5 mg  5 mg Oral QHS McNew, Ileene Hutchinson, MD   5 mg at 04/20/18 2127  . hydrOXYzine (ATARAX/VISTARIL) tablet 50 mg  50 mg Oral TID PRN Clapacs, Jackquline Denmark, MD   50 mg at 04/16/18 2134  . LORazepam (ATIVAN) tablet 1 mg  1 mg Oral Q6H PRN Haskell Riling, MD   1 mg at 04/16/18 2134   Or  . LORazepam (ATIVAN) injection 1 mg  1 mg Intramuscular Q6H PRN Haskell Riling, MD   1 mg at 04/01/18 0820  . magnesium hydroxide (MILK OF MAGNESIA) suspension 30 mL  30 mL Oral Daily PRN  Clapacs, John T, MD      . traZODone (DESYREL) tablet 100 mg  100 mg Oral QHS PRN Clapacs, Jackquline Denmark, MD   100 mg at 04/19/18 2156     Discharge Medications: Please see discharge summary for a list of discharge medications.  Relevant Imaging Results:  Relevant Lab Results:   Additional Information TBI (traumatic brain injury), Patient is being referred to an ACTT Team that can work with him for better community support services.  Johny Shears, LCSW

## 2018-04-21 NOTE — Plan of Care (Signed)
  Problem: Education: Goal: Knowledge of [place] [facility] Education information/materials will improve Outcome: Progressing Goal: Emotional status will improve Outcome: Progressing Goal: Mental status will improve Outcome: Progressing Goal: Verbalization of understanding the information provided will improve Outcome: Progressing   Problem: Activity: Goal: Interest or engagement in activities will improve Outcome: Progressing Goal: Sleeping patterns will improve Outcome: Progressing   Problem: Coping: Goal: Ability to verbalize frustrations and anger appropriately will improve Outcome: Progressing Goal: Ability to demonstrate self-control will improve Outcome: Progressing   Problem: Health Behavior/Discharge Planning: Goal: Identification of resources available to assist in meeting health care needs will improve Outcome: Progressing Goal: Compliance with treatment plan for underlying cause of condition will improve Outcome: Progressing   Problem: Physical Regulation: Goal: Ability to maintain clinical measurements within normal limits will improve Outcome: Progressing   Problem: Safety: Goal: Periods of time without injury will increase Outcome: Progressing   Problem: Activity: Goal: Will verbalize the importance of balancing activity with adequate rest periods Outcome: Progressing   Problem: Education: Goal: Will be free of psychotic symptoms Outcome: Progressing Goal: Knowledge of the prescribed therapeutic regimen will improve Outcome: Progressing   Problem: Coping: Goal: Coping ability will improve Outcome: Progressing Goal: Will verbalize feelings Outcome: Progressing   Problem: Health Behavior/Discharge Planning: Goal: Compliance with prescribed medication regimen will improve Outcome: Progressing   Problem: Nutritional: Goal: Ability to achieve adequate nutritional intake will improve Outcome: Progressing   Problem: Role Relationship: Goal:  Ability to communicate needs accurately will improve Outcome: Progressing Goal: Ability to interact with others will improve Outcome: Progressing   Problem: Safety: Goal: Ability to redirect hostility and anger into socially appropriate behaviors will improve Outcome: Progressing Goal: Ability to remain free from injury will improve Outcome: Progressing   Problem: Self-Care: Goal: Ability to participate in self-care as condition permits will improve Outcome: Progressing   Problem: Self-Concept: Goal: Will verbalize positive feelings about self Outcome: Progressing   Problem: Safety: Goal: Ability to remain free from injury will improve Outcome: Progressing   

## 2018-04-21 NOTE — BHH Group Notes (Signed)
04/21/2018 1PM  Type of Therapy/Topic:  Group Therapy:  Feelings about Diagnosis  Participation Level:  Minimal   Description of Group:   This group will allow patients to explore their thoughts and feelings about diagnoses they have received. Patients will be guided to explore their level of understanding and acceptance of these diagnoses. Facilitator will encourage patients to process their thoughts and feelings about the reactions of others to their diagnosis and will guide patients in identifying ways to discuss their diagnosis with significant others in their lives. This group will be process-oriented, with patients participating in exploration of their own experiences, giving and receiving support, and processing challenge from other group members.   Therapeutic Goals: 1. Patient will demonstrate understanding of diagnosis as evidenced by identifying two or more symptoms of the disorder 2. Patient will be able to express two feelings regarding the diagnosis 3. Patient will demonstrate their ability to communicate their needs through discussion and/or role play  Summary of Patient Progress: Actively and appropriately engaged in the group. Patient was able to provide support and validation to other group members.Patient practiced active listening when interacting with the facilitator and other group members. Patient reports having a symptom associated with isolating himself. He did not go into further detail but was attentive to the group topic. Patient is still in the process of obtaining treatment goals.        Therapeutic Modalities:   Cognitive Behavioral Therapy Brief Therapy Feelings Identification    Johny Shears, LCSW 04/21/2018 2:55 PM

## 2018-04-21 NOTE — Progress Notes (Signed)
Recreation Therapy Notes  Date: 04/20/2018  Time: 9:30 pm   Location: Craft Room   Behavioral response: N/A   Intervention Topic: Problem Solving  Discussion/Intervention: Patient did not attend group.   Clinical Observations/Feedback:  Patient did not attend group.   [name] LRT/CTRS        David Davila 04/21/2018 11:27 AM 

## 2018-04-21 NOTE — Progress Notes (Signed)
D- Patient alert and oriented. Patient presents in a pleasant mood on assessment stating that he slept "ok" last night and had no major complaints to voice at this time. Patient states that he feels alright today. Patient denies SI, HI, AVH, and pain at this time. Patient also denies any signs/symptoms of depression/anxiety. Patient's goal for today is to "have a great day".  A- Scheduled medications administered to patient, per MD orders. Support and encouragement provided.  Routine safety checks conducted every 15 minutes.  Patient informed to notify staff with problems or concerns.  R- No adverse drug reactions noted. Patient contracts for safety at this time. Patient compliant with medications and treatment plan. Patient receptive, calm, and cooperative. Patient interacts well with others on the unit.  Patient remains safe at this time.

## 2018-04-21 NOTE — Plan of Care (Signed)
Patient is improving in all areas, attending groups and participating in peer activities ,seen in the milieu socializing with peer friends, patient is engaging and cooperating with his medications, no complains voiced, states feeling fine, denies any SI/HI, and no signs of AVH noted at time, 15 minute safety checks in progress.   Problem: Education: Goal: Knowledge of [place] [facility] Education information/materials will improve Outcome: Progressing Goal: Emotional status will improve Outcome: Progressing Goal: Mental status will improve Outcome: Progressing Goal: Verbalization of understanding the information provided will improve Outcome: Progressing   Problem: Activity: Goal: Interest or engagement in activities will improve Outcome: Progressing Goal: Sleeping patterns will improve Outcome: Progressing   Problem: Coping: Goal: Ability to verbalize frustrations and anger appropriately will improve Outcome: Progressing Goal: Ability to demonstrate self-control will improve Outcome: Progressing   Problem: Health Behavior/Discharge Planning: Goal: Identification of resources available to assist in meeting health care needs will improve Outcome: Progressing Goal: Compliance with treatment plan for underlying cause of condition will improve Outcome: Progressing   Problem: Physical Regulation: Goal: Ability to maintain clinical measurements within normal limits will improve Outcome: Progressing   Problem: Safety: Goal: Periods of time without injury will increase Outcome: Progressing   Problem: Activity: Goal: Will verbalize the importance of balancing activity with adequate rest periods Outcome: Progressing   Problem: Education: Goal: Will be free of psychotic symptoms Outcome: Progressing Goal: Knowledge of the prescribed therapeutic regimen will improve Outcome: Progressing   Problem: Coping: Goal: Coping ability will improve Outcome: Progressing Goal: Will  verbalize feelings Outcome: Progressing   Problem: Health Behavior/Discharge Planning: Goal: Compliance with prescribed medication regimen will improve Outcome: Progressing   Problem: Nutritional: Goal: Ability to achieve adequate nutritional intake will improve Outcome: Progressing   Problem: Role Relationship: Goal: Ability to communicate needs accurately will improve Outcome: Progressing Goal: Ability to interact with others will improve Outcome: Progressing   Problem: Safety: Goal: Ability to redirect hostility and anger into socially appropriate behaviors will improve Outcome: Progressing Goal: Ability to remain free from injury will improve Outcome: Progressing   Problem: Self-Care: Goal: Ability to participate in self-care as condition permits will improve Outcome: Progressing   Problem: Self-Concept: Goal: Will verbalize positive feelings about self Outcome: Progressing   Problem: Safety: Goal: Ability to remain free from injury will improve Outcome: Progressing

## 2018-04-21 NOTE — BHH Group Notes (Signed)
BHH Group Notes:  (Nursing/MHT/Case Management/Adjunct)  Date:  04/21/2018  Time:  10:52 PM  Type of Therapy:  Group Therapy  Participation Level:  Active  Participation Quality:  Appropriate  Affect:  Appropriate  Cognitive:  Appropriate  Insight:  Appropriate  Engagement in Group:  Engaged  Modes of Intervention:  Discussion  Summary of Progress/Problems: Niguel was soft spoken, speaking as if he was whispering. Hameed stated he wanted to stay positive. "That's all I can do." Garon is thinking about where he will go when he is discharged. Kroy stated he needed a place to stay. MHT reviewed rules and expectations of the unit 1. Visitation hours 2. Phone hours 3. No sharing clothes 4. No use of last names, first names only 5. No visiting other patients in rooms 6. Only allowed on hall room is on 7. Cover self appropriately because MHT's will be looking in to check on at night MHT processed with group about after care plans. MHT encouraged patients to attend follow up appointments and take medications as prescribed. MHT informed of resource board with services to address mental health, housing, and substance abuse. MHT encouraged patients to talk with prescribing physicians on the effectiveness of their medications. Jinger Neighbors 04/21/2018, 10:52 PM

## 2018-04-21 NOTE — BHH Counselor (Signed)
CSW called the patients guardian to check in with her regarding the patients current progress with locating a  group home placement. CSW left a voicemail with a callback number.  CSW was informed by [facility] that the patient was denied placement there.  Johny Shears, MSW, Theresia Majors, Bridget Hartshorn Clinical Social Worker 04/21/2018 3:38 PM

## 2018-04-21 NOTE — BHH Counselor (Signed)
CSW called [facility] to follow up on the [facility] that was sent off. She reports that she no longer has a male bed available for the patient. CSW will continue to look for placement.  Johny Shears, MSW, Theresia Majors, Bridget Hartshorn Clinical Social Worker 04/21/2018 10:18 AM

## 2018-04-22 NOTE — Progress Notes (Signed)
Recreation Therapy Notes   Date: 04/22/2018  Time: 9:30 pm   Location: Craft Room   Behavioral response: N/A   Intervention Topic: Stress  Discussion/Intervention: Patient did not attend group.   Clinical Observations/Feedback:  Patient did not attend group.   Zameria Vogl LRT/CTRS         Daved Mcfann 04/22/2018 11:46 AM 

## 2018-04-22 NOTE — BHH Counselor (Signed)
CSW called the following places to follow up on FL2 that was sent yesturday 04/21/2018.   [facility] -CSW CALLED TO FOLLOW UP, LEFT CONTACT NUMBER AND MESSAGE.  [facility] ([facility]) [facility] [employer] -CSW CALLED TO FOLLOW UP, THE NUMBER HAS BEEN DISCONNECTED.  [facility]- DENIED PATIENT.   [facility] (4) [facility] DENIED PATIENT. [facility] -- CSW called again, unable to leave a voicemail.   Johny Shears, MSW, Theresia Majors, Bridget Hartshorn Clinical Social Worker 04/22/2018 3:42 PM

## 2018-04-22 NOTE — Progress Notes (Signed)
D- Patient alert and oriented. Patient presents in a pleasant mood on assessment stating that he is sleeping good at night and has no major complaints to voice to this Clinical research associate. Patient denies SI, HI, AVH, and pain at this time. Patient also denies any signs/symptoms of depression and anxiety stating to this writer "I feel ok". Patient's goal for today is to "work on discharge".  A- Scheduled medications administered to patient, per MD orders. Support and encouragement provided.  Routine safety checks conducted every 15 minutes.  Patient informed to notify staff with problems or concerns.  R- No adverse drug reactions noted. Patient contracts for safety at this time. Patient compliant with medications and treatment plan. Patient receptive, calm, and cooperative. Patient interacts well with others on the unit.  Patient remains safe at this time.

## 2018-04-22 NOTE — Progress Notes (Signed)
Nursing note 7p-7a  Pt observed interacting with peers on unit this shift. Displayed a bright affect and mood upon interaction with this Clinical research associate. Pt denies pain ,denies SI/HI, and also denies any audio or visual hallucinations at this time. Pt complained of insomnia, see MAR for prn medication administration. Pt is able to verbally contract for safety with this RN. Goal:  " Have a good day" Pt is now resting in bed with eyes closed, with no signs or symptoms of pain or distress noted. Pt continues to remain safe on the unit and is observed by rounding every 15 min. RN will continue to monitor.

## 2018-04-22 NOTE — Progress Notes (Signed)
[facility] MD Progress Note  04/22/2018 2:27 PM David Davila.  MRN:  [id number] Subjective:  Pt is in pleasant mood. NO evidence of irritability. HE is organized and goal directed. He is attending some groups. He remembers our discussion yesterday and sates, "We talked about my guardian coming to talk to me tomorrow." He agrees to getting some blood work done tomorrow. He denies SI, HI, AH, VH. He is oriented.   Principal Problem: Undifferentiated schizophrenia (HCC) Diagnosis:   Patient Active Problem List   Diagnosis Date Noted  . Undifferentiated schizophrenia (HCC) [F20.3] 03/26/2018    Priority: High  . TBI (traumatic brain injury) (HCC) [S06.9X9A] 07/10/2016  . Vitamin B12 deficiency [E53.8] 07/10/2016  . Noncompliance with medication regimen [Z91.14] 06/25/2016  . Cocaine use disorder, mild, abuse (HCC) [F14.10] 06/25/2016  . Cannabis use disorder, mild, abuse [F12.10] 06/25/2016  . 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 Acute psychosis (HCC)   . Antisocial personality disorder (HCC)   . Cannabis abuse    Past  . Paranoid schizophrenia (HCC)   . Schizoaffective disorder, bipolar type (HCC)    History reviewed. No pertinent surgical history. Family History: History reviewed. No pertinent family history. Family Psychiatric  History: See H&P Social History:  Social History   Substance and Sexual Activity  Alcohol Use No     Social History   Substance and Sexual Activity  Drug Use Not Currently    Social History   Socioeconomic History  . Marital status: Single    Spouse name: Not on file  . Number of children: Not on file  . Years of education: Not on file  . Highest education level: Not on file  Occupational History  . Not on file  Social Needs  . Financial resource strain: Not on file  . Food  insecurity:    Worry: Not on file    Inability: Not on file  . Transportation needs:    Medical: Not on file    Non-medical: Not on file  Tobacco Use  . Smoking status: Former Smoker    Packs/day: 1.00    Types: Cigarettes  . Smokeless tobacco: Never Used  Substance and Sexual Activity  . Alcohol use: No  . Drug use: Not Currently  . Sexual activity: Not Currently  Lifestyle  . Physical activity:    Days per week: Not on file    Minutes per session: Not on file  . Stress: Not on file  Relationships  . Social connections:    Talks on phone: Not on file    Gets together: Not on file    Attends religious service: Not on file    Active member of club or organization: Not on file    Attends meetings of clubs or organizations: Not on file    Relationship status: Not on file  Other Topics Concern  . Not on file  Social History Narrative  . Not on file   Additional Social History:                         Sleep: Good  Appetite:  Good  Current Medications: Current Facility-Administered Medications  Medication Dose Route Frequency Provider Last Rate Last Dose  . acetaminophen (TYLENOL) tablet 650 mg  650 mg Oral Q6H PRN Clapacs, Jackquline Denmark, MD      .  alum & mag hydroxide-simeth (MAALOX/MYLANTA) 200-200-20 MG/5ML suspension 30 mL  30 mL Oral Q4H PRN Clapacs, John T, MD      . benztropine (COGENTIN) tablet 1 mg  1 mg Oral BID PRN Belkis Norbeck, Ileene Hutchinson, MD   1 mg at 04/08/18 2048   Or  . benztropine mesylate (COGENTIN) injection 1 mg  1 mg Intramuscular BID PRN Nasser Ku, Ileene Hutchinson, MD      . benztropine (COGENTIN) tablet 1 mg  1 mg Oral QHS Kania Regnier, Ileene Hutchinson, MD   1 mg at 04/21/18 2125  . divalproex (DEPAKOTE) DR tablet 1,000 mg  1,000 mg Oral BH-q7a Flynn Lininger, Ileene Hutchinson, MD   1,000 mg at 04/22/18 1443   And  . divalproex (DEPAKOTE) DR tablet 750 mg  750 mg Oral QHS Dashayla Theissen R, MD   750 mg at 04/21/18 2125  . haloperidol (HALDOL) tablet 5 mg  5 mg Oral Q6H PRN Caileen Veracruz, Ileene Hutchinson, MD       Or   . haloperidol lactate (HALDOL) injection 5 mg  5 mg Intramuscular Q6H PRN Jamika Sadek, Ileene Hutchinson, MD   5 mg at 04/01/18 0819  . haloperidol (HALDOL) tablet 5 mg  5 mg Oral QHS Mihail Prettyman R, MD   5 mg at 04/21/18 2125  . hydrOXYzine (ATARAX/VISTARIL) tablet 50 mg  50 mg Oral TID PRN Clapacs, Jackquline Denmark, MD   50 mg at 04/16/18 2134  . LORazepam (ATIVAN) tablet 1 mg  1 mg Oral Q6H PRN Haskell Riling, MD   1 mg at 04/16/18 2134   Or  . LORazepam (ATIVAN) injection 1 mg  1 mg Intramuscular Q6H PRN Haskell Riling, MD   1 mg at 04/01/18 0820  . magnesium hydroxide (MILK OF MAGNESIA) suspension 30 mL  30 mL Oral Daily PRN Clapacs, John T, MD      . traZODone (DESYREL) tablet 100 mg  100 mg Oral QHS PRN Clapacs, Jackquline Denmark, MD   100 mg at 04/21/18 2126    Lab Results: No results found for this or any previous visit (from the past 48 hour(s)).  Blood Alcohol level:  Lab Results  Component Value Date   ETH <10 03/25/2018   ETH <10 12/06/2017    Metabolic Disorder Labs: Lab Results  Component Value Date   HGBA1C 5.4 03/25/2018   MPG 108.28 03/25/2018   No results found for: PROLACTIN Lab Results  Component Value Date   CHOL 184 03/25/2018   TRIG 43 03/25/2018   HDL 50 03/25/2018   CHOLHDL 3.7 03/25/2018   VLDL 9 03/25/2018   LDLCALC 125 (H) 03/25/2018    Physical Findings: AIMS: Facial and Oral Movements Muscles of Facial Expression: None, normal Lips and Perioral Area: None, normal Jaw: None, normal Tongue: None, normal,Extremity Movements Upper (arms, wrists, hands, fingers): None, normal Lower (legs, knees, ankles, toes): None, normal, Trunk Movements Neck, shoulders, hips: None, normal, Overall Severity Severity of abnormal movements (highest score from questions above): None, normal Incapacitation due to abnormal movements: None, normal Patient's awareness of abnormal movements (rate only patient's report): No Awareness, Dental Status Current problems with teeth and/or dentures?:  No Does patient usually wear dentures?: No  CIWA:  CIWA-Ar Total: 0 COWS:  COWS Total Score: 0  Musculoskeletal: Strength & Muscle Tone: within normal limits Gait & Station: normal Patient leans: N/A  Psychiatric Specialty Exam: Physical Exam  Nursing note and vitals reviewed.   Review of Systems  All other systems reviewed and are negative.  Blood pressure 93/64, pulse 66, temperature 98.4 F (36.9 C), temperature source Oral, resp. rate 18, height 5\' 8"  (1.727 m), weight 58.1 kg, SpO2 98 %.Body mass index is 19.46 kg/m.  General Appearance: Disheveled  Eye Contact:  Minimal  Speech:  Clear and Coherent  Volume:  Normal  Mood:  Euthymic  Affect:  Appropriate  Thought Process:  Coherent and Goal Directed  Orientation:  Full (Time, Place, and Person)  Thought Content:  Logical  Suicidal Thoughts:  No  Homicidal Thoughts:  No  Memory:  Immediate;   Fair  Judgement:  Impaired  Insight:  Lacking  Psychomotor Activity:  Normal  Concentration:  Concentration: Fair  Recall:  [employer] of Knowledge:  Fair  Language:  Fair  Akathisia:  No      Assets:  Resilience  ADL's:  Intact  Cognition:  WNL  Sleep:  Number of Hours: 5.45     Treatment Plan Summary: 37 yo male admitted due to decompensation of psychosis. HE has been very calm and polite on the unit. No episodes of aggression.   Plan:  Schizoaffective disorder -Next [place] injection due on 04/30/18 -Will check CBP to monitor ANC  -Continue Depakote 1000 mg qam and 750 mg qhs. Check level tomorrow -Continue Haldol 5 mg qhs  dispo -Waiting placement to group home. Will meet with guardian for treatment team on Friday  Haskell Riling, MD 04/22/2018, 2:27 PM

## 2018-04-22 NOTE — Plan of Care (Signed)
Patient has verbalized understanding of his prescribed therapeutic regimen as well as the general information that's been provided to him and all questions and concerns have been addressed and answered at this time. Patient denies SI/HI/AVH as well as any signs/symptoms of depression and anxiety stating to this writer "I feel ok". Patient has been sleeping more today than yesterday, he has not been out in the milieu much. Patient has the ability to verbalize his anger and frustrations into appropriate behaviors and has been demonstrating self-control on the unit. Patient has the ability to identify the available resources that can assist him in meeting his health- care needs, however, he has not voiced anything to this Clinical research associate as of yet.  Patient has been working towards maintaining his clinical measurements within normal limits. Patient has been present for meals and has achieved adequate nutrition. Patient participates in self-care when best suits him. Patient has been free from injury thus far and remains safe on this unit at this time.  Problem: Education: Goal: Knowledge of [place] [facility] Education information/materials will improve Outcome: Progressing Goal: Emotional status will improve Outcome: Progressing Goal: Mental status will improve Outcome: Progressing Goal: Verbalization of understanding the information provided will improve Outcome: Progressing   Problem: Activity: Goal: Interest or engagement in activities will improve Outcome: Progressing Goal: Sleeping patterns will improve Outcome: Progressing   Problem: Coping: Goal: Ability to verbalize frustrations and anger appropriately will improve Outcome: Progressing Goal: Ability to demonstrate self-control will improve Outcome: Progressing   Problem: Health Behavior/Discharge Planning: Goal: Identification of resources available to assist in meeting health care needs will improve Outcome: Progressing Goal: Compliance with  treatment plan for underlying cause of condition will improve Outcome: Progressing   Problem: Physical Regulation: Goal: Ability to maintain clinical measurements within normal limits will improve Outcome: Progressing   Problem: Safety: Goal: Periods of time without injury will increase Outcome: Progressing   Problem: Activity: Goal: Will verbalize the importance of balancing activity with adequate rest periods Outcome: Progressing   Problem: Education: Goal: Will be free of psychotic symptoms Outcome: Progressing Goal: Knowledge of the prescribed therapeutic regimen will improve Outcome: Progressing   Problem: Coping: Goal: Coping ability will improve Outcome: Progressing Goal: Will verbalize feelings Outcome: Progressing   Problem: Health Behavior/Discharge Planning: Goal: Compliance with prescribed medication regimen will improve Outcome: Progressing   Problem: Nutritional: Goal: Ability to achieve adequate nutritional intake will improve Outcome: Progressing   Problem: Role Relationship: Goal: Ability to communicate needs accurately will improve Outcome: Progressing Goal: Ability to interact with others will improve Outcome: Progressing   Problem: Safety: Goal: Ability to redirect hostility and anger into socially appropriate behaviors will improve Outcome: Progressing Goal: Ability to remain free from injury will improve Outcome: Progressing   Problem: Self-Care: Goal: Ability to participate in self-care as condition permits will improve Outcome: Progressing   Problem: Self-Concept: Goal: Will verbalize positive feelings about self Outcome: Progressing   Problem: Safety: Goal: Ability to remain free from injury will improve Outcome: Progressing

## 2018-04-23 NOTE — [facility] Group Notes (Signed)
LCSW Group Therapy Note  04/23/2018 1:00 pm  Type of Therapy/Topic:  Group Therapy:  Balance in Life  Participation Level:  Did Not Attend  Description of Group:    This group will address the concept of balance and how it feels and looks when one is unbalanced. Patients will be encouraged to process areas in their lives that are out of balance and identify reasons for remaining unbalanced. Facilitators will guide patients in utilizing problem-solving interventions to address and correct the stressor making their life unbalanced. Understanding and applying boundaries will be explored and addressed for obtaining and maintaining a balanced life. Patients will be encouraged to explore ways to assertively make their unbalanced needs known to significant others in their lives, using other group members and facilitator for support and feedback.  Therapeutic Goals: 1. Patient will identify two or more emotions or situations they have that consume much of in their lives. 2. Patient will identify signs/triggers that life has become out of balance:  3. Patient will identify two ways to set boundaries in order to achieve balance in their lives:  4. Patient will demonstrate ability to communicate their needs through discussion and/or role plays  Summary of Patient Progress:      Therapeutic Modalities:   Cognitive Behavioral Therapy Solution-Focused Therapy Assertiveness Training  Scott Vanderveer S Vaness Jelinski, LCSW 04/23/2018 2:32 PM  

## 2018-04-23 NOTE — Progress Notes (Signed)
Patient stayed around until bedtime. Calm and cooperative. Denied SI/HI. Denied hallucinations. Attended group, had a snack then presented to the medication room to receive his bedtime medications. Had no concerns. Patient went to his room and currently in bed. No sign of distress. Safety precautions maintained.

## 2018-04-23 NOTE — Plan of Care (Signed)
Stayed in the milieu until bedtime. Compliant with medications

## 2018-04-23 NOTE — Progress Notes (Signed)
[facility] MD Progress Note  04/23/2018 11:47 AM David Davila.  MRN:  [id number] Subjective:  Pt is mostly isolative to his room. HE denies any complaints today. He states, "I'm doing alright." He denies SI, HI, AH, VH. He has been calm and appropriate. HE agrees to meet with his guarding tomorrow.   Principal Problem: Undifferentiated schizophrenia (HCC) Diagnosis:   Patient Active Problem List   Diagnosis Date Noted  . Undifferentiated schizophrenia (HCC) [F20.3] 03/26/2018    Priority: High  . TBI (traumatic brain injury) (HCC) [S06.9X9A] 07/10/2016  . Vitamin B12 deficiency [E53.8] 07/10/2016  . Noncompliance with medication regimen [Z91.14] 06/25/2016  . Cocaine use disorder, mild, abuse (HCC) [F14.10] 06/25/2016  . Cannabis use disorder, mild, abuse [F12.10] 06/25/2016  . 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 Acute psychosis (HCC)   . Antisocial personality disorder (HCC)   . Cannabis abuse    Past  . Paranoid schizophrenia (HCC)   . Schizoaffective disorder, bipolar type (HCC)    History reviewed. No pertinent surgical history. Family History: History reviewed. No pertinent family history. Family Psychiatric  History: See H&P Social History:  Social History   Substance and Sexual Activity  Alcohol Use No     Social History   Substance and Sexual Activity  Drug Use Not Currently    Social History   Socioeconomic History  . Marital status: Single    Spouse name: Not on file  . Number of children: Not on file  . Years of education: Not on file  . Highest education level: Not on file  Occupational History  . Not on file  Social Needs  . Financial resource strain: Not on file  . Food insecurity:    Worry: Not on file    Inability: Not on file  . Transportation needs:    Medical: Not on file     Non-medical: Not on file  Tobacco Use  . Smoking status: Former Smoker    Packs/day: 1.00    Types: Cigarettes  . Smokeless tobacco: Never Used  Substance and Sexual Activity  . Alcohol use: No  . Drug use: Not Currently  . Sexual activity: Not Currently  Lifestyle  . Physical activity:    Days per week: Not on file    Minutes per session: Not on file  . Stress: Not on file  Relationships  . Social connections:    Talks on phone: Not on file    Gets together: Not on file    Attends religious service: Not on file    Active member of club or organization: Not on file    Attends meetings of clubs or organizations: Not on file    Relationship status: Not on file  Other Topics Concern  . Not on file  Social History Narrative  . Not on file   Additional Social History:                         Sleep: Good  Appetite:  Good  Current Medications: Current Facility-Administered Medications  Medication Dose Route Frequency Provider Last Rate Last Dose  . acetaminophen (TYLENOL) tablet 650 mg  650 mg Oral Q6H PRN Clapacs, John T, MD      . alum & mag hydroxide-simeth (MAALOX/MYLANTA) 200-200-20 MG/5ML suspension 30 mL  30 mL Oral  Q4H PRN Clapacs, Jackquline DenmarkJohn T, MD      . benztropine (COGENTIN) tablet 1 mg  1 mg Oral BID PRN Ayala Ribble, Ileene HutchinsonHolly R, MD   1 mg at 04/08/18 2048   Or  . benztropine mesylate (COGENTIN) injection 1 mg  1 mg Intramuscular BID PRN Dakia Schifano, Ileene HutchinsonHolly R, MD      . benztropine (COGENTIN) tablet 1 mg  1 mg Oral QHS Beverly Suriano, Ileene HutchinsonHolly R, MD   1 mg at 04/22/18 2146  . divalproex (DEPAKOTE) DR tablet 1,000 mg  1,000 mg Oral BH-q7a Lisaanne Lawrie, Ileene HutchinsonHolly R, MD   1,000 mg at 04/23/18 [id number]   And  . divalproex (DEPAKOTE) DR tablet 750 mg  750 mg Oral QHS Jonothan Heberle R, MD   750 mg at 04/22/18 2145  . haloperidol (HALDOL) tablet 5 mg  5 mg Oral Q6H PRN Euel Castile, Ileene HutchinsonHolly R, MD       Or  . haloperidol lactate (HALDOL) injection 5 mg  5 mg Intramuscular Q6H PRN Kaydin Labo, Ileene HutchinsonHolly R, MD   5 mg at 04/01/18  0819  . haloperidol (HALDOL) tablet 5 mg  5 mg Oral QHS Chee Dimon, Ileene HutchinsonHolly R, MD   5 mg at 04/22/18 2146  . hydrOXYzine (ATARAX/VISTARIL) tablet 50 mg  50 mg Oral TID PRN Clapacs, Jackquline DenmarkJohn T, MD   50 mg at 04/16/18 2134  . LORazepam (ATIVAN) tablet 1 mg  1 mg Oral Q6H PRN Haskell RilingMcNew, Mendy Chou R, MD   1 mg at 04/16/18 2134   Or  . LORazepam (ATIVAN) injection 1 mg  1 mg Intramuscular Q6H PRN Haskell RilingMcNew, Anica Alcaraz R, MD   1 mg at 04/01/18 0820  . magnesium hydroxide (MILK OF MAGNESIA) suspension 30 mL  30 mL Oral Daily PRN Clapacs, John T, MD      . traZODone (DESYREL) tablet 100 mg  100 mg Oral QHS PRN Clapacs, Jackquline DenmarkJohn T, MD   100 mg at 04/21/18 2126    Lab Results: No results found for this or any previous visit (from the past 48 hour(s)).  Blood Alcohol level:  Lab Results  Component Value Date   ETH <10 03/25/2018   ETH <10 12/06/2017    Metabolic Disorder Labs: Lab Results  Component Value Date   HGBA1C 5.4 03/25/2018   MPG 108.28 03/25/2018   No results found for: PROLACTIN Lab Results  Component Value Date   CHOL 184 03/25/2018   TRIG 43 03/25/2018   HDL 50 03/25/2018   CHOLHDL 3.7 03/25/2018   VLDL 9 03/25/2018   LDLCALC 125 (H) 03/25/2018    Physical Findings: AIMS: Facial and Oral Movements Muscles of Facial Expression: None, normal Lips and Perioral Area: None, normal Jaw: None, normal Tongue: None, normal,Extremity Movements Upper (arms, wrists, hands, fingers): None, normal Lower (legs, knees, ankles, toes): None, normal, Trunk Movements Neck, shoulders, hips: None, normal, Overall Severity Severity of abnormal movements (highest score from questions above): None, normal Incapacitation due to abnormal movements: None, normal Patient's awareness of abnormal movements (rate only patient's report): No Awareness, Dental Status Current problems with teeth and/or dentures?: No Does patient usually wear dentures?: No  CIWA:  CIWA-Ar Total: 0 COWS:  COWS Total Score:  0  Musculoskeletal: Strength & Muscle Tone: within normal limits Gait & Station: normal Patient leans: N/A  Psychiatric Specialty Exam: Physical Exam  Nursing note and vitals reviewed.   Review of Systems  All other systems reviewed and are negative.   Blood pressure 93/64, pulse 66, temperature 98.4 F (36.9 C), temperature source Oral,  resp. rate 18, height 5\' 8"  (1.727 m), weight 58.1 kg, SpO2 98 %.Body mass index is 19.46 kg/m.  General Appearance: Disheveled  Eye Contact:  Good  Speech:  Clear and Coherent  Volume:  Normal  Mood:  Euthymic  Affect:  Appropriate  Thought Process:  Coherent and Goal Directed  Orientation:  Full (Time, Place, and Person)  Thought Content:  Logical  Suicidal Thoughts:  No  Homicidal Thoughts:  No  Memory:  Recent;   Fair  Judgement:  Fair  Insight:  Lacking  Psychomotor Activity:  Normal  Concentration:  Concentration: Fair  Recall:  [employer] of Knowledge:  Fair  Language:  Fair  Akathisia:  No      Assets:  Resilience  ADL's:  Intact  Cognition:  Normal  Sleep:  Number of Hours: 7     Treatment Plan Summary: 37 yo male admitted due to decompensation of psychosis. HE has been calm on the unit. We will meet together for treatment team with his guardian tomorrow.   Plan:  Schizoaffective disorder -Next [place]ve[place] sustenna injection due on 9/19 -CBC not drawn yet -Continue Depakote 1000 mg qam and 750 mg qhs -Continue Haldol 5 mg qhs  Dispo -Waiting placement at group home. Will meet with guardian tomorrow at [address] MD 04/23/2018, 11:47 AM

## 2018-04-23 NOTE — Progress Notes (Signed)
Patient ID: David Mulleraul Stephenson Pfalzgraf Jr., male   DOB: 12/20/1980, 37 y.o.   MRN: [id number] PER STATE REGULATIONS 482.30  THIS CHART WAS REVIEWED FOR MEDICAL NECESSITY WITH RESPECT TO THE PATIENT'S ADMISSION/ DURATION OF STAY.  NEXT REVIEW DATE: 04/27/2018  Willa RoughJENNIFER JONES Jahzier Villalon, RN, BSN CASE MANAGER
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
## 2018-04-23 NOTE — Plan of Care (Signed)
Patient is alert and oriented X 3; denies SI, HI and AVH. Patient continues to isolate to room only coming out for meals. Patient does go outside but does not participate in group sessions. Patient takes medication; affect is very flat but pleasant; patient is logical and coherent when speaking and in thought. Patient rates pain 0/10. Patient has no complaints at this time he wants to address. Nurse will continue to monitor. Problem: Education: Goal: Knowledge of [place] [facility] Education information/materials will improve Outcome: Progressing Goal: Emotional status will improve Outcome: Progressing Goal: Mental status will improve Outcome: Progressing Goal: Verbalization of understanding the information provided will improve Outcome: Progressing   Problem: Activity: Goal: Interest or engagement in activities will improve Outcome: Not Progressing Goal: Sleeping patterns will improve Outcome: Progressing   Problem: Coping: Goal: Ability to verbalize frustrations and anger appropriately will improve Outcome: Progressing Goal: Ability to demonstrate self-control will improve Outcome: Progressing

## 2018-04-23 NOTE — BHH Counselor (Signed)
CSW called the following places to inquire about male bed placement for the patient: . [facility] (4) Baltazar ApoBrenda Lee Torain [address]; [place], [place] [address] [phone] [id number] [facility] [place] [facility] - CSW called. Number kept ringing. Unable to leave a voicemail. . [facility] (5) Tammie D. Sharma Covertorman [address]; [place], [place] [phone] [id number] [facility] [place]- CSW called and left a voicemail. . [facility] (6) Timmy Aundria RudRogers and Jan Simpson Rogers [address]; [place], [place] [address] [phone] [id number] [facility] [place] [facility]-No male beds avalible . [facility] (4) [facility] [address]; [place], [place] [address] [phone] MHL-[id number] [facility] [place] [facility]- CSW called and left a voicemail. [name]. [address] [facility] (6) [address] [facility] [place] [address]; [place], [place] [address] [phone] MHL-[id number] [id number] [facility] [place] Adult- Number disconnected . [facility] (6) [facility] [address]; [place], [place] [address] [phone] MHL-[id number] [facility] [place] Adult- CSW called and left a voicemail. Luz Brazen. [facility] (6) [address]; [place], [place] [address] [phone] [id number] [facility] [place] Adult- No male bed placement available . [facility] (5) Clara Tresa ResYancey and Leslieherry Crisp [address]; [place], [place] [address] [phone] MHL-[id number]- - CSW called and left a voicemail. Geraldine Contras. [facility] (3) [address]; [place], [place] [address] [phone] MHL-[id number]- - CSW called and left a voicemail. Carlota Raspberry. [facility] (6) [facility] [place] [address]; [place], [place] [phone] MHL-001-220-No male beds Available . [name] (6) [facility]  [place] [address]; [place], [place] [address] [phone] MHL-001-220-Number not working . [facility] (6) Daisy Lazarherry S. Crisp [address]; [place], [place] [address] [phone] MHL-[id number]-CSW [facility] and told them to call her back once reviewed. . [facility] [address]; [place], [place] [phone]-Unable to leave a voicemail . [facility] (6) Dyron Dillard'sMoore [address]; [place], [place] [address] [phone] MHL-[id number]-Number disconnected . [facility] (5) [facility] [address]; [place], [place] [address] [phone] MHL-[id number]--Mailbox was full, unable to leave a voicemail . [facility] (6) [facility] [address]; [place], [place] [address]- Number disconnected . [address]; [place], [place] [address] [phone] MHL-001-201- Number disconnected . [facility] (4) [facility] [place] [address]; [place], [place] [address] [phone] to leave a voicemail. Mailbox was full . [facility] (6) [facility] [address]; [place], [place] [address] [phone] MHL-[id number]-Group home worker took down CSW's number and will provide to Justine NullNorma [place] who will then call back with information on placement availability. Provider took down CSW number and some patient information. She doesn't have any beds available but may know someone who does.    Johny Shearsassandra Akashdeep Chuba, MSW, Theresia MajorsLCSWA, Bridget HartshornLCASA Clinical Social Worker 04/23/2018 3:11 PM

## 2018-04-23 NOTE — Progress Notes (Signed)
Recreation Therapy Notes  Date: 04/23/2018  Time: 9:30 pm   Location: Craft Room   Behavioral response: N/A   Intervention Topic: Creative expressions  Discussion/Intervention: Patient did not attend group.   Clinical Observations/Feedback:  Patient did not attend group.   Shellie Goettl LRT/CTRS        David Davila 04/23/2018 10:54 AM 

## 2018-04-24 LAB — CBC WITH DIFFERENTIAL/PLATELET
BASOS PCT: 0 %
Basophils Absolute: 0 10*3/uL (ref 0–0.1)
Eosinophils Absolute: 0.2 10*3/uL (ref 0–0.7)
Eosinophils Relative: 4 %
HEMATOCRIT: 37.5 % — AB (ref 40.0–52.0)
HEMOGLOBIN: 13 g/dL (ref 13.0–18.0)
Lymphocytes Relative: 48 %
Lymphs Abs: 1.9 10*3/uL (ref 1.0–3.6)
MCH: 31.6 pg (ref 26.0–34.0)
MCHC: 34.6 g/dL (ref 32.0–36.0)
MCV: 91.3 fL (ref 80.0–100.0)
MONO ABS: 0.7 10*3/uL (ref 0.2–1.0)
MONOS PCT: 17 %
NEUTROS ABS: 1.3 10*3/uL — AB (ref 1.4–6.5)
Neutrophils Relative %: 31 %
Platelets: 136 10*3/uL — ABNORMAL LOW (ref 150–440)
RBC: 4.11 MIL/uL — AB (ref 4.40–5.90)
RDW: 13.7 % (ref 11.5–14.5)
WBC: 4.1 10*3/uL (ref 3.8–10.6)

## 2018-04-24 LAB — VALPROIC ACID LEVEL: VALPROIC ACID LVL: 61 ug/mL (ref 50.0–100.0)

## 2018-04-24 NOTE — BHH Group Notes (Signed)
[facility] Group Notes:  (Nursing/MHT/Case Management/Adjunct)  Date:  04/24/2018  Time:  1:33 AM  Type of Therapy:  Group Therapy  Participation Level:  Did Not Attend   Jinger NeighborsKeith D Giorgi Debruin 04/24/2018, 1:33 AM

## 2018-04-24 NOTE — Plan of Care (Signed)
Patient is alert and oriented, denies SI, HI and AVH. Patient is cooperative and pleasant. Isolates most of the time to room. Patient speech is logical and coherent. Patient rates pain 0/10. Eating meals appropriately; no GI/GU issues or concerns from patient. Patient mental status is improving, no signs of self injurious behaviors. Nurse will continue to monitor. Problem: Role Relationship: Goal: Ability to communicate needs accurately will improve Outcome: Progressing Goal: Ability to interact with others will improve Outcome: Progressing   Problem: Nutritional: Goal: Ability to achieve adequate nutritional intake will improve Outcome: Progressing   Problem: Safety: Goal: Ability to redirect hostility and anger into socially appropriate behaviors will improve Outcome: Progressing Goal: Ability to remain free from injury will improve Outcome: Progressing   Problem: Self-Care: Goal: Ability to participate in self-care as condition permits will improve Outcome: Progressing

## 2018-04-24 NOTE — Plan of Care (Signed)
  Problem: Activity: Goal: Will verbalize the importance of balancing activity with adequate rest periods Outcome: Progressing  Patient interacting with peers and staff appropriately visible in the milieu

## 2018-04-24 NOTE — Tx Team (Signed)
Interdisciplinary Treatment and Diagnostic Plan Update  04/24/2018 Time of Session: 11:30am David Mulleraul Stephenson Ohagan Jr. MRN: [id number]  Principal Diagnosis: Undifferentiated schizophrenia (HCC)  Secondary Diagnoses: Principal Problem:   Undifferentiated schizophrenia (HCC)   Current Medications:  Current Facility-Administered Medications  Medication Dose Route Frequency Provider Last Rate Last Dose  . acetaminophen (TYLENOL) tablet 650 mg  650 mg Oral Q6H PRN Clapacs, John T, MD      . alum & mag hydroxide-simeth (MAALOX/MYLANTA) 200-200-20 MG/5ML suspension 30 mL  30 mL Oral Q4H PRN Clapacs, John T, MD      . benztropine (COGENTIN) tablet 1 mg  1 mg Oral BID PRN McNew, Ileene HutchinsonHolly R, MD   1 mg at 04/08/18 2048   Or  . benztropine mesylate (COGENTIN) injection 1 mg  1 mg Intramuscular BID PRN McNew, Ileene HutchinsonHolly R, MD      . benztropine (COGENTIN) tablet 1 mg  1 mg Oral QHS McNew, Ileene HutchinsonHolly R, MD   1 mg at 04/23/18 2124  . divalproex (DEPAKOTE) DR tablet 1,000 mg  1,000 mg Oral BH-q7a McNew, Ileene HutchinsonHolly R, MD   1,000 mg at 04/23/18 [id number]   And  . divalproex (DEPAKOTE) DR tablet 750 mg  750 mg Oral QHS McNew, Holly R, MD   750 mg at 04/23/18 2125  . haloperidol (HALDOL) tablet 5 mg  5 mg Oral Q6H PRN McNew, Ileene HutchinsonHolly R, MD       Or  . haloperidol lactate (HALDOL) injection 5 mg  5 mg Intramuscular Q6H PRN McNew, Ileene HutchinsonHolly R, MD   5 mg at 04/01/18 0819  . haloperidol (HALDOL) tablet 5 mg  5 mg Oral QHS McNew, Ileene HutchinsonHolly R, MD   5 mg at 04/23/18 2125  . hydrOXYzine (ATARAX/VISTARIL) tablet 50 mg  50 mg Oral TID PRN Clapacs, Jackquline DenmarkJohn T, MD   50 mg at 04/23/18 2125  . LORazepam (ATIVAN) tablet 1 mg  1 mg Oral Q6H PRN Haskell RilingMcNew, Holly R, MD   1 mg at 04/16/18 2134   Or  . LORazepam (ATIVAN) injection 1 mg  1 mg Intramuscular Q6H PRN Haskell RilingMcNew, Holly R, MD   1 mg at 04/01/18 0820  . magnesium hydroxide (MILK OF MAGNESIA) suspension 30 mL  30 mL Oral Daily PRN Clapacs, John T, MD      . traZODone (DESYREL) tablet 100 mg  100 mg Oral QHS PRN  Clapacs, Jackquline DenmarkJohn T, MD   100 mg at 04/21/18 2126   PTA Medications: Medications Prior to Admission  Medication Sig Dispense Refill Last Dose  . atorvastatin (LIPITOR) 10 MG tablet Take 1 tablet (10 mg total) by mouth daily at 6 PM. (Patient not taking: Reported on 11/16/2017) 30 tablet 0 Not Taking at Unknown time  . divalproex (DEPAKOTE ER) 500 MG 24 hr tablet Take 1,000 mg by mouth daily.   unknown at unknown  . hydrOXYzine (ATARAX/VISTARIL) 25 MG tablet Take 1 tablet (25 mg total) by mouth 3 (three) times daily. (Patient not taking: Reported on 11/16/2017) 30 tablet 0 Not Taking at Unknown time  . lamoTRIgine (LAMICTAL) 100 MG tablet Take 1 tablet (100 mg total) by mouth every evening. (Patient not taking: Reported on 11/16/2017) 30 tablet 0 Not Taking at Unknown time  . lamoTRIgine (LAMICTAL) 25 MG tablet Take 2 tablets (50 mg total) by mouth daily. (Patient not taking: Reported on 11/16/2017) 60 tablet 0 Not Taking at Unknown time  . loratadine (CLARITIN) 10 MG tablet Take 10 mg by mouth daily.   unknown at unknown  .  LORazepam (ATIVAN) 0.5 MG tablet Take 1 tablet (0.5 mg total) by mouth 2 (two) times daily. (Patient not taking: Reported on 11/16/2017) 60 tablet 0 Not Taking at Unknown time  . OLANZapine zydis (ZYPREXA) 5 MG disintegrating tablet Take 1 tablet (5 mg total) by mouth at bedtime. (Patient not taking: Reported on 11/16/2017) 30 tablet 0 Not Taking at Unknown time  . paliperidone (INVEGA SUSTENNA) 156 MG/ML SUSP injection Inject 1 mL (156 mg total) into the muscle every 28 (twenty-eight) days. NEXT DOSE DUE August 13, 2016 (Patient taking differently: Inject 156 mg into the muscle every 28 (twenty-eight) days. ) 0.9 mL 0   . traZODone (DESYREL) 100 MG tablet Take 1 tablet (100 mg total) by mouth at bedtime. (Patient taking differently: Take 50 mg by mouth at bedtime. ) 30 tablet 0 Not Taking at Unknown time    Patient Stressors: Medication change or noncompliance Substance abuse  Patient  Strengths: General fund of knowledge  Treatment Modalities: Medication Management, Group therapy, Case management,  1 to 1 session with clinician, Psychoeducation, Recreational therapy.   Physician Treatment Plan for Primary Diagnosis: Undifferentiated schizophrenia (HCC) Long Term Goal(s): Improvement in symptoms so as ready for discharge   Short Term Goals: Ability to identify changes in lifestyle to reduce recurrence of condition will improve  Medication Management: Evaluate patient's response, side effects, and tolerance of medication regimen.  Therapeutic Interventions: 1 to 1 sessions, Unit Group sessions and Medication administration.  Evaluation of Outcomes: Progressing   9/9:Schizoaffective disorder -Next TanzaniaINvega Sustenna injection due on 04/30/18 -Continue Depakote 1000 mg qam and 750 mg qhs -Continue Haldol 5 mg dailyPt is in good mood today. He is smiling and less irritable. He is hoping to get an interview with a group home so he can get out of the hospital. He states taht he is feeling good. He states, "I'm not talking to myself anymore."    Physician Treatment Plan for Secondary Diagnosis: Principal Problem:   Undifferentiated schizophrenia (HCC)  Long Term Goal(s): Improvement in symptoms so as ready for discharge   Short Term Goals: Ability to identify changes in lifestyle to reduce recurrence of condition will improve     Medication Management: Evaluate patient's response, side effects, and tolerance of medication regimen.  Therapeutic Interventions: 1 to 1 sessions, Unit Group sessions and Medication administration.  Evaluation of Outcomes: Progressing   RN Treatment Plan for Primary Diagnosis: Undifferentiated schizophrenia (HCC) Long Term Goal(s): Knowledge of disease and therapeutic regimen to maintain health will improve  Short Term Goals: Ability to verbalize feelings will improve, Ability to identify and develop effective coping behaviors will improve  and Compliance with prescribed medications will improve  Medication Management: RN will administer medications as ordered by provider, will assess and evaluate patient's response and provide education to patient for prescribed medication. RN will report any adverse and/or side effects to prescribing provider.  Therapeutic Interventions: 1 on 1 counseling sessions, Psychoeducation, Medication administration, Evaluate responses to treatment, Monitor vital signs and CBGs as ordered, Perform/monitor CIWA, COWS, AIMS and Fall Risk screenings as ordered, Perform wound care treatments as ordered.  Evaluation of Outcomes: Progressing   LCSW Treatment Plan for Primary Diagnosis: Undifferentiated schizophrenia (HCC) Long Term Goal(s): Safe transition to appropriate next level of care at discharge, Engage patient in therapeutic group addressing interpersonal concerns.  Short Term Goals: Engage patient in aftercare planning with referrals and resources, Increase social support, Increase emotional regulation, Identify triggers associated with mental health/substance abuse issues and Increase skills for  wellness and recovery  Therapeutic Interventions: Assess for all discharge needs, 1 to 1 time with Social worker, Explore available resources and support systems, Assess for adequacy in community support network, Educate family and significant other(s) on suicide prevention, Complete Psychosocial Assessment, Interpersonal group therapy.  Evaluation of Outcomes: Progressing   Progress in Treatment: Attending groups: Yes. Participating in groups: Yes. Taking medication as prescribed: Yes. Toleration medication: Yes. Family/Significant other contact made: Yes, individual(s) contacted:  Patients Guardian Patient understands diagnosis: Yes. Discussing patient identified problems/goals with staff: Yes. Medical problems stabilized or resolved: Yes. Denies suicidal/homicidal ideation: Yes. Issues/concerns per  patient self-inventory: No. Other:   New problem(s) identified: No, Describe:  None  New Short Term/Long Term Goal(s): "Take care of my family."  Patient Goals:   "Take care of my family."  Discharge Plan or Barriers: To discharge to a new group home and engage with outpatient services or ACT Team  Reason for Continuation of Hospitalization: Medication stabilization  Estimated Length of Stay: 5-7 days Recreational Therapy: Patient Stressors: N/A Patient Goal: Patient will engage in interactions with peers and staff in pro-social manner at least 2x within 5 recreation therapy group sessions   Attendees: Patient:  04/24/2018 12:02 PM  Physician: Corinna Gab, MD 04/24/2018 12:02 PM  Nursing: 04/24/2018 12:02 PM  RN Care Manager: 04/24/2018 12:02 PM  Social Worker: David Davila, LCSWA 04/24/2018 12:02 PM  Recreational Therapist:  04/24/2018 12:02 PM  Other:  04/24/2018 12:02 PM  Other:  04/24/2018 12:02 PM  Other: 04/24/2018 12:02 PM    Scribe for Treatment Team: David Shears, LCSW 04/24/2018 12:02 PM

## 2018-04-24 NOTE — Progress Notes (Signed)
[facility] MD Progress Note  04/24/2018 4:27 PM David Davila.  MRN:  [id number] Subjective:  Met with Pt, his guardian and CSW today for treatment team. Pt was very calm, pleasant and organized in his thoughts. He was able to answer questions appropriately. He had brighter affect and was smiling and laughing today (very appropriately) which I haven't seen much of. He is eating well. He told his guardian what he would like in a group home and his only suggestions were "To have my own room and close to a Art therapist.' HE did not endorse SI or any thoughts of self harm. Did not appear to be responding to internal stimuli.  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 Undifferentiated schizophrenia ([place]) [F20.3] 03/26/2018    Priority: High  . TBI (traumatic brain injury) ([place]) [S06.9X9A] 07/10/2016  . Vitamin B12 deficiency [E53.8] 07/10/2016  . Noncompliance with medication regimen [Z91.14] 06/25/2016  . Cocaine use disorder, mild, abuse ([place]) [F14.10] 06/25/2016  . Cannabis use disorder, mild, abuse [F12.10] 06/25/2016  . Paranoid schizophrenia ([place]) [F20.0] 06/24/2016   Total Time spent with patient: 20 minutes  Past Psychiatric History: See h&P  Past Medical History:  Past Medical History:  Diagnosis Date  . Acute psychosis ([place])   . Antisocial personality disorder ([place])   . Cannabis abuse    Past  . Paranoid schizophrenia ([place])   . Schizoaffective disorder, bipolar type ([place])    History reviewed. No pertinent surgical history. Family History: History reviewed. No pertinent family history. Family Psychiatric  History: See H&P Social History:  Social History   Substance and Sexual Activity  Alcohol Use No     Social History   Substance and Sexual Activity  Drug Use Not Currently    Social History   Socioeconomic History  . Marital status: Single    Spouse name: Not on file  . Number of  children: Not on file  . Years of education: Not on file  . Highest education level: Not on file  Occupational History  . Not on file  Social Needs  . Financial resource strain: Not on file  . Food insecurity:    Worry: Not on file    Inability: Not on file  . Transportation needs:    Medical: Not on file    Non-medical: Not on file  Tobacco Use  . Smoking status: Former Smoker    Packs/day: 1.00    Types: Cigarettes  . Smokeless tobacco: Never Used  Substance and Sexual Activity  . Alcohol use: No  . Drug use: Not Currently  . Sexual activity: Not Currently  Lifestyle  . Physical activity:    Days per week: Not on file    Minutes per session: Not on file  . Stress: Not on file  Relationships  . Social connections:    Talks on phone: Not on file    Gets together: Not on file    Attends religious service: Not on file    Active member of club or organization: Not on file    Attends meetings of clubs or organizations: Not on file    Relationship status: Not on file  Other Topics Concern  . Not on file  Social History Narrative  . Not on file   Additional Social History:  Sleep: Fair  Appetite:  Good  Current Medications: Current Facility-Administered Medications  Medication Dose Route Frequency Provider Last Rate Last Dose  . acetaminophen (TYLENOL) tablet 650 mg  650 mg Oral Q6H PRN Clapacs, John T, MD      . alum & mag hydroxide-simeth (MAALOX/MYLANTA) 200-200-20 MG/5ML suspension 30 mL  30 mL Oral Q4H PRN Clapacs, John T, MD      . benztropine (COGENTIN) tablet 1 mg  1 mg Oral BID PRN Eldred Lievanos, Tyson Babinski, MD   1 mg at 04/08/18 2048   Or  . benztropine mesylate (COGENTIN) injection 1 mg  1 mg Intramuscular BID PRN Ranyah Groeneveld, Tyson Babinski, MD      . benztropine (COGENTIN) tablet 1 mg  1 mg Oral QHS Kerim Statzer, Tyson Babinski, MD   1 mg at 04/23/18 2124  . divalproex (DEPAKOTE) DR tablet 1,000 mg  1,000 mg Oral BH-q7a Tige Meas, Tyson Babinski, MD   1,000 mg at  04/23/18 4097   And  . divalproex (DEPAKOTE) DR tablet 750 mg  750 mg Oral QHS Tyrie Porzio R, MD   750 mg at 04/23/18 2125  . haloperidol (HALDOL) tablet 5 mg  5 mg Oral Q6H PRN Lindley Stachnik, Tyson Babinski, MD       Or  . haloperidol lactate (HALDOL) injection 5 mg  5 mg Intramuscular Q6H PRN Yandell Mcjunkins, Tyson Babinski, MD   5 mg at 04/01/18 0819  . haloperidol (HALDOL) tablet 5 mg  5 mg Oral QHS Tenisha Fleece, Tyson Babinski, MD   5 mg at 04/23/18 2125  . hydrOXYzine (ATARAX/VISTARIL) tablet 50 mg  50 mg Oral TID PRN Clapacs, Madie Reno, MD   50 mg at 04/23/18 2125  . LORazepam (ATIVAN) tablet 1 mg  1 mg Oral Q6H PRN Marylin Crosby, MD   1 mg at 04/16/18 2134   Or  . LORazepam (ATIVAN) injection 1 mg  1 mg Intramuscular Q6H PRN Marylin Crosby, MD   1 mg at 04/01/18 0820  . magnesium hydroxide (MILK OF MAGNESIA) suspension 30 mL  30 mL Oral Daily PRN Clapacs, John T, MD      . traZODone (DESYREL) tablet 100 mg  100 mg Oral QHS PRN Clapacs, Madie Reno, MD   100 mg at 04/21/18 2126    Lab Results: No results found for this or any previous visit (from the past 48 hour(s)).  Blood Alcohol level:  Lab Results  Component Value Date   ETH <10 03/25/2018   ETH <10 35/32/9924    Metabolic Disorder Labs: Lab Results  Component Value Date   HGBA1C 5.4 03/25/2018   MPG 108.28 03/25/2018   No results found for: PROLACTIN Lab Results  Component Value Date   CHOL 184 03/25/2018   TRIG 43 03/25/2018   HDL 50 03/25/2018   CHOLHDL 3.7 03/25/2018   VLDL 9 03/25/2018   LDLCALC 125 (H) 03/25/2018    Physical Findings: AIMS: Facial and Oral Movements Muscles of Facial Expression: None, normal Lips and Perioral Area: None, normal Jaw: None, normal Tongue: None, normal,Extremity Movements Upper (arms, wrists, hands, fingers): None, normal Lower (legs, knees, ankles, toes): None, normal, Trunk Movements Neck, shoulders, hips: None, normal, Overall Severity Severity of abnormal movements (highest score from questions above): None,  normal Incapacitation due to abnormal movements: None, normal Patient's awareness of abnormal movements (rate only patient's report): No Awareness, Dental Status Current problems with teeth and/or dentures?: No Does patient usually wear dentures?: No  CIWA:  CIWA-Ar Total: 0 COWS:  COWS  Total Score: 0  Musculoskeletal: Strength & Muscle Tone: within normal limits Gait & Station: normal Patient leans: N/A  Psychiatric Specialty Exam: Physical Exam  Nursing note and vitals reviewed.   Review of Systems  All other systems reviewed and are negative.   Blood pressure 93/64, pulse 66, temperature 98.4 F (36.9 C), temperature source Oral, resp. rate 18, height _0  (1.727 m), weight 58.1 kg, SpO2 98 %.Body mass index is 19.46 kg/m.  General Appearance: Casual  Eye Contact:  Fair  Speech:  Clear and Coherent  Volume:  Normal  Mood:  Euthymic  Affect:  Appropriate  Thought Process:  Coherent and Goal Directed  Orientation:  Full (Time, Place, and Person)  Thought Content:  Logical  Suicidal Thoughts:  No  Homicidal Thoughts:  No  Memory:  Immediate;   Fair  Judgement:  Impaired  Insight:  Lacking  Psychomotor Activity:  Normal  Concentration:  Concentration: Fair  Recall:  [employer] of Knowledge:  Fair  Language:  Fair  Akathisia:  No      Assets:  Resilience  ADL's:  Intact  Cognition:  WNL  Sleep:  Number of Hours: 7     Treatment Plan Summary: 37 yo male admitted due to psychosis which has greatly improved. We met with his guardian who is working hard on finding placement for him. He will be referred to ACT team when placement is found.   Plan:  Schizoaffective disorder -Next [facility] Boys due on 9/19 -CBC has not been drawn yet for unclear reasons. Will reorder -Continue Depakote 100 mg qam and 750 mg qhs. Depakote level not drawn. Will reorder -Continue Haldol 5 mg qhs  Dispo -Waiting placement to group home. Met with guardian today Marylin Crosby,  MD 04/24/2018, 4:27 PM

## 2018-04-24 NOTE — Progress Notes (Signed)
Recreation Therapy Notes  Date: 04/24/2018  Time: 9:30 pm   Location: Craft Room   Behavioral response: N/A   Intervention Topic: Leisure  Discussion/Intervention: Patient did not attend group.   Clinical Observations/Feedback:  Patient did not attend group.   Tristen Pennino LRT/CTRS        Lawson Mahone 04/24/2018 11:45 AM 

## 2018-04-24 NOTE — [facility] Group Notes (Signed)
04/24/2018 1PM  Type of Therapy and Topic:  Group Therapy:  Feelings around Relapse and Recovery  Participation Level:  Did Not Attend   Description of Group:    Patients in this group will discuss emotions they experience before and after a relapse. They will process how experiencing these feelings, or avoidance of experiencing them, relates to having a relapse. Facilitator will guide patients to explore emotions they have related to recovery. Patients will be encouraged to process which emotions are more powerful. They will be guided to discuss the emotional reaction significant others in their lives may have to patients' relapse or recovery. Patients will be assisted in exploring ways to respond to the emotions of others without this contributing to a relapse.  Therapeutic Goals: 1. Patient will identify two or more emotions that lead to a relapse for them 2. Patient will identify two emotions that result when they relapse 3. Patient will identify two emotions related to recovery 4. Patient will demonstrate ability to communicate their needs through discussion and/or role plays   Summary of Patient Progress: Patient was encouraged and invited to attend group. Patient did not attend group. Social worker will continue to encourage group participation in the future.      Therapeutic Modalities:   Cognitive Behavioral Therapy Solution-Focused Therapy Assertiveness Training Relapse Prevention Therapy   Mayci Haning, LCSW 04/24/2018 2:40 PM    

## 2018-04-24 NOTE — Progress Notes (Signed)
Patient refused early morning dose of Depakote, he stated " I don't want it" on coming shift will be notified

## 2018-04-24 NOTE — Progress Notes (Signed)
D:Patient alert and oriented x4,denies SI/HI/AVHbut noted responding to internal stimuli, very pleasant and polite and cooperativewith treatment plans,his thoughts  Are less disorganized,speech isnonpressured,he appears less anxious and he isinteracting with peers and staff appropriately.  A:Patientwas offered support and encouragement, and wasgiven scheduled medications.Patientwasalsoencouraged to attend groups, and15 minute checks weremaintainedfor safety.  R:Patient did not attend evening group, he is complaint withmedication, andhas no complaints.Safety maintained on unit, will continue to monitor.

## 2018-04-25 DIAGNOSIS — F203 Undifferentiated schizophrenia: Secondary | ICD-10-CM

## 2018-04-25 NOTE — [facility] Group Notes (Signed)
[facility] Group Notes:  (Nursing/MHT/Case Management/Adjunct)  Date:  04/25/2018  Time:  1:07 AM  Type of Therapy:  Group Therapy  Participation Level:  Active  Participation Quality:  Appropriate  Affect:  Appropriate  Cognitive:  Appropriate  Insight:  Appropriate  Engagement in Group:  Engaged  Modes of Intervention:  Discussion  Summary of Progress/Problems: [name] attended half of group. [name] simply stated he wanted to be positive during group [name] was appropriate and did not disrupt group [name] was respectful of others MHT reviewed the rules and expectations of the unit. 1. Visitation hours and number of visitors 2. Phone hours 3. No food in rooms (explained why food was not permitted in rooms) 4. No sharing clothes or borrowing clothes 5. Explained how snack would be provided 6. No sharing personal information or last names 7. No touching, hugging, doing hair, or going into other patients rooms MHT processed with patients about the importance of attending groups MHT explained groups were designed to help patients learn new coping skills and gain insight into why they were admitted MHT explained learning new coping skills and new things about self would help prepare them for discharge MHT processed with group about after care and the importance of taking medications MHT encouraged patients to talk with providers about their care. MHT addressed concerns and answered questions related to care MHT informed patients to be active in creating their person-centered plan MHT informed group of services available upon discharge, even those who do not have insurance  Jinger NeighborsKeith D Donte Lenzo 04/25/2018, 1:07 AM

## 2018-04-25 NOTE — Progress Notes (Signed)
Patient alert and oriented x 4 , denies SI/HI/AVH interacting appropriately with  peers and staff no distress noted,will continue to monitor.

## 2018-04-25 NOTE — Plan of Care (Signed)
D: Patient did not complete a self inventory today. Patient isolates to self and has minimal interaction with staff. Patient is out in milieu during meal times. Patient is also seen walking to halls interacting with internal stimuli at times. Patient denies SI/HI/AVH.    A: Patient was assessed by this nurse. Patient was oriented to unit. Patient's safety was maintained on unit. Q x 15 minute observation checks were completed for safety. Patient care plan was reviewed. Patient was offered support and encouragement. Patient was encourage to attend groups, participate in unit activities and continue with plan of care.    R: Patient has no complaints of pain at this time. Patient is receptive to treatment and safety maintained on unit.     Problem: Education: Goal: Knowledge of [facility] Education information/materials will improve Outcome: Not Progressing Goal: Emotional status will improve Outcome: Not Progressing Goal: Mental status will improve Outcome: Not Progressing

## 2018-04-25 NOTE — B[facility] Group Notes (Signed)
LCSW Group Therapy Note  04/25/2018 1:15pm  Type of Therapy and Topic:  Group Therapy:  Healthy Self Image and Positive Change  Participation Level:  Did Not Attend   Description of Group:  In this group, patients will compare and contrast their current "I am...." statements to the visions they identify as desirable for their lives.  Patients discuss fears and how they can make positive changes in their cognitions that will positively impact their behaviors.  Facilitator played a motivational 3-minute speech and patients were left with the task of thinking about what "I am...." statements they can start using in their lives immediately.  Therapeutic Goals: 1. Patient will state their current self-perception as expressed in an "I Am" statement 2. Patient will contrast this with their desired vision for their live 3. Patient will identify 3 fears that negatively impact their behavior 4. Patient will discuss cognitive distortions that stem from their fears 5. Patient will verbalize statements that challenge their cognitive distortions  Summary of Patient Progress:  Pt was invited to attend group but chose not to attend. CSW will continue to encourage pt to attend group throughout their admission.    Therapeutic Modalities Cognitive Behavioral Therapy Motivational Interviewing  Niyana Chesbro  CUEBAS-COLON, LCSW 04/25/2018 11:15 AM

## 2018-04-25 NOTE — Progress Notes (Signed)
[facility] MD Progress Note  04/25/2018 4:01 PM Faythe Casa.  MRN:  [id number] Subjective: Patient endorses that he is well, denies any complaints.  Has any suicidal thoughts.  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 Undifferentiated schizophrenia ([place]) [F20.3] 03/26/2018  . TBI (traumatic brain injury) ([place]) [S06.9X9A] 07/10/2016  . Vitamin B12 deficiency [E53.8] 07/10/2016  . Noncompliance with medication regimen [Z91.14] 06/25/2016  . Cocaine use disorder, mild, abuse ([place]) [F14.10] 06/25/2016  . Cannabis use disorder, mild, abuse [F12.10] 06/25/2016  . Paranoid schizophrenia ([place]) [F20.0] 06/24/2016   Total Time spent with patient: 20 minutes  Past Psychiatric History: See h&P  Past Medical History:  Past Medical History:  Diagnosis Date  . Acute psychosis ([place])   . Antisocial personality disorder ([place])   . Cannabis abuse    Past  . Paranoid schizophrenia ([place])   . Schizoaffective disorder, bipolar type ([place])    History reviewed. No pertinent surgical history. Family History: History reviewed. No pertinent family history. Family Psychiatric  History: See H&P Social History:  Social History   Substance and Sexual Activity  Alcohol Use No     Social History   Substance and Sexual Activity  Drug Use Not Currently    Social History   Socioeconomic History  . Marital status: Single    Spouse name: Not on file  . Number of children: Not on file  . Years of education: Not on file  . Highest education level: Not on file  Occupational History  . Not on file  Social Needs  . Financial resource strain: Not on file  . Food insecurity:    Worry: Not on file    Inability: Not on file  . Transportation needs:    Medical: Not on file    Non-medical: Not on file  Tobacco Use  . Smoking status: Former Smoker    Packs/day: 1.00    Types: Cigarettes  . Smokeless tobacco: Never  Used  Substance and Sexual Activity  . Alcohol use: No  . Drug use: Not Currently  . Sexual activity: Not Currently  Lifestyle  . Physical activity:    Days per week: Not on file    Minutes per session: Not on file  . Stress: Not on file  Relationships  . Social connections:    Talks on phone: Not on file    Gets together: Not on file    Attends religious service: Not on file    Active member of club or organization: Not on file    Attends meetings of clubs or organizations: Not on file    Relationship status: Not on file  Other Topics Concern  . Not on file  Social History Narrative  . Not on file   Additional Social History:                         Sleep: Fair  Appetite:  Good  Current Medications: Current Facility-Administered Medications  Medication Dose Route Frequency Provider Last Rate Last Dose  . acetaminophen (TYLENOL) tablet 650 mg  650 mg Oral Q6H PRN Clapacs, John T, MD      . alum & mag hydroxide-simeth (MAALOX/MYLANTA) 200-200-20 MG/5ML suspension 30 mL  30 mL Oral Q4H PRN Clapacs, John T, MD      . benztropine (COGENTIN) tablet 1 mg  1 mg Oral BID PRN McNew, Tyson Babinski, MD   1  mg at 04/08/18 2048   Or  . benztropine mesylate (COGENTIN) injection 1 mg  1 mg Intramuscular BID PRN McNew, Tyson Babinski, MD      . benztropine (COGENTIN) tablet 1 mg  1 mg Oral QHS McNew, Tyson Babinski, MD   1 mg at 04/24/18 2128  . divalproex (DEPAKOTE) DR tablet 1,000 mg  1,000 mg Oral BH-q7a McNew, Tyson Babinski, MD   1,000 mg at 04/23/18 6644   And  . divalproex (DEPAKOTE) DR tablet 750 mg  750 mg Oral QHS McNew, Holly R, MD   750 mg at 04/24/18 2128  . haloperidol (HALDOL) tablet 5 mg  5 mg Oral Q6H PRN McNew, Tyson Babinski, MD       Or  . haloperidol lactate (HALDOL) injection 5 mg  5 mg Intramuscular Q6H PRN McNew, Tyson Babinski, MD   5 mg at 04/01/18 0819  . haloperidol (HALDOL) tablet 5 mg  5 mg Oral QHS McNew, Tyson Babinski, MD   5 mg at 04/24/18 2128  . hydrOXYzine (ATARAX/VISTARIL) tablet 50 mg   50 mg Oral TID PRN Clapacs, Madie Reno, MD   50 mg at 04/23/18 2125  . LORazepam (ATIVAN) tablet 1 mg  1 mg Oral Q6H PRN Marylin Crosby, MD   1 mg at 04/16/18 2134   Or  . LORazepam (ATIVAN) injection 1 mg  1 mg Intramuscular Q6H PRN Marylin Crosby, MD   1 mg at 04/01/18 0820  . magnesium hydroxide (MILK OF MAGNESIA) suspension 30 mL  30 mL Oral Daily PRN Clapacs, Madie Reno, MD      . traZODone (DESYREL) tablet 100 mg  100 mg Oral QHS PRN Clapacs, Madie Reno, MD   100 mg at 04/24/18 2128    Lab Results:  Results for orders placed or performed during the hospital encounter of 03/26/18 (from the past 48 hour(s))  CBC with Differential/Platelet     Status: Abnormal   Collection Time: 04/24/18  5:09 PM  Result Value Ref Range   WBC 4.1 3.8 - 10.6 K/uL   RBC 4.11 (L) 4.40 - 5.90 MIL/uL   Hemoglobin 13.0 13.0 - 18.0 g/dL   HCT 37.5 (L) 40.0 - 52.0 %   MCV 91.3 80.0 - 100.0 fL   MCH 31.6 26.0 - 34.0 pg   MCHC 34.6 32.0 - 36.0 g/dL   RDW 13.7 11.5 - 14.5 %   Platelets 136 (L) 150 - 440 K/uL   Neutrophils Relative % 31 %   Neutro Abs 1.3 (L) 1.4 - 6.5 K/uL   Lymphocytes Relative 48 %   Lymphs Abs 1.9 1.0 - 3.6 K/uL   Monocytes Relative 17 %   Monocytes Absolute 0.7 0.2 - 1.0 K/uL   Eosinophils Relative 4 %   Eosinophils Absolute 0.2 0 - 0.7 K/uL   Basophils Relative 0 %   Basophils Absolute 0.0 0 - 0.1 K/uL    Comment: Performed at [facility], [address] [place], [place] [address]  Valproic acid level     Status: None   Collection Time: 04/24/18  5:09 PM  Result Value Ref Range   Valproic Acid Lvl 61 50.0 - 100.0 ug/mL    Comment: Performed at [facility], [address], [place], [place] [address]    Blood Alcohol level:  Lab Results  Component Value Date   [facility] <10 03/25/2018   ETH <10 38/75/6433    Metabolic Disorder Labs: Lab Results  Component Value Date   HGBA1C  5.4 03/25/2018   MPG 108.28 03/25/2018   No results found for: PROLACTIN Lab Results   Component Value Date   CHOL 184 03/25/2018   TRIG 43 03/25/2018   HDL 50 03/25/2018   CHOLHDL 3.7 03/25/2018   VLDL 9 03/25/2018   LDLCALC 125 (H) 03/25/2018    Physical Findings: AIMS: Facial and Oral Movements Muscles of Facial Expression: None, normal Lips and Perioral Area: None, normal Jaw: None, normal Tongue: None, normal,Extremity Movements Upper (arms, wrists, hands, fingers): None, normal Lower (legs, knees, ankles, toes): None, normal, Trunk Movements Neck, shoulders, hips: None, normal, Overall Severity Severity of abnormal movements (highest score from questions above): None, normal Incapacitation due to abnormal movements: None, normal Patient's awareness of abnormal movements (rate only patient's report): No Awareness, Dental Status Current problems with teeth and/or dentures?: No Does patient usually wear dentures?: No  CIWA:  CIWA-Ar Total: 0 COWS:  COWS Total Score: 0  Musculoskeletal: Strength & Muscle Tone: within normal limits Gait & Station: normal Patient leans: N/A  Psychiatric Specialty Exam: Physical Exam  Nursing note and vitals reviewed.   Review of Systems  All other systems reviewed and are negative.   Blood pressure 93/64, pulse 66, temperature 98.4 F (36.9 C), temperature source Oral, resp. rate 18, height '5\' 8"'  (1.727 m), weight 58.1 kg, SpO2 98 %.Body mass index is 19.46 kg/m.  General Appearance: Casual  Eye Contact:  Fair  Speech:  Clear and Coherent  Volume:  Normal  Mood:  Euthymic  Affect:  Appropriate  Thought Process:  Coherent and Goal Directed  Orientation:  Full (Time, Place, and Person)  Thought Content:  Logical  Suicidal Thoughts:  No  Homicidal Thoughts:  No  Memory:  Immediate;   Fair  Judgement:  Impaired  Insight:  Lacking  Psychomotor Activity:  Normal  Concentration:  Concentration: Fair  Recall:  [employer] of Knowledge:  Fair  Language:  Fair  Akathisia:  No      Assets:  Resilience  ADL's:   Intact  Cognition:  WNL  Sleep:  Number of Hours: 7.15     Treatment Plan Summary: 37 yo male admitted due to psychosis which has greatly improved. We met with his guardian who is working hard on finding placement for him. He will be referred to ACT team when placement is found.   Plan:  Schizoaffective disorder -Next [facility] due on 9/19 -CBC has not been drawn yet for unclear reasons. Will reorder -Continue Depakote 100 mg qam and 750 mg qhs. Depakote level not drawn. Will reorder -Continue Haldol 5 mg qhs  Dispo -Waiting placement to group home. Met with guardian today Ramond Dial, MD 04/25/2018, 4:01 PM

## 2018-04-26 DIAGNOSIS — F203 Undifferentiated schizophrenia: Secondary | ICD-10-CM

## 2018-04-26 NOTE — [facility] Group Notes (Signed)
[facility] Group Notes:  (Nursing/MHT/Case Management/Adjunct)  Date:  04/26/2018  Time:  1:00 AM  Type of Therapy:  Group Therapy  Participation Level:  Did Not Attend   David Davila 04/26/2018, 1:00 AM

## 2018-04-26 NOTE — Plan of Care (Signed)
Pt. Is complaint with medications. Pt. Denies Si/hi and contracts for safety. Pt. Needs reinforcement on provided education. Pt. Continues to present flat and blunted this evening, forwards little and is minimal. Pt. Participation is poor.    Problem: Education: Goal: Knowledge of [place] [facility] Education information/materials will improve Outcome: Not Progressing Goal: Emotional status will improve Outcome: Not Progressing Goal: Mental status will improve Outcome: Not Progressing   Problem: Activity: Goal: Interest or engagement in activities will improve Outcome: Not Progressing   Problem: Education: Goal: Will be free of psychotic symptoms Outcome: Not Progressing   Problem: Health Behavior/Discharge Planning: Goal: Compliance with treatment plan for underlying cause of condition will improve Outcome: Progressing   Problem: Safety: Goal: Periods of time without injury will increase Outcome: Progressing   Problem: Safety: Goal: Ability to remain free from injury will improve Outcome: Progressing   Problem: Safety: Goal: Ability to remain free from injury will improve Outcome: Progressing

## 2018-04-26 NOTE — [facility] Group Notes (Signed)
[facility] Group Notes:  (Nursing/MHT/Case Management/Adjunct)  Date:  04/26/2018  Time:  12:59 AM  Type of Therapy:  Group Therapy  Participation Level:  Did Not Attend   David NeerJackie L Noel Henandez 04/26/2018, 12:59 AM

## 2018-04-26 NOTE — Progress Notes (Signed)
[facility] MD Progress Note  04/26/2018 2:21 PM David Davila.  MRN:  [id number] Subjective: Patient endorses that he is well, denies any complaints.  Has any suicidal thoughts.  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 Undifferentiated schizophrenia ([place]) [F20.3] 03/26/2018  . TBI (traumatic brain injury) ([place]) [S06.9X9A] 07/10/2016  . Vitamin B12 deficiency [E53.8] 07/10/2016  . Noncompliance with medication regimen [Z91.14] 06/25/2016  . Cocaine use disorder, mild, abuse ([place]) [F14.10] 06/25/2016  . Cannabis use disorder, mild, abuse [F12.10] 06/25/2016  . Paranoid schizophrenia ([place]) [F20.0] 06/24/2016   Total Time spent with patient: 20 minutes  Past Psychiatric History: See h&P  Past Medical History:  Past Medical History:  Diagnosis Date  . Acute psychosis ([place])   . Antisocial personality disorder ([place])   . Cannabis abuse    Past  . Paranoid schizophrenia ([place])   . Schizoaffective disorder, bipolar type ([place])    History reviewed. No pertinent surgical history. Family History: History reviewed. No pertinent family history. Family Psychiatric  History: See H&P Social History:  Social History   Substance and Sexual Activity  Alcohol Use No     Social History   Substance and Sexual Activity  Drug Use Not Currently    Social History   Socioeconomic History  . Marital status: Single    Spouse name: Not on file  . Number of children: Not on file  . Years of education: Not on file  . Highest education level: Not on file  Occupational History  . Not on file  Social Needs  . Financial resource strain: Not on file  . Food insecurity:    Worry: Not on file    Inability: Not on file  . Transportation needs:    Medical: Not on file    Non-medical: Not on file  Tobacco Use  . Smoking status: Former Smoker    Packs/day: 1.00    Types: Cigarettes  . Smokeless tobacco: Never  Used  Substance and Sexual Activity  . Alcohol use: No  . Drug use: Not Currently  . Sexual activity: Not Currently  Lifestyle  . Physical activity:    Days per week: Not on file    Minutes per session: Not on file  . Stress: Not on file  Relationships  . Social connections:    Talks on phone: Not on file    Gets together: Not on file    Attends religious service: Not on file    Active member of club or organization: Not on file    Attends meetings of clubs or organizations: Not on file    Relationship status: Not on file  Other Topics Concern  . Not on file  Social History Narrative  . Not on file   Additional Social History:                         Sleep: Fair  Appetite:  Good  Current Medications: Current Facility-Administered Medications  Medication Dose Route Frequency Provider Last Rate Last Dose  . acetaminophen (TYLENOL) tablet 650 mg  650 mg Oral Q6H PRN Clapacs, John T, MD      . alum & mag hydroxide-simeth (MAALOX/MYLANTA) 200-200-20 MG/5ML suspension 30 mL  30 mL Oral Q4H PRN Clapacs, John T, MD      . benztropine (COGENTIN) tablet 1 mg  1 mg Oral BID PRN McNew, Tyson Babinski, MD   1  mg at 04/08/18 2048   Or  . benztropine mesylate (COGENTIN) injection 1 mg  1 mg Intramuscular BID PRN McNew, Tyson Babinski, MD      . benztropine (COGENTIN) tablet 1 mg  1 mg Oral QHS McNew, Tyson Babinski, MD   1 mg at 04/25/18 2122  . divalproex (DEPAKOTE) DR tablet 1,000 mg  1,000 mg Oral BH-q7a McNew, Tyson Babinski, MD   1,000 mg at 04/26/18 6579   And  . divalproex (DEPAKOTE) DR tablet 750 mg  750 mg Oral QHS McNew, Holly R, MD   750 mg at 04/25/18 2122  . haloperidol (HALDOL) tablet 5 mg  5 mg Oral Q6H PRN McNew, Tyson Babinski, MD       Or  . haloperidol lactate (HALDOL) injection 5 mg  5 mg Intramuscular Q6H PRN McNew, Tyson Babinski, MD   5 mg at 04/01/18 0819  . haloperidol (HALDOL) tablet 5 mg  5 mg Oral QHS McNew, Tyson Babinski, MD   5 mg at 04/25/18 2121  . hydrOXYzine (ATARAX/VISTARIL) tablet 50 mg   50 mg Oral TID PRN Clapacs, Madie Reno, MD   50 mg at 04/23/18 2125  . LORazepam (ATIVAN) tablet 1 mg  1 mg Oral Q6H PRN Marylin Crosby, MD   1 mg at 04/16/18 2134   Or  . LORazepam (ATIVAN) injection 1 mg  1 mg Intramuscular Q6H PRN Marylin Crosby, MD   1 mg at 04/01/18 0820  . magnesium hydroxide (MILK OF MAGNESIA) suspension 30 mL  30 mL Oral Daily PRN Clapacs, Madie Reno, MD      . traZODone (DESYREL) tablet 100 mg  100 mg Oral QHS PRN Clapacs, Madie Reno, MD   100 mg at 04/24/18 2128    Lab Results:  Results for orders placed or performed during the hospital encounter of 03/26/18 (from the past 48 hour(s))  CBC with Differential/Platelet     Status: Abnormal   Collection Time: 04/24/18  5:09 PM  Result Value Ref Range   WBC 4.1 3.8 - 10.6 K/uL   RBC 4.11 (L) 4.40 - 5.90 MIL/uL   Hemoglobin 13.0 13.0 - 18.0 g/dL   HCT 37.5 (L) 40.0 - 52.0 %   MCV 91.3 80.0 - 100.0 fL   MCH 31.6 26.0 - 34.0 pg   MCHC 34.6 32.0 - 36.0 g/dL   RDW 13.7 11.5 - 14.5 %   Platelets 136 (L) 150 - 440 K/uL   Neutrophils Relative % 31 %   Neutro Abs 1.3 (L) 1.4 - 6.5 K/uL   Lymphocytes Relative 48 %   Lymphs Abs 1.9 1.0 - 3.6 K/uL   Monocytes Relative 17 %   Monocytes Absolute 0.7 0.2 - 1.0 K/uL   Eosinophils Relative 4 %   Eosinophils Absolute 0.2 0 - 0.7 K/uL   Basophils Relative 0 %   Basophils Absolute 0.0 0 - 0.1 K/uL    Comment: Performed at [facility] [address] [place], [place] [address]  Valproic acid level     Status: None   Collection Time: 04/24/18  5:09 PM  Result Value Ref Range   Valproic Acid Lvl 61 50.0 - 100.0 ug/mL    Comment: Performed at [facility], [address] [place], [place] [address]    Blood Alcohol level:  Lab Results  Component Value Date   [facility] <10 03/25/2018   ETH <10 19/16/6060    Metabolic Disorder Labs: Lab Results  Component Value Date   HGBA1C  5.4 03/25/2018   MPG 108.28 03/25/2018   No results found for: PROLACTIN Lab Results   Component Value Date   CHOL 184 03/25/2018   TRIG 43 03/25/2018   HDL 50 03/25/2018   CHOLHDL 3.7 03/25/2018   VLDL 9 03/25/2018   LDLCALC 125 (H) 03/25/2018    Physical Findings: AIMS: Facial and Oral Movements Muscles of Facial Expression: None, normal Lips and Perioral Area: None, normal Jaw: None, normal Tongue: None, normal,Extremity Movements Upper (arms, wrists, hands, fingers): None, normal Lower (legs, knees, ankles, toes): None, normal, Trunk Movements Neck, shoulders, hips: None, normal, Overall Severity Severity of abnormal movements (highest score from questions above): None, normal Incapacitation due to abnormal movements: None, normal Patient's awareness of abnormal movements (rate only patient's report): No Awareness, Dental Status Current problems with teeth and/or dentures?: No Does patient usually wear dentures?: No  CIWA:  CIWA-Ar Total: 0 COWS:  COWS Total Score: 0  Musculoskeletal: Strength & Muscle Tone: within normal limits Gait & Station: normal Patient leans: N/A  Psychiatric Specialty Exam: Physical Exam  Nursing note and vitals reviewed.   Review of Systems  All other systems reviewed and are negative.   Blood pressure 93/64, pulse 66, temperature 98.4 F (36.9 C), temperature source Oral, resp. rate 18, height _0  (1.727 m), weight 58.1 kg, SpO2 98 %.Body mass index is 19.46 kg/m.  General Appearance: Casual  Eye Contact:  Fair  Speech:  Clear and Coherent  Volume:  Normal  Mood:  Euthymic  Affect:  Appropriate  Thought Process:  Coherent and Goal Directed  Orientation:  Full (Time, Place, and Person)  Thought Content:  Logical  Suicidal Thoughts:  No  Homicidal Thoughts:  No  Memory:  Immediate;   Fair  Judgement:  Impaired  Insight:  Lacking  Psychomotor Activity:  Normal  Concentration:  Concentration: Fair  Recall:  [employer] of Knowledge:  Fair  Language:  Fair  Akathisia:  No      Assets:  Resilience  ADL's:   Intact  Cognition:  WNL  Sleep:  Number of Hours: 6.3     Treatment Plan Summary: 37 yo male admitted due to psychosis which has greatly improved. We met with his guardian who is working hard on finding placement for him. He will be referred to ACT team when placement is found.   Plan:  Schizoaffective disorder -Next Kirt Boys due on 9/19 -CBC shows downtrending platelet count.  Patient denies any purpura or petechiae. -Continue Depakote 100 mg qam and 750 mg qhs. Depakote level to be drawn tomorrow -Continue Haldol 5 mg qhs  Dispo -Waiting placement to group home. Met with guardian today Ramond Dial, MD 04/26/2018, 2:21 PM

## 2018-04-26 NOTE — Progress Notes (Signed)
D: Pt denies SI/HI/AVH, able to contract for safety. Pt. Continues to present flat and blunted this evening, forwards little and is minimal. Pt. Participation is poor. Pt. Frequently isolative and withdrawn to room.     A: Q x 15 minute observation checks were completed for safety. Patient was provided with education, but shows no evidence of learning, needs reinforcement.  Patient was given/offered medications per orders. Patient  was encourage to attend groups, participate in unit activities and continue with plan of care. Pt. Chart and plans of care reviewed. Pt. Given support and encouragement.   R: Patient is complaint with medication and unit procedures for the most part. Pt. Does continue to refuse vital signs.             Precautionary checks every 15 minutes for safety maintained, room free of safety hazards, patient sustains no injury or falls during this shift. Will endorse care to next shift.

## 2018-04-26 NOTE — [facility] Group Notes (Signed)
LCSW Group Therapy Note 04/26/2018 1:15pm  Type of Therapy and Topic: Group Therapy: Feelings Around Returning Home & Establishing a Supportive Framework and Supporting Oneself When Supports Not Available  Participation Level: Did Not Attend  Description of Group:  Patients first processed thoughts and feelings about upcoming discharge. These included fears of upcoming changes, lack of change, new living environments, judgements and expectations from others and overall stigma of mental health issues. The group then discussed the definition of a supportive framework, what that looks and feels like, and how do to discern it from an unhealthy non-supportive network. The group identified different types of supports as well as what to do when your family/friends are less than helpful or unavailable  Therapeutic Goals  1. Patient will identify one healthy supportive network that they can use at discharge. 2. Patient will identify one factor of a supportive framework and how to tell it from an unhealthy network. 3. Patient able to identify one coping skill to use when they do not have positive supports from others. 4. Patient will demonstrate ability to communicate their needs through discussion and/or role plays.  Summary of Patient Progress:  Pt was invited to attend group but chose not to attend. CSW will continue to encourage pt to attend group throughout their admission.   Therapeutic Modalities Cognitive Behavioral Therapy Motivational Interviewing   Ramy Greth  CUEBAS-COLON, LCSW 04/26/2018 10:07 AM 

## 2018-04-27 LAB — VALPROIC ACID LEVEL: Valproic Acid Lvl: 78 ug/mL (ref 50.0–100.0)

## 2018-04-27 MED ORDER — PALIPERIDONE PALMITATE ER 234 MG/1.5ML IM SUSY
234.0000 mg | PREFILLED_SYRINGE | Freq: Once | INTRAMUSCULAR | Status: AC
  Administered 2018-04-29: 234 mg via INTRAMUSCULAR
  Filled 2018-04-27 (×2): qty 1.5

## 2018-04-27 NOTE — Plan of Care (Signed)
Cooperative and compliant with treatment 

## 2018-04-27 NOTE — Progress Notes (Signed)
Patient ID: David Mulleraul Stephenson Cocozza Jr., male   DOB: 08/10/1981, 37 y.o.   MRN: [id number] Per State regulations 482.30 this chart was reviewed for medical necessity with respect to the patient's admission/duration of stay.    Next review date: 05/01/18   Thurman CoyerEric Kendale Rembold, BSN, RN-BC  Case Manager

## 2018-04-27 NOTE — Progress Notes (Signed)
Patient remained calm and cooperative. Stayed in the milieu but had minimal interactions with staff and peers. Presented to the medication room and received bedtime medications. Had no concerns. Currently in bed sleeping. Safety precautions maintained.

## 2018-04-27 NOTE — Progress Notes (Signed)
[facility] MD Progress Note  04/27/2018 3:45 PM Faythe Casa.  MRN:  [id number] Subjective:  Pt states that he is doing well. HE is eating well. HE has brighter affect. He states that he liked meeting with his guardian on Friday. He is organized in thoughts. HE denies AH, VH, SI, HI. He is tolerating medications well.   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 Undifferentiated schizophrenia ([place]) [F20.3] 03/26/2018    Priority: High  . TBI (traumatic brain injury) ([place]) [S06.9X9A] 07/10/2016  . Vitamin B12 deficiency [E53.8] 07/10/2016  . Noncompliance with medication regimen [Z91.14] 06/25/2016  . Cocaine use disorder, mild, abuse ([place]) [F14.10] 06/25/2016  . Cannabis use disorder, mild, abuse [F12.10] 06/25/2016  . Paranoid schizophrenia ([place]) [F20.0] 06/24/2016   Total Time spent with patient: 20 minutes  Past Psychiatric History: See H&P  Past Medical History:  Past Medical History:  Diagnosis Date  . Acute psychosis ([place])   . Antisocial personality disorder ([place])   . Cannabis abuse    Past  . Paranoid schizophrenia ([place])   . Schizoaffective disorder, bipolar type ([place])    History reviewed. No pertinent surgical history. Family History: History reviewed. No pertinent family history. Family Psychiatric  History: See H&P Social History:  Social History   Substance and Sexual Activity  Alcohol Use No     Social History   Substance and Sexual Activity  Drug Use Not Currently    Social History   Socioeconomic History  . Marital status: Single    Spouse name: Not on file  . Number of children: Not on file  . Years of education: Not on file  . Highest education level: Not on file  Occupational History  . Not on file  Social Needs  . Financial resource strain: Not on file  . Food insecurity:    Worry: Not on file    Inability: Not on file  . Transportation needs:     Medical: Not on file    Non-medical: Not on file  Tobacco Use  . Smoking status: Former Smoker    Packs/day: 1.00    Types: Cigarettes  . Smokeless tobacco: Never Used  Substance and Sexual Activity  . Alcohol use: No  . Drug use: Not Currently  . Sexual activity: Not Currently  Lifestyle  . Physical activity:    Days per week: Not on file    Minutes per session: Not on file  . Stress: Not on file  Relationships  . Social connections:    Talks on phone: Not on file    Gets together: Not on file    Attends religious service: Not on file    Active member of club or organization: Not on file    Attends meetings of clubs or organizations: Not on file    Relationship status: Not on file  Other Topics Concern  . Not on file  Social History Narrative  . Not on file   Additional Social History:                         Sleep: Good  Appetite:  Good  Current Medications: Current Facility-Administered Medications  Medication Dose Route Frequency Provider Last Rate Last Dose  . acetaminophen (TYLENOL) tablet 650 mg  650 mg Oral Q6H PRN Clapacs, John T, MD      . alum & mag hydroxide-simeth (MAALOX/MYLANTA) 200-200-20 MG/5ML suspension 30  mL  30 mL Oral Q4H PRN Clapacs, John T, MD      . benztropine (COGENTIN) tablet 1 mg  1 mg Oral BID PRN Roseana Rhine, Tyson Babinski, MD   1 mg at 04/08/18 2048   Or  . benztropine mesylate (COGENTIN) injection 1 mg  1 mg Intramuscular BID PRN Shimeka Bacot, Tyson Babinski, MD      . benztropine (COGENTIN) tablet 1 mg  1 mg Oral QHS Dhriti Fales, Tyson Babinski, MD   1 mg at 04/26/18 2105  . divalproex (DEPAKOTE) DR tablet 1,000 mg  1,000 mg Oral BH-q7a Maci Eickholt, Tyson Babinski, MD   1,000 mg at 04/27/18 9628   And  . divalproex (DEPAKOTE) DR tablet 750 mg  750 mg Oral QHS Nekita Pita R, MD   750 mg at 04/26/18 2104  . haloperidol (HALDOL) tablet 5 mg  5 mg Oral Q6H PRN Becca Bayne, Tyson Babinski, MD       Or  . haloperidol lactate (HALDOL) injection 5 mg  5 mg Intramuscular Q6H PRN Janeth Terry, Tyson Babinski,  MD   5 mg at 04/01/18 0819  . haloperidol (HALDOL) tablet 5 mg  5 mg Oral QHS Aubryn Spinola, Tyson Babinski, MD   5 mg at 04/26/18 2104  . hydrOXYzine (ATARAX/VISTARIL) tablet 50 mg  50 mg Oral TID PRN Clapacs, Madie Reno, MD   50 mg at 04/23/18 2125  . LORazepam (ATIVAN) tablet 1 mg  1 mg Oral Q6H PRN Marylin Crosby, MD   1 mg at 04/16/18 2134   Or  . LORazepam (ATIVAN) injection 1 mg  1 mg Intramuscular Q6H PRN Marylin Crosby, MD   1 mg at 04/01/18 0820  . magnesium hydroxide (MILK OF MAGNESIA) suspension 30 mL  30 mL Oral Daily PRN Clapacs, Madie Reno, MD      . Derrill Memo ON 04/29/2018] paliperidone (INVEGA SUSTENNA) injection 234 mg  234 mg Intramuscular Once Greg Eckrich, Tyson Babinski, MD      . traZODone (DESYREL) tablet 100 mg  100 mg Oral QHS PRN Clapacs, Madie Reno, MD   100 mg at 04/24/18 2128    Lab Results:  Results for orders placed or performed during the hospital encounter of 03/26/18 (from the past 48 hour(s))  Valproic acid level     Status: None   Collection Time: 04/27/18  7:05 AM  Result Value Ref Range   Valproic Acid Lvl 78 50.0 - 100.0 ug/mL    Comment: Performed at [facility], [address] [place], [place] [address]    Blood Alcohol level:  Lab Results  Component Value Date   [facility] <10 03/25/2018   ETH <10 47/65/4650    Metabolic Disorder Labs: Lab Results  Component Value Date   HGBA1C 5.4 03/25/2018   MPG 108.28 03/25/2018   No results found for: PROLACTIN Lab Results  Component Value Date   CHOL 184 03/25/2018   TRIG 43 03/25/2018   HDL 50 03/25/2018   CHOLHDL 3.7 03/25/2018   VLDL 9 03/25/2018   LDLCALC 125 (H) 03/25/2018    Physical Findings: AIMS: Facial and Oral Movements Muscles of Facial Expression: None, normal Lips and Perioral Area: None, normal Jaw: None, normal Tongue: None, normal,Extremity Movements Upper (arms, wrists, hands, fingers): None, normal Lower (legs, knees, ankles, toes): None, normal, Trunk Movements Neck, shoulders, hips: None, normal,  Overall Severity Severity of abnormal movements (highest score from questions above): None, normal Incapacitation due to abnormal movements: None, normal Patient's awareness of abnormal movements (rate only patient's report): No  Awareness, Dental Status Current problems with teeth and/or dentures?: No Does patient usually wear dentures?: No  CIWA:  CIWA-Ar Total: 0 COWS:  COWS Total Score: 0  Musculoskeletal: Strength & Muscle Tone: within normal limits Gait & Station: normal Patient leans: N/A  Psychiatric Specialty Exam: Physical Exam  Nursing note and vitals reviewed.   Review of Systems  All other systems reviewed and are negative.   Blood pressure 93/64, pulse 66, temperature 98.4 F (36.9 C), temperature source Oral, resp. rate 18, height '5\' 8"'  (1.727 m), weight 58.1 kg, SpO2 98 %.Body mass index is 19.46 kg/m.  General Appearance: Casual  Eye Contact:  Fair  Speech:  Clear and Coherent  Volume:  Normal  Mood:  Euthymic  Affect:  Appropriate  Thought Process:  Coherent and Goal Directed  Orientation:  Full (Time, Place, and Person)  Thought Content:  Logical  Suicidal Thoughts:  No  Homicidal Thoughts:  No  Memory:  Immediate;   Fair  Judgement:  Fair  Insight:  Lacking  Psychomotor Activity:  Normal  Concentration:  Concentration: Fair  Recall:  [employer] of Knowledge:  Fair  Language:  Fair  Akathisia:  No      Assets:  Resilience  ADL's:  Intact  Cognition:  WNL  Sleep:  Number of Hours: 6.15     Treatment Plan Summary: 37 yo male admitted due to psychosis and aggression. He is much improved and has not had any episodes of agitation. He is due for his next [place] injection this week. Repeat CBC shows improved WBC and stable ANC.   Plan:  Schizoaffective disorder -Next Invega injection will be given on Wednesday -Continue Depakote 1000 mg qam and 750 mg qhs. Level today was 68  Dispo -Waiting placement to group home. Met with guardian on  Friday   Marylin Crosby, MD 04/27/2018, 3:45 PM

## 2018-04-27 NOTE — Plan of Care (Signed)
Patient isolates in his room most of the time.Patient was reading something in paper he wrote.When staff asked patient states "I am reading what I wrote about [employer]."Patient was pleasant and interested to talk about this.Denies SI,HI and AVH.Did not attend in groups.Appetite and energy level good.Support and encouragement given.

## 2018-04-27 NOTE — Progress Notes (Signed)
Recreation Therapy Notes  Date: 04/27/2018  Time: 9:30 pm   Location: Craft Room   Behavioral response: N/A   Intervention Topic: Communication  Discussion/Intervention: Patient did not attend group.   Clinical Observations/Feedback:  Patient did not attend group.   Brandelyn Henne LRT/CTRS         David Davila 04/27/2018 11:56 AM 

## 2018-04-27 NOTE — B[facility] Group Notes (Signed)
[facility] Group Notes:  (Nursing/MHT/Case Management/Adjunct)  Date:  04/27/2018  Time:  11:25 PM  Type of Therapy:  Group Therapy  Participation Level:  Active  Participation Quality:  Appropriate  Affect:  Appropriate  Cognitive:  Appropriate  Insight:  Appropriate  Engagement in Group:  Engaged  Modes of Intervention:  Discussion  Summary of Progress/Problems:  David EkJanice Marie [name] 04/27/2018, 11:25 PM

## 2018-04-28 NOTE — B[facility] Group Notes (Signed)
04/28/2018 1PM  Type of Therapy/Topic:  Group Therapy:  Feelings about Diagnosis  Participation Level:  None   Description of Group:   This group will allow patients to explore their thoughts and feelings about diagnoses they have received. Patients will be guided to explore their level of understanding and acceptance of these diagnoses. Facilitator will encourage patients to process their thoughts and feelings about the reactions of others to their diagnosis and will guide patients in identifying ways to discuss their diagnosis with significant others in their lives. This group will be process-oriented, with patients participating in exploration of their own experiences, giving and receiving support, and processing challenge from other group members.   Therapeutic Goals: 1. Patient will demonstrate understanding of diagnosis as evidenced by identifying two or more symptoms of the disorder 2. Patient will be able to express two feelings regarding the diagnosis 3. Patient will demonstrate their ability to communicate their needs through discussion and/or role play  Summary of Patient Progress: Patient came to the group. He did not verbally participate but did seem attentive to the topic. Patient left the group early.      Therapeutic Modalities:   Cognitive Behavioral Therapy Brief Therapy Feelings Identification    Johny ShearsCassandra  Seniyah Esker, LCSW 04/28/2018 2:30 PM

## 2018-04-28 NOTE — B[facility] Counselor (Signed)
CSW called [facility] to follow up on referral. She reports that she doesn't have any male beds available at the time.   Johny Shearsassandra Kay Shippy, MSW, Theresia MajorsLCSWA, Bridget HartshornLCASA Clinical Social Worker 04/28/2018 3:28 PM

## 2018-04-28 NOTE — B[facility] Group Notes (Signed)
[facility] Group Notes:  (Nursing/MHT/Case Management/Adjunct)  Date:  04/28/2018  Time:  10:56 PM  Type of Therapy:  Group Therapy  Participation Level:  Minimal    Summary of Progress/Problems: [name] was in and out of group. [name] did not engage. [name] stated he had a good day.  Jinger NeighborsKeith D Selmer Adduci 04/28/2018, 10:56 PM

## 2018-04-28 NOTE — Plan of Care (Signed)
Patient in bed majority of the shift resting with eyes closed. Remains isolative intermittently interacting with select peers. Up for meals in the milieu, denies SI/HI/AVH and pain. Milieu remains safe with q 15  minute safety checks.

## 2018-04-28 NOTE — B[facility] Counselor (Signed)
CSW spoke with the patients guarding to coordinate discharge plans and discuss any placement update. No options have been secured for the patient. She will call and follow up with her referrals this week. She did provide the patients mothers contact information to gibe to the patient. His mothers name is David Davila [phone]. [place] in [place]. There is a note in his chart that he can not go live with his mother. CSW will provide the patient his mothers contact number.  David Davila, MSW, Theresia MajorsLCSWA, Bridget HartshornLCASA Clinical Social Worker 04/28/2018 3:45 PM

## 2018-04-28 NOTE — Progress Notes (Signed)
Recreation Therapy Notes  Date: 04/28/2018  Time: 9:30 pm   Location: Craft Room   Behavioral response: N/A   Intervention Topic: Happiness  Discussion/Intervention: Patient did not attend group.   Clinical Observations/Feedback:  Patient did not attend group.   Clayton Bosserman LRT/CTRS        Yovanni Frenette 04/28/2018 12:41 PM

## 2018-04-28 NOTE — Progress Notes (Signed)
[facility]  04/28/2018 2:22 PM David MullerPaul Davila David Davila.  MRN:  [id number] Subjective:  Pt has been calm and cooperative. HE denies any concerns. HE denies SI, HI, AH, VH. He is eating well. He isolates to his room mostly. He agrees to take his Invega injection tomorrow. He is pleasant on interaction.   Principal Problem: Undifferentiated schizophrenia (HCC) Diagnosis:   Patient Active Problem List   Diagnosis Date Noted  . Undifferentiated schizophrenia (HCC) [F20.3] 03/26/2018    Priority: High  . TBI (traumatic brain injury) (HCC) [S06.9X9A] 07/10/2016  . Vitamin B12 deficiency [E53.8] 07/10/2016  . Noncompliance with medication regimen [Z91.14] 06/25/2016  . Cocaine use disorder, mild, abuse (HCC) [F14.10] 06/25/2016  . Cannabis use disorder, mild, abuse [F12.10] 06/25/2016  . 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 Acute psychosis (HCC)   . Antisocial personality disorder (HCC)   . Cannabis abuse    Past  . Paranoid schizophrenia (HCC)   . Schizoaffective disorder, bipolar type (HCC)    History reviewed. No pertinent surgical history. Family History: History reviewed. No pertinent family history. Family Psychiatric  History: See h&P Social History:  Social History   Substance and Sexual Activity  Alcohol Use No     Social History   Substance and Sexual Activity  Drug Use Not Currently    Social History   Socioeconomic History  . Marital status: Single    Spouse name: Not on file  . Number of children: Not on file  . Years of education: Not on file  . Highest education level: Not on file  Occupational History  . Not on file  Social Needs  . Financial resource strain: Not on file  . Food insecurity:    Worry: Not on file    Inability: Not on file  . Transportation needs:    Medical: Not on  file    Non-medical: Not on file  Tobacco Use  . Smoking status: Former Smoker    Packs/day: 1.00    Types: Cigarettes  . Smokeless tobacco: Never Used  Substance and Sexual Activity  . Alcohol use: No  . Drug use: Not Currently  . Sexual activity: Not Currently  Lifestyle  . Physical activity:    Days per week: Not on file    Minutes per session: Not on file  . Stress: Not on file  Relationships  . Social connections:    Talks on phone: Not on file    Gets together: Not on file    Attends religious service: Not on file    Active member of club or organization: Not on file    Attends meetings of clubs or organizations: Not on file    Relationship status: Not on file  Other Topics Concern  . Not on file  Social History Narrative  . Not on file   Additional Social History:                         Sleep: Fair  Appetite:  Good  Current Medications: Current Facility-Administered Medications  Medication Dose Route Frequency Provider Last Rate Last Dose  . acetaminophen (TYLENOL) tablet 650 mg  650 mg Oral Q6H PRN Clapacs, John T, MD      . alum & mag hydroxide-simeth (MAALOX/MYLANTA) 200-200-20 MG/5ML suspension 30 mL  30  mL Oral Q4H PRN Clapacs, John T, MD      . benztropine (COGENTIN) tablet 1 mg  1 mg Oral BID PRN Cristina Mattern, Ileene Hutchinson, MD   1 mg at 04/08/18 2048   Or  . benztropine mesylate (COGENTIN) injection 1 mg  1 mg Intramuscular BID PRN Eulia Hatcher, Ileene Hutchinson, MD      . benztropine (COGENTIN) tablet 1 mg  1 mg Oral QHS Nithila Sumners, Ileene Hutchinson, MD   1 mg at 04/27/18 2140  . divalproex (DEPAKOTE) DR tablet 1,000 mg  1,000 mg Oral BH-q7a Brnadon Eoff, Ileene Hutchinson, MD   1,000 mg at 04/28/18 0630   And  . divalproex (DEPAKOTE) DR tablet 750 mg  750 mg Oral QHS Ernestyne Caldwell R, MD   750 mg at 04/27/18 2140  . haloperidol (HALDOL) tablet 5 mg  5 mg Oral Q6H PRN Gracelin Weisberg, Ileene Hutchinson, MD       Or  . haloperidol lactate (HALDOL) injection 5 mg  5 mg Intramuscular Q6H PRN Sharonann Malbrough, Ileene Hutchinson, MD   5 mg at  04/01/18 0819  . haloperidol (HALDOL) tablet 5 mg  5 mg Oral QHS Marketia Stallsmith, Ileene Hutchinson, MD   5 mg at 04/27/18 2140  . hydrOXYzine (ATARAX/VISTARIL) tablet 50 mg  50 mg Oral TID PRN Clapacs, Jackquline Denmark, MD   50 mg at 04/23/18 2125  . LORazepam (ATIVAN) tablet 1 mg  1 mg Oral Q6H PRN Haskell Riling, MD   1 mg at 04/16/18 2134   Or  . LORazepam (ATIVAN) injection 1 mg  1 mg Intramuscular Q6H PRN Haskell Riling, MD   1 mg at 04/01/18 0820  . magnesium hydroxide (MILK OF MAGNESIA) suspension 30 mL  30 mL Oral Daily PRN Clapacs, Jackquline Denmark, MD      . David Davila ON 04/29/2018] paliperidone (INVEGA SUSTENNA) injection 234 mg  234 mg Intramuscular Once Yuleidy Rappleye, Ileene Hutchinson, MD      . traZODone (DESYREL) tablet 100 mg  100 mg Oral QHS PRN Clapacs, Jackquline Denmark, MD   100 mg at 04/24/18 2128    Lab Results:  Results for orders placed or performed during the hospital encounter of 03/26/18 (from the past 48 hour(s))  Valproic acid level     Status: None   Collection Time: 04/27/18  7:05 AM  Result Value Ref Range   Valproic Acid Lvl 78 50.0 - 100.0 ug/mL    Comment: Performed at [facility], [address] [place], [place] [address]    Blood Alcohol level:  Lab Results  Component Value Date   [facility] <10 03/25/2018   ETH <10 12/06/2017    Metabolic Disorder Labs: Lab Results  Component Value Date   HGBA1C 5.4 03/25/2018   MPG 108.28 03/25/2018   No results found for: PROLACTIN Lab Results  Component Value Date   CHOL 184 03/25/2018   TRIG 43 03/25/2018   HDL 50 03/25/2018   CHOLHDL 3.7 03/25/2018   VLDL 9 03/25/2018   LDLCALC 125 (H) 03/25/2018    Physical Findings: AIMS: Facial and Oral Movements Muscles of Facial Expression: None, normal Lips and Perioral Area: None, normal Jaw: None, normal Tongue: None, normal,Extremity Movements Upper (arms, wrists, hands, fingers): None, normal Lower (legs, knees, ankles, toes): None, normal, Trunk Movements Neck, shoulders, hips: None, normal, Overall  Severity Severity of abnormal movements (highest score from questions above): None, normal Incapacitation due to abnormal movements: None, normal Patient's awareness of abnormal movements (rate only patient's report): No Awareness, Dental Status  Current problems with teeth and/or dentures?: No Does patient usually wear dentures?: No  CIWA:  CIWA-Ar Total: 0 COWS:  COWS Total Score: 0  Musculoskeletal: Strength & Muscle Tone: within normal limits Gait & Station: normal Patient leans: N/A  Psychiatric Specialty Exam: Physical Exam  Nursing note and vitals reviewed.   Review of Systems  All other systems reviewed and are negative.   Blood pressure 102/68, pulse 82, temperature 98 F (36.7 C), temperature source Oral, resp. rate 18, height 5\' 8"  (1.727 m), weight 58.1 kg, SpO2 96 %.Body mass index is 19.46 kg/m.  General Appearance: Casual  Eye Contact:  Fair  Speech:  Clear and Coherent  Volume:  Normal  Mood:  Euthymic  Affect:  Appropriate  Thought Process:  Coherent and Goal Directed  Orientation:  Full (Time, Place, and Person)  Thought Content:  Logical  Suicidal Thoughts:  No  Homicidal Thoughts:  No  Memory:  Immediate;   Fair  Judgement:  Impaired  Insight:  Lacking  Psychomotor Activity:  Normal  Concentration:  Concentration: Fair  Recall:  [employer] of Knowledge:  Fair  Language:  Fair  Akathisia:  No      Assets:  Resilience  ADL's:  Intact  Cognition:  WNL  Sleep:  Number of Hours: 6.3     Treatment Plan Summary: 37 yo male admitted due to aggression and agitation. He has been calm and appropriate. Much less delusional and paranoid. His next INvega dose is due tomorrow.   Plan:  Schizoaffective disorder -Next [place] injection 234 mg due tomorrow -Continue Depakote 1000 mg qam and 750 mg qhs. Level 78  Dispo -Waiting for group home placement  Haskell Riling, MD 04/28/2018, 2:22 PM

## 2018-04-28 NOTE — Plan of Care (Signed)
Pt. Complaint with medications this evening. Pt. Able to deny si/hi and contract for safety. Pt. Monitored by staff for safety. Pt. Continues to present pacing around the unit responding to internal stimuli. Pt. Laughs to himself and when this Clinical research associatewriter or staff attempt to engage the pt. He is distracted by internal stimuli. Pt. When not pacing around the unit is isolative and withdrawn to his room like previous nights. Pt. Pleasant for the most part.    Problem: Education: Goal: Emotional status will improve Outcome: Not Progressing Goal: Mental status will improve Outcome: Not Progressing   Problem: Education: Goal: Will be free of psychotic symptoms Outcome: Not Progressing   Problem: Health Behavior/Discharge Planning: Goal: Compliance with treatment plan for underlying cause of condition will improve Outcome: Progressing   Problem: Safety: Goal: Periods of time without injury will increase Outcome: Progressing   Problem: Safety: Goal: Ability to remain free from injury will improve Outcome: Progressing

## 2018-04-28 NOTE — Progress Notes (Signed)
D: Pt denies SI/HI/AVH, able to contract for safety. Pt is pleasant and cooperative for the most part, but very minimal and forwards almost no information, just nods his head to answer most of the time. Pt. has no Complaints.  Patient Interaction minimal. Pt. Continues to present pacing around the unit responding to internal stimuli. Pt. Laughs to himself and when this Clinical research associatewriter or staff attempt to engage the pt. He is distracted by internal stimuli. Pt. When not pacing around the unit is isolative and withdrawn to his room like previous nights responding. Pt. Pleasant for the most part.   A: Q x 15 minute observation checks were completed for safety. Patient was provided with education, but refuses.  Patient was given/offered medications per orders. Patient  was encourage to attend groups, participate in unit activities and continue with plan of care. Pt. Chart and plans of care reviewed. Pt. Given support and encouragement.   R: Patient is complaint with medication and unit procedures, but continues to refuse vital signs this evening.             Precautionary checks every 15 minutes for safety maintained, room free of safety hazards, patient sustains no injury or falls during this shift. Will endorse care to next shift.

## 2018-04-29 NOTE — Plan of Care (Signed)
Pt. Verbalizes understanding of provided education and is much more receptive, but needs some reinforcement still. Pt. Affect is much brighter this evening, smiles occasionally during assessments. Pt. Speaking to this writer a lot more during assessments, forwarding more information. Pt. Participates in groups and attends snacks. Pt. Complaint with medications. Pt. Denies Si/HI and able to contract verbally for safety. Pt. Responding to internal stimuli less this evening, but continues to pace around the unit.    Problem: Education: Goal: Knowledge of  General Education information/materials will improve Outcome: Progressing Goal: Emotional status will improve Outcome: Progressing Goal: Mental status will improve Outcome: Progressing   Problem: Activity: Goal: Interest or engagement in activities will improve Outcome: Progressing   Problem: Health Behavior/Discharge Planning: Goal: Compliance with treatment plan for underlying cause of condition will improve Outcome: Progressing   Problem: Safety: Goal: Periods of time without injury will increase Outcome: Progressing   Problem: Education: Goal: Will be free of psychotic symptoms Outcome: Progressing   Problem: Role Relationship: Goal: Ability to interact with others will improve Outcome: Progressing   Problem: Safety: Goal: Ability to remain free from injury will improve Outcome: Progressing   Problem: Safety: Goal: Ability to remain free from injury will improve Outcome: Progressing

## 2018-04-29 NOTE — Progress Notes (Signed)
D: Pt denies SI/HI/AVH, able to contract for safety. Pt. Responding less to internal stimuli this evening. Pt. Affect is much brighter this evening, smiles occasionally during assessments. Pt. Speaking to this writer a lot more during assessments, forwarding more information. Pt. Participates in groups and attends snacks. Pt. Continues to pace up and down the hallway socializing with another patient on the unit, but smiling periodically laughing cheerful this evening.   A: Q x 15 minute observation checks were completed for safety. Patient was provided with education, but needs some reinforcement still. Patient was given/offered medications per orders. Patient  was encourage to attend groups, participate in unit activities and continue with plan of care. Pt. Chart and plans of care reviewed. Pt. Given support and encouragement.   R: Patient is complaint with medication and unit procedures.             Precautionary checks every 15 minutes for safety maintained, room free of safety hazards, patient sustains no injury or falls during this shift. Will endorse care to next shift.
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
## 2018-04-29 NOTE — Tx Team (Signed)
Interdisciplinary Treatment and Diagnostic Plan Update  04/29/2018 Time of Session: 11:30am David Davila. MRN: [id number]  Principal Diagnosis: Undifferentiated schizophrenia (HCC)  Secondary Diagnoses: Principal Problem:   Undifferentiated schizophrenia (HCC)   Current Medications:  Current Facility-Administered Medications  Medication Dose Route Frequency Provider Last Rate Last Dose  . acetaminophen (TYLENOL) tablet 650 mg  650 mg Oral Q6H PRN Clapacs, John T, MD      . alum & mag hydroxide-simeth (MAALOX/MYLANTA) 200-200-20 MG/5ML suspension 30 mL  30 mL Oral Q4H PRN Clapacs, John T, MD      . benztropine (COGENTIN) tablet 1 mg  1 mg Oral BID PRN McNew, Ileene Hutchinson, MD   1 mg at 04/08/18 2048   Or  . benztropine mesylate (COGENTIN) injection 1 mg  1 mg Intramuscular BID PRN McNew, Ileene Hutchinson, MD      . benztropine (COGENTIN) tablet 1 mg  1 mg Oral QHS McNew, Ileene Hutchinson, MD   1 mg at 04/28/18 2137  . divalproex (DEPAKOTE) DR tablet 1,000 mg  1,000 mg Oral BH-q7a McNew, Ileene Hutchinson, MD   1,000 mg at 04/29/18 4098   And  . divalproex (DEPAKOTE) DR tablet 750 mg  750 mg Oral QHS McNew, Holly R, MD   750 mg at 04/28/18 2136  . haloperidol (HALDOL) tablet 5 mg  5 mg Oral Q6H PRN McNew, Ileene Hutchinson, MD       Or  . haloperidol lactate (HALDOL) injection 5 mg  5 mg Intramuscular Q6H PRN McNew, Ileene Hutchinson, MD   5 mg at 04/01/18 0819  . haloperidol (HALDOL) tablet 5 mg  5 mg Oral QHS McNew, Ileene Hutchinson, MD   5 mg at 04/28/18 2136  . hydrOXYzine (ATARAX/VISTARIL) tablet 50 mg  50 mg Oral TID PRN Audery Amel, MD   50 mg at 04/23/18 2125  . LORazepam (ATIVAN) tablet 1 mg  1 mg Oral Q6H PRN Haskell Riling, MD   1 mg at 04/16/18 2134   Or  . LORazepam (ATIVAN) injection 1 mg  1 mg Intramuscular Q6H PRN Haskell Riling, MD   1 mg at 04/01/18 0820  . magnesium hydroxide (MILK OF MAGNESIA) suspension 30 mL  30 mL Oral Daily PRN Clapacs, John T, MD      . traZODone (DESYREL) tablet 100 mg  100 mg Oral QHS PRN  Clapacs, Jackquline Denmark, MD   100 mg at 04/24/18 2128   PTA Medications: Medications Prior to Admission  Medication Sig Dispense Refill Last Dose  . atorvastatin (LIPITOR) 10 MG tablet Take 1 tablet (10 mg total) by mouth daily at 6 PM. (Patient not taking: Reported on 11/16/2017) 30 tablet 0 Not Taking at Unknown time  . divalproex (DEPAKOTE ER) 500 MG 24 hr tablet Take 1,000 mg by mouth daily.   unknown at unknown  . hydrOXYzine (ATARAX/VISTARIL) 25 MG tablet Take 1 tablet (25 mg total) by mouth 3 (three) times daily. (Patient not taking: Reported on 11/16/2017) 30 tablet 0 Not Taking at Unknown time  . lamoTRIgine (LAMICTAL) 100 MG tablet Take 1 tablet (100 mg total) by mouth every evening. (Patient not taking: Reported on 11/16/2017) 30 tablet 0 Not Taking at Unknown time  . lamoTRIgine (LAMICTAL) 25 MG tablet Take 2 tablets (50 mg total) by mouth daily. (Patient not taking: Reported on 11/16/2017) 60 tablet 0 Not Taking at Unknown time  . loratadine (CLARITIN) 10 MG tablet Take 10 mg by mouth daily.   unknown at unknown  .  LORazepam (ATIVAN) 0.5 MG tablet Take 1 tablet (0.5 mg total) by mouth 2 (two) times daily. (Patient not taking: Reported on 11/16/2017) 60 tablet 0 Not Taking at Unknown time  . OLANZapine zydis (ZYPREXA) 5 MG disintegrating tablet Take 1 tablet (5 mg total) by mouth at bedtime. (Patient not taking: Reported on 11/16/2017) 30 tablet 0 Not Taking at Unknown time  . paliperidone (INVEGA SUSTENNA) 156 MG/ML SUSP injection Inject 1 mL (156 mg total) into the muscle every 28 (twenty-eight) days. NEXT DOSE DUE August 13, 2016 (Patient taking differently: Inject 156 mg into the muscle every 28 (twenty-eight) days. ) 0.9 mL 0   . traZODone (DESYREL) 100 MG tablet Take 1 tablet (100 mg total) by mouth at bedtime. (Patient taking differently: Take 50 mg by mouth at bedtime. ) 30 tablet 0 Not Taking at Unknown time    Patient Stressors: Medication change or noncompliance Substance abuse  Patient  Strengths: General fund of knowledge  Treatment Modalities: Medication Management, Group therapy, Case management,  1 to 1 session with clinician, Psychoeducation, Recreational therapy.   Physician Treatment Plan for Primary Diagnosis: Undifferentiated schizophrenia (HCC) Long Term Goal(s): Improvement in symptoms so as ready for discharge   Short Term Goals: Ability to identify changes in lifestyle to reduce recurrence of condition will improve  Medication Management: Evaluate patient's response, side effects, and tolerance of medication regimen.  Therapeutic Interventions: 1 to 1 sessions, Unit Group sessions and Medication administration.  Evaluation of Outcomes: Progressing   9/9:Schizoaffective disorder -Next [place] Sustenna injection due on 04/30/18 -Continue Depakote 1000 mg qam and 750 mg qhs -Continue Haldol 5 mg dailyPt is in good mood today. David Davila is smiling and less irritable. David Davila is hoping to get an interview with a group home so David Davila can get out of the hospital. David Davila states taht David Davila is feeling good. David Davila states, "I'm not talking to myself anymore."    Physician Treatment Plan for Secondary Diagnosis: Principal Problem:   Undifferentiated schizophrenia (HCC)  Long Term Goal(s): Improvement in symptoms so as ready for discharge   Short Term Goals: Ability to identify changes in lifestyle to reduce recurrence of condition will improve     Medication Management: Evaluate patient's response, side effects, and tolerance of medication regimen.  Therapeutic Interventions: 1 to 1 sessions, Unit Group sessions and Medication administration.  Evaluation of Outcomes: Progressing   RN Treatment Plan for Primary Diagnosis: Undifferentiated schizophrenia (HCC) Long Term Goal(s): Knowledge of disease and therapeutic regimen to maintain health will improve  Short Term Goals: Ability to verbalize feelings will improve, Ability to identify and develop effective coping behaviors will improve  and Compliance with prescribed medications will improve  Medication Management: RN will administer medications as ordered by provider, will assess and evaluate patient's response and provide education to patient for prescribed medication. RN will report any adverse and/or side effects to prescribing provider.  Therapeutic Interventions: 1 on 1 counseling sessions, Psychoeducation, Medication administration, Evaluate responses to treatment, Monitor vital signs and CBGs as ordered, Perform/monitor CIWA, COWS, AIMS and Fall Risk screenings as ordered, Perform wound care treatments as ordered.  Evaluation of Outcomes: Progressing   LCSW Treatment Plan for Primary Diagnosis: Undifferentiated schizophrenia (HCC) Long Term Goal(s): Safe transition to appropriate next level of care at discharge, Engage patient in therapeutic group addressing interpersonal concerns.  Short Term Goals: Engage patient in aftercare planning with referrals and resources, Increase social support, Increase emotional regulation, Identify triggers associated with mental health/substance abuse issues and Increase skills for  wellness and recovery  Therapeutic Interventions: Assess for all discharge needs, 1 to 1 time with Social worker, Explore available resources and support systems, Assess for adequacy in community support network, Educate family and significant other(s) on suicide prevention, Complete Psychosocial Assessment, Interpersonal group therapy.  Evaluation of Outcomes: Progressing   Progress in Treatment: Attending groups: Yes. Participating in groups: Yes. Taking medication as prescribed: Yes. Toleration medication: Yes. Family/Significant other contact made: Yes, individual(s) contacted:  Patients Guardian Patient understands diagnosis: Yes. Discussing patient identified problems/goals with staff: Yes. Medical problems stabilized or resolved: Yes. Denies suicidal/homicidal ideation: Yes. Issues/concerns per  patient self-inventory: No. Other:   New problem(s) identified: No, Describe:  None  New Short Term/Long Term Goal(s): "Take care of my family."  Patient Goals:   "Take care of my family."  Discharge Plan or Barriers: To discharge to a new group home and engage with outpatient services or ACT Team  Reason for Continuation of Hospitalization: Medication stabilization  Estimated Length of Stay: 5-7 days Recreational Therapy: Patient Stressors: N/A Patient Goal: Patient will engage in interactions with peers and staff in pro-social manner at least 2x within 5 recreation therapy group sessions   Attendees: Patient:  04/29/2018 11:40 AM  Physician: Corinna Gab, MD 04/29/2018 11:40 AM  Nursing: Hulan Amato, RN 04/29/2018 11:40 AM  RN Care Manager: 04/29/2018 11:40 AM  Social Worker: Johny Shears, LCSWA 04/29/2018 11:40 AM  Recreational Therapist: Danella Deis. Outlaw CTRS, LRT 04/29/2018 11:40 AM  Other: Huey Romans, LCSW 04/29/2018 11:40 AM  Other: Damian Leavell, Chaplin 04/29/2018 11:40 AM  Other: 04/29/2018 11:40 AM    Scribe for Treatment Team: Johny Shears, LCSW 04/29/2018 11:40 AM

## 2018-04-29 NOTE — Progress Notes (Signed)
Recreation Therapy Notes  Date: 04/29/2018  Time: 9:30 pm   Location: Craft Room   Behavioral response: N/A   Intervention Topic: Teamwork  Discussion/Intervention: Patient did not attend group.   Clinical Observations/Feedback:  Patient did not attend group.   Armonte Tortorella LRT/CTRS        Draya Felker 04/29/2018 10:19 AM 

## 2018-04-29 NOTE — Plan of Care (Signed)
Patient has verbalized understanding of his prescribed therapeutic regimen as well as the general information that's been provided to him and all questions and concerns have been addressed and answered at this time. Patient denies SI/HI/AVH as well as any signs/symptoms of depression and anxiety. Patient was observed out in the milieu more talking with a student nurse for quite some time. Patient has the ability to verbalize his anger and frustrations into appropriate behaviors and has been demonstrating self-control on the unit. Patient has the ability to identify the available resources that can assist him in meeting his health-care needs and he had a visit with a potential group home that appeared to go really well. Patient's spirits seemed to be uplifted after this visit. Patient has been working towards maintaining his clinical measurements within normal limits and has achieved adequate nutrition. Patient participates in self-care when appropriate for him. Patient has been free from injury thus far and remains safe on this unit at this time.  Problem: Education: Goal: Knowledge of [facility] Education information/materials will improve Outcome: Progressing Goal: Emotional status will improve Outcome: Progressing Goal: Mental status will improve Outcome: Progressing Goal: Verbalization of understanding the information provided will improve Outcome: Progressing   Problem: Activity: Goal: Interest or engagement in activities will improve Outcome: Progressing Goal: Sleeping patterns will improve Outcome: Progressing   Problem: Coping: Goal: Ability to verbalize frustrations and anger appropriately will improve Outcome: Progressing Goal: Ability to demonstrate self-control will improve Outcome: Progressing   Problem: Health Behavior/Discharge Planning: Goal: Identification of resources available to assist in meeting health care needs will improve Outcome: Progressing Goal:  Compliance with treatment plan for underlying cause of condition will improve Outcome: Progressing   Problem: Physical Regulation: Goal: Ability to maintain clinical measurements within normal limits will improve Outcome: Progressing   Problem: Safety: Goal: Periods of time without injury will increase Outcome: Progressing   Problem: Activity: Goal: Will verbalize the importance of balancing activity with adequate rest periods Outcome: Progressing   Problem: Education: Goal: Will be free of psychotic symptoms Outcome: Progressing Goal: Knowledge of the prescribed therapeutic regimen will improve Outcome: Progressing   Problem: Coping: Goal: Coping ability will improve Outcome: Progressing Goal: Will verbalize feelings Outcome: Progressing   Problem: Health Behavior/Discharge Planning: Goal: Compliance with prescribed medication regimen will improve Outcome: Progressing   Problem: Nutritional: Goal: Ability to achieve adequate nutritional intake will improve Outcome: Progressing   Problem: Role Relationship: Goal: Ability to communicate needs accurately will improve Outcome: Progressing Goal: Ability to interact with others will improve Outcome: Progressing   Problem: Safety: Goal: Ability to redirect hostility and anger into socially appropriate behaviors will improve Outcome: Progressing Goal: Ability to remain free from injury will improve Outcome: Progressing   Problem: Self-Care: Goal: Ability to participate in self-care as condition permits will improve Outcome: Progressing   Problem: Self-Concept: Goal: Will verbalize positive feelings about self Outcome: Progressing   Problem: Safety: Goal: Ability to remain free from injury will improve Outcome: Progressing

## 2018-04-29 NOTE — BHH Counselor (Signed)
CSW arranged for the patient to meet with a provider from [facility]([place]) this morning at 9:30am. The patient completed the interview with the placement. Placement informed CSW that the patient was accepted and they will start working on placement set-up and paperwork. They are hoping to get the patient moved in by May 12, 2018. CSW informed treatment team, guardian and patient of the acceptance. CSW will arrange hospital follow up with [facility] until [facility] [place].  Johny Shearsassandra Jayne Peckenpaugh, MSW, Theresia MajorsLCSWA, Bridget HartshornLCASA Clinical Social Worker 04/29/2018 11:38 AM

## 2018-04-29 NOTE — Progress Notes (Signed)
[facility] MD Progress Note  04/29/2018 2:21 PM David Davila.  MRN:  [id number] Subjective:  Pt is out of his room and social with staff today. He is laughing and smiling appropriately. He had interview today with a potential group home and went well. HE was accepted into the group home. HE was very happy to hear this. He willingly took his [place] injection with no issues. He denies any complaints to day. He has brighter affect and doing well.   Principal Problem: Undifferentiated schizophrenia (HCC) Diagnosis:   Patient Active Problem List   Diagnosis Date Noted  . Undifferentiated schizophrenia (HCC) [F20.3] 03/26/2018    Priority: High  . TBI (traumatic brain injury) (HCC) [S06.9X9A] 07/10/2016  . Vitamin B12 deficiency [E53.8] 07/10/2016  . Noncompliance with medication regimen [Z91.14] 06/25/2016  . Cocaine use disorder, mild, abuse (HCC) [F14.10] 06/25/2016  . Cannabis use disorder, mild, abuse [F12.10] 06/25/2016  . Paranoid schizophrenia (HCC) [F20.0] 06/24/2016   Total Time spent with patient: 15 minutes  Past Psychiatric History: See H&p  Past Medical History:  Past Medical History:  Diagnosis Date  . Acute psychosis (HCC)   . Antisocial personality disorder (HCC)   . Cannabis abuse    Past  . Paranoid schizophrenia (HCC)   . Schizoaffective disorder, bipolar type (HCC)    History reviewed. No pertinent surgical history. Family History: History reviewed. No pertinent family history. Family Psychiatric  History: See H&P Social History:  Social History   Substance and Sexual Activity  Alcohol Use No     Social History   Substance and Sexual Activity  Drug Use Not Currently    Social History   Socioeconomic History  . Marital status: Single    Spouse name: Not on file  . Number of children: Not on file  . Years of education: Not on file  . Highest education level: Not on file  Occupational History  . Not on file  Social Needs  . Financial resource  strain: Not on file  . Food insecurity:    Worry: Not on file    Inability: Not on file  . Transportation needs:    Medical: Not on file    Non-medical: Not on file  Tobacco Use  . Smoking status: Former Smoker    Packs/day: 1.00    Types: Cigarettes  . Smokeless tobacco: Never Used  Substance and Sexual Activity  . Alcohol use: No  . Drug use: Not Currently  . Sexual activity: Not Currently  Lifestyle  . Physical activity:    Days per week: Not on file    Minutes per session: Not on file  . Stress: Not on file  Relationships  . Social connections:    Talks on phone: Not on file    Gets together: Not on file    Attends religious service: Not on file    Active member of club or organization: Not on file    Attends meetings of clubs or organizations: Not on file    Relationship status: Not on file  Other Topics Concern  . Not on file  Social History Narrative  . Not on file   Additional Social History:                         Sleep: Good  Appetite:  Good  Current Medications: Current Facility-Administered Medications  Medication Dose Route Frequency Provider Last Rate Last Dose  . acetaminophen (TYLENOL) tablet 650 mg  650  mg Oral Q6H PRN Clapacs, John T, MD      . alum & mag hydroxide-simeth (MAALOX/MYLANTA) 200-200-20 MG/5ML suspension 30 mL  30 mL Oral Q4H PRN Clapacs, John T, MD      . benztropine (COGENTIN) tablet 1 mg  1 mg Oral BID PRN Raheem Kolbe, Ileene Hutchinson, MD   1 mg at 04/08/18 2048   Or  . benztropine mesylate (COGENTIN) injection 1 mg  1 mg Intramuscular BID PRN Stone Spirito, Ileene Hutchinson, MD      . benztropine (COGENTIN) tablet 1 mg  1 mg Oral QHS Ladainian Therien, Ileene Hutchinson, MD   1 mg at 04/28/18 2137  . divalproex (DEPAKOTE) DR tablet 1,000 mg  1,000 mg Oral BH-q7a Lionardo Haze, Ileene Hutchinson, MD   1,000 mg at 04/29/18 1610   And  . divalproex (DEPAKOTE) DR tablet 750 mg  750 mg Oral QHS Valdemar Mcclenahan R, MD   750 mg at 04/28/18 2136  . haloperidol (HALDOL) tablet 5 mg  5 mg Oral Q6H PRN  Kyston Gonce, Ileene Hutchinson, MD       Or  . haloperidol lactate (HALDOL) injection 5 mg  5 mg Intramuscular Q6H PRN Trenden Hazelrigg, Ileene Hutchinson, MD   5 mg at 04/01/18 0819  . haloperidol (HALDOL) tablet 5 mg  5 mg Oral QHS Walta [place], Ileene Hutchinson, MD   5 mg at 04/28/18 2136  . hydrOXYzine (ATARAX/VISTARIL) tablet 50 mg  50 mg Oral TID PRN Audery Amel, MD   50 mg at 04/23/18 2125  . LORazepam (ATIVAN) tablet 1 mg  1 mg Oral Q6H PRN Haskell Riling, MD   1 mg at 04/16/18 2134   Or  . LORazepam (ATIVAN) injection 1 mg  1 mg Intramuscular Q6H PRN Haskell Riling, MD   1 mg at 04/01/18 0820  . magnesium hydroxide (MILK OF MAGNESIA) suspension 30 mL  30 mL Oral Daily PRN Clapacs, John T, MD      . traZODone (DESYREL) tablet 100 mg  100 mg Oral QHS PRN Clapacs, Jackquline Denmark, MD   100 mg at 04/24/18 2128    Lab Results: No results found for this or any previous visit (from the past 48 hour(s)).  Blood Alcohol level:  Lab Results  Component Value Date   ETH <10 03/25/2018   ETH <10 12/06/2017    Metabolic Disorder Labs: Lab Results  Component Value Date   HGBA1C 5.4 03/25/2018   MPG 108.28 03/25/2018   No results found for: PROLACTIN Lab Results  Component Value Date   CHOL 184 03/25/2018   TRIG 43 03/25/2018   HDL 50 03/25/2018   CHOLHDL 3.7 03/25/2018   VLDL 9 03/25/2018   LDLCALC 125 (H) 03/25/2018    Physical Findings: AIMS: Facial and Oral Movements Muscles of Facial Expression: None, normal Lips and Perioral Area: None, normal Jaw: None, normal Tongue: None, normal,Extremity Movements Upper (arms, wrists, hands, fingers): None, normal Lower (legs, knees, ankles, toes): None, normal, Trunk Movements Neck, shoulders, hips: None, normal, Overall Severity Severity of abnormal movements (highest score from questions above): None, normal Incapacitation due to abnormal movements: None, normal Patient's awareness of abnormal movements (rate only patient's report): No Awareness, Dental Status Current problems with  teeth and/or dentures?: No Does patient usually wear dentures?: No  CIWA:  CIWA-Ar Total: 0 COWS:  COWS Total Score: 0  Musculoskeletal: Strength & Muscle Tone: within normal limits Gait & Station: normal Patient leans: N/A  Psychiatric Specialty Exam: Physical Exam  Nursing note and vitals reviewed.  Review of Systems  All other systems reviewed and are negative.   Blood pressure 102/68, pulse 82, temperature 98 F (36.7 C), temperature source Oral, resp. rate 18, height 5\' 8"  (1.727 m), weight 58.1 kg, SpO2 96 %.Body mass index is 19.46 kg/m.  General Appearance: Casual  Eye Contact:  Fair  Speech:  Clear and Coherent  Volume:  Normal  Mood:  Euthymic  Affect:  Appropriate  Thought Process:  Coherent and Goal Directed  Orientation:  Full (Time, Place, and Person)  Thought Content:  Logical  Suicidal Thoughts:  No  Homicidal Thoughts:  No  Memory:  Immediate;   Fair  Judgement:  Impaired  Insight:  Lacking  Psychomotor Activity:  Normal  Concentration:  Concentration: Fair  Recall:  FiservFair  Fund of Knowledge:  Fair  Language:  Fair  Akathisia:  No      Assets:  Resilience  ADL's:  Intact  Cognition:  WNL  Sleep:  Number of Hours: 5.3     Treatment Plan Summary: 37 yo male admitted due to psychosis and aggression. He has been very calm on the unit. He was accepted into a new group home after his interview today. He is out of his room more today and laughing and smiling appropriately. He is organized in thoughts. HE willingly received Invega Injection today.   Schizoaffective disorder -Received Hinda GlatterNvega Sustenna 234 mg today -Continue Depakote 1000 mg qam and 750 mg qhs  Dispo -he was accepted into new group home. They will inform us when he is ready to be discharged there   Haskell RilingHolly R Clent Damore, MD 04/29/2018, 2:21 PM

## 2018-04-29 NOTE — BHH Group Notes (Signed)

## 2018-04-29 NOTE — Progress Notes (Signed)
D- Patient alert and oriented. Patient presents in a pleasant mood on assessment stating that he slept good last night and had no major complaints to voice to this Clinical research associatewriter. Patient denies SI, HI, AVH, and pain at this time. Patient also denies any signs/symptoms of depression/anxiety. Patient's goal for today is to "have a good day".  A- Scheduled medications administered to patient, per MD orders. Support and encouragement provided.  Routine safety checks conducted every 15 minutes.  Patient informed to notify staff with problems or concerns.  R- No adverse drug reactions noted. Patient contracts for safety at this time. Patient compliant with medications and treatment plan. Patient receptive, calm, and cooperative. Patient interacts well with others on the unit.  Patient remains safe at this time.

## 2018-04-29 NOTE — B[facility] Group Notes (Signed)
B[facility] Group Notes:  (Nursing/MHT/Case Management/Adjunct)  Date:  04/29/2018  Time:  9:43 PM  Type of Therapy:  Group Therapy  Participation Level:  Active  Participation Quality:  Appropriate  Affect:  Appropriate  Cognitive:  Alert  Insight:  Good  Engagement in Group:  Engaged  Modes of Intervention:  Support  Summary of Progress/Problems:  David Davila 04/29/2018, 9:43 PM

## 2018-04-30 NOTE — [facility] Group Notes (Signed)
[facility] Group Notes:  (Nursing/MHT/Case Management/Adjunct)  Date:  04/30/2018  Time:  11:05 PM  Type of Therapy:  Group Therapy  Participation Level:  Active  Participation Quality:  Appropriate  Affect:  Appropriate  Cognitive:  Appropriate  Insight:  Appropriate  Engagement in Group:  Engaged  Modes of Intervention:  Discussion  Summary of Progress/Problems: [name] attended group on this day. [name] stated he wanted to be good. [name] did not want to go into detail. MHT reviewed the rules and expectations of the unit. 1. Visitation hours (2 visitors at a time, no one under 12 allowed on unit) 2. Phone hours (one long distance call per day) 3. Routine checks throughout night, encouraged to cover appropriately 4. Clean up after self, no food or drinks allowed in room 5. No touching, hugging, doing hair 6. No walking down other hallways, only room hall and main hall, or going into other's patient rooms 7. Vital times and self-inventory sheets to be completed 8. No use of last names or sharing of personal information MHT processed with patients about attending groups and availability of social workers and doctors. MHT encouraged patients to learn new coping skills while attending groups MHT encouraged communication between prescribing doctors and patients MHT encouraged patients to inform prescribing doctors of the effectiveness of the their medications  Jinger NeighborsKeith D Reon Hunley 04/30/2018, 11:05 PM

## 2018-04-30 NOTE — Progress Notes (Signed)
[facility] MD Progress Note  04/30/2018 2:15 PM [name]  MRN:  [id number] Subjective:  Pt is seen out of his room more and social with staff. He reports feeling good. Denies any complaints. He is excited that he was accepted into a new group home. Denies SI, HI, AH, VH.   Principal Problem: Undifferentiated schizophrenia (HCC) Diagnosis:   Patient Active Problem List   Diagnosis Date Noted  . Undifferentiated schizophrenia (HCC) [F20.3] 03/26/2018    Priority: High  . TBI (traumatic brain injury) (HCC) [S06.9X9A] 07/10/2016  . Vitamin B12 deficiency [E53.8] 07/10/2016  . Noncompliance with medication regimen [Z91.14] 06/25/2016  . Cocaine use disorder, mild, abuse (HCC) [F14.10] 06/25/2016  . Cannabis use disorder, mild, abuse [F12.10] 06/25/2016  . Paranoid schizophrenia (HCC) [F20.0] 06/24/2016   Total Time spent with patient: 15 minutes  Past Psychiatric History: See h&P  Past Medical History:  Past Medical History:  Diagnosis Date  . Acute psychosis (HCC)   . Antisocial personality disorder (HCC)   . Cannabis abuse    Past  . Paranoid schizophrenia (HCC)   . Schizoaffective disorder, bipolar type (HCC)    History reviewed. No pertinent surgical history. Family History: History reviewed. No pertinent family history. Family Psychiatric  History: See h&P Social History:  Social History   Substance and Sexual Activity  Alcohol Use No     Social History   Substance and Sexual Activity  Drug Use Not Currently    Social History   Socioeconomic History  . Marital status: Single    Spouse name: Not on file  . Number of children: Not on file  . Years of education: Not on file  . Highest education level: Not on file  Occupational History  . Not on file  Social Needs  . Financial resource strain: Not on file  . Food insecurity:    Worry: Not on file    Inability: Not on file  . Transportation needs:    Medical: Not on file    Non-medical: Not on file   Tobacco Use  . Smoking status: Former Smoker    Packs/day: 1.00    Types: Cigarettes  . Smokeless tobacco: Never Used  Substance and Sexual Activity  . Alcohol use: No  . Drug use: Not Currently  . Sexual activity: Not Currently  Lifestyle  . Physical activity:    Days per week: Not on file    Minutes per session: Not on file  . Stress: Not on file  Relationships  . Social connections:    Talks on phone: Not on file    Gets together: Not on file    Attends religious service: Not on file    Active member of club or organization: Not on file    Attends meetings of clubs or organizations: Not on file    Relationship status: Not on file  Other Topics Concern  . Not on file  Social History Narrative  . Not on file   Additional Social History:                         Sleep: Good  Appetite:  Good  Current Medications: Current Facility-Administered Medications  Medication Dose Route Frequency Provider Last Rate Last Dose  . acetaminophen (TYLENOL) tablet 650 mg  650 mg Oral Q6H PRN Clapacs, John T, MD      . alum & mag hydroxide-simeth (MAALOX/MYLANTA) 200-200-20 MG/5ML suspension 30 mL  30 mL Oral Q4H  PRN Clapacs, Jackquline Denmark, MD      . benztropine (COGENTIN) tablet 1 mg  1 mg Oral BID PRN Donzel Romack, Ileene Hutchinson, MD   1 mg at 04/08/18 2048   Or  . benztropine mesylate (COGENTIN) injection 1 mg  1 mg Intramuscular BID PRN Makinzi Prieur, Ileene Hutchinson, MD      . benztropine (COGENTIN) tablet 1 mg  1 mg Oral QHS Brizeida Mcmurry, Ileene Hutchinson, MD   1 mg at 04/29/18 2227  . divalproex (DEPAKOTE) DR tablet 1,000 mg  1,000 mg Oral BH-q7a Travis Purk, Ileene Hutchinson, MD   1,000 mg at 04/29/18 1610   And  . divalproex (DEPAKOTE) DR tablet 750 mg  750 mg Oral QHS Jennie Hannay R, MD   750 mg at 04/29/18 2227  . haloperidol (HALDOL) tablet 5 mg  5 mg Oral Q6H PRN Shaunn Tackitt, Ileene Hutchinson, MD       Or  . haloperidol lactate (HALDOL) injection 5 mg  5 mg Intramuscular Q6H PRN Cashus Halterman, Ileene Hutchinson, MD   5 mg at 04/01/18 0819  . haloperidol  (HALDOL) tablet 5 mg  5 mg Oral QHS Nattaly Yebra, Ileene Hutchinson, MD   5 mg at 04/29/18 2227  . hydrOXYzine (ATARAX/VISTARIL) tablet 50 mg  50 mg Oral TID PRN Clapacs, Jackquline Denmark, MD   50 mg at 04/23/18 2125  . LORazepam (ATIVAN) tablet 1 mg  1 mg Oral Q6H PRN Haskell Riling, MD   1 mg at 04/16/18 2134   Or  . LORazepam (ATIVAN) injection 1 mg  1 mg Intramuscular Q6H PRN Haskell Riling, MD   1 mg at 04/01/18 0820  . magnesium hydroxide (MILK OF MAGNESIA) suspension 30 mL  30 mL Oral Daily PRN Clapacs, John T, MD      . traZODone (DESYREL) tablet 100 mg  100 mg Oral QHS PRN Clapacs, Jackquline Denmark, MD   100 mg at 04/29/18 2227    Lab Results: No results found for this or any previous visit (from the past 48 hour(s)).  Blood Alcohol level:  Lab Results  Component Value Date   ETH <10 03/25/2018   ETH <10 12/06/2017    Metabolic Disorder Labs: Lab Results  Component Value Date   HGBA1C 5.4 03/25/2018   MPG 108.28 03/25/2018   No results found for: PROLACTIN Lab Results  Component Value Date   CHOL 184 03/25/2018   TRIG 43 03/25/2018   HDL 50 03/25/2018   CHOLHDL 3.7 03/25/2018   VLDL 9 03/25/2018   LDLCALC 125 (H) 03/25/2018    Physical Findings: AIMS: Facial and Oral Movements Muscles of Facial Expression: None, normal Lips and Perioral Area: None, normal Jaw: None, normal Tongue: None, normal,Extremity Movements Upper (arms, wrists, hands, fingers): None, normal Lower (legs, knees, ankles, toes): None, normal, Trunk Movements Neck, shoulders, hips: None, normal, Overall Severity Severity of abnormal movements (highest score from questions above): None, normal Incapacitation due to abnormal movements: None, normal Patient's awareness of abnormal movements (rate only patient's report): No Awareness, Dental Status Current problems with teeth and/or dentures?: No Does patient usually wear dentures?: No  CIWA:  CIWA-Ar Total: 0 COWS:  COWS Total Score: 0  Musculoskeletal: Strength & Muscle Tone:  within normal limits Gait & Station: normal Patient leans: N/A  Psychiatric Specialty Exam: Physical Exam  Nursing note and vitals reviewed.   Review of Systems  All other systems reviewed and are negative.   Blood pressure 102/68, pulse 82, temperature 98 F (36.7 C), temperature source Oral, resp.  rate 18, height 5\' 8"  (1.727 m), weight 58.1 kg, SpO2 96 %.Body mass index is 19.46 kg/m.  General Appearance: Casual  Eye Contact:  Fair  Speech:  Clear and Coherent  Volume:  Normal  Mood:  Euthymic  Affect:  Appropriate  Thought Process:  Coherent and Goal Directed  Orientation:  Full (Time, Place, and Person)  Thought Content:  Logical  Suicidal Thoughts:  No  Homicidal Thoughts:  No  Memory:  Immediate;   Fair  Judgement:  Impaired  Insight:  Lacking  Psychomotor Activity:  Normal  Concentration:  Concentration: Fair  Recall:  [employer]Fair  Fund of Knowledge:  Fair  Language:  Fair  Akathisia:  No      Assets:  Resilience  ADL's:  Intact  Cognition:  WNL  Sleep:  Number of Hours: 4.15     Treatment Plan Summary: 37 yo male admitted due to decompensation of psychosis. He is doing much better. He received iNvega injection yesterday. He was accepted into new group home. They will let us know when they are ready for him.   Plan:   Schizoaffective disorder -Received Hinda GlatterINvega Sustenna 234 mg yesterday -Continue Depakote at current dose  Dispo -He was accepted into new group home.  Haskell RilingHolly R Zaynah Chawla, MD 04/30/2018, 2:15 PM

## 2018-04-30 NOTE — Progress Notes (Signed)
Recreation Therapy Notes  Date:04/29/2018  Time:9:30 pm  Location:[facility]  Behavioral response:N/A  Intervention Topic: Self-esteem  Discussion/Intervention: Patient did not attend group.  Clinical Observations/Feedback:  Patient did not attend group.  Majd Tissue LRT/CTRS         Amna Welker 04/30/2018 11:02 AM

## 2018-04-30 NOTE — Plan of Care (Signed)
  Problem: Physical Regulation: Goal: Ability to maintain clinical measurements within normal limits will improve Outcome: Progressing  No distress noted, measurement  WNL

## 2018-04-30 NOTE — Progress Notes (Signed)
D:Patient alert and oriented x4,denies SI/HI/AVHbut noted responding to internal stimuli, very pleasant and polite and cooperativewith treatment plans,his thoughts  Are lessdisorganized,speech isnonpressured,he appears less anxious and he isinteracting with peers and staff appropriately.  A:Patientwas offered support and encouragement, and wasgiven scheduled medications.Patientwasalsoencouraged to attend groups, and15 minute checks weremaintainedfor safety.  R:Patient did not attend evening group, he is complaint withmedication, andhas no complaints.Safety maintained on unit, will continue to monitor.

## 2018-04-30 NOTE — [facility] Group Notes (Signed)
LCSW Group Therapy Note  04/30/2018 1:00 pm  Type of Therapy/Topic:  Group Therapy:  Balance in Life  Participation Level:  Did Not Attend  Description of Group:    This group will address the concept of balance and how it feels and looks when one is unbalanced. Patients will be encouraged to process areas in their lives that are out of balance and identify reasons for remaining unbalanced. Facilitators will guide patients in utilizing problem-solving interventions to address and correct the stressor making their life unbalanced. Understanding and applying boundaries will be explored and addressed for obtaining and maintaining a balanced life. Patients will be encouraged to explore ways to assertively make their unbalanced needs known to significant others in their lives, using other group members and facilitator for support and feedback.  Therapeutic Goals: 1. Patient will identify two or more emotions or situations they have that consume much of in their lives. 2. Patient will identify signs/triggers that life has become out of balance:  3. Patient will identify two ways to set boundaries in order to achieve balance in their lives:  4. Patient will demonstrate ability to communicate their needs through discussion and/or role plays  Summary of Patient Progress:      Therapeutic Modalities:   Cognitive Behavioral Therapy Solution-Focused Therapy Assertiveness Training  Alease FrameSonya S Shannin Naab, LCSW 04/30/2018 3:59 PM

## 2018-04-30 NOTE — Plan of Care (Signed)
Patient is alert and oriented to person, place and situation.  Patient in bed majority of this shift resting with his eyes closed. Patient refused medications this morning but gets up for meals. Denies SI/HI/AVH and pain at this time. Milieu remains safe with q 15 minute safety checks.

## 2018-05-01 NOTE — Plan of Care (Signed)
Calm and cooperative. Denying SI. Denying hallucinations

## 2018-05-01 NOTE — Progress Notes (Signed)
Pt refused AM VS. RN will continue to monitor

## 2018-05-01 NOTE — Progress Notes (Signed)
Nursing note 7p-7a  Pt observed interacting with peers on unit this shift. Displayed a flat affect and bright mood upon interaction with this Clinical research associatewriter. Pt denies pain ,denies SI/HI, and also denies any audio or visual hallucinations at this time. Pt complained of insomnia see MAR for prn medication administration. Pt is able to verbally contract for safety with this RN.  Pt is now resting in bed with eyes closed, with no signs or symptoms of pain or distress noted. Pt continues to remain safe on the unit and is observed by rounding every 15 min. RN will continue to monitor.

## 2018-05-01 NOTE — Plan of Care (Signed)
  Problem: Education: Goal: Knowledge of [facility] Education information/materials will improve Outcome: Progressing Goal: Emotional status will improve Outcome: Progressing Goal: Mental status will improve Outcome: Progressing Goal: Verbalization of understanding the information provided will improve Outcome: Progressing   Problem: Activity: Goal: Interest or engagement in activities will improve Outcome: Progressing Goal: Sleeping patterns will improve Outcome: Progressing   Problem: Coping: Goal: Ability to verbalize frustrations and anger appropriately will improve Outcome: Progressing Goal: Ability to demonstrate self-control will improve Outcome: Progressing   Problem: Health Behavior/Discharge Planning: Goal: Identification of resources available to assist in meeting health care needs will improve Outcome: Progressing Goal: Compliance with treatment plan for underlying cause of condition will improve Outcome: Progressing   Problem: Physical Regulation: Goal: Ability to maintain clinical measurements within normal limits will improve Outcome: Progressing   Problem: Safety: Goal: Periods of time without injury will increase Outcome: Progressing   Problem: Activity: Goal: Will verbalize the importance of balancing activity with adequate rest periods Outcome: Progressing   Problem: Education: Goal: Will be free of psychotic symptoms Outcome: Progressing Goal: Knowledge of the prescribed therapeutic regimen will improve Outcome: Progressing   Problem: Coping: Goal: Coping ability will improve Outcome: Progressing Goal: Will verbalize feelings Outcome: Progressing   Problem: Health Behavior/Discharge Planning: Goal: Compliance with prescribed medication regimen will improve Outcome: Progressing   Problem: Nutritional: Goal: Ability to achieve adequate nutritional intake will improve Outcome: Progressing   Problem: Role Relationship: Goal:  Ability to communicate needs accurately will improve Outcome: Progressing Goal: Ability to interact with others will improve Outcome: Progressing   Problem: Safety: Goal: Ability to redirect hostility and anger into socially appropriate behaviors will improve Outcome: Progressing Goal: Ability to remain free from injury will improve Outcome: Progressing   Problem: Self-Care: Goal: Ability to participate in self-care as condition permits will improve Outcome: Progressing   Problem: Self-Concept: Goal: Will verbalize positive feelings about self Outcome: Progressing   Problem: Safety: Goal: Ability to remain free from injury will improve Outcome: Progressing   

## 2018-05-01 NOTE — [facility] Group Notes (Signed)
05/01/2018 1PM  Type of Therapy and Topic:  Group Therapy:  Feelings around Relapse and Recovery  Participation Level:  Did Not Attend   Description of Group:    Patients in this group will discuss emotions they experience before and after a relapse. They will process how experiencing these feelings, or avoidance of experiencing them, relates to having a relapse. Facilitator will guide patients to explore emotions they have related to recovery. Patients will be encouraged to process which emotions are more powerful. They will be guided to discuss the emotional reaction significant others in their lives may have to patients' relapse or recovery. Patients will be assisted in exploring ways to respond to the emotions of others without this contributing to a relapse.  Therapeutic Goals: 1. Patient will identify two or more emotions that lead to a relapse for them 2. Patient will identify two emotions that result when they relapse 3. Patient will identify two emotions related to recovery 4. Patient will demonstrate ability to communicate their needs through discussion and/or role plays   Summary of Patient Progress: Patient was encouraged and invited to attend group. Patient did not attend group. Social worker will continue to encourage group participation in the future.      Therapeutic Modalities:   Cognitive Behavioral Therapy Solution-Focused Therapy Assertiveness Training Relapse Prevention Therapy   Kaianna Dolezal, LCSW 05/01/2018 3:36 PM    

## 2018-05-01 NOTE — Progress Notes (Signed)
Patient ID: David Mulleraul Stephenson Delsignore Jr., male   DOB: 01/22/1981, 37 y.o.   MRN: [id number] PER STATE REGULATIONS 482.30  THIS CHART WAS REVIEWED FOR MEDICAL NECESSITY WITH RESPECT TO THE PATIENT'S ADMISSION/ DURATION OF STAY.  NEXT REVIEW DATE: 05/05/2018  Willa RoughJENNIFER JONES Rana Adorno, RN, BSN CASE MANAGER

## 2018-05-01 NOTE — Plan of Care (Signed)
Patient is appropriate in the unit.Isolated in his room most of the time.No issues verbalized.States "I am good."Denies SI,HI and AVH.Attended sone groups.Appetite and energy level good.Support and encouragement given.

## 2018-05-01 NOTE — [facility] Group Notes (Signed)
[facility] Group Notes:  (Nursing/MHT/Case Management/Adjunct)  Date:  05/01/2018  Time:  9:44 PM  Type of Therapy:  Group Therapy  Participation Level:  Minimal  Participation Quality:  Appropriate  Affect:  Appropriate  Cognitive:  Alert  Insight:  Good  Engagement in Group:  Engaged  Modes of Intervention:  Support  Summary of Progress/Problems:  David Davila 05/01/2018, 9:44 PM

## 2018-05-01 NOTE — Progress Notes (Signed)
[facility] MD Progress Note  05/01/2018 11:09 AM David Davila.  MRN:  [id number] Subjective:  Pt has been calm and appropriate on the unit. He has no complaints today. Denies AH, VH, SI, HI. He plans to try to call his mom again today. HE is sleeping well. Denies any physical complaints.   Principal Problem: Undifferentiated schizophrenia (HCC) Diagnosis:   Patient Active Problem List   Diagnosis Date Noted  . Undifferentiated schizophrenia (HCC) [F20.3] 03/26/2018    Priority: High  . TBI (traumatic brain injury) (HCC) [S06.9X9A] 07/10/2016  . Vitamin B12 deficiency [E53.8] 07/10/2016  . Noncompliance with medication regimen [Z91.14] 06/25/2016  . Cocaine use disorder, mild, abuse (HCC) [F14.10] 06/25/2016  . Cannabis use disorder, mild, abuse [F12.10] 06/25/2016  . Paranoid schizophrenia (HCC) [F20.0] 06/24/2016   Total Time spent with patient: 15 minutes  Past Psychiatric History: See h&P  Past Medical History:  Past Medical History:  Diagnosis Date  . Acute psychosis (HCC)   . Antisocial personality disorder (HCC)   . Cannabis abuse    Past  . Paranoid schizophrenia (HCC)   . Schizoaffective disorder, bipolar type (HCC)    History reviewed. No pertinent surgical history. Family History: History reviewed. No pertinent family history. Family Psychiatric  History: See H&P Social History:  Social History   Substance and Sexual Activity  Alcohol Use No     Social History   Substance and Sexual Activity  Drug Use Not Currently    Social History   Socioeconomic History  . Marital status: Single    Spouse name: Not on file  . Number of children: Not on file  . Years of education: Not on file  . Highest education level: Not on file  Occupational History  . Not on file  Social Needs  . Financial resource strain: Not on file  . Food insecurity:    Worry: Not on file    Inability: Not on file  . Transportation needs:    Medical: Not on file    Non-medical:  Not on file  Tobacco Use  . Smoking status: Former Smoker    Packs/day: 1.00    Types: Cigarettes  . Smokeless tobacco: Never Used  Substance and Sexual Activity  . Alcohol use: No  . Drug use: Not Currently  . Sexual activity: Not Currently  Lifestyle  . Physical activity:    Days per week: Not on file    Minutes per session: Not on file  . Stress: Not on file  Relationships  . Social connections:    Talks on phone: Not on file    Gets together: Not on file    Attends religious service: Not on file    Active member of club or organization: Not on file    Attends meetings of clubs or organizations: Not on file    Relationship status: Not on file  Other Topics Concern  . Not on file  Social History Narrative  . Not on file   Additional Social History:                         Sleep: Good  Appetite:  Good  Current Medications: Current Facility-Administered Medications  Medication Dose Route Frequency Provider Last Rate Last Dose  . acetaminophen (TYLENOL) tablet 650 mg  650 mg Oral Q6H PRN Clapacs, John T, MD      . alum & mag hydroxide-simeth (MAALOX/MYLANTA) 200-200-20 MG/5ML suspension 30 mL  30 mL Oral  Q4H PRN Clapacs, Jackquline Denmark, MD      . benztropine (COGENTIN) tablet 1 mg  1 mg Oral BID PRN Melody Cirrincione, Ileene Hutchinson, MD   1 mg at 04/08/18 2048   Or  . benztropine mesylate (COGENTIN) injection 1 mg  1 mg Intramuscular BID PRN Moselle Rister, Ileene Hutchinson, MD      . benztropine (COGENTIN) tablet 1 mg  1 mg Oral QHS Breelynn Bankert, Ileene Hutchinson, MD   1 mg at 04/30/18 2138  . divalproex (DEPAKOTE) DR tablet 1,000 mg  1,000 mg Oral BH-q7a Varick Keys, Ileene Hutchinson, MD   1,000 mg at 05/01/18 0981   And  . divalproex (DEPAKOTE) DR tablet 750 mg  750 mg Oral QHS Makayleigh Poliquin R, MD   750 mg at 04/30/18 2138  . haloperidol (HALDOL) tablet 5 mg  5 mg Oral Q6H PRN Varun Jourdan, Ileene Hutchinson, MD       Or  . haloperidol lactate (HALDOL) injection 5 mg  5 mg Intramuscular Q6H PRN Finlay Godbee, Ileene Hutchinson, MD   5 mg at 04/01/18 0819  .  haloperidol (HALDOL) tablet 5 mg  5 mg Oral QHS Deborha Moseley, Ileene Hutchinson, MD   5 mg at 04/30/18 2138  . hydrOXYzine (ATARAX/VISTARIL) tablet 50 mg  50 mg Oral TID PRN Clapacs, Jackquline Denmark, MD   50 mg at 04/23/18 2125  . LORazepam (ATIVAN) tablet 1 mg  1 mg Oral Q6H PRN Haskell Riling, MD   1 mg at 04/16/18 2134   Or  . LORazepam (ATIVAN) injection 1 mg  1 mg Intramuscular Q6H PRN Haskell Riling, MD   1 mg at 04/01/18 0820  . magnesium hydroxide (MILK OF MAGNESIA) suspension 30 mL  30 mL Oral Daily PRN Clapacs, John T, MD      . traZODone (DESYREL) tablet 100 mg  100 mg Oral QHS PRN Clapacs, Jackquline Denmark, MD   100 mg at 04/30/18 2138    Lab Results: No results found for this or any previous visit (from the past 48 hour(s)).  Blood Alcohol level:  Lab Results  Component Value Date   ETH <10 03/25/2018   ETH <10 12/06/2017    Metabolic Disorder Labs: Lab Results  Component Value Date   HGBA1C 5.4 03/25/2018   MPG 108.28 03/25/2018   No results found for: PROLACTIN Lab Results  Component Value Date   CHOL 184 03/25/2018   TRIG 43 03/25/2018   HDL 50 03/25/2018   CHOLHDL 3.7 03/25/2018   VLDL 9 03/25/2018   LDLCALC 125 (H) 03/25/2018    Physical Findings: AIMS: Facial and Oral Movements Muscles of Facial Expression: None, normal Lips and Perioral Area: None, normal Jaw: None, normal Tongue: None, normal,Extremity Movements Upper (arms, wrists, hands, fingers): None, normal Lower (legs, knees, ankles, toes): None, normal, Trunk Movements Neck, shoulders, hips: None, normal, Overall Severity Severity of abnormal movements (highest score from questions above): None, normal Incapacitation due to abnormal movements: None, normal Patient's awareness of abnormal movements (rate only patient's report): No Awareness, Dental Status Current problems with teeth and/or dentures?: No Does patient usually wear dentures?: No  CIWA:  CIWA-Ar Total: 0 COWS:  COWS Total Score: 0  Musculoskeletal: Strength &  Muscle Tone: within normal limits Gait & Station: normal Patient leans: N/A  Psychiatric Specialty Exam: Physical Exam  Nursing note and vitals reviewed.   Review of Systems  All other systems reviewed and are negative.   Blood pressure 102/68, pulse 82, temperature 98 F (36.7 C), temperature source Oral,  resp. rate 18, height 5\' 8"  (1.727 m), weight 58.1 kg, SpO2 96 %.Body mass index is 19.46 kg/m.  General Appearance: Casual  Eye Contact:  Fair  Speech:  Clear and Coherent  Volume:  Normal  Mood:  Euthymic  Affect:  Appropriate  Thought Process:  Coherent and Goal Directed  Orientation:  Full (Time, Place, and Person)  Thought Content:  Logical  Suicidal Thoughts:  No  Homicidal Thoughts:  No  Memory:  Immediate;   Fair  Judgement:  Fair  Insight:  Lacking  Psychomotor Activity:  Normal  Concentration:  Concentration: Fair  Recall:  [employer]  Fund of Knowledge:  Fair  Language:  Fair  Akathisia:  No      Assets:  Resilience  ADL's:  Intact  Cognition:  WNL  Sleep:  Number of Hours: 6.75     Treatment Plan Summary: 37 yo male awaiting placement into group home. Psychosis has resolved.   Plan:  Schizoaffective disorder -Received Gean BirchwoodNvega Sustenna injection 234 mg on 9/18 -Continue Depakote  Dispo -Waiting placement into his new group home  Haskell RilingHolly R Tyresha Fede, MD 05/01/2018, 11:09 AM

## 2018-05-01 NOTE — Progress Notes (Signed)
Recreation Therapy Notes  Date: 05/01/2018  Time: 9:30 pm   Location: Craft Room   Behavioral response: N/A   Intervention Topic: Coping skills  Discussion/Intervention: Patient did not attend group.   Clinical Observations/Feedback:  Patient did not attend group.   Donabelle Molden LRT/CTRS        Lesleigh Hughson 05/01/2018 11:25 AM 

## 2018-05-02 NOTE — Progress Notes (Signed)
Patient has been in bed sleeping. No sign of distress. Stayed in the milieu until bedtime. Attended evening group with minimal  Participation but maintained appropriate behavior. Presented to the medication room and reported that he  Is feeling improved. Denied SI and hallucinations. Received medications and had no concerns. Support and encouragements provided. Staff continue to monitor for safety.

## 2018-05-02 NOTE — [facility] Group Notes (Signed)
LCSW Group Therapy Note  05/02/2018 1:15pm  Type of Therapy and Topic: Group Therapy: Holding on to Grudges   Participation Level: Active   Description of Group:  In this group patients will be asked to explore and define a grudge. Patients will be guided to discuss their thoughts, feelings, and reasons as to why people have grudges. Patients will process the impact grudges have on daily life and identify thoughts and feelings related to holding grudges. Facilitator will challenge patients to identify ways to let go of grudges and the benefits this provides. Patients will be confronted to address why one struggles letting go of grudges. Lastly, patients will identify feelings and thoughts related to what life would look like without grudges. This group will be process-oriented, with patients participating in exploration of their own experiences, giving and receiving support, and processing challenge from other group members.  Therapeutic Goals:  1. Patient will identify specific grudges related to their personal life.  2. Patient will identify feelings, thoughts, and beliefs around grudges.  3. Patient will identify how one releases grudges appropriately.  4. Patient will identify situations where they could have let go of the grudge, but instead chose to hold on.   Summary of Patient Progress: The patient reported he feels "great." Patients were guided to discuss their thoughts, feelings, and reasons as to why people have grudges. The patient was able to process the impact grudges have on daily life and identified thoughts and feelings related to holding grudges. The patient was challenged to identify ways to let go of grudges and the benefits this provides. Pt. actively and appropriately engaged in the group. Patient was able to provide support and validation to other group members.   Therapeutic Modalities:  Cognitive Behavioral Therapy  Solution Focused Therapy  Motivational Interviewing   Brief Therapy   Kyrese Gartman  CUEBAS-COLON, LCSW 05/02/2018 12:51 PM

## 2018-05-02 NOTE — Progress Notes (Signed)
D: Patient denies SI/HI/AVH. Patient verbally contracts for safety. Patient is calm, cooperative and pleasant. Patient is seen in milieu, but isolates to self. Patient has no complaints at this time.  A: Patient was assessed by this nurse. Patient was oriented to unit. Patient's safety was maintained on unit. Q x 15 minute observation checks were completed for safety. Patient care plan was reviewed. Patient was offered support and encouragement. Patient was encourage to attend groups, participate in unit activities and continue with plan of care.   R: Patient has no complaints of pain at this time. Patient is receptive to treatment and safety maintained on unit.

## 2018-05-02 NOTE — Progress Notes (Signed)
[facility]  05/02/2018 1:48 PM David MullerPaul Stephenson Henrie Jr.  MRN:  [id number] Subjective:  Pt seen and chart reviewed. David Davila is seeing walking quietly on the unit, not participating groups. David Davila reports feeling well, and hope to get to a group home next week. David Davila eats and sleeps well. Denies AH, VH, SI, HI. Tolerates meds without complaints.   Principal Problem: Undifferentiated schizophrenia (HCC) Diagnosis:   Patient Active Problem List   Diagnosis Date Noted  . Undifferentiated schizophrenia (HCC) [F20.3] 03/26/2018  . TBI (traumatic brain injury) (HCC) [S06.9X9A] 07/10/2016  . Vitamin B12 deficiency [E53.8] 07/10/2016  . Noncompliance with medication regimen [Z91.14] 06/25/2016  . Cocaine use disorder, mild, abuse (HCC) [F14.10] 06/25/2016  . Cannabis use disorder, mild, abuse [F12.10] 06/25/2016  . Paranoid schizophrenia (HCC) [F20.0] 06/24/2016   Total Time spent with patient: 15 minutes  Past Psychiatric History: See h&P  Past Medical History:  Past Medical History:  Diagnosis Date  . Acute psychosis (HCC)   . Antisocial personality disorder (HCC)   . Cannabis abuse    Past  . Paranoid schizophrenia (HCC)   . Schizoaffective disorder, bipolar type (HCC)    History reviewed. No pertinent surgical history. Family History: History reviewed. No pertinent family history. Family Psychiatric  History: See H&P Social History:  Social History   Substance and Sexual Activity  Alcohol Use No     Social History   Substance and Sexual Activity  Drug Use Not Currently    Social History   Socioeconomic History  . Marital status: Single    Spouse name: Not on file  . Number of children: Not on file  . Years of education: Not on file  . Highest education level: Not on file  Occupational History  . Not on file  Social Needs  . Financial resource strain: Not on file  . Food insecurity:    Worry: Not on file    Inability: Not on file  . Transportation needs:   Medical: Not on file    Non-medical: Not on file  Tobacco Use  . Smoking status: Former Smoker    Packs/day: 1.00    Types: Cigarettes  . Smokeless tobacco: Never Used  Substance and Sexual Activity  . Alcohol use: No  . Drug use: Not Currently  . Sexual activity: Not Currently  Lifestyle  . Physical activity:    Days per week: Not on file    Minutes per session: Not on file  . Stress: Not on file  Relationships  . Social connections:    Talks on phone: Not on file    Gets together: Not on file    Attends religious service: Not on file    Active member of club or organization: Not on file    Attends meetings of clubs or organizations: Not on file    Relationship status: Not on file  Other Topics Concern  . Not on file  Social History Narrative  . Not on file    Sleep: Good  Appetite:  Good  Current Medications: Current Facility-Administered Medications  Medication Dose Route Frequency Provider Last Rate Last Dose  . acetaminophen (TYLENOL) tablet 650 mg  650 mg Oral Q6H PRN David Davila, David T, MD      . alum & mag hydroxide-simeth (MAALOX/MYLANTA) 200-200-20 MG/5ML suspension 30 mL  30 mL Oral Q4H PRN David Davila, David T, MD      . benztropine (COGENTIN) tablet 1 mg  1 mg Oral BID PRN David Davila, David HutchinsonHolly R,  MD   1 mg at 04/08/18 2048   Or  . benztropine mesylate (COGENTIN) injection 1 mg  1 mg Intramuscular BID PRN David Davila, David Hutchinson, MD      . benztropine (COGENTIN) tablet 1 mg  1 mg Oral QHS David Davila, David Hutchinson, MD   1 mg at 05/01/18 2128  . divalproex (DEPAKOTE) DR tablet 1,000 mg  1,000 mg Oral BH-q7a David Davila, Holly R, MD   1,000 mg at 05/01/18 4540   And  . divalproex (DEPAKOTE) DR tablet 750 mg  750 mg Oral QHS David Davila, Holly R, MD   750 mg at 05/01/18 2128  . haloperidol (HALDOL) tablet 5 mg  5 mg Oral Q6H PRN David Davila, David Hutchinson, MD       Or  . haloperidol lactate (HALDOL) injection 5 mg  5 mg Intramuscular Q6H PRN David Davila, David Hutchinson, MD   5 mg at 04/01/18 0819  . haloperidol (HALDOL) tablet 5  mg  5 mg Oral QHS David Davila, Holly R, MD   5 mg at 05/01/18 2128  . hydrOXYzine (ATARAX/VISTARIL) tablet 50 mg  50 mg Oral TID PRN David Davila, David Denmark, MD   50 mg at 04/23/18 2125  . LORazepam (ATIVAN) tablet 1 mg  1 mg Oral Q6H PRN David Riling, MD   1 mg at 04/16/18 2134   Or  . LORazepam (ATIVAN) injection 1 mg  1 mg Intramuscular Q6H PRN David Riling, MD   1 mg at 04/01/18 0820  . magnesium hydroxide (MILK OF MAGNESIA) suspension 30 mL  30 mL Oral Daily PRN David Davila, David T, MD      . traZODone (DESYREL) tablet 100 mg  100 mg Oral QHS PRN David Davila, David Denmark, MD   100 mg at 04/30/18 2138    Lab Results: No results found for this or any previous visit (from the past 48 hour(s)).  Blood Alcohol level:  Lab Results  Component Value Date   ETH <10 03/25/2018   ETH <10 12/06/2017    Metabolic Disorder Labs: Lab Results  Component Value Date   HGBA1C 5.4 03/25/2018   MPG 108.28 03/25/2018   No results found for: PROLACTIN Lab Results  Component Value Date   CHOL 184 03/25/2018   TRIG 43 03/25/2018   HDL 50 03/25/2018   CHOLHDL 3.7 03/25/2018   VLDL 9 03/25/2018   LDLCALC 125 (H) 03/25/2018    Physical Findings: AIMS: Facial and Oral Movements Muscles of Facial Expression: None, normal Lips and Perioral Area: None, normal Jaw: None, normal Tongue: None, normal,Extremity Movements Upper (arms, wrists, hands, fingers): None, normal Lower (legs, knees, ankles, toes): None, normal, Trunk Movements Neck, shoulders, hips: None, normal, Overall Severity Severity of abnormal movements (highest score from questions above): None, normal Incapacitation due to abnormal movements: None, normal Patient's awareness of abnormal movements (rate only patient's report): No Awareness, Dental Status Current problems with teeth and/or dentures?: No Does patient usually wear dentures?: No  CIWA:  CIWA-Ar Total: 0 COWS:  COWS Total Score: 0  Musculoskeletal: Strength & Muscle Tone: within normal  limits Gait & Station: normal Patient leans: N/A  Psychiatric Specialty Exam: Physical Exam  Nursing note and vitals reviewed.   Review of Systems  All other systems reviewed and are negative.   Blood pressure 102/68, pulse 82, temperature 98 F (36.7 C), temperature source Oral, resp. rate 18, height 5\' 8"  (1.727 m), weight 58.1 kg, SpO2 96 %.Body mass index is 19.46 kg/m.  General Appearance: Casual  Eye Contact:  Fair  Speech:  Clear and Coherent  Volume:  Normal  Mood:  Euthymic  Affect:  Appropriate  Thought Process:  Coherent and Goal Directed  Orientation:  Full (Time, Place, and Person)  Thought Content:  Logical  Suicidal Thoughts:  No  Homicidal Thoughts:  No  Memory:  Immediate;   Fair  Judgement:  Fair  Insight:  Lacking  Psychomotor Activity:  Normal  Concentration:  Concentration: Fair  Recall:  [employer] of Knowledge:  Fair  Language:  Fair  Akathisia:  No      Assets:  Resilience  ADL's:  Intact  Cognition:  WNL  Sleep:  Number of Hours: 6.5     Treatment Plan Summary: 37 yo male awaiting placement into group home. Psychosis has resolved.   Plan:  Schizoaffective disorder -Received Gean Birchwood injection 234 mg on 9/18 -Continue Depakote 1000mg  qam and 750mg  qhs.  - continue to provide haldol and ativan prn for severe agitation, but pt has not needed any.   Dispo -Waiting placement into his new group home  Jalei Shibley, MD 05/02/2018, 1:48 PM

## 2018-05-02 NOTE — Progress Notes (Signed)
D: Patient remains isolative.  He did come up for snack time.  He was present for his nighttime medications.  He is soft spoken; quiet.  He denies any thoughts of self harm or auditory hallucinations.  Patient has flat; blunted and depressed mood.  A: Continue to monitor medication management and MD orders.  Safety checks completed every 15 minutes per protocol.  Offer support and encouragement as needed.  R: Patient is receptive to staff; his behavior is appropriate.

## 2018-05-03 NOTE — Progress Notes (Signed)
[facility]  05/03/2018 5:51 PM David MullerPaul Stephenson Shewell Jr.  MRN:  [id number] Subjective:  Pt seen and chart reviewed. David Davila continues to do well on the unit, calm and cooperative, no behavioral concerns. David Davila tolerates his meds generally, but stated that they make him tired in the morning.  David Davila hopes to get to a group home next week. David Davila eats and sleeps well. Denies AH, VH, SI, HI.    Principal Problem: Undifferentiated schizophrenia (HCC) Diagnosis:   Patient Active Problem List   Diagnosis Date Noted  . Undifferentiated schizophrenia (HCC) [F20.3] 03/26/2018  . TBI (traumatic brain injury) (HCC) [S06.9X9A] 07/10/2016  . Vitamin B12 deficiency [E53.8] 07/10/2016  . Noncompliance with medication regimen [Z91.14] 06/25/2016  . Cocaine use disorder, mild, abuse (HCC) [F14.10] 06/25/2016  . Cannabis use disorder, mild, abuse [F12.10] 06/25/2016  . Paranoid schizophrenia (HCC) [F20.0] 06/24/2016   Total Time spent with patient: 15 minutes  Past Psychiatric History: See h&P  Past Medical History:  Past Medical History:  Diagnosis Date  . Acute psychosis (HCC)   . Antisocial personality disorder (HCC)   . Cannabis abuse    Past  . Paranoid schizophrenia (HCC)   . Schizoaffective disorder, bipolar type (HCC)    History reviewed. No pertinent surgical history. Family History: History reviewed. No pertinent family history. Family Psychiatric  History: See H&P Social History:  Social History   Substance and Sexual Activity  Alcohol Use No     Social History   Substance and Sexual Activity  Drug Use Not Currently    Social History   Socioeconomic History  . Marital status: Single    Spouse name: Not on file  . Number of children: Not on file  . Years of education: Not on file  . Highest education level: Not on file  Occupational History  . Not on file  Social Needs  . Financial resource strain: Not on file  . Food insecurity:    Worry: Not on file    Inability: Not on  file  . Transportation needs:    Medical: Not on file    Non-medical: Not on file  Tobacco Use  . Smoking status: Former Smoker    Packs/day: 1.00    Types: Cigarettes  . Smokeless tobacco: Never Used  Substance and Sexual Activity  . Alcohol use: No  . Drug use: Not Currently  . Sexual activity: Not Currently  Lifestyle  . Physical activity:    Days per week: Not on file    Minutes per session: Not on file  . Stress: Not on file  Relationships  . Social connections:    Talks on phone: Not on file    Gets together: Not on file    Attends religious service: Not on file    Active member of club or organization: Not on file    Attends meetings of clubs or organizations: Not on file    Relationship status: Not on file  Other Topics Concern  . Not on file  Social History Narrative  . Not on file    Sleep: Good  Appetite:  Good  Current Medications: Current Facility-Administered Medications  Medication Dose Route Frequency Provider Last Rate Last Dose  . acetaminophen (TYLENOL) tablet 650 mg  650 mg Oral Q6H PRN Clapacs, John T, MD      . alum & mag hydroxide-simeth (MAALOX/MYLANTA) 200-200-20 MG/5ML suspension 30 mL  30 mL Oral Q4H PRN Clapacs, Jackquline DenmarkJohn T, MD      .  benztropine (COGENTIN) tablet 1 mg  1 mg Oral BID PRN McNew, Ileene Hutchinson, MD   1 mg at 04/08/18 2048   Or  . benztropine mesylate (COGENTIN) injection 1 mg  1 mg Intramuscular BID PRN McNew, Ileene Hutchinson, MD      . benztropine (COGENTIN) tablet 1 mg  1 mg Oral QHS McNew, Ileene Hutchinson, MD   1 mg at 05/02/18 2126  . divalproex (DEPAKOTE) DR tablet 1,000 mg  1,000 mg Oral BH-q7a McNew, Ileene Hutchinson, MD   1,000 mg at 05/03/18 0631   And  . divalproex (DEPAKOTE) DR tablet 750 mg  750 mg Oral QHS McNew, Holly R, MD   750 mg at 05/02/18 2126  . haloperidol (HALDOL) tablet 5 mg  5 mg Oral Q6H PRN McNew, Ileene Hutchinson, MD       Or  . haloperidol lactate (HALDOL) injection 5 mg  5 mg Intramuscular Q6H PRN McNew, Ileene Hutchinson, MD   5 mg at 04/01/18 0819   . haloperidol (HALDOL) tablet 5 mg  5 mg Oral QHS McNew, Ileene Hutchinson, MD   5 mg at 05/02/18 2126  . hydrOXYzine (ATARAX/VISTARIL) tablet 50 mg  50 mg Oral TID PRN Clapacs, Jackquline Denmark, MD   50 mg at 04/23/18 2125  . LORazepam (ATIVAN) tablet 1 mg  1 mg Oral Q6H PRN Haskell Riling, MD   1 mg at 04/16/18 2134   Or  . LORazepam (ATIVAN) injection 1 mg  1 mg Intramuscular Q6H PRN Haskell Riling, MD   1 mg at 04/01/18 0820  . magnesium hydroxide (MILK OF MAGNESIA) suspension 30 mL  30 mL Oral Daily PRN Clapacs, John T, MD      . traZODone (DESYREL) tablet 100 mg  100 mg Oral QHS PRN Clapacs, Jackquline Denmark, MD   100 mg at 04/30/18 2138    Lab Results: No results found for this or any previous visit (from the past 48 hour(s)).  Blood Alcohol level:  Lab Results  Component Value Date   ETH <10 03/25/2018   ETH <10 12/06/2017    Metabolic Disorder Labs: Lab Results  Component Value Date   HGBA1C 5.4 03/25/2018   MPG 108.28 03/25/2018   No results found for: PROLACTIN Lab Results  Component Value Date   CHOL 184 03/25/2018   TRIG 43 03/25/2018   HDL 50 03/25/2018   CHOLHDL 3.7 03/25/2018   VLDL 9 03/25/2018   LDLCALC 125 (H) 03/25/2018    Physical Findings: AIMS: Facial and Oral Movements Muscles of Facial Expression: None, normal Lips and Perioral Area: None, normal Jaw: None, normal Tongue: None, normal,Extremity Movements Upper (arms, wrists, hands, fingers): None, normal Lower (legs, knees, ankles, toes): None, normal, Trunk Movements Neck, shoulders, hips: None, normal, Overall Severity Severity of abnormal movements (highest score from questions above): None, normal Incapacitation due to abnormal movements: None, normal Patient's awareness of abnormal movements (rate only patient's report): No Awareness, Dental Status Current problems with teeth and/or dentures?: No Does patient usually wear dentures?: No  CIWA:  CIWA-Ar Total: 0 COWS:  COWS Total Score:  0  Musculoskeletal: Strength & Muscle Tone: within normal limits Gait & Station: normal Patient leans: N/A  Psychiatric Specialty Exam: Physical Exam  Nursing note and vitals reviewed.   Review of Systems  All other systems reviewed and are negative.   Blood pressure 124/71, pulse 76, temperature 98.1 F (36.7 C), temperature source Oral, resp. rate 17, height 5\' 8"  (1.727 m), weight 58.1 kg, SpO2  100 %.Body mass index is 19.46 kg/m.  General Appearance: Casual  Eye Contact:  Fair  Speech:  Clear and Coherent  Volume:  Normal  Mood:  Euthymic  Affect:  Appropriate  Thought Process:  Coherent and Goal Directed  Orientation:  Full (Time, Place, and Person)  Thought Content:  Logical  Suicidal Thoughts:  No  Homicidal Thoughts:  No  Memory:  Immediate;   Fair  Judgement:  Fair  Insight:  Lacking  Psychomotor Activity:  Normal  Concentration:  Concentration: Fair  Recall:  [employer] of Knowledge:  Fair  Language:  Fair  Akathisia:  No      Assets:  Resilience  ADL's:  Intact  Cognition:  WNL  Sleep:  Number of Hours: 6.5     Treatment Plan Summary: 37 yo male awaiting placement into group home. Psychosis has resolved.   Plan:  Schizoaffective disorder -Received Gean Birchwood injection 234 mg on 9/18 -Continue Depakote 1000mg  qam and 750mg  qhs. Please consider to change it to perhaps 500mg  qam and 1250mg  qhs to reduce day time sedation.  Can also consider Depakote ER for better tolerability.   -continue to provide haldol and ativan prn for severe agitation, but pt has not needed any.   Dispo -Waiting placement into his new group home  Rosa Gambale, MD 05/03/2018, 5:51 PM

## 2018-05-03 NOTE — Progress Notes (Signed)
D- Patient alert and oriented. Patient presents in a pleasant mood on assessment, however, his interaction is very minimal and he has been isolative to his room majority of the morning. Patient denies SI, HI, AVH, and pain at this time. Patient also denies any signs/symptoms of depression and anxiety. Patient has not stated goals for today.  A- Support and encouragement provided.  Routine safety checks conducted every 15 minutes. Patient informed to notify staff with problems or concerns.  R- No adverse drug reactions noted. Patient contracts for safety at this time. Patient compliant with medications and treatment plan. Patient receptive, calm, and cooperative. Patient interacts well with others on the unit.  Patient remains safe at this time.

## 2018-05-03 NOTE — [facility] Group Notes (Signed)
LCSW Group Therapy Note 05/03/2018 1:15pm  Type of Therapy and Topic: Group Therapy: Feelings Around Returning Home & Establishing a Supportive Framework and Supporting Oneself When Supports Not Available  Participation Level: None  Description of Group:  Patients first processed thoughts and feelings about upcoming discharge. These included fears of upcoming changes, lack of change, new living environments, judgements and expectations from others and overall stigma of mental health issues. The group then discussed the definition of a supportive framework, what that looks and feels like, and how do to discern it from an unhealthy non-supportive network. The group identified different types of supports as well as what to do when your family/friends are less than helpful or unavailable  Therapeutic Goals  1. Patient will identify one healthy supportive network that they can use at discharge. 2. Patient will identify one factor of a supportive framework and how to tell it from an unhealthy network. 3. Patient able to identify one coping skill to use when they do not have positive supports from others. 4. Patient will demonstrate ability to communicate their needs through discussion and/or role plays.  Summary of Patient Progress:  Pt scored his mood at an 8 (10 best) and left the group after introduction.    Therapeutic Modalities Cognitive Behavioral Therapy Motivational Interviewing   Agata Lucente  CUEBAS-COLON, LCSW 05/03/2018 10:55 AM

## 2018-05-03 NOTE — Progress Notes (Signed)
[facility]  05/04/2018 5:31 PM David MullerPaul Stephenson Brester Jr.  MRN:  [id number]  Subjective:    [name] awaits placement is a group home. No complaints, no events. Tolerates medications well.   Principal Problem: Undifferentiated schizophrenia (HCC) Diagnosis:   Patient Active Problem List   Diagnosis Date Noted  . Undifferentiated schizophrenia (HCC) [F20.3] 03/26/2018    Priority: High  . TBI (traumatic brain injury) (HCC) [S06.9X9A] 07/10/2016  . Vitamin B12 deficiency [E53.8] 07/10/2016  . Noncompliance with medication regimen [Z91.14] 06/25/2016  . Cocaine use disorder, mild, abuse (HCC) [F14.10] 06/25/2016  . Cannabis use disorder, mild, abuse [F12.10] 06/25/2016  . Paranoid schizophrenia (HCC) [F20.0] 06/24/2016   Total Time spent with patient: 15 minutes  Past Psychiatric History: schizophrenia  Past Medical History:  Past Medical History:  Diagnosis Date  . Acute psychosis (HCC)   . Antisocial personality disorder (HCC)   . Cannabis abuse    Past  . Paranoid schizophrenia (HCC)   . Schizoaffective disorder, bipolar type (HCC)    History reviewed. No pertinent surgical history. Family History: History reviewed. No pertinent family history. Family Psychiatric  History: See H&P Social History:  Social History   Substance and Sexual Activity  Alcohol Use No     Social History   Substance and Sexual Activity  Drug Use Not Currently    Social History   Socioeconomic History  . Marital status: Single    Spouse name: Not on file  . Number of children: Not on file  . Years of education: Not on file  . Highest education level: Not on file  Occupational History  . Not on file  Social Needs  . Financial resource strain: Not on file  . Food insecurity:    Worry: Not on file    Inability: Not on file  . Transportation needs:    Medical: Not on file    Non-medical: Not on file  Tobacco Use  . Smoking status: Former Smoker    Packs/day: 1.00     Types: Cigarettes  . Smokeless tobacco: Never Used  Substance and Sexual Activity  . Alcohol use: No  . Drug use: Not Currently  . Sexual activity: Not Currently  Lifestyle  . Physical activity:    Days per week: Not on file    Minutes per session: Not on file  . Stress: Not on file  Relationships  . Social connections:    Talks on phone: Not on file    Gets together: Not on file    Attends religious service: Not on file    Active member of club or organization: Not on file    Attends meetings of clubs or organizations: Not on file    Relationship status: Not on file  Other Topics Concern  . Not on file  Social History Narrative  . Not on file   Additional Social History:                         Sleep: Fair  Appetite:  Fair  Current Medications: Current Facility-Administered Medications  Medication Dose Route Frequency Provider Last Rate Last Dose  . acetaminophen (TYLENOL) tablet 650 mg  650 mg Oral Q6H PRN Clapacs, John T, MD      . alum & mag hydroxide-simeth (MAALOX/MYLANTA) 200-200-20 MG/5ML suspension 30 mL  30 mL Oral Q4H PRN Clapacs, John T, MD      . benztropine (COGENTIN) tablet 1 mg  1 mg  Oral BID PRN Haskell Riling, MD   1 mg at 04/08/18 2048   Or  . benztropine mesylate (COGENTIN) injection 1 mg  1 mg Intramuscular BID PRN McNew, Ileene Hutchinson, MD      . benztropine (COGENTIN) tablet 1 mg  1 mg Oral QHS McNew, Ileene Hutchinson, MD   1 mg at 05/03/18 2226  . divalproex (DEPAKOTE) DR tablet 1,000 mg  1,000 mg Oral BH-q7a McNew, Ileene Hutchinson, MD   1,000 mg at 05/04/18 1191   And  . divalproex (DEPAKOTE) DR tablet 750 mg  750 mg Oral QHS McNew, Holly R, MD   750 mg at 05/03/18 2226  . haloperidol (HALDOL) tablet 5 mg  5 mg Oral Q6H PRN McNew, Ileene Hutchinson, MD       Or  . haloperidol lactate (HALDOL) injection 5 mg  5 mg Intramuscular Q6H PRN McNew, Ileene Hutchinson, MD   5 mg at 04/01/18 0819  . haloperidol (HALDOL) tablet 5 mg  5 mg Oral QHS McNew, Holly R, MD   5 mg at 05/03/18 2226   . hydrOXYzine (ATARAX/VISTARIL) tablet 50 mg  50 mg Oral TID PRN Clapacs, Jackquline Denmark, MD   50 mg at 04/23/18 2125  . LORazepam (ATIVAN) tablet 1 mg  1 mg Oral Q6H PRN Haskell Riling, MD   1 mg at 04/16/18 2134   Or  . LORazepam (ATIVAN) injection 1 mg  1 mg Intramuscular Q6H PRN Haskell Riling, MD   1 mg at 04/01/18 0820  . magnesium hydroxide (MILK OF MAGNESIA) suspension 30 mL  30 mL Oral Daily PRN Clapacs, John T, MD      . traZODone (DESYREL) tablet 100 mg  100 mg Oral QHS PRN Clapacs, Jackquline Denmark, MD   100 mg at 05/03/18 2226    Lab Results: No results found for this or any previous visit (from the past 48 hour(s)).  Blood Alcohol level:  Lab Results  Component Value Date   ETH <10 03/25/2018   ETH <10 12/06/2017    Metabolic Disorder Labs: Lab Results  Component Value Date   HGBA1C 5.4 03/25/2018   MPG 108.28 03/25/2018   No results found for: PROLACTIN Lab Results  Component Value Date   CHOL 184 03/25/2018   TRIG 43 03/25/2018   HDL 50 03/25/2018   CHOLHDL 3.7 03/25/2018   VLDL 9 03/25/2018   LDLCALC 125 (H) 03/25/2018    Physical Findings: AIMS: Facial and Oral Movements Muscles of Facial Expression: None, normal Lips and Perioral Area: None, normal Jaw: None, normal Tongue: None, normal,Extremity Movements Upper (arms, wrists, hands, fingers): None, normal Lower (legs, knees, ankles, toes): None, normal, Trunk Movements Neck, shoulders, hips: None, normal, Overall Severity Severity of abnormal movements (highest score from questions above): None, normal Incapacitation due to abnormal movements: None, normal Patient's awareness of abnormal movements (rate only patient's report): No Awareness, Dental Status Current problems with teeth and/or dentures?: No Does patient usually wear dentures?: No  CIWA:  CIWA-Ar Total: 0 COWS:  COWS Total Score: 0  Musculoskeletal: Strength & Muscle Tone: within normal limits Gait & Station: normal Patient leans:  N/A  Psychiatric Specialty Exam: Physical Exam  Nursing note and vitals reviewed. Psychiatric: His speech is normal and behavior is normal. Thought content normal. His affect is blunt. Cognition and memory are normal. He expresses impulsivity.    Review of Systems  Neurological: Negative.   Psychiatric/Behavioral: Negative.   All other systems reviewed and are negative.  Blood pressure 111/67, pulse 96, temperature 98.4 F (36.9 C), temperature source Oral, resp. rate 18, height 5\' 8"  (1.727 m), weight 58.1 kg, SpO2 97 %.Body mass index is 19.46 kg/m.  General Appearance: Casual  Eye Contact:  Fair  Speech:  Clear and Coherent  Volume:  Decreased  Mood:  Euthymic  Affect:  Blunt  Thought Process:  Goal Directed and Descriptions of Associations: Intact  Orientation:  Full (Time, Place, and Person)  Thought Content:  WDL  Suicidal Thoughts:  No  Homicidal Thoughts:  No  Memory:  Immediate;   Fair Recent;   Fair Remote;   Fair  Judgement:  Poor  Insight:  Lacking  Psychomotor Activity:  Decreased  Concentration:  Concentration: Fair and Attention Span: Fair  Recall:  [employer] of Knowledge:  Fair  Language:  Fair  Akathisia:  No  Handed:  Right  AIMS (if indicated):     Assets:  Communication Skills Desire for Improvement Financial Resources/Insurance Physical Health Resilience Social Support  ADL's:  Intact  Cognition:  WNL  Sleep:  Number of Hours: 6.5     Treatment Plan Summary: Daily contact with patient to assess and evaluate symptoms and progress in treatment and Medication management   37 yo male awaiting placement into group home. Psychosis has resolved.   Plan:  Schizoaffective disorder -Received Gean Birchwood injection 234 mg on 9/18 -Continue Depakote 1000mg  qam and 750mg  qhs. Please consider to change it to perhaps 500mg  qam and 1250mg  qhs to reduce day time sedation.  Can also consider Depakote ER for better tolerability.   -continue to  provide haldol and ativan prn for severe agitation, but pt has not needed any.   Dispo -Waiting placement into his new group      Kristine Linea, MD 05/04/2018, 5:31 PM

## 2018-05-03 NOTE — Plan of Care (Signed)
Patient has verbalized understanding of his prescribed therapeutic regimen as well as the general information that's been provided to him and all questions and concerns have been addressed and answered at this time. Patient denies SI/HI/AVH as well as any signs/symptoms of depression and anxiety. Patient has been isolative to his room and sleeping majority of the morning, however, he attended the afternoon social work group for a few minutes before returning to his room. Patient has the ability to verbalize his anger and frustrations into appropriate behaviors and has been demonstrating self-control on the unit. Patient has the ability to identify the available resources that can assist him in meeting his health-care needs. Patient has been working towards maintaining his clinical measurements within normal limits and has achieved adequate nutrition. Patient participates in self-care when appropriate for him. Patient has been free from injury thus far and remains safe on this unit at this time.  Problem: Education: Goal: Knowledge of [facility] Education information/materials will improve Outcome: Progressing Goal: Emotional status will improve Outcome: Progressing Goal: Mental status will improve Outcome: Progressing Goal: Verbalization of understanding the information provided will improve Outcome: Progressing   Problem: Activity: Goal: Interest or engagement in activities will improve Outcome: Progressing Goal: Sleeping patterns will improve Outcome: Progressing   Problem: Coping: Goal: Ability to verbalize frustrations and anger appropriately will improve Outcome: Progressing Goal: Ability to demonstrate self-control will improve Outcome: Progressing   Problem: Health Behavior/Discharge Planning: Goal: Identification of resources available to assist in meeting health care needs will improve Outcome: Progressing Goal: Compliance with treatment plan for underlying cause of  condition will improve Outcome: Progressing   Problem: Physical Regulation: Goal: Ability to maintain clinical measurements within normal limits will improve Outcome: Progressing   Problem: Safety: Goal: Periods of time without injury will increase Outcome: Progressing   Problem: Activity: Goal: Will verbalize the importance of balancing activity with adequate rest periods Outcome: Progressing   Problem: Education: Goal: Will be free of psychotic symptoms Outcome: Progressing Goal: Knowledge of the prescribed therapeutic regimen will improve Outcome: Progressing   Problem: Coping: Goal: Coping ability will improve Outcome: Progressing Goal: Will verbalize feelings Outcome: Progressing   Problem: Health Behavior/Discharge Planning: Goal: Compliance with prescribed medication regimen will improve Outcome: Progressing   Problem: Nutritional: Goal: Ability to achieve adequate nutritional intake will improve Outcome: Progressing   Problem: Role Relationship: Goal: Ability to communicate needs accurately will improve Outcome: Progressing Goal: Ability to interact with others will improve Outcome: Progressing   Problem: Safety: Goal: Ability to redirect hostility and anger into socially appropriate behaviors will improve Outcome: Progressing Goal: Ability to remain free from injury will improve Outcome: Progressing   Problem: Self-Care: Goal: Ability to participate in self-care as condition permits will improve Outcome: Progressing   Problem: Self-Concept: Goal: Will verbalize positive feelings about self Outcome: Progressing   Problem: Safety: Goal: Ability to remain free from injury will improve Outcome: Progressing

## 2018-05-04 NOTE — Progress Notes (Signed)
Patient is alert and oriented x 4, denies pain and discomfort. Patient denies SI/HIAVH, interacting with peers and staff appropriately. Patient's thoughts are organized and coherent, mood is pleasant, speech soft non tangential. 15 minutes safety checks maintained will continue to monitor

## 2018-05-04 NOTE — Progress Notes (Signed)
Recreation Therapy Notes  Date: 05/04/2018  Time: 9:30 am   Location: Craft room   Behavioral response: N/A   Intervention Topic: Problem Solving  Discussion/Intervention: Patient did not attend group.   Clinical Observations/Feedback:  Patient did not attend group.   [name] LRT/CTRS        David Davila 05/04/2018 10:21 AM 

## 2018-05-04 NOTE — Tx Team (Signed)
Interdisciplinary Treatment and Diagnostic Plan Update  05/04/2018 Time of Session: 10:30am David Davila. MRN: [id number]  Principal Diagnosis: Undifferentiated schizophrenia (HCC)  Secondary Diagnoses: Principal Problem:   Undifferentiated schizophrenia (HCC)   Current Medications:  Current Facility-Administered Medications  Medication Dose Route Frequency Provider Last Rate Last Dose  . acetaminophen (TYLENOL) tablet 650 mg  650 mg Oral Q6H PRN Clapacs, John T, MD      . alum & mag hydroxide-simeth (MAALOX/MYLANTA) 200-200-20 MG/5ML suspension 30 mL  30 mL Oral Q4H PRN Clapacs, John T, MD      . benztropine (COGENTIN) tablet 1 mg  1 mg Oral BID PRN McNew, Ileene Hutchinson, MD   1 mg at 04/08/18 2048   Or  . benztropine mesylate (COGENTIN) injection 1 mg  1 mg Intramuscular BID PRN McNew, Ileene Hutchinson, MD      . benztropine (COGENTIN) tablet 1 mg  1 mg Oral QHS McNew, Ileene Hutchinson, MD   1 mg at 05/03/18 2226  . divalproex (DEPAKOTE) DR tablet 1,000 mg  1,000 mg Oral BH-q7a McNew, Ileene Hutchinson, MD   1,000 mg at 05/04/18 4098   And  . divalproex (DEPAKOTE) DR tablet 750 mg  750 mg Oral QHS McNew, Holly R, MD   750 mg at 05/03/18 2226  . haloperidol (HALDOL) tablet 5 mg  5 mg Oral Q6H PRN McNew, Ileene Hutchinson, MD       Or  . haloperidol lactate (HALDOL) injection 5 mg  5 mg Intramuscular Q6H PRN McNew, Ileene Hutchinson, MD   5 mg at 04/01/18 0819  . haloperidol (HALDOL) tablet 5 mg  5 mg Oral QHS McNew, Holly R, MD   5 mg at 05/03/18 2226  . hydrOXYzine (ATARAX/VISTARIL) tablet 50 mg  50 mg Oral TID PRN Clapacs, Jackquline Denmark, MD   50 mg at 04/23/18 2125  . LORazepam (ATIVAN) tablet 1 mg  1 mg Oral Q6H PRN Haskell Riling, MD   1 mg at 04/16/18 2134   Or  . LORazepam (ATIVAN) injection 1 mg  1 mg Intramuscular Q6H PRN Haskell Riling, MD   1 mg at 04/01/18 0820  . magnesium hydroxide (MILK OF MAGNESIA) suspension 30 mL  30 mL Oral Daily PRN Clapacs, John T, MD      . traZODone (DESYREL) tablet 100 mg  100 mg Oral QHS PRN  Clapacs, Jackquline Denmark, MD   100 mg at 05/03/18 2226   PTA Medications: Medications Prior to Admission  Medication Sig Dispense Refill Last Dose  . atorvastatin (LIPITOR) 10 MG tablet Take 1 tablet (10 mg total) by mouth daily at 6 PM. (Patient not taking: Reported on 11/16/2017) 30 tablet 0 Not Taking at Unknown time  . divalproex (DEPAKOTE ER) 500 MG 24 hr tablet Take 1,000 mg by mouth daily.   unknown at unknown  . hydrOXYzine (ATARAX/VISTARIL) 25 MG tablet Take 1 tablet (25 mg total) by mouth 3 (three) times daily. (Patient not taking: Reported on 11/16/2017) 30 tablet 0 Not Taking at Unknown time  . lamoTRIgine (LAMICTAL) 100 MG tablet Take 1 tablet (100 mg total) by mouth every evening. (Patient not taking: Reported on 11/16/2017) 30 tablet 0 Not Taking at Unknown time  . lamoTRIgine (LAMICTAL) 25 MG tablet Take 2 tablets (50 mg total) by mouth daily. (Patient not taking: Reported on 11/16/2017) 60 tablet 0 Not Taking at Unknown time  . loratadine (CLARITIN) 10 MG tablet Take 10 mg by mouth daily.   unknown at unknown  .  LORazepam (ATIVAN) 0.5 MG tablet Take 1 tablet (0.5 mg total) by mouth 2 (two) times daily. (Patient not taking: Reported on 11/16/2017) 60 tablet 0 Not Taking at Unknown time  . OLANZapine zydis (ZYPREXA) 5 MG disintegrating tablet Take 1 tablet (5 mg total) by mouth at bedtime. (Patient not taking: Reported on 11/16/2017) 30 tablet 0 Not Taking at Unknown time  . paliperidone (INVEGA SUSTENNA) 156 MG/ML SUSP injection Inject 1 mL (156 mg total) into the muscle every 28 (twenty-eight) days. NEXT DOSE DUE August 13, 2016 (Patient taking differently: Inject 156 mg into the muscle every 28 (twenty-eight) days. ) 0.9 mL 0   . traZODone (DESYREL) 100 MG tablet Take 1 tablet (100 mg total) by mouth at bedtime. (Patient taking differently: Take 50 mg by mouth at bedtime. ) 30 tablet 0 Not Taking at Unknown time    Patient Stressors: Medication change or noncompliance Substance abuse  Patient  Strengths: General fund of knowledge  Treatment Modalities: Medication Management, Group therapy, Case management,  1 to 1 session with clinician, Psychoeducation, Recreational therapy.   Physician Treatment Plan for Primary Diagnosis: Undifferentiated schizophrenia (HCC) Long Term Goal(s): Improvement in symptoms so as ready for discharge   Short Term Goals: Ability to identify changes in lifestyle to reduce recurrence of condition will improve  Medication Management: Evaluate patient's response, side effects, and tolerance of medication regimen.  Therapeutic Interventions: 1 to 1 sessions, Unit Group sessions and Medication administration.  Evaluation of Outcomes: Progressing   9/9:Schizoaffective disorder -Next [place] injection due on 04/30/18 -Continue Depakote 1000 mg qam and 750 mg qhs -Continue Haldol 5 mg dailyPt is in good mood today. He is smiling and less irritable. He is hoping to get an interview with a group home so he can get out of the hospital. He states taht he is feeling good. He states, "I'm not talking to myself anymore."    Physician Treatment Plan for Secondary Diagnosis: Principal Problem:   Undifferentiated schizophrenia (HCC)  Long Term Goal(s): Improvement in symptoms so as ready for discharge   Short Term Goals: Ability to identify changes in lifestyle to reduce recurrence of condition will improve     Medication Management: Evaluate patient's response, side effects, and tolerance of medication regimen.  Therapeutic Interventions: 1 to 1 sessions, Unit Group sessions and Medication administration.  Evaluation of Outcomes: Progressing   RN Treatment Plan for Primary Diagnosis: Undifferentiated schizophrenia (HCC) Long Term Goal(s): Knowledge of disease and therapeutic regimen to maintain health will improve  Short Term Goals: Ability to verbalize feelings will improve, Ability to identify and develop effective coping behaviors will improve  and Compliance with prescribed medications will improve  Medication Management: RN will administer medications as ordered by provider, will assess and evaluate patient's response and provide education to patient for prescribed medication. RN will report any adverse and/or side effects to prescribing provider.  Therapeutic Interventions: 1 on 1 counseling sessions, Psychoeducation, Medication administration, Evaluate responses to treatment, Monitor vital signs and CBGs as ordered, Perform/monitor CIWA, COWS, AIMS and Fall Risk screenings as ordered, Perform wound care treatments as ordered.  Evaluation of Outcomes: Progressing   LCSW Treatment Plan for Primary Diagnosis: Undifferentiated schizophrenia (HCC) Long Term Goal(s): Safe transition to appropriate next level of care at discharge, Engage patient in therapeutic group addressing interpersonal concerns.  Short Term Goals: Engage patient in aftercare planning with referrals and resources, Increase social support, Increase emotional regulation, Identify triggers associated with mental health/substance abuse issues and Increase skills for  wellness and recovery  Therapeutic Interventions: Assess for all discharge needs, 1 to 1 time with Social worker, Explore available resources and support systems, Assess for adequacy in community support network, Educate family and significant other(s) on suicide prevention, Complete Psychosocial Assessment, Interpersonal group therapy.  Evaluation of Outcomes: Progressing   Progress in Treatment: Attending groups: Yes. Participating in groups: Yes. Taking medication as prescribed: Yes. Toleration medication: Yes. Family/Significant other contact made: Yes, individual(s) contacted:  Patients Guardian Patient understands diagnosis: Yes. Discussing patient identified problems/goals with staff: Yes. Medical problems stabilized or resolved: Yes. Denies suicidal/homicidal ideation: Yes. Issues/concerns per  patient self-inventory: No. Other:   New problem(s) identified: No, Describe:  None  New Short Term/Long Term Goal(s): "Take care of my family."  Patient Goals:   "Take care of my family."  Discharge Plan or Barriers: To discharge to a new group home and engage with outpatient services or ACT Team  Reason for Continuation of Hospitalization: Medication stabilization  Estimated Length of Stay: 5-7 days Recreational Therapy: Patient Stressors: N/A Patient Goal: Patient will engage in interactions with peers and staff in pro-social manner at least 2x within 5 recreation therapy group sessions   Attendees: Patient:  05/04/2018 11:05 AM  Physician: Dr. Jennet Maduro, MD 05/04/2018 11:05 AM  Nursing: 05/04/2018 11:05 AM  RN Care Manager: 05/04/2018 11:05 AM  Social Worker: Johny Shears, LCSWA 05/04/2018 11:05 AM  Recreational Therapist: Danella Deis. Outlaw CTRS, LRT 05/04/2018 11:05 AM  Other: Jake Shark, LCSW 05/04/2018 11:05 AM  Other: Damian Leavell, Chaplin 05/04/2018 11:05 AM  Other:Elon Student/ Harvey- [facility] 05/04/2018 11:05 AM    Scribe for Treatment Team: Johny Shears, LCSW 05/04/2018 11:05 AM

## 2018-05-04 NOTE — [facility] Group Notes (Signed)
[facility] Group Notes:  (Nursing/MHT/Case Management/Adjunct)  Date:  05/04/2018  Time:  9:35 PM  Type of Therapy:  Group Therapy  Participation Level:  Active  Participation Quality:  Appropriate  Affect:  Appropriate  Cognitive:  Alert  Insight:  Good  Engagement in Group:  Engaged  Modes of Intervention:  Support  Summary of Progress/Problems:  David Davila 05/04/2018, 9:35 PM

## 2018-05-04 NOTE — Plan of Care (Addendum)
D: Pt denies SI/HI/AV hallucinations. Pt is pleasant and cooperative. Pt has been out of room ambulating in the hall. Patient voices no concerns. He did speak with his social worker concerning his discharge plan per his request. A:Patient offered support and encouragement. Patient encouraged to attend group. Patient with Q 15 minute checks in progress.  R:Patient remains safe on unit. Patient did not attend group. Monitoring continues. Problem: Education: Goal: Knowledge of [place] [facility] Education information/materials will improve Outcome: Progressing Goal: Emotional status will improve Outcome: Progressing Goal: Mental status will improve Outcome: Progressing Goal: Verbalization of understanding the information provided will improve Outcome: Progressing   Problem: Activity: Goal: Interest or engagement in activities will improve Outcome: Progressing Goal: Sleeping patterns will improve Outcome: Progressing   Problem: Coping: Goal: Ability to verbalize frustrations and anger appropriately will improve Outcome: Progressing Goal: Ability to demonstrate self-control will improve Outcome: Progressing   Problem: Health Behavior/Discharge Planning: Goal: Identification of resources available to assist in meeting health care needs will improve Outcome: Progressing Goal: Compliance with treatment plan for underlying cause of condition will improve Outcome: Progressing   Problem: Physical Regulation: Goal: Ability to maintain clinical measurements within normal limits will improve Outcome: Progressing   Problem: Safety: Goal: Periods of time without injury will increase Outcome: Progressing   Problem: Activity: Goal: Will verbalize the importance of balancing activity with adequate rest periods Outcome: Progressing   Problem: Education: Goal: Will be free of psychotic symptoms Outcome: Progressing Goal: Knowledge of the prescribed therapeutic regimen will improve Outcome:  Progressing   Problem: Coping: Goal: Coping ability will improve Outcome: Progressing Goal: Will verbalize feelings Outcome: Progressing   Problem: Health Behavior/Discharge Planning: Goal: Compliance with prescribed medication regimen will improve Outcome: Progressing   Problem: Nutritional: Goal: Ability to achieve adequate nutritional intake will improve Outcome: Progressing   Problem: Role Relationship: Goal: Ability to communicate needs accurately will improve Outcome: Progressing Goal: Ability to interact with others will improve Outcome: Progressing   Problem: Safety: Goal: Ability to redirect hostility and anger into socially appropriate behaviors will improve Outcome: Progressing Goal: Ability to remain free from injury will improve Outcome: Progressing   Problem: Self-Care: Goal: Ability to participate in self-care as condition permits will improve Outcome: Progressing   Problem: Self-Concept: Goal: Will verbalize positive feelings about self Outcome: Progressing   Problem: Safety: Goal: Ability to remain free from injury will improve Outcome: Progressing

## 2018-05-04 NOTE — Plan of Care (Signed)
  Problem: Coping: Goal: Ability to demonstrate self-control will improve Outcome: Progressing  Patient able to demonstrate self control o the unit.

## 2018-05-05 NOTE — Progress Notes (Signed)
[facility]  05/05/2018 3:49 PM Melene MullerPaul Stephenson Whidby Jr.  MRN:  [id number] Subjective:  Pt states that he is doing fine. He did tell staff that he did not want to go to group home because he wants his own apartment. We discussed this today that he needs to be able to prove to his guardian that he is able to live on his own by being stable for a periods of time and taking his medications. He agrees with this and agrees with going to group home. He denies SI, HI, AH, VH. He has no complaints today.   Principal Problem: Undifferentiated schizophrenia (HCC) Diagnosis:   Patient Active Problem List   Diagnosis Date Noted  . Undifferentiated schizophrenia (HCC) [F20.3] 03/26/2018    Priority: High  . TBI (traumatic brain injury) (HCC) [S06.9X9A] 07/10/2016  . Vitamin B12 deficiency [E53.8] 07/10/2016  . Noncompliance with medication regimen [Z91.14] 06/25/2016  . Cocaine use disorder, mild, abuse (HCC) [F14.10] 06/25/2016  . Cannabis use disorder, mild, abuse [F12.10] 06/25/2016  . 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 Acute psychosis (HCC)   . Antisocial personality disorder (HCC)   . Cannabis abuse    Past  . Paranoid schizophrenia (HCC)   . Schizoaffective disorder, bipolar type (HCC)    History reviewed. No pertinent surgical history. Family History: History reviewed. No pertinent family history. Family Psychiatric  History: See H&P Social History:  Social History   Substance and Sexual Activity  Alcohol Use No     Social History   Substance and Sexual Activity  Drug Use Not Currently    Social History   Socioeconomic History  . Marital status: Single    Spouse name: Not on file  . Number of children: Not on file  . Years of education: Not on file  . Highest education level: Not on file  Occupational  History  . Not on file  Social Needs  . Financial resource strain: Not on file  . Food insecurity:    Worry: Not on file    Inability: Not on file  . Transportation needs:    Medical: Not on file    Non-medical: Not on file  Tobacco Use  . Smoking status: Former Smoker    Packs/day: 1.00    Types: Cigarettes  . Smokeless tobacco: Never Used  Substance and Sexual Activity  . Alcohol use: No  . Drug use: Not Currently  . Sexual activity: Not Currently  Lifestyle  . Physical activity:    Days per week: Not on file    Minutes per session: Not on file  . Stress: Not on file  Relationships  . Social connections:    Talks on phone: Not on file    Gets together: Not on file    Attends religious service: Not on file    Active member of club or organization: Not on file    Attends meetings of clubs or organizations: Not on file    Relationship status: Not on file  Other Topics Concern  . Not on file  Social History Narrative  . Not on file   Additional Social History:                         Sleep: Good  Appetite:  Good  Current Medications: Current Facility-Administered  Medications  Medication Dose Route Frequency Provider Last Rate Last Dose  . acetaminophen (TYLENOL) tablet 650 mg  650 mg Oral Q6H PRN Clapacs, John T, MD      . alum & mag hydroxide-simeth (MAALOX/MYLANTA) 200-200-20 MG/5ML suspension 30 mL  30 mL Oral Q4H PRN Clapacs, John T, MD      . benztropine (COGENTIN) tablet 1 mg  1 mg Oral BID PRN Wilhelmena Zea, Ileene Hutchinson, MD   1 mg at 04/08/18 2048   Or  . benztropine mesylate (COGENTIN) injection 1 mg  1 mg Intramuscular BID PRN Ragna Kramlich, Ileene Hutchinson, MD      . benztropine (COGENTIN) tablet 1 mg  1 mg Oral QHS Tauheedah Bok, Ileene Hutchinson, MD   1 mg at 05/04/18 2103  . divalproex (DEPAKOTE) DR tablet 1,000 mg  1,000 mg Oral BH-q7a Kanijah Groseclose, Ileene Hutchinson, MD   1,000 mg at 05/05/18 3086   And  . divalproex (DEPAKOTE) DR tablet 750 mg  750 mg Oral QHS Oakleigh Hesketh R, MD   750 mg at 05/04/18  2103  . haloperidol (HALDOL) tablet 5 mg  5 mg Oral Q6H PRN Pallavi Clifton, Ileene Hutchinson, MD       Or  . haloperidol lactate (HALDOL) injection 5 mg  5 mg Intramuscular Q6H PRN Rue Tinnel, Ileene Hutchinson, MD   5 mg at 04/01/18 0819  . haloperidol (HALDOL) tablet 5 mg  5 mg Oral QHS Nahia Nissan, Ileene Hutchinson, MD   5 mg at 05/04/18 2102  . hydrOXYzine (ATARAX/VISTARIL) tablet 50 mg  50 mg Oral TID PRN Clapacs, Jackquline Denmark, MD   50 mg at 04/23/18 2125  . LORazepam (ATIVAN) tablet 1 mg  1 mg Oral Q6H PRN Haskell Riling, MD   1 mg at 04/16/18 2134   Or  . LORazepam (ATIVAN) injection 1 mg  1 mg Intramuscular Q6H PRN Haskell Riling, MD   1 mg at 04/01/18 0820  . magnesium hydroxide (MILK OF MAGNESIA) suspension 30 mL  30 mL Oral Daily PRN Clapacs, John T, MD      . traZODone (DESYREL) tablet 100 mg  100 mg Oral QHS PRN Clapacs, Jackquline Denmark, MD   100 mg at 05/04/18 2102    Lab Results: No results found for this or any previous visit (from the past 48 hour(s)).  Blood Alcohol level:  Lab Results  Component Value Date   ETH <10 03/25/2018   ETH <10 12/06/2017    Metabolic Disorder Labs: Lab Results  Component Value Date   HGBA1C 5.4 03/25/2018   MPG 108.28 03/25/2018   No results found for: PROLACTIN Lab Results  Component Value Date   CHOL 184 03/25/2018   TRIG 43 03/25/2018   HDL 50 03/25/2018   CHOLHDL 3.7 03/25/2018   VLDL 9 03/25/2018   LDLCALC 125 (H) 03/25/2018    Physical Findings: AIMS: Facial and Oral Movements Muscles of Facial Expression: None, normal Lips and Perioral Area: None, normal Jaw: None, normal Tongue: None, normal,Extremity Movements Upper (arms, wrists, hands, fingers): None, normal Lower (legs, knees, ankles, toes): None, normal, Trunk Movements Neck, shoulders, hips: None, normal, Overall Severity Severity of abnormal movements (highest score from questions above): None, normal Incapacitation due to abnormal movements: None, normal Patient's awareness of abnormal movements (rate only patient's  report): No Awareness, Dental Status Current problems with teeth and/or dentures?: No Does patient usually wear dentures?: No  CIWA:  CIWA-Ar Total: 0 COWS:  COWS Total Score: 0  Musculoskeletal: Strength & Muscle Tone: within normal  limits Gait & Station: normal Patient leans: N/A  Psychiatric Specialty Exam: Physical Exam  Nursing note and vitals reviewed.   Review of Systems  All other systems reviewed and are negative.   Blood pressure 104/60, pulse 71, temperature 98.2 F (36.8 C), temperature source Oral, resp. rate 18, height 5\' 8"  (1.727 m), weight 58.1 kg, SpO2 100 %.Body mass index is 19.46 kg/m.  General Appearance: Casual  Eye Contact:  Fair  Speech:  Clear and Coherent  Volume:  Normal  Mood:  Euthymic  Affect:  Appropriate  Thought Process:  Coherent and Goal Directed  Orientation:  Full (Time, Place, and Person)  Thought Content:  Logical  Suicidal Thoughts:  No  Homicidal Thoughts:  No  Memory:  Immediate;   Fair  Judgement:  Impaired  Insight:  Lacking  Psychomotor Activity:  Normal  Concentration:  Concentration: Fair  Recall:  [employer] of Knowledge:  Fair  Language:  Fair  Akathisia:  No      Assets:  Resilience  ADL's:  Intact  Cognition:  WNL  Sleep:  Number of Hours: 6.75     Treatment Plan Summary: 37 yo male admitted due to psychosis. Psychosis is stable and has been calm on the unit. Awaiting placement to his new group home.   Plan:  -Next [place] 234 injection due on 10/16 -continue Depakote 1000 mg qam and 750 mg qhs -Continue Haldol 5 mg qhs  Dispo -Awaiting placement into new group home. Hopefully this week. He was also accepted into ACT team   Haskell Riling, MD 05/05/2018, 3:49 PM

## 2018-05-05 NOTE — Plan of Care (Addendum)
Patient found asleep in bed upon my arrival. Patient is visible and somewhat social this evening. Attends group. Mood and affect remain the same. Minimally verbal but polite and cooperative. Denies SI/HI/AVH. Denies pain. Eating and voiding adequately. Compliant with HS medications and staff direction. Q 15 minute checks maintained. Will continue to monitor throughout the shift. Patient slept 5.75 hours. No apparent distress. Will endorse care to oncoming shift.  Problem: Education: Goal: Emotional status will improve Outcome: Progressing Goal: Mental status will improve Outcome: Progressing   Problem: Activity: Goal: Interest or engagement in activities will improve Outcome: Progressing Goal: Sleeping patterns will improve Outcome: Progressing   Problem: Education: Goal: Knowledge of the prescribed therapeutic regimen will improve Outcome: Progressing   Problem: Coping: Goal: Coping ability will improve Outcome: Progressing

## 2018-05-05 NOTE — Progress Notes (Signed)
D- Patient alert and oriented. Patient presents in a pleasant mood on assessment stating that he slept good last night and reported to this writer "I feel ok". Patient denies SI, HI, AVH, and pain at this time. Patient also denies depression and anxiety stating "I don't have any". Patient's goal for today is "not being stressed out", in which he will "think positive", in order to accomplish this goal.  A- Support and encouragement provided.  Routine safety checks conducted every 15 minutes.  Patient informed to notify staff with problems or concerns.  R- Patient contracts for safety at this time. Patient compliant with treatment plan. Patient receptive, calm, and cooperative. Patient interacts well with others on the unit.  Patient remains safe at this time.

## 2018-05-05 NOTE — Progress Notes (Signed)
Patient ID: David Mulleraul Stephenson Cassel Jr., male   DOB: 06/30/1981, 37 y.o.   MRN: [id number] PER STATE REGULATIONS 482.30  THIS CHART WAS REVIEWED FOR MEDICAL NECESSITY WITH RESPECT TO THE PATIENT'S ADMISSION/ DURATION OF STAY.  NEXT REVIEW DATE: 05/09/2018  Willa RoughJENNIFER JONES Hayleen Clinkscales, RN, BSN CASE MANAGER

## 2018-05-05 NOTE — Plan of Care (Signed)
Patient has verbalized understanding of his prescribed therapeutic regimen as well as the general information that's been provided to him and has not voiced any further questions or concerns at this time. Patient denies SI/HI/AVH as well as any signs/symptoms of depression and anxiety reporting "I don't have any". Patient has been isolative to his room and sleeping majority of the morning, however, he did come out for meals before returning to his room. Patient has the ability to verbalize his anger and frustrations into appropriate behaviors and has been demonstrating self-control on the unit. Patient has the ability to identify the available resources that can assist him in meeting his health-care needs stating to this writer that one of his goals for today is to "work on discharge". Patient has been working towards maintaining his clinical measurements within normal limits and has achieved adequate nutrition. Patient participates in self-care when appropriate for him. Patient has been free from injury thus far and remains safe on this unit at this time.  Problem: Education: Goal: Knowledge of [place] [facility] Education information/materials will improve Outcome: Progressing Goal: Emotional status will improve Outcome: Progressing Goal: Mental status will improve Outcome: Progressing Goal: Verbalization of understanding the information provided will improve Outcome: Progressing   Problem: Activity: Goal: Interest or engagement in activities will improve Outcome: Progressing Goal: Sleeping patterns will improve Outcome: Progressing   Problem: Coping: Goal: Ability to verbalize frustrations and anger appropriately will improve Outcome: Progressing Goal: Ability to demonstrate self-control will improve Outcome: Progressing   Problem: Health Behavior/Discharge Planning: Goal: Identification of resources available to assist in meeting health care needs will improve Outcome:  Progressing Goal: Compliance with treatment plan for underlying cause of condition will improve Outcome: Progressing   Problem: Physical Regulation: Goal: Ability to maintain clinical measurements within normal limits will improve Outcome: Progressing   Problem: Safety: Goal: Periods of time without injury will increase Outcome: Progressing   Problem: Activity: Goal: Will verbalize the importance of balancing activity with adequate rest periods Outcome: Progressing   Problem: Education: Goal: Will be free of psychotic symptoms Outcome: Progressing Goal: Knowledge of the prescribed therapeutic regimen will improve Outcome: Progressing   Problem: Coping: Goal: Coping ability will improve Outcome: Progressing Goal: Will verbalize feelings Outcome: Progressing   Problem: Health Behavior/Discharge Planning: Goal: Compliance with prescribed medication regimen will improve Outcome: Progressing   Problem: Nutritional: Goal: Ability to achieve adequate nutritional intake will improve Outcome: Progressing   Problem: Role Relationship: Goal: Ability to communicate needs accurately will improve Outcome: Progressing Goal: Ability to interact with others will improve Outcome: Progressing   Problem: Safety: Goal: Ability to redirect hostility and anger into socially appropriate behaviors will improve Outcome: Progressing Goal: Ability to remain free from injury will improve Outcome: Progressing   Problem: Self-Care: Goal: Ability to participate in self-care as condition permits will improve Outcome: Progressing   Problem: Self-Concept: Goal: Will verbalize positive feelings about self Outcome: Progressing   Problem: Safety: Goal: Ability to remain free from injury will improve Outcome: Progressing

## 2018-05-05 NOTE — Progress Notes (Signed)
Recreation Therapy Notes  Date: 05/05/2018  Time: 9:30 pm   Location: Craft Room   Behavioral response: N/A   Intervention Topic: Emotions  Discussion/Intervention: Patient did not attend group.   Clinical Observations/Feedback:  Patient did not attend group.   Barney Gertsch LRT/CTRS         Shanikqua Zarzycki 05/05/2018 11:32 AM 

## 2018-05-05 NOTE — [facility] Group Notes (Signed)
[facility] LCSW Group Therapy Note  Date/Time: 05/05/18, 1300  Type of Therapy/Topic:  Group Therapy:  Feelings about Diagnosis  Participation Level:  Did Not Attend   Mood:   Description of Group:    This group will allow patients to explore their thoughts and feelings about diagnoses they have received. Patients will be guided to explore their level of understanding and acceptance of these diagnoses. Facilitator will encourage patients to process their thoughts and feelings about the reactions of others to their diagnosis, and will guide patients in identifying ways to discuss their diagnosis with significant others in their lives. This group will be process-oriented, with patients participating in exploration of their own experiences as well as giving and receiving support and challenge from other group members.   Therapeutic Goals: 1. Patient will demonstrate understanding of diagnosis as evidence by identifying two or more symptoms of the disorder:  2. Patient will be able to express two feelings regarding the diagnosis 3. Patient will demonstrate ability to communicate their needs through discussion and/or role plays  Summary of Patient Progress:        Therapeutic Modalities:   Cognitive Behavioral Therapy Brief Therapy Feelings Identification   Greg Ulysses Alper, LCSW 

## 2018-05-05 NOTE — Plan of Care (Addendum)
Patient found in bed awake upon my arrival. Patient is visible and social later in the evening. Patient reports mood is "fine." Affect remains blunted. Minimally verbal. Denies SI/HI/AVH. Denies pain. Reports eating and voiding adequately. Compliant with HS medications and staff direction. Given Trazodone for sleep with positive results. Q 15 minute checks maintained. Will continue to monitor throughout the shift. Patient slept 6.75 hours. No apparent distress. Will endorse care to oncoming shift.  Problem: Education: Goal: Emotional status will improve Outcome: Progressing Goal: Mental status will improve Outcome: Progressing   Problem: Activity: Goal: Interest or engagement in activities will improve Outcome: Progressing Goal: Sleeping patterns will improve Outcome: Progressing   Problem: Coping: Goal: Ability to demonstrate self-control will improve Outcome: Progressing

## 2018-05-06 NOTE — Progress Notes (Signed)
Recreation Therapy Notes  Date: 05/06/2018  Time: 9:30 am   Location: Craft room   Behavioral response: N/A   Intervention Topic: Leisure  Discussion/Intervention: Patient did not attend group.   Clinical Observations/Feedback:  Patient did not attend group.   Naia Ruff LRT/CTRS        David Davila 05/06/2018 11:24 AM 

## 2018-05-06 NOTE — Progress Notes (Signed)
[facility] MD Progress Note  05/06/2018 3:37 PM David Davila.  MRN:  [id number] Subjective:  Pt states that he is doing well. Isolates to his room. He has no complaints today and overall feels well phys;icall and emotionally. He denies Ah or VH. Denies SI, HI.   Principal Problem: Undifferentiated schizophrenia (HCC) Diagnosis:   Patient Active Problem List   Diagnosis Date Noted  . Undifferentiated schizophrenia (HCC) [F20.3] 03/26/2018    Priority: High  . TBI (traumatic brain injury) (HCC) [S06.9X9A] 07/10/2016  . Vitamin B12 deficiency [E53.8] 07/10/2016  . Noncompliance with medication regimen [Z91.14] 06/25/2016  . Cocaine use disorder, mild, abuse (HCC) [F14.10] 06/25/2016  . Cannabis use disorder, mild, abuse [F12.10] 06/25/2016  . Paranoid schizophrenia (HCC) [F20.0] 06/24/2016   Total Time spent with patient: 15 minutes  Past Psychiatric History: See h&P  Past Medical History:  Past Medical History:  Diagnosis Date  . Acute psychosis (HCC)   . Antisocial personality disorder (HCC)   . Cannabis abuse    Past  . Paranoid schizophrenia (HCC)   . Schizoaffective disorder, bipolar type (HCC)    History reviewed. No pertinent surgical history. Family History: History reviewed. No pertinent family history. Family Psychiatric  History: See h&P Social History:  Social History   Substance and Sexual Activity  Alcohol Use No     Social History   Substance and Sexual Activity  Drug Use Not Currently    Social History   Socioeconomic History  . Marital status: Single    Spouse name: Not on file  . Number of children: Not on file  . Years of education: Not on file  . Highest education level: Not on file  Occupational History  . Not on file  Social Needs  . Financial resource strain: Not on file  . Food insecurity:    Worry: Not on file    Inability: Not on file  . Transportation needs:    Medical: Not on file    Non-medical: Not on file  Tobacco Use   . Smoking status: Former Smoker    Packs/day: 1.00    Types: Cigarettes  . Smokeless tobacco: Never Used  Substance and Sexual Activity  . Alcohol use: No  . Drug use: Not Currently  . Sexual activity: Not Currently  Lifestyle  . Physical activity:    Days per week: Not on file    Minutes per session: Not on file  . Stress: Not on file  Relationships  . Social connections:    Talks on phone: Not on file    Gets together: Not on file    Attends religious service: Not on file    Active member of club or organization: Not on file    Attends meetings of clubs or organizations: Not on file    Relationship status: Not on file  Other Topics Concern  . Not on file  Social History Narrative  . Not on file   Additional Social History:                         Sleep: Good  Appetite:  Good  Current Medications: Current Facility-Administered Medications  Medication Dose Route Frequency Provider Last Rate Last Dose  . acetaminophen (TYLENOL) tablet 650 mg  650 mg Oral Q6H PRN Clapacs, John T, MD      . alum & mag hydroxide-simeth (MAALOX/MYLANTA) 200-200-20 MG/5ML suspension 30 mL  30 mL Oral Q4H PRN Clapacs, Jackquline Denmark, MD      .  benztropine (COGENTIN) tablet 1 mg  1 mg Oral BID PRN McNew, Ileene Hutchinson, MD   1 mg at 04/08/18 2048   Or  . benztropine mesylate (COGENTIN) injection 1 mg  1 mg Intramuscular BID PRN McNew, Ileene Hutchinson, MD      . benztropine (COGENTIN) tablet 1 mg  1 mg Oral QHS McNew, Ileene Hutchinson, MD   1 mg at 05/05/18 2109  . divalproex (DEPAKOTE) DR tablet 1,000 mg  1,000 mg Oral BH-q7a McNew, Ileene Hutchinson, MD   1,000 mg at 05/06/18 1610   And  . divalproex (DEPAKOTE) DR tablet 750 mg  750 mg Oral QHS McNew, Holly R, MD   750 mg at 05/05/18 2110  . haloperidol (HALDOL) tablet 5 mg  5 mg Oral Q6H PRN McNew, Ileene Hutchinson, MD       Or  . haloperidol lactate (HALDOL) injection 5 mg  5 mg Intramuscular Q6H PRN McNew, Ileene Hutchinson, MD   5 mg at 04/01/18 0819  . haloperidol (HALDOL) tablet 5 mg   5 mg Oral QHS McNew, Ileene Hutchinson, MD   5 mg at 05/05/18 2110  . hydrOXYzine (ATARAX/VISTARIL) tablet 50 mg  50 mg Oral TID PRN Clapacs, Jackquline Denmark, MD   50 mg at 04/23/18 2125  . LORazepam (ATIVAN) tablet 1 mg  1 mg Oral Q6H PRN Haskell Riling, MD   1 mg at 04/16/18 2134   Or  . LORazepam (ATIVAN) injection 1 mg  1 mg Intramuscular Q6H PRN Haskell Riling, MD   1 mg at 04/01/18 0820  . magnesium hydroxide (MILK OF MAGNESIA) suspension 30 mL  30 mL Oral Daily PRN Clapacs, John T, MD      . traZODone (DESYREL) tablet 100 mg  100 mg Oral QHS PRN Clapacs, Jackquline Denmark, MD   100 mg at 05/05/18 2110    Lab Results: No results found for this or any previous visit (from the past 48 hour(s)).  Blood Alcohol level:  Lab Results  Component Value Date   ETH <10 03/25/2018   ETH <10 12/06/2017    Metabolic Disorder Labs: Lab Results  Component Value Date   HGBA1C 5.4 03/25/2018   MPG 108.28 03/25/2018   No results found for: PROLACTIN Lab Results  Component Value Date   CHOL 184 03/25/2018   TRIG 43 03/25/2018   HDL 50 03/25/2018   CHOLHDL 3.7 03/25/2018   VLDL 9 03/25/2018   LDLCALC 125 (H) 03/25/2018    Physical Findings: AIMS: Facial and Oral Movements Muscles of Facial Expression: None, normal Lips and Perioral Area: None, normal Jaw: None, normal Tongue: None, normal,Extremity Movements Upper (arms, wrists, hands, fingers): None, normal Lower (legs, knees, ankles, toes): None, normal, Trunk Movements Neck, shoulders, hips: None, normal, Overall Severity Severity of abnormal movements (highest score from questions above): None, normal Incapacitation due to abnormal movements: None, normal Patient's awareness of abnormal movements (rate only patient's report): No Awareness, Dental Status Current problems with teeth and/or dentures?: No Does patient usually wear dentures?: No  CIWA:  CIWA-Ar Total: 0 COWS:  COWS Total Score: 0  Musculoskeletal: Strength & Muscle Tone: within normal  limits Gait & Station: normal Patient leans: N/A  Psychiatric Specialty Exam: Physical Exam  Nursing note and vitals reviewed.   Review of Systems  All other systems reviewed and are negative.   Blood pressure 110/62, pulse 77, temperature 98 F (36.7 C), temperature source Oral, resp. rate 18, height 5\' 8"  (1.727 m), weight 58.1 kg, SpO2  100 %.Body mass index is 19.46 kg/m.  General Appearance: Casual  Eye Contact:  Fair  Speech:  Clear and Coherent  Volume:  Decreased  Mood:  Euthymic  Affect:  Constricted  Thought Process:  Coherent and Goal Directed  Orientation:  Full (Time, Place, and Person)  Thought Content:  Logical  Suicidal Thoughts:  No  Homicidal Thoughts:  No  Memory:  Immediate;   Fair  Judgement:  Fair  Insight:  Lacking  Psychomotor Activity:  Normal  Concentration:  Concentration: Fair  Recall:  [employer] of Knowledge:  Fair  Language:  Fair  Akathisia:  No      Assets:  Resilience  ADL's:  Intact  Cognition:  WNL  Sleep:  Number of Hours: 5.75     Treatment Plan Summary: 37 yo male admitted due to psychosis. Awaiting placement into group home  Plan:  Next [place] injection due on 10/16 -Continue Depakote 1000 mg qam and 750 mg qhs -Continue Haldol 5 mg qhs  Dispo -Awaiting placement into new group home  Haskell Riling, MD 05/06/2018, 3:37 PM

## 2018-05-06 NOTE — Progress Notes (Signed)
D- Patient alert and oriented. Patient presents in a pleasant mood on assessment stating to this writer that he is doing ok and had no major complaints or concerns. Patient denies SI, HI, AVH, and pain at this time. Patient also denies depression and anxiety, however, he reports a "2/10" for both on his self-inventory. Patient's goal for today is to "get discharged", in which he will "stay positive" in order to accomplish his goal.   A- Support and encouragement provided.  Routine safety checks conducted every 15 minutes.  Patient informed to notify staff with problems or concerns.  R- Patient contracts for safety at this time. Patient compliant with medications and treatment plan. Patient receptive, calm, and cooperative. Patient interacts well with others on the unit.  Patient remains safe at this time.

## 2018-05-06 NOTE — Plan of Care (Signed)
Patient has verbalized understanding of his prescribed therapeutic regimen as well as the general information that's been provided to him and has not voiced any further questions or concerns at this time. Patient denies SI/HI/AVH as well as any signs/symptoms of depression and anxiety reporting. Patient has been isolative to his room and sleeping majority of the morning, however, he does come out for meals before returning to his room. Patient has the ability to verbalize his anger and frustrations into appropriate behaviors and has been demonstrating self-control on the unit. Patient has the ability to identify the available resources that can assist him in meeting his health-care needs. Patient has been working towards maintaining his clinical measurements within normal limits and has achieved adequate nutrition. Patient participates in self-care when appropriate for him. Patient has been free from injury thus far and remains safe on this unit at this time.  Problem: Education: Goal: Knowledge of [place] [facility] Education information/materials will improve Outcome: Progressing Goal: Emotional status will improve Outcome: Progressing Goal: Mental status will improve Outcome: Progressing Goal: Verbalization of understanding the information provided will improve Outcome: Progressing   Problem: Activity: Goal: Interest or engagement in activities will improve Outcome: Progressing Goal: Sleeping patterns will improve Outcome: Progressing   Problem: Coping: Goal: Ability to verbalize frustrations and anger appropriately will improve Outcome: Progressing Goal: Ability to demonstrate self-control will improve Outcome: Progressing   Problem: Health Behavior/Discharge Planning: Goal: Identification of resources available to assist in meeting health care needs will improve Outcome: Progressing Goal: Compliance with treatment plan for underlying cause of condition will improve Outcome:  Progressing   Problem: Physical Regulation: Goal: Ability to maintain clinical measurements within normal limits will improve Outcome: Progressing   Problem: Safety: Goal: Periods of time without injury will increase Outcome: Progressing   Problem: Activity: Goal: Will verbalize the importance of balancing activity with adequate rest periods Outcome: Progressing   Problem: Education: Goal: Will be free of psychotic symptoms Outcome: Progressing Goal: Knowledge of the prescribed therapeutic regimen will improve Outcome: Progressing   Problem: Coping: Goal: Coping ability will improve Outcome: Progressing Goal: Will verbalize feelings Outcome: Progressing   Problem: Health Behavior/Discharge Planning: Goal: Compliance with prescribed medication regimen will improve Outcome: Progressing   Problem: Nutritional: Goal: Ability to achieve adequate nutritional intake will improve Outcome: Progressing   Problem: Role Relationship: Goal: Ability to communicate needs accurately will improve Outcome: Progressing Goal: Ability to interact with others will improve Outcome: Progressing   Problem: Safety: Goal: Ability to redirect hostility and anger into socially appropriate behaviors will improve Outcome: Progressing Goal: Ability to remain free from injury will improve Outcome: Progressing   Problem: Self-Care: Goal: Ability to participate in self-care as condition permits will improve Outcome: Progressing   Problem: Self-Concept: Goal: Will verbalize positive feelings about self Outcome: Progressing   Problem: Safety: Goal: Ability to remain free from injury will improve Outcome: Progressing

## 2018-05-07 NOTE — Progress Notes (Signed)
Recreation Therapy Notes  Date: 05/07/2018  Time: 9:30 am   Location: Craft room   Behavioral response: N/A   Intervention Topic: Communication  Discussion/Intervention: Patient did not attend group.   Clinical Observations/Feedback:  Patient did not attend group.   Karthikeya Funke LRT/CTRS        Chizaram Latino 05/07/2018 11:03 AM

## 2018-05-07 NOTE — Plan of Care (Signed)
  Problem: Education: Goal: Knowledge of [place] [facility] Education information/materials will improve Outcome: Progressing Goal: Emotional status will improve Outcome: Progressing Goal: Mental status will improve Outcome: Progressing Goal: Verbalization of understanding the information provided will improve Outcome: Progressing   Problem: Activity: Goal: Interest or engagement in activities will improve Outcome: Progressing Goal: Sleeping patterns will improve Outcome: Progressing   Problem: Coping: Goal: Ability to verbalize frustrations and anger appropriately will improve Outcome: Progressing Goal: Ability to demonstrate self-control will improve Outcome: Progressing   Problem: Health Behavior/Discharge Planning: Goal: Identification of resources available to assist in meeting health care needs will improve Outcome: Progressing Goal: Compliance with treatment plan for underlying cause of condition will improve Outcome: Progressing   Problem: Physical Regulation: Goal: Ability to maintain clinical measurements within normal limits will improve Outcome: Progressing   Problem: Safety: Goal: Periods of time without injury will increase Outcome: Progressing   Problem: Activity: Goal: Will verbalize the importance of balancing activity with adequate rest periods Outcome: Progressing   Problem: Education: Goal: Will be free of psychotic symptoms Outcome: Progressing Goal: Knowledge of the prescribed therapeutic regimen will improve Outcome: Progressing   Problem: Coping: Goal: Coping ability will improve Outcome: Progressing Goal: Will verbalize feelings Outcome: Progressing   Problem: Health Behavior/Discharge Planning: Goal: Compliance with prescribed medication regimen will improve Outcome: Progressing   Problem: Nutritional: Goal: Ability to achieve adequate nutritional intake will improve Outcome: Progressing   Problem: Role Relationship: Goal:  Ability to communicate needs accurately will improve Outcome: Progressing Goal: Ability to interact with others will improve Outcome: Progressing   Problem: Safety: Goal: Ability to redirect hostility and anger into socially appropriate behaviors will improve Outcome: Progressing Goal: Ability to remain free from injury will improve Outcome: Progressing   Problem: Self-Care: Goal: Ability to participate in self-care as condition permits will improve Outcome: Progressing   Problem: Self-Concept: Goal: Will verbalize positive feelings about self Outcome: Progressing   Problem: Safety: Goal: Ability to remain free from injury will improve Outcome: Progressing   

## 2018-05-07 NOTE — [facility] Group Notes (Signed)
LCSW Group Therapy Note  05/07/2018 1:00 pm  Type of Therapy/Topic:  Group Therapy:  Balance in Life  Participation Level:  Did Not Attend  Description of Group:    This group will address the concept of balance and how it feels and looks when one is unbalanced. Patients will be encouraged to process areas in their lives that are out of balance and identify reasons for remaining unbalanced. Facilitators will guide patients in utilizing problem-solving interventions to address and correct the stressor making their life unbalanced. Understanding and applying boundaries will be explored and addressed for obtaining and maintaining a balanced life. Patients will be encouraged to explore ways to assertively make their unbalanced needs known to significant others in their lives, using other group members and facilitator for support and feedback.  Therapeutic Goals: 1. Patient will identify two or more emotions or situations they have that consume much of in their lives. 2. Patient will identify signs/triggers that life has become out of balance:  3. Patient will identify two ways to set boundaries in order to achieve balance in their lives:  4. Patient will demonstrate ability to communicate their needs through discussion and/or role plays  Summary of Patient Progress:      Therapeutic Modalities:   Cognitive Behavioral Therapy Solution-Focused Therapy Assertiveness Training  Alease Frame, LCSW 05/07/2018 3:30 PM

## 2018-05-07 NOTE — Progress Notes (Signed)
Nursing note 7p-7a  Pt observed interacting with peers on unit this shift. Displayed a flat affect and flat but pleasant mood upon interaction with this Clinical research associate. Pt denies pain ,denies SI/HI, and also denies any audio or visual hallucinations at this time. Complains of insomnia prn administered per MD order, see MAR for prn medication administration.  Pt is able to verbally contract for safety with this RN. Goal: "to focus on my discharge"  Pt is now resting in bed with eyes closed, with no signs or symptoms of pain or distress noted. Pt continues to remain safe on the unit and is observed by rounding every 15 min. RN will continue to monitor.

## 2018-05-07 NOTE — Progress Notes (Signed)
[facility] MD Progress Note  05/07/2018 2:02 PM David Davila.  MRN:  [id number] Subjective:  Pt states that he is doing fine. HE is anxious to get out of the hospital. He has been very calm and cooperative with no outbursts at all. He is eating well. He denies having any physical complaints and feels well. He states that the first thing he is going to do when he gets otu is get money from his guardian and buy some shoes and a winter coat.   Principal Problem: Undifferentiated schizophrenia (HCC) Diagnosis:   Patient Active Problem List   Diagnosis Date Noted  . Undifferentiated schizophrenia (HCC) [F20.3] 03/26/2018    Priority: High  . TBI (traumatic brain injury) (HCC) [S06.9X9A] 07/10/2016  . Vitamin B12 deficiency [E53.8] 07/10/2016  . Noncompliance with medication regimen [Z91.14] 06/25/2016  . Cocaine use disorder, mild, abuse (HCC) [F14.10] 06/25/2016  . Cannabis use disorder, mild, abuse [F12.10] 06/25/2016  . Paranoid schizophrenia (HCC) [F20.0] 06/24/2016   Total Time spent with patient: 15 minutes  Past Psychiatric History: See H&p  Past Medical History:  Past Medical History:  Diagnosis Date  . Acute psychosis (HCC)   . Antisocial personality disorder (HCC)   . Cannabis abuse    Past  . Paranoid schizophrenia (HCC)   . Schizoaffective disorder, bipolar type (HCC)    History reviewed. No pertinent surgical history. Family History: History reviewed. No pertinent family history. Family Psychiatric  History: See H&P Social History:  Social History   Substance and Sexual Activity  Alcohol Use No     Social History   Substance and Sexual Activity  Drug Use Not Currently    Social History   Socioeconomic History  . Marital status: Single    Spouse name: Not on file  . Number of children: Not on file  . Years of education: Not on file  . Highest education level: Not on file  Occupational History  . Not on file  Social Needs  . Financial resource  strain: Not on file  . Food insecurity:    Worry: Not on file    Inability: Not on file  . Transportation needs:    Medical: Not on file    Non-medical: Not on file  Tobacco Use  . Smoking status: Former Smoker    Packs/day: 1.00    Types: Cigarettes  . Smokeless tobacco: Never Used  Substance and Sexual Activity  . Alcohol use: No  . Drug use: Not Currently  . Sexual activity: Not Currently  Lifestyle  . Physical activity:    Days per week: Not on file    Minutes per session: Not on file  . Stress: Not on file  Relationships  . Social connections:    Talks on phone: Not on file    Gets together: Not on file    Attends religious service: Not on file    Active member of club or organization: Not on file    Attends meetings of clubs or organizations: Not on file    Relationship status: Not on file  Other Topics Concern  . Not on file  Social History Narrative  . Not on file   Additional Social History:                         Sleep: Good  Appetite:  Good  Current Medications: Current Facility-Administered Medications  Medication Dose Route Frequency Provider Last Rate Last Dose  . acetaminophen (TYLENOL)  tablet 650 mg  650 mg Oral Q6H PRN Clapacs, John T, MD      . alum & mag hydroxide-simeth (MAALOX/MYLANTA) 200-200-20 MG/5ML suspension 30 mL  30 mL Oral Q4H PRN Clapacs, John T, MD      . benztropine (COGENTIN) tablet 1 mg  1 mg Oral BID PRN Amun Stemm, Ileene Hutchinson, MD   1 mg at 04/08/18 2048   Or  . benztropine mesylate (COGENTIN) injection 1 mg  1 mg Intramuscular BID PRN Jodeen Mclin, Ileene Hutchinson, MD      . benztropine (COGENTIN) tablet 1 mg  1 mg Oral QHS Parthenia Tellefsen, Ileene Hutchinson, MD   1 mg at 05/06/18 2110  . divalproex (DEPAKOTE) DR tablet 1,000 mg  1,000 mg Oral BH-q7a Kien Mirsky, Ileene Hutchinson, MD   1,000 mg at 05/07/18 5409   And  . divalproex (DEPAKOTE) DR tablet 750 mg  750 mg Oral QHS Conner Neiss R, MD   750 mg at 05/06/18 2111  . haloperidol (HALDOL) tablet 5 mg  5 mg Oral Q6H PRN  Kailin Leu, Ileene Hutchinson, MD       Or  . haloperidol lactate (HALDOL) injection 5 mg  5 mg Intramuscular Q6H PRN Miroslava Santellan, Ileene Hutchinson, MD   5 mg at 04/01/18 0819  . haloperidol (HALDOL) tablet 5 mg  5 mg Oral QHS Kyrene Longan R, MD   5 mg at 05/06/18 2111  . hydrOXYzine (ATARAX/VISTARIL) tablet 50 mg  50 mg Oral TID PRN Audery Amel, MD   50 mg at 04/23/18 2125  . LORazepam (ATIVAN) tablet 1 mg  1 mg Oral Q6H PRN Haskell Riling, MD   1 mg at 04/16/18 2134   Or  . LORazepam (ATIVAN) injection 1 mg  1 mg Intramuscular Q6H PRN Haskell Riling, MD   1 mg at 04/01/18 0820  . magnesium hydroxide (MILK OF MAGNESIA) suspension 30 mL  30 mL Oral Daily PRN Clapacs, John T, MD      . traZODone (DESYREL) tablet 100 mg  100 mg Oral QHS PRN Clapacs, Jackquline Denmark, MD   100 mg at 05/06/18 2111    Lab Results: No results found for this or any previous visit (from the past 48 hour(s)).  Blood Alcohol level:  Lab Results  Component Value Date   ETH <10 03/25/2018   ETH <10 12/06/2017    Metabolic Disorder Labs: Lab Results  Component Value Date   HGBA1C 5.4 03/25/2018   MPG 108.28 03/25/2018   No results found for: PROLACTIN Lab Results  Component Value Date   CHOL 184 03/25/2018   TRIG 43 03/25/2018   HDL 50 03/25/2018   CHOLHDL 3.7 03/25/2018   VLDL 9 03/25/2018   LDLCALC 125 (H) 03/25/2018    Physical Findings: AIMS: Facial and Oral Movements Muscles of Facial Expression: None, normal Lips and Perioral Area: None, normal Jaw: None, normal Tongue: None, normal,Extremity Movements Upper (arms, wrists, hands, fingers): None, normal Lower (legs, knees, ankles, toes): None, normal, Trunk Movements Neck, shoulders, hips: None, normal, Overall Severity Severity of abnormal movements (highest score from questions above): None, normal Incapacitation due to abnormal movements: None, normal Patient's awareness of abnormal movements (rate only patient's report): No Awareness, Dental Status Current problems with  teeth and/or dentures?: No Does patient usually wear dentures?: No  CIWA:  CIWA-Ar Total: 0 COWS:  COWS Total Score: 0  Musculoskeletal: Strength & Muscle Tone: within normal limits Gait & Station: normal Patient leans: N/A  Psychiatric Specialty Exam: Physical Exam  Nursing note and vitals reviewed.   Review of Systems  All other systems reviewed and are negative.   Blood pressure (!) 95/57, pulse 94, temperature (!) 97.5 F (36.4 C), temperature source Oral, resp. rate 16, height 5\' 8"  (1.727 m), weight 58.1 kg, SpO2 90 %.Body mass index is 19.46 kg/m.  General Appearance: Disheveled  Eye Contact:  Fair  Speech:  Clear and Coherent  Volume:  Decreased  Mood:  Euthymic  Affect:  Appropriate  Thought Process:  Coherent  Orientation:  Full (Time, Place, and Person)  Thought Content:  Logical  Suicidal Thoughts:  No  Homicidal Thoughts:  No  Memory:  NA  Judgement:  Impaired  Insight:  Lacking  Psychomotor Activity:  Normal  Concentration:  Concentration: Fair  Recall:  [employer] of Knowledge:  Fair  Language:  Fair  Akathisia:  No      Assets:  Resilience  ADL's:  Intact  Cognition:  WNL  Sleep:  Number of Hours: 7.25     Treatment Plan Summary: 37 yo male admitted due to psychosis. Psychosis is well controlled. He is waiting placement.   Plan:  Next invega sustenna injection due on 10/16 -Continue Depakote 1000 mg qam and 750 mg qhs -Continue Haldol 5 mg qhs  Dispo -Awaiting placement into new group home  Haskell Riling, MD 05/07/2018, 2:02 PM

## 2018-05-08 NOTE — Plan of Care (Signed)
Patient is alert and oriented X 4, denies SI, HI and AVH. Patient slept in most of the morning and walked the unit pacing; smiling at staff; pleasant ; no problems to report. No self injurious behaviors noted. Problem: Education: Goal: Knowledge of [facility] Education information/materials will improve Outcome: Progressing Goal: Emotional status will improve Outcome: Progressing Goal: Mental status will improve Outcome: Progressing Goal: Verbalization of understanding the information provided will improve Outcome: Progressing   Problem: Activity: Goal: Interest or engagement in activities will improve Outcome: Progressing Goal: Sleeping patterns will improve Outcome: Progressing   Problem: Coping: Goal: Ability to verbalize frustrations and anger appropriately will improve Outcome: Progressing Goal: Ability to demonstrate self-control will improve Outcome: Progressing   Problem: Physical Regulation: Goal: Ability to maintain clinical measurements within normal limits will improve Outcome: Progressing   Problem: Safety: Goal: Periods of time without injury will increase Outcome: Progressing

## 2018-05-08 NOTE — Progress Notes (Signed)
Patient was visible in the milieu, in and out of the room. Calm and cooperative.  Denying thoughts of self harm. Denying hallucinations. Presented to the medication room, reporting that he is feeling improvement. Received medications and was encouraged to call for help as needed. Currently in bed sleeping. No sign of distress. Monitored for safety and security.

## 2018-05-08 NOTE — Plan of Care (Signed)
Calm and cooperative. Compliant with treatment.  

## 2018-05-08 NOTE — Progress Notes (Signed)
[facility] MD Progress Note  05/08/2018 12:44 PM David Davila.  MRN:  [id number] Subjective:  Pt states that he is doing fine. He smiles today and affect is brighter. He has been very patient on the unit waiting for his new group home. He has been calm and cooperative with no outbursts whatsoever. He states that he wants to buy new shoes and a winter coat when he gets out. Denies AH, VH, SI, HI. He is fully oriented.   Principal Problem: Undifferentiated schizophrenia (HCC) Diagnosis:   Patient Active Problem List   Diagnosis Date Noted  . Undifferentiated schizophrenia (HCC) [F20.3] 03/26/2018    Priority: High  . TBI (traumatic brain injury) (HCC) [S06.9X9A] 07/10/2016  . Vitamin B12 deficiency [E53.8] 07/10/2016  . Noncompliance with medication regimen [Z91.14] 06/25/2016  . Cocaine use disorder, mild, abuse (HCC) [F14.10] 06/25/2016  . Cannabis use disorder, mild, abuse [F12.10] 06/25/2016  . 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 Acute psychosis (HCC)   . Antisocial personality disorder (HCC)   . Cannabis abuse    Past  . Paranoid schizophrenia (HCC)   . Schizoaffective disorder, bipolar type (HCC)    History reviewed. No pertinent surgical history. Family History: History reviewed. No pertinent family history. Family Psychiatric  History: See H&P Social History:  Social History   Substance and Sexual Activity  Alcohol Use No     Social History   Substance and Sexual Activity  Drug Use Not Currently    Social History   Socioeconomic History  . Marital status: Single    Spouse name: Not on file  . Number of children: Not on file  . Years of education: Not on file  . Highest education level: Not on file  Occupational History  . Not on file  Social Needs  . Financial resource strain: Not on file  .  Food insecurity:    Worry: Not on file    Inability: Not on file  . Transportation needs:    Medical: Not on file    Non-medical: Not on file  Tobacco Use  . Smoking status: Former Smoker    Packs/day: 1.00    Types: Cigarettes  . Smokeless tobacco: Never Used  Substance and Sexual Activity  . Alcohol use: No  . Drug use: Not Currently  . Sexual activity: Not Currently  Lifestyle  . Physical activity:    Days per week: Not on file    Minutes per session: Not on file  . Stress: Not on file  Relationships  . Social connections:    Talks on phone: Not on file    Gets together: Not on file    Attends religious service: Not on file    Active member of club or organization: Not on file    Attends meetings of clubs or organizations: Not on file    Relationship status: Not on file  Other Topics Concern  . Not on file  Social History Narrative  . Not on file   Additional Social History:                         Sleep: Fair  Appetite:  Good  Current Medications: Current Facility-Administered Medications  Medication Dose Route Frequency Provider Last Rate Last Dose  . acetaminophen (TYLENOL) tablet 650 mg  650 mg  Oral Q6H PRN Clapacs, Jackquline Denmark, MD      . alum & mag hydroxide-simeth (MAALOX/MYLANTA) 200-200-20 MG/5ML suspension 30 mL  30 mL Oral Q4H PRN Clapacs, John T, MD      . benztropine (COGENTIN) tablet 1 mg  1 mg Oral BID PRN Lari Linson, Ileene Hutchinson, MD   1 mg at 04/08/18 2048   Or  . benztropine mesylate (COGENTIN) injection 1 mg  1 mg Intramuscular BID PRN Glenys Snader, Ileene Hutchinson, MD      . benztropine (COGENTIN) tablet 1 mg  1 mg Oral QHS Keefer Soulliere, Ileene Hutchinson, MD   1 mg at 05/07/18 2139  . divalproex (DEPAKOTE) DR tablet 1,000 mg  1,000 mg Oral BH-q7a Neng Albee, Ileene Hutchinson, MD   1,000 mg at 05/07/18 1610   And  . divalproex (DEPAKOTE) DR tablet 750 mg  750 mg Oral QHS Kirby Argueta R, MD   750 mg at 05/07/18 2139  . haloperidol (HALDOL) tablet 5 mg  5 mg Oral Q6H PRN Davi Kroon, Ileene Hutchinson, MD        Or  . haloperidol lactate (HALDOL) injection 5 mg  5 mg Intramuscular Q6H PRN Cyanne Delmar, Ileene Hutchinson, MD   5 mg at 04/01/18 0819  . haloperidol (HALDOL) tablet 5 mg  5 mg Oral QHS Joquan Lotz, Ileene Hutchinson, MD   5 mg at 05/07/18 2138  . hydrOXYzine (ATARAX/VISTARIL) tablet 50 mg  50 mg Oral TID PRN Clapacs, Jackquline Denmark, MD   50 mg at 04/23/18 2125  . LORazepam (ATIVAN) tablet 1 mg  1 mg Oral Q6H PRN Haskell Riling, MD   1 mg at 04/16/18 2134   Or  . LORazepam (ATIVAN) injection 1 mg  1 mg Intramuscular Q6H PRN Haskell Riling, MD   1 mg at 04/01/18 0820  . magnesium hydroxide (MILK OF MAGNESIA) suspension 30 mL  30 mL Oral Daily PRN Clapacs, John T, MD      . traZODone (DESYREL) tablet 100 mg  100 mg Oral QHS PRN Clapacs, Jackquline Denmark, MD   100 mg at 05/06/18 2111    Lab Results: No results found for this or any previous visit (from the past 48 hour(s)).  Blood Alcohol level:  Lab Results  Component Value Date   ETH <10 03/25/2018   ETH <10 12/06/2017    Metabolic Disorder Labs: Lab Results  Component Value Date   HGBA1C 5.4 03/25/2018   MPG 108.28 03/25/2018   No results found for: PROLACTIN Lab Results  Component Value Date   CHOL 184 03/25/2018   TRIG 43 03/25/2018   HDL 50 03/25/2018   CHOLHDL 3.7 03/25/2018   VLDL 9 03/25/2018   LDLCALC 125 (H) 03/25/2018    Physical Findings: AIMS: Facial and Oral Movements Muscles of Facial Expression: None, normal Lips and Perioral Area: None, normal Jaw: None, normal Tongue: None, normal,Extremity Movements Upper (arms, wrists, hands, fingers): None, normal Lower (legs, knees, ankles, toes): None, normal, Trunk Movements Neck, shoulders, hips: None, normal, Overall Severity Severity of abnormal movements (highest score from questions above): None, normal Incapacitation due to abnormal movements: None, normal Patient's awareness of abnormal movements (rate only patient's report): No Awareness, Dental Status Current problems with teeth and/or dentures?:  No Does patient usually wear dentures?: No  CIWA:  CIWA-Ar Total: 0 COWS:  COWS Total Score: 0  Musculoskeletal: Strength & Muscle Tone: within normal limits Gait & Station: normal Patient leans: N/A  Psychiatric Specialty Exam: Physical Exam  Nursing note and vitals reviewed.  Review of Systems  All other systems reviewed and are negative.   Blood pressure (!) 86/46, pulse (!) 54, temperature 98.1 F (36.7 C), temperature source Oral, resp. rate 16, height 5\' 8"  (1.727 m), weight 58.1 kg, SpO2 97 %.Body mass index is 19.46 kg/m.  General Appearance: Casual  Eye Contact:  Fair  Speech:  Clear and Coherent  Volume:  Normal  Mood:  Euthymic  Affect:  Appropriate  Thought Process:  Coherent and Goal Directed  Orientation:  Full (Time, Place, and Person)  Thought Content:  Logical  Suicidal Thoughts:  No  Homicidal Thoughts:  No  Memory:  Immediate;   Fair  Judgement:  Impaired  Insight:  Lacking  Psychomotor Activity:  Normal  Concentration:  Concentration: Fair  Recall:  [employer] of Knowledge:  Fair  Language:  Fair  Akathisia:  No      Assets:  Resilience  ADL's:  Intact  Cognition:  WNL  Sleep:  Number of Hours: 5.15     Treatment Plan Summary: 37 yo male admitted due to psychosis. Waiting placement  Plan:  Next Invega Injection due on 10/16 -Continue Depakote 1000 mg qam and 750 mg qhs -Continue Haldol 5 mg qhs  Dispo -Waiting placement Haskell Riling, MD 05/08/2018, 12:44 PM

## 2018-05-08 NOTE — Plan of Care (Addendum)
Patient found awake in bed upon my arrival. Patient is neither visible nor social this evening. Patient is isolative to his room most of the evening. Mood is "okay" and affect is flat. Denies SI/HI/AVH. Reports eating and voiding adequately. Did not eat snack. Compliant with HS medications and staff direction. Q 15 minute checks maintained. Will continue to monitor throughout the shift. Patient slept 4 hours. No apparent distress. Compliant with am Depakote. Will endorse care to oncoming shift.  Problem: Education: Goal: Emotional status will improve Outcome: Progressing Goal: Mental status will improve Outcome: Progressing   Problem: Activity: Goal: Sleeping patterns will improve Outcome: Progressing   Problem: Coping: Goal: Ability to verbalize frustrations and anger appropriately will improve Outcome: Progressing Goal: Ability to demonstrate self-control will improve Outcome: Progressing   Problem: Education: Goal: Knowledge of the prescribed therapeutic regimen will improve Outcome: Progressing   Problem: Activity: Goal: Interest or engagement in activities will improve Outcome: Not Progressing

## 2018-05-08 NOTE — Progress Notes (Signed)
Recreation Therapy Notes  Date: 05/08/2018  Time: 9:30 am   Location: Craft room   Behavioral response: N/A   Intervention Topic: Stress  Discussion/Intervention: Patient did not attend group.   Clinical Observations/Feedback:  Patient did not attend group.   Ceri Mayer LRT/CTRS         Lyne Khurana 05/08/2018 10:28 AM 

## 2018-05-08 NOTE — Tx Team (Signed)
Interdisciplinary Treatment and Diagnostic Plan Update  05/08/2018 Time of Session: 8:30am David Davila. MRN: [id number]  Principal Diagnosis: Undifferentiated schizophrenia (HCC)  Secondary Diagnoses: Principal Problem:   Undifferentiated schizophrenia (HCC)   Current Medications:  Current Facility-Administered Medications  Medication Dose Route Frequency Provider Last Rate Last Dose  . acetaminophen (TYLENOL) tablet 650 mg  650 mg Oral Q6H PRN Clapacs, John T, MD      . alum & mag hydroxide-simeth (MAALOX/MYLANTA) 200-200-20 MG/5ML suspension 30 mL  30 mL Oral Q4H PRN Clapacs, John T, MD      . benztropine (COGENTIN) tablet 1 mg  1 mg Oral BID PRN McNew, Ileene Hutchinson, MD   1 mg at 04/08/18 2048   Or  . benztropine mesylate (COGENTIN) injection 1 mg  1 mg Intramuscular BID PRN McNew, Ileene Hutchinson, MD      . benztropine (COGENTIN) tablet 1 mg  1 mg Oral QHS McNew, Ileene Hutchinson, MD   1 mg at 05/07/18 2139  . divalproex (DEPAKOTE) DR tablet 1,000 mg  1,000 mg Oral BH-q7a McNew, Ileene Hutchinson, MD   1,000 mg at 05/07/18 1324   And  . divalproex (DEPAKOTE) DR tablet 750 mg  750 mg Oral QHS McNew, Holly R, MD   750 mg at 05/07/18 2139  . haloperidol (HALDOL) tablet 5 mg  5 mg Oral Q6H PRN McNew, Ileene Hutchinson, MD       Or  . haloperidol lactate (HALDOL) injection 5 mg  5 mg Intramuscular Q6H PRN McNew, Ileene Hutchinson, MD   5 mg at 04/01/18 0819  . haloperidol (HALDOL) tablet 5 mg  5 mg Oral QHS McNew, Ileene Hutchinson, MD   5 mg at 05/07/18 2138  . hydrOXYzine (ATARAX/VISTARIL) tablet 50 mg  50 mg Oral TID PRN Clapacs, Jackquline Denmark, MD   50 mg at 04/23/18 2125  . LORazepam (ATIVAN) tablet 1 mg  1 mg Oral Q6H PRN Haskell Riling, MD   1 mg at 04/16/18 2134   Or  . LORazepam (ATIVAN) injection 1 mg  1 mg Intramuscular Q6H PRN Haskell Riling, MD   1 mg at 04/01/18 0820  . magnesium hydroxide (MILK OF MAGNESIA) suspension 30 mL  30 mL Oral Daily PRN Clapacs, John T, MD      . traZODone (DESYREL) tablet 100 mg  100 mg Oral QHS PRN  Clapacs, Jackquline Denmark, MD   100 mg at 05/06/18 2111   PTA Medications: Medications Prior to Admission  Medication Sig Dispense Refill Last Dose  . atorvastatin (LIPITOR) 10 MG tablet Take 1 tablet (10 mg total) by mouth daily at 6 PM. (Patient not taking: Reported on 11/16/2017) 30 tablet 0 Not Taking at Unknown time  . divalproex (DEPAKOTE ER) 500 MG 24 hr tablet Take 1,000 mg by mouth daily.   unknown at unknown  . hydrOXYzine (ATARAX/VISTARIL) 25 MG tablet Take 1 tablet (25 mg total) by mouth 3 (three) times daily. (Patient not taking: Reported on 11/16/2017) 30 tablet 0 Not Taking at Unknown time  . lamoTRIgine (LAMICTAL) 100 MG tablet Take 1 tablet (100 mg total) by mouth every evening. (Patient not taking: Reported on 11/16/2017) 30 tablet 0 Not Taking at Unknown time  . lamoTRIgine (LAMICTAL) 25 MG tablet Take 2 tablets (50 mg total) by mouth daily. (Patient not taking: Reported on 11/16/2017) 60 tablet 0 Not Taking at Unknown time  . loratadine (CLARITIN) 10 MG tablet Take 10 mg by mouth daily.   unknown at unknown  .  LORazepam (ATIVAN) 0.5 MG tablet Take 1 tablet (0.5 mg total) by mouth 2 (two) times daily. (Patient not taking: Reported on 11/16/2017) 60 tablet 0 Not Taking at Unknown time  . OLANZapine zydis (ZYPREXA) 5 MG disintegrating tablet Take 1 tablet (5 mg total) by mouth at bedtime. (Patient not taking: Reported on 11/16/2017) 30 tablet 0 Not Taking at Unknown time  . paliperidone (INVEGA SUSTENNA) 156 MG/ML SUSP injection Inject 1 mL (156 mg total) into the muscle every 28 (twenty-eight) days. NEXT DOSE DUE August 13, 2016 (Patient taking differently: Inject 156 mg into the muscle every 28 (twenty-eight) days. ) 0.9 mL 0   . traZODone (DESYREL) 100 MG tablet Take 1 tablet (100 mg total) by mouth at bedtime. (Patient taking differently: Take 50 mg by mouth at bedtime. ) 30 tablet 0 Not Taking at Unknown time    Patient Stressors: Medication change or noncompliance Substance abuse  Patient  Strengths: General fund of knowledge  Treatment Modalities: Medication Management, Group therapy, Case management,  1 to 1 session with clinician, Psychoeducation, Recreational therapy.   Physician Treatment Plan for Primary Diagnosis: Undifferentiated schizophrenia (HCC) Long Term Goal(s): Improvement in symptoms so as ready for discharge   Short Term Goals: Ability to identify changes in lifestyle to reduce recurrence of condition will improve  Medication Management: Evaluate patient's response, side effects, and tolerance of medication regimen.  Therapeutic Interventions: 1 to 1 sessions, Unit Group sessions and Medication administration.  Evaluation of Outcomes: Progressing   9/9:Schizoaffective disorder -Next [place] injection due on 04/30/18 -Continue Depakote 1000 mg qam and 750 mg qhs -Continue Haldol 5 mg dailyPt is in good mood today. He is smiling and less irritable. He is hoping to get an interview with a group home so he can get out of the hospital. He states taht he is feeling good. He states, "I'm not talking to myself anymore."    Physician Treatment Plan for Secondary Diagnosis: Principal Problem:   Undifferentiated schizophrenia (HCC)  Long Term Goal(s): Improvement in symptoms so as ready for discharge   Short Term Goals: Ability to identify changes in lifestyle to reduce recurrence of condition will improve     Medication Management: Evaluate patient's response, side effects, and tolerance of medication regimen.  Therapeutic Interventions: 1 to 1 sessions, Unit Group sessions and Medication administration.  Evaluation of Outcomes: Progressing   RN Treatment Plan for Primary Diagnosis: Undifferentiated schizophrenia (HCC) Long Term Goal(s): Knowledge of disease and therapeutic regimen to maintain health will improve  Short Term Goals: Ability to verbalize feelings will improve, Ability to identify and develop effective coping behaviors will improve  and Compliance with prescribed medications will improve  Medication Management: RN will administer medications as ordered by provider, will assess and evaluate patient's response and provide education to patient for prescribed medication. RN will report any adverse and/or side effects to prescribing provider.  Therapeutic Interventions: 1 on 1 counseling sessions, Psychoeducation, Medication administration, Evaluate responses to treatment, Monitor vital signs and CBGs as ordered, Perform/monitor CIWA, COWS, AIMS and Fall Risk screenings as ordered, Perform wound care treatments as ordered.  Evaluation of Outcomes: Progressing   LCSW Treatment Plan for Primary Diagnosis: Undifferentiated schizophrenia (HCC) Long Term Goal(s): Safe transition to appropriate next level of care at discharge, Engage patient in therapeutic group addressing interpersonal concerns.  Short Term Goals: Engage patient in aftercare planning with referrals and resources, Increase social support, Increase emotional regulation, Identify triggers associated with mental health/substance abuse issues and Increase skills for  wellness and recovery  Therapeutic Interventions: Assess for all discharge needs, 1 to 1 time with Social worker, Explore available resources and support systems, Assess for adequacy in community support network, Educate family and significant other(s) on suicide prevention, Complete Psychosocial Assessment, Interpersonal group therapy.  Evaluation of Outcomes: Progressing   Progress in Treatment: Attending groups: No. Participating in groups: No. Taking medication as prescribed: Yes. Toleration medication: Yes. Family/Significant other contact made: Yes, individual(s) contacted:  Patients Guardian Patient understands diagnosis: Yes. Discussing patient identified problems/goals with staff: Yes. Medical problems stabilized or resolved: Yes. Denies suicidal/homicidal ideation: Yes. Issues/concerns per  patient self-inventory: No. Other:   New problem(s) identified: No, Describe:  None  New Short Term/Long Term Goal(s): "Take care of my family."  Patient Goals:   "Take care of my family."  Discharge Plan or Barriers: To discharge to a new group home and engage with  ACT Team.  Reason for Continuation of Hospitalization: Medication stabilization  Estimated Length of Stay: 4 days Recreational Therapy: Patient Stressors: N/A Patient Goal: Patient will engage in interactions with peers and staff in pro-social manner at least 2x within 5 recreation therapy group sessions   Attendees: Patient:  05/08/2018 9:13 AM  Physician: Corinna Gab, MD 05/08/2018 9:13 AM  Nursing: 05/08/2018 9:13 AM  RN Care Manager: 05/08/2018 9:13 AM  Social Worker: Johny Shears, LCSWA 05/08/2018 9:13 AM  Recreational Therapist:  05/08/2018 9:13 AM  Other: Huey Romans, LCSW 05/08/2018 9:13 AM  Other:Dr. Jennet Maduro, MD 05/08/2018 9:13 AM  Other:Elon Marthenia Rolling- RHA 05/08/2018 9:13 AM    Scribe for Treatment Team: Johny Shears, LCSW 05/08/2018 9:13 AM

## 2018-05-08 NOTE — BHH Group Notes (Signed)

## 2018-05-09 NOTE — [facility] Group Notes (Signed)
LCSW Group Therapy Note  05/09/2018 1:15pm  Type of Therapy and Topic:  Group Therapy:  Cognitive Distortions  Participation Level:  Did Not Attend   Description of Group:    Patients in this group will be introduced to the topic of cognitive distortions.  Patients will identify and describe cognitive distortions, describe the feelings these distortions create for them.  Patients will identify one or more situations in their personal life where they have cognitively distorted thinking and will verbalize challenging this cognitive distortion through positive thinking skills.  Patients will practice the skill of using positive affirmations to challenge cognitive distortions using affirmation cards.    Therapeutic Goals:  1. Patient will identify two or more cognitive distortions they have used 2. Patient will identify one or more emotions that stem from use of a cognitive distortion 3. Patient will demonstrate use of a positive affirmation to counter a cognitive distortion through discussion and/or role play. 4. Patient will describe one way cognitive distortions can be detrimental to wellness   Summary of Patient Progress: Pt was invited to attend group but chose not to attend. CSW will continue to encourage pt to attend group throughout their admission.      Therapeutic Modalities:   Cognitive Behavioral Therapy Motivational Interviewing   Heyden Jaber  CUEBAS-COLON, LCSW 05/09/2018 12:41 PM

## 2018-05-09 NOTE — Progress Notes (Signed)
Patient ID: David Davila., male   DOB: 1980/11/11, 37 y.o.   MRN: [id number] Per State regulations 482.30 this chart was reviewed for medical necessity with respect to the patient's admission/duration of stay.    Next review date: 05/13/18  Thurman Coyer, BSN, RN-BC  Case Manager

## 2018-05-09 NOTE — Plan of Care (Signed)
Patient is alert and oriented x 4 denies SI; HI and AVH. Patient cooperative and pleasant; continues to isolates to room most of the day. Patient denies pain 0/10. There are no self injurious behaviors noted. 15 minute checks to continue. Problem: Education: Goal: Knowledge of [facility] Education information/materials will improve Outcome: Progressing Goal: Emotional status will improve Outcome: Progressing Goal: Mental status will improve Outcome: Progressing Goal: Verbalization of understanding the information provided will improve Outcome: Progressing   Problem: Activity: Goal: Interest or engagement in activities will improve Outcome: Not Progressing Goal: Sleeping patterns will improve Outcome: Progressing   Problem: Coping: Goal: Ability to verbalize frustrations and anger appropriately will improve Outcome: Progressing Goal: Ability to demonstrate self-control will improve Outcome: Progressing

## 2018-05-09 NOTE — Progress Notes (Signed)
[facility] MD Progress Note  05/09/2018 5:06 PM David Davila.  MRN:  [id number] Subjective:   I'm fine" Pt denies AVH,  he has blunted affect, denies SI/HI, pt cooperative, med compliant, denies side effects.   Principal Problem: Undifferentiated schizophrenia (HCC) Diagnosis:   Patient Active Problem List   Diagnosis Date Noted  . Undifferentiated schizophrenia (HCC) [F20.3] 03/26/2018  . TBI (traumatic brain injury) (HCC) [S06.9X9A] 07/10/2016  . Vitamin B12 deficiency [E53.8] 07/10/2016  . Noncompliance with medication regimen [Z91.14] 06/25/2016  . Cocaine use disorder, mild, abuse (HCC) [F14.10] 06/25/2016  . Cannabis use disorder, mild, abuse [F12.10] 06/25/2016  . Paranoid schizophrenia (HCC) [F20.0] 06/24/2016   Total Time spent with patient: 25 min  Past Psychiatric History: See H&P  Past Medical History:  Past Medical History:  Diagnosis Date  . Acute psychosis (HCC)   . Antisocial personality disorder (HCC)   . Cannabis abuse    Past  . Paranoid schizophrenia (HCC)   . Schizoaffective disorder, bipolar type (HCC)    History reviewed. No pertinent surgical history. Family History: History reviewed. No pertinent family history. Family Psychiatric  History: See H&P Social History:  Social History   Substance and Sexual Activity  Alcohol Use No     Social History   Substance and Sexual Activity  Drug Use Not Currently    Social History   Socioeconomic History  . Marital status: Single    Spouse name: Not on file  . Number of children: Not on file  . Years of education: Not on file  . Highest education level: Not on file  Occupational History  . Not on file  Social Needs  . Financial resource strain: Not on file  . Food insecurity:    Worry: Not on file    Inability: Not on file  . Transportation needs:    Medical: Not on file    Non-medical: Not on file  Tobacco Use  . Smoking status: Former Smoker    Packs/day: 1.00    Types: Cigarettes   . Smokeless tobacco: Never Used  Substance and Sexual Activity  . Alcohol use: No  . Drug use: Not Currently  . Sexual activity: Not Currently  Lifestyle  . Physical activity:    Days per week: Not on file    Minutes per session: Not on file  . Stress: Not on file  Relationships  . Social connections:    Talks on phone: Not on file    Gets together: Not on file    Attends religious service: Not on file    Active member of club or organization: Not on file    Attends meetings of clubs or organizations: Not on file    Relationship status: Not on file  Other Topics Concern  . Not on file  Social History Narrative  . Not on file   Additional Social History:                         Sleep: Fair  Appetite:  Good  Current Medications: Current Facility-Administered Medications  Medication Dose Route Frequency Provider Last Rate Last Dose  . acetaminophen (TYLENOL) tablet 650 mg  650 mg Oral Q6H PRN Clapacs, John T, MD      . alum & mag hydroxide-simeth (MAALOX/MYLANTA) 200-200-20 MG/5ML suspension 30 mL  30 mL Oral Q4H PRN Clapacs, John T, MD      . benztropine (COGENTIN) tablet 1 mg  1 mg Oral BID  PRN Haskell Riling, MD   1 mg at 04/08/18 2048   Or  . benztropine mesylate (COGENTIN) injection 1 mg  1 mg Intramuscular BID PRN McNew, Ileene Hutchinson, MD      . benztropine (COGENTIN) tablet 1 mg  1 mg Oral QHS McNew, Ileene Hutchinson, MD   1 mg at 05/08/18 2128  . divalproex (DEPAKOTE) DR tablet 1,000 mg  1,000 mg Oral BH-q7a McNew, Ileene Hutchinson, MD   1,000 mg at 05/09/18 1610   And  . divalproex (DEPAKOTE) DR tablet 750 mg  750 mg Oral QHS McNew, Holly R, MD   750 mg at 05/08/18 2128  . haloperidol (HALDOL) tablet 5 mg  5 mg Oral Q6H PRN McNew, Ileene Hutchinson, MD       Or  . haloperidol lactate (HALDOL) injection 5 mg  5 mg Intramuscular Q6H PRN McNew, Ileene Hutchinson, MD   5 mg at 04/01/18 0819  . haloperidol (HALDOL) tablet 5 mg  5 mg Oral QHS McNew, Holly R, MD   5 mg at 05/08/18 2128  . hydrOXYzine  (ATARAX/VISTARIL) tablet 50 mg  50 mg Oral TID PRN Clapacs, Jackquline Denmark, MD   50 mg at 04/23/18 2125  . LORazepam (ATIVAN) tablet 1 mg  1 mg Oral Q6H PRN Haskell Riling, MD   1 mg at 04/16/18 2134   Or  . LORazepam (ATIVAN) injection 1 mg  1 mg Intramuscular Q6H PRN Haskell Riling, MD   1 mg at 04/01/18 0820  . magnesium hydroxide (MILK OF MAGNESIA) suspension 30 mL  30 mL Oral Daily PRN Clapacs, John T, MD      . traZODone (DESYREL) tablet 100 mg  100 mg Oral QHS PRN Clapacs, Jackquline Denmark, MD   100 mg at 05/08/18 2128    Lab Results: No results found for this or any previous visit (from the past 48 hour(s)).  Blood Alcohol level:  Lab Results  Component Value Date   ETH <10 03/25/2018   ETH <10 12/06/2017    Metabolic Disorder Labs: Lab Results  Component Value Date   HGBA1C 5.4 03/25/2018   MPG 108.28 03/25/2018   No results found for: PROLACTIN Lab Results  Component Value Date   CHOL 184 03/25/2018   TRIG 43 03/25/2018   HDL 50 03/25/2018   CHOLHDL 3.7 03/25/2018   VLDL 9 03/25/2018   LDLCALC 125 (H) 03/25/2018    Physical Findings: AIMS: Facial and Oral Movements Muscles of Facial Expression: None, normal Lips and Perioral Area: None, normal Jaw: None, normal Tongue: None, normal,Extremity Movements Upper (arms, wrists, hands, fingers): None, normal Lower (legs, knees, ankles, toes): None, normal, Trunk Movements Neck, shoulders, hips: None, normal, Overall Severity Severity of abnormal movements (highest score from questions above): None, normal Incapacitation due to abnormal movements: None, normal Patient's awareness of abnormal movements (rate only patient's report): No Awareness, Dental Status Current problems with teeth and/or dentures?: No Does patient usually wear dentures?: No  CIWA:  CIWA-Ar Total: 0 COWS:  COWS Total Score: 0  Musculoskeletal: Strength & Muscle Tone: within normal limits Gait & Station: normal Patient leans: N/A  Psychiatric Specialty  Exam: Physical Exam  Nursing note and vitals reviewed.   Review of Systems  All other systems reviewed and are negative.   Blood pressure (!) 94/53, pulse 69, temperature 98.6 F (37 C), temperature source Oral, resp. rate 20, height 5\' 8"  (1.727 m), weight 58.1 kg, SpO2 98 %.Body mass index is 19.46 kg/m.  General  Appearance: Casual  Eye Contact:  Fair  Speech:  Clear and Coherent  Volume:  Normal  Mood:  Euthymic  Affect:  Appropriate  Thought Process:  Coherent and Goal Directed  Orientation:  Full (Time, Place, and Person)  Thought Content:  Logical, denies AVH  Suicidal Thoughts:  denies  Homicidal Thoughts:  No  Memory:  Immediate;   Fair  Judgement:  Impaired  Insight:  Lacking  Psychomotor Activity:  Normal  Concentration:  Concentration: Fair  Recall:  [employer] of Knowledge:  Fair  Language:  Fair  Akathisia:  No      Assets:  Resilience  ADL's:  Intact  Cognition:  WNL  Sleep:  Number of Hours: 4     Treatment Plan Summary: 37 yo male admitted due to psychosis. Psychosis improving. Waiting for placement  Plan:  Next Invega Injection due on 10/16 -Continue Depakote 1000 mg qam and 750 mg qhs -Continue Haldol 5 mg qhs  Dispo -Waiting placement Beverly Sessions, MD 05/09/2018, 5:06 PMPatient ID: David Davila., male   DOB: 1981-07-16, 37 y.o.   MRN: [id number]

## 2018-05-10 NOTE — Progress Notes (Signed)
D- Patient alert and oriented. Patient presents in a pleasant mood on assessment stating that he slept well last night and had no major complaints to voice to this Clinical research associate. Patient denies SI, HI, AVH, and pain at this time. Patient also denies any depression and anxiety stating that he is feeling fine overall. Patient's goal for today is to "focus on discharge".  A- Support and encouragement provided.  Routine safety checks conducted every 15 minutes. Patient informed to notify staff with problems or concerns.  R- Patient contracts for safety at this time. Patient compliant with medications and treatment plan. Patient receptive, calm, and cooperative. Patient interacts well with others on the unit.  Patient remains safe at this time.

## 2018-05-10 NOTE — [facility] Group Notes (Signed)
LCSW Group Therapy Note 05/10/2018 1:15pm  Type of Therapy and Topic: Group Therapy: Feelings Around Returning Home & Establishing a Supportive Framework and Supporting Oneself When Supports Not Available  Participation Level: Did Not Attend  Description of Group:  Patients first processed thoughts and feelings about upcoming discharge. These included fears of upcoming changes, lack of change, new living environments, judgements and expectations from others and overall stigma of mental health issues. The group then discussed the definition of a supportive framework, what that looks and feels like, and how do to discern it from an unhealthy non-supportive network. The group identified different types of supports as well as what to do when your family/friends are less than helpful or unavailable  Therapeutic Goals  1. Patient will identify one healthy supportive network that they can use at discharge. 2. Patient will identify one factor of a supportive framework and how to tell it from an unhealthy network. 3. Patient able to identify one coping skill to use when they do not have positive supports from others. 4. Patient will demonstrate ability to communicate their needs through discussion and/or role plays.  Summary of Patient Progress:  Pt was invited to attend group but chose not to attend. CSW will continue to encourage pt to attend group throughout their admission.   Therapeutic Modalities Cognitive Behavioral Therapy Motivational Interviewing   David Davila  CUEBAS-COLON, LCSW 05/10/2018 11:54 AM

## 2018-05-10 NOTE — Plan of Care (Signed)
Patient has verbalized understanding of his prescribed therapeutic regimen as well as the general information that's been provided to him and has not voiced any further questions or concerns at this time. Patient denies SI/HI/AVH as well as any signs/symptoms of depression and anxiety stating that he is feeling fine and his goal for today is to focus to discharge. Patient continues to sleep majority of the morning and then comes out during the afternoon. Patient has the ability to verbalize his anger and frustrations into appropriate behaviors and has been demonstrating self-control on the unit. Patient has the ability to identify the available resources that can assist him in meeting his health-care needs. Patient has maintained his clinical measurements within normal limits and has achieved adequate nutrition. Patient participates in self-care when appropriate for him. Patient has been free from injury thus far and remains safe on this unit at this time.  Problem: Education: Goal: Knowledge of [facility] Education information/materials will improve Outcome: Progressing Goal: Emotional status will improve Outcome: Progressing Goal: Mental status will improve Outcome: Progressing Goal: Verbalization of understanding the information provided will improve Outcome: Progressing   Problem: Activity: Goal: Interest or engagement in activities will improve Outcome: Progressing Goal: Sleeping patterns will improve Outcome: Progressing   Problem: Coping: Goal: Ability to verbalize frustrations and anger appropriately will improve Outcome: Progressing Goal: Ability to demonstrate self-control will improve Outcome: Progressing   Problem: Health Behavior/Discharge Planning: Goal: Identification of resources available to assist in meeting health care needs will improve Outcome: Progressing Goal: Compliance with treatment plan for underlying cause of condition will improve Outcome:  Progressing   Problem: Physical Regulation: Goal: Ability to maintain clinical measurements within normal limits will improve Outcome: Progressing   Problem: Safety: Goal: Periods of time without injury will increase Outcome: Progressing   Problem: Activity: Goal: Will verbalize the importance of balancing activity with adequate rest periods Outcome: Progressing   Problem: Education: Goal: Will be free of psychotic symptoms Outcome: Progressing Goal: Knowledge of the prescribed therapeutic regimen will improve Outcome: Progressing   Problem: Coping: Goal: Coping ability will improve Outcome: Progressing Goal: Will verbalize feelings Outcome: Progressing   Problem: Health Behavior/Discharge Planning: Goal: Compliance with prescribed medication regimen will improve Outcome: Progressing   Problem: Nutritional: Goal: Ability to achieve adequate nutritional intake will improve Outcome: Progressing   Problem: Role Relationship: Goal: Ability to communicate needs accurately will improve Outcome: Progressing Goal: Ability to interact with others will improve Outcome: Progressing   Problem: Safety: Goal: Ability to redirect hostility and anger into socially appropriate behaviors will improve Outcome: Progressing Goal: Ability to remain free from injury will improve Outcome: Progressing   Problem: Self-Care: Goal: Ability to participate in self-care as condition permits will improve Outcome: Progressing   Problem: Self-Concept: Goal: Will verbalize positive feelings about self Outcome: Progressing   Problem: Safety: Goal: Ability to remain free from injury will improve Outcome: Progressing

## 2018-05-10 NOTE — Progress Notes (Signed)
[facility] MD Progress Note  05/10/2018 12:37 PM David Davila.  MRN:  [id number] Subjective:    Pt has no complaints, reportedly isolates in his room, denies AVH,   denies SI/HI,  med compliant, denies side effects.   Principal Problem: Undifferentiated schizophrenia (HCC) Diagnosis:   Patient Active Problem List   Diagnosis Date Noted  . Undifferentiated schizophrenia (HCC) [F20.3] 03/26/2018  . TBI (traumatic brain injury) (HCC) [S06.9X9A] 07/10/2016  . Vitamin B12 deficiency [E53.8] 07/10/2016  . Noncompliance with medication regimen [Z91.14] 06/25/2016  . Cocaine use disorder, mild, abuse (HCC) [F14.10] 06/25/2016  . Cannabis use disorder, mild, abuse [F12.10] 06/25/2016  . Paranoid schizophrenia (HCC) [F20.0] 06/24/2016   Total Time spent with patient: 25 min  Past Psychiatric History: See H&P  Past Medical History:  Past Medical History:  Diagnosis Date  . Acute psychosis (HCC)   . Antisocial personality disorder (HCC)   . Cannabis abuse    Past  . Paranoid schizophrenia (HCC)   . Schizoaffective disorder, bipolar type (HCC)    History reviewed. No pertinent surgical history. Family History: History reviewed. No pertinent family history. Family Psychiatric  History: See H&P Social History:  Social History   Substance and Sexual Activity  Alcohol Use No     Social History   Substance and Sexual Activity  Drug Use Not Currently    Social History   Socioeconomic History  . Marital status: Single    Spouse name: Not on file  . Number of children: Not on file  . Years of education: Not on file  . Highest education level: Not on file  Occupational History  . Not on file  Social Needs  . Financial resource strain: Not on file  . Food insecurity:    Worry: Not on file    Inability: Not on file  . Transportation needs:    Medical: Not on file    Non-medical: Not on file  Tobacco Use  . Smoking status: Former Smoker    Packs/day: 1.00    Types:  Cigarettes  . Smokeless tobacco: Never Used  Substance and Sexual Activity  . Alcohol use: No  . Drug use: Not Currently  . Sexual activity: Not Currently  Lifestyle  . Physical activity:    Days per week: Not on file    Minutes per session: Not on file  . Stress: Not on file  Relationships  . Social connections:    Talks on phone: Not on file    Gets together: Not on file    Attends religious service: Not on file    Active member of club or organization: Not on file    Attends meetings of clubs or organizations: Not on file    Relationship status: Not on file  Other Topics Concern  . Not on file  Social History Narrative  . Not on file   Additional Social History:                         Sleep: Fair  Appetite:  Good  Current Medications: Current Facility-Administered Medications  Medication Dose Route Frequency Provider Last Rate Last Dose  . acetaminophen (TYLENOL) tablet 650 mg  650 mg Oral Q6H PRN Clapacs, John T, MD      . alum & mag hydroxide-simeth (MAALOX/MYLANTA) 200-200-20 MG/5ML suspension 30 mL  30 mL Oral Q4H PRN Clapacs, John T, MD      . benztropine (COGENTIN) tablet 1 mg  1  mg Oral BID PRN Haskell Riling, MD   1 mg at 04/08/18 2048   Or  . benztropine mesylate (COGENTIN) injection 1 mg  1 mg Intramuscular BID PRN McNew, Ileene Hutchinson, MD      . benztropine (COGENTIN) tablet 1 mg  1 mg Oral QHS McNew, Ileene Hutchinson, MD   1 mg at 05/09/18 2120  . divalproex (DEPAKOTE) DR tablet 1,000 mg  1,000 mg Oral BH-q7a McNew, Ileene Hutchinson, MD   1,000 mg at 05/10/18 0654   And  . divalproex (DEPAKOTE) DR tablet 750 mg  750 mg Oral QHS McNew, Holly R, MD   750 mg at 05/09/18 2119  . haloperidol (HALDOL) tablet 5 mg  5 mg Oral Q6H PRN McNew, Ileene Hutchinson, MD       Or  . haloperidol lactate (HALDOL) injection 5 mg  5 mg Intramuscular Q6H PRN McNew, Ileene Hutchinson, MD   5 mg at 04/01/18 0819  . haloperidol (HALDOL) tablet 5 mg  5 mg Oral QHS McNew, Ileene Hutchinson, MD   5 mg at 05/09/18 2120  .  hydrOXYzine (ATARAX/VISTARIL) tablet 50 mg  50 mg Oral TID PRN Audery Amel, MD   50 mg at 05/09/18 2119  . LORazepam (ATIVAN) tablet 1 mg  1 mg Oral Q6H PRN Haskell Riling, MD   1 mg at 04/16/18 2134   Or  . LORazepam (ATIVAN) injection 1 mg  1 mg Intramuscular Q6H PRN Haskell Riling, MD   1 mg at 04/01/18 0820  . magnesium hydroxide (MILK OF MAGNESIA) suspension 30 mL  30 mL Oral Daily PRN Clapacs, John T, MD      . traZODone (DESYREL) tablet 100 mg  100 mg Oral QHS PRN Clapacs, Jackquline Denmark, MD   100 mg at 05/09/18 2119    Lab Results: No results found for this or any previous visit (from the past 48 hour(s)).  Blood Alcohol level:  Lab Results  Component Value Date   ETH <10 03/25/2018   ETH <10 12/06/2017    Metabolic Disorder Labs: Lab Results  Component Value Date   HGBA1C 5.4 03/25/2018   MPG 108.28 03/25/2018   No results found for: PROLACTIN Lab Results  Component Value Date   CHOL 184 03/25/2018   TRIG 43 03/25/2018   HDL 50 03/25/2018   CHOLHDL 3.7 03/25/2018   VLDL 9 03/25/2018   LDLCALC 125 (H) 03/25/2018    Physical Findings: AIMS: Facial and Oral Movements Muscles of Facial Expression: None, normal Lips and Perioral Area: None, normal Jaw: None, normal Tongue: None, normal,Extremity Movements Upper (arms, wrists, hands, fingers): None, normal Lower (legs, knees, ankles, toes): None, normal, Trunk Movements Neck, shoulders, hips: None, normal, Overall Severity Severity of abnormal movements (highest score from questions above): None, normal Incapacitation due to abnormal movements: None, normal Patient's awareness of abnormal movements (rate only patient's report): No Awareness, Dental Status Current problems with teeth and/or dentures?: No Does patient usually wear dentures?: No  CIWA:  CIWA-Ar Total: 0 COWS:  COWS Total Score: 0  Musculoskeletal: Strength & Muscle Tone: within normal limits Gait & Station: normal Patient leans: N/A  Psychiatric  Specialty Exam: Physical Exam  Nursing note and vitals reviewed.   Review of Systems  All other systems reviewed and are negative.   Blood pressure (!) 92/55, pulse (!) 54, temperature 97.8 F (36.6 C), temperature source Oral, resp. rate 16, height 5\' 8"  (1.727 m), weight 58.1 kg, SpO2 98 %.Body mass index is  19.46 kg/m.  General Appearance: Casual  Eye Contact:  Fair  Speech:  Clear and Coherent  Volume:  Normal  Mood:  Euthymic  Affect:  Appropriate  Thought Process:  Coherent and Goal Directed  Orientation:  Full (Time, Place, and Person)  Thought Content:  Goal directed, denies AVH  Suicidal Thoughts:  denies  Homicidal Thoughts:  No  Memory:  Immediate;   Fair  Judgement:  Impaired  Insight:  Lacking  Psychomotor Activity:  Normal  Concentration:  Concentration: Fair  Recall:  [employer] of Knowledge:  Fair  Language:  Fair  Akathisia:  No      Assets:  Resilience  ADL's:  Intact  Cognition:  WNL  Sleep:  Number of Hours: 7     Treatment Plan Summary: 37 yo male admitted due to psychosis. Psychosis improving slowly.   Plan:  Next Invega Injection due on 10/16 -Continue Depakote 1000 mg qam and 750 mg qhs -Continue Haldol 5 mg qhs  Dispo -Waiting placement Beverly Sessions, MD 05/10/2018, 12:37 PMPatient ID: David Davila., male   DOB: 1981-01-13, 37 y.o.   MRN: [id number] Patient ID: David Davila., male   DOB: 12-Jul-1981, 37 y.o.   MRN: [id number]

## 2018-05-10 NOTE — Progress Notes (Signed)
Patient has been cooperative with treatment, and interacted well with peers and staff. He was medication compliant no inappropriate behaviors to report on shift at this time. In bed resting at this time. 

## 2018-05-11 MED ORDER — TRAZODONE HCL 100 MG PO TABS: 100.0000 mg | ORAL_TABLET | Freq: Every evening | ORAL | 1 refills | Status: DC | PRN

## 2018-05-11 MED ORDER — DIVALPROEX SODIUM 250 MG PO DR TAB: 750.0000 mg | DELAYED_RELEASE_TABLET | Freq: Every day | ORAL | 1 refills | Status: DC

## 2018-05-11 MED ORDER — BENZTROPINE MESYLATE 1 MG PO TABS: 1.0000 mg | ORAL_TABLET | Freq: Every day | ORAL | 1 refills | Status: DC

## 2018-05-11 MED ORDER — HALOPERIDOL 5 MG PO TABS: 5.0000 mg | ORAL_TABLET | Freq: Every day | ORAL | 1 refills | Status: DC

## 2018-05-11 MED ORDER — DIVALPROEX SODIUM 500 MG PO DR TAB: 1000.0000 mg | DELAYED_RELEASE_TABLET | ORAL | 1 refills | Status: DC

## 2018-05-11 NOTE — Plan of Care (Signed)
Patient is compliant and doing well in the unit with out any issues , awaits placement , sleep long hours and well rested, voice no complain, denies any SI/HI and denies hearing voices    Problem: Education: Goal: Knowledge of [place] [facility] Education information/materials will improve Outcome: Progressing Goal: Emotional status will improve Outcome: Progressing Goal: Mental status will improve Outcome: Progressing Goal: Verbalization of understanding the information provided will improve Outcome: Progressing   Problem: Activity: Goal: Interest or engagement in activities will improve Outcome: Progressing Goal: Sleeping patterns will improve Outcome: Progressing   Problem: Coping: Goal: Ability to verbalize frustrations and anger appropriately will improve Outcome: Progressing Goal: Ability to demonstrate self-control will improve Outcome: Progressing   Problem: Health Behavior/Discharge Planning: Goal: Identification of resources available to assist in meeting health care needs will improve Outcome: Progressing Goal: Compliance with treatment plan for underlying cause of condition will improve Outcome: Progressing   Problem: Physical Regulation: Goal: Ability to maintain clinical measurements within normal limits will improve Outcome: Progressing   Problem: Safety: Goal: Periods of time without injury will increase Outcome: Progressing   Problem: Activity: Goal: Will verbalize the importance of balancing activity with adequate rest periods Outcome: Progressing   Problem: Education: Goal: Will be free of psychotic symptoms Outcome: Progressing Goal: Knowledge of the prescribed therapeutic regimen will improve Outcome: Progressing   Problem: Coping: Goal: Coping ability will improve Outcome: Progressing Goal: Will verbalize feelings Outcome: Progressing   Problem: Health Behavior/Discharge Planning: Goal: Compliance with prescribed medication regimen will  improve Outcome: Progressing   Problem: Nutritional: Goal: Ability to achieve adequate nutritional intake will improve Outcome: Progressing   Problem: Role Relationship: Goal: Ability to communicate needs accurately will improve Outcome: Progressing Goal: Ability to interact with others will improve Outcome: Progressing   Problem: Safety: Goal: Ability to redirect hostility and anger into socially appropriate behaviors will improve Outcome: Progressing Goal: Ability to remain free from injury will improve Outcome: Progressing   Problem: Self-Care: Goal: Ability to participate in self-care as condition permits will improve Outcome: Progressing   Problem: Self-Concept: Goal: Will verbalize positive feelings about self Outcome: Progressing   Problem: Safety: Goal: Ability to remain free from injury will improve Outcome: Progressing

## 2018-05-11 NOTE — [facility] Group Notes (Signed)
[facility] Group Notes:  (Nursing/MHT/Case Management/Adjunct)  Date:  05/11/2018  Time:  9:42 AM  Type of Therapy:  Psychoeducational Skills  Participation Level:  Did Not Attend  Foy Gu[place] 05/11/2018, 9:42 AM

## 2018-05-11 NOTE — Progress Notes (Signed)
Recreation Therapy Notes   Date: 05/11/2018  Time: 9:30 am   Location: Craft room   Behavioral response: N/A   Intervention Topic: Team work  Discussion/Intervention: Patient did not attend group.   Clinical Observations/Feedback:  Patient did not attend group.   Sajad Glander LRT/CTRS        Calixto Pavel 05/11/2018 10:17 AM

## 2018-05-11 NOTE — Plan of Care (Signed)
Patient is more visible in the milieu today.Patient stated that he is happy that he is going to be discharged soon.Appropriate with staff & peers.Compliant with medications.Attended groups.Appetite and energy level good.Support and encouragement given.

## 2018-05-11 NOTE — Plan of Care (Signed)
  Problem: Education: Goal: Knowledge of [facility] Education information/materials will improve Outcome: Progressing Goal: Emotional status will improve Outcome: Progressing Goal: Mental status will improve Outcome: Progressing Goal: Verbalization of understanding the information provided will improve Outcome: Progressing   Problem: Activity: Goal: Interest or engagement in activities will improve Outcome: Progressing Goal: Sleeping patterns will improve Outcome: Progressing   Problem: Coping: Goal: Ability to verbalize frustrations and anger appropriately will improve Outcome: Progressing Goal: Ability to demonstrate self-control will improve Outcome: Progressing   Problem: Health Behavior/Discharge Planning: Goal: Identification of resources available to assist in meeting health care needs will improve Outcome: Progressing Goal: Compliance with treatment plan for underlying cause of condition will improve Outcome: Progressing   Problem: Physical Regulation: Goal: Ability to maintain clinical measurements within normal limits will improve Outcome: Progressing   Problem: Safety: Goal: Periods of time without injury will increase Outcome: Progressing   Problem: Activity: Goal: Will verbalize the importance of balancing activity with adequate rest periods Outcome: Progressing   Problem: Education: Goal: Will be free of psychotic symptoms Outcome: Progressing Goal: Knowledge of the prescribed therapeutic regimen will improve Outcome: Progressing   Problem: Coping: Goal: Coping ability will improve Outcome: Progressing Goal: Will verbalize feelings Outcome: Progressing   Problem: Health Behavior/Discharge Planning: Goal: Compliance with prescribed medication regimen will improve Outcome: Progressing   Problem: Nutritional: Goal: Ability to achieve adequate nutritional intake will improve Outcome: Progressing   Problem: Role Relationship: Goal:  Ability to communicate needs accurately will improve Outcome: Progressing Goal: Ability to interact with others will improve Outcome: Progressing   Problem: Safety: Goal: Ability to redirect hostility and anger into socially appropriate behaviors will improve Outcome: Progressing Goal: Ability to remain free from injury will improve Outcome: Progressing   Problem: Self-Care: Goal: Ability to participate in self-care as condition permits will improve Outcome: Progressing   Problem: Self-Concept: Goal: Will verbalize positive feelings about self Outcome: Progressing   Problem: Safety: Goal: Ability to remain free from injury will improve Outcome: Progressing   

## 2018-05-11 NOTE — Progress Notes (Signed)
[facility] MD Progress Note  05/11/2018 3:37 PM Faythe Casa.  MRN:  [id number] Subjective:  Pt is in very pleasant mood today. He is happy and smiling. He states that he met with the group home today and they are still able to accept him but they are waiting for the money from his last group home. He states that he is excited to be discharged soon. He states that he has been reading the dictionary and trying to learn new works. He states that he enjoys going to [employer] to read and learn new things. He is very polite today. He is out of his room more. He enjoyed his lunch today. He is organized in thoughts.   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 Undifferentiated schizophrenia ([place]) [F20.3] 03/26/2018    Priority: High  . TBI (traumatic brain injury) ([place]) [S06.9X9A] 07/10/2016  . Vitamin B12 deficiency [E53.8] 07/10/2016  . Noncompliance with medication regimen [Z91.14] 06/25/2016  . Cocaine use disorder, mild, abuse ([place]) [F14.10] 06/25/2016  . Cannabis use disorder, mild, abuse [F12.10] 06/25/2016  . Paranoid schizophrenia ([place]) [F20.0] 06/24/2016   Total Time spent with patient: 15 minutes  Past Psychiatric History: See H&p  Past Medical History:  Past Medical History:  Diagnosis Date  . Acute psychosis ([place])   . Antisocial personality disorder ([place])   . Cannabis abuse    Past  . Paranoid schizophrenia ([place])   . Schizoaffective disorder, bipolar type ([place])    History reviewed. No pertinent surgical history. Family History: History reviewed. No pertinent family history. Family Psychiatric  History: See H&P Social History:  Social History   Substance and Sexual Activity  Alcohol Use No     Social History   Substance and Sexual Activity  Drug Use Not Currently    Social History   Socioeconomic History  . Marital status: Single    Spouse name: Not on file  . Number of  children: Not on file  . Years of education: Not on file  . Highest education level: Not on file  Occupational History  . Not on file  Social Needs  . Financial resource strain: Not on file  . Food insecurity:    Worry: Not on file    Inability: Not on file  . Transportation needs:    Medical: Not on file    Non-medical: Not on file  Tobacco Use  . Smoking status: Former Smoker    Packs/day: 1.00    Types: Cigarettes  . Smokeless tobacco: Never Used  Substance and Sexual Activity  . Alcohol use: No  . Drug use: Not Currently  . Sexual activity: Not Currently  Lifestyle  . Physical activity:    Days per week: Not on file    Minutes per session: Not on file  . Stress: Not on file  Relationships  . Social connections:    Talks on phone: Not on file    Gets together: Not on file    Attends religious service: Not on file    Active member of club or organization: Not on file    Attends meetings of clubs or organizations: Not on file    Relationship status: Not on file  Other Topics Concern  . Not on file  Social History Narrative  . Not on file   Additional Social History:  Sleep: Good  Appetite:  Good  Current Medications: Current Facility-Administered Medications  Medication Dose Route Frequency Provider Last Rate Last Dose  . acetaminophen (TYLENOL) tablet 650 mg  650 mg Oral Q6H PRN Clapacs, John T, MD      . alum & mag hydroxide-simeth (MAALOX/MYLANTA) 200-200-20 MG/5ML suspension 30 mL  30 mL Oral Q4H PRN Clapacs, John T, MD      . benztropine (COGENTIN) tablet 1 mg  1 mg Oral BID PRN Jacque Byron, Tyson Babinski, MD   1 mg at 04/08/18 2048   Or  . benztropine mesylate (COGENTIN) injection 1 mg  1 mg Intramuscular BID PRN Angeletta Goelz, Tyson Babinski, MD      . benztropine (COGENTIN) tablet 1 mg  1 mg Oral QHS Brooklinn Longbottom, Tyson Babinski, MD   1 mg at 05/10/18 2113  . divalproex (DEPAKOTE) DR tablet 1,000 mg  1,000 mg Oral BH-q7a Daneya Hartgrove R, MD   1,000 mg at  05/11/18 0753   And  . divalproex (DEPAKOTE) DR tablet 750 mg  750 mg Oral QHS Tiffony Kite R, MD   750 mg at 05/10/18 2113  . haloperidol (HALDOL) tablet 5 mg  5 mg Oral Q6H PRN Saniya Tranchina, Tyson Babinski, MD       Or  . haloperidol lactate (HALDOL) injection 5 mg  5 mg Intramuscular Q6H PRN Hannalee Castor, Tyson Babinski, MD   5 mg at 04/01/18 0819  . haloperidol (HALDOL) tablet 5 mg  5 mg Oral QHS Kayce Betty, Tyson Babinski, MD   5 mg at 05/10/18 2113  . hydrOXYzine (ATARAX/VISTARIL) tablet 50 mg  50 mg Oral TID PRN Gonzella Lex, MD   50 mg at 05/09/18 2119  . LORazepam (ATIVAN) tablet 1 mg  1 mg Oral Q6H PRN Marylin Crosby, MD   1 mg at 04/16/18 2134   Or  . LORazepam (ATIVAN) injection 1 mg  1 mg Intramuscular Q6H PRN Marylin Crosby, MD   1 mg at 04/01/18 0820  . magnesium hydroxide (MILK OF MAGNESIA) suspension 30 mL  30 mL Oral Daily PRN Clapacs, John T, MD      . traZODone (DESYREL) tablet 100 mg  100 mg Oral QHS PRN Clapacs, Madie Reno, MD   100 mg at 05/09/18 2119    Lab Results: No results found for this or any previous visit (from the past 48 hour(s)).  Blood Alcohol level:  Lab Results  Component Value Date   ETH <10 03/25/2018   ETH <10 05/28/5101    Metabolic Disorder Labs: Lab Results  Component Value Date   HGBA1C 5.4 03/25/2018   MPG 108.28 03/25/2018   No results found for: PROLACTIN Lab Results  Component Value Date   CHOL 184 03/25/2018   TRIG 43 03/25/2018   HDL 50 03/25/2018   CHOLHDL 3.7 03/25/2018   VLDL 9 03/25/2018   LDLCALC 125 (H) 03/25/2018    Physical Findings: AIMS: Facial and Oral Movements Muscles of Facial Expression: None, normal Lips and Perioral Area: None, normal Jaw: None, normal Tongue: None, normal,Extremity Movements Upper (arms, wrists, hands, fingers): None, normal Lower (legs, knees, ankles, toes): None, normal, Trunk Movements Neck, shoulders, hips: None, normal, Overall Severity Severity of abnormal movements (highest score from questions above): None,  normal Incapacitation due to abnormal movements: None, normal Patient's awareness of abnormal movements (rate only patient's report): No Awareness, Dental Status Current problems with teeth and/or dentures?: No Does patient usually wear dentures?: No  CIWA:  CIWA-Ar Total: 0 COWS:  COWS  Total Score: 0  Musculoskeletal: Strength & Muscle Tone: within normal limits Gait & Station: normal Patient leans: N/A  Psychiatric Specialty Exam: Physical Exam  Nursing note and vitals reviewed.   Review of Systems  All other systems reviewed and are negative.   Blood pressure (!) 101/51, pulse (!) 53, temperature 98.4 F (36.9 C), temperature source Oral, resp. rate 18, height '5\' 8"'  (1.727 m), weight 58.1 kg, SpO2 98 %.Body mass index is 19.46 kg/m.  General Appearance: Casual  Eye Contact:  Good  Speech:  Clear and Coherent  Volume:  Normal  Mood:  Euthymic  Affect:  Congruent  Thought Process:  Coherent and Goal Directed  Orientation:  Full (Time, Place, and Person)  Thought Content:  Logical  Suicidal Thoughts:  No  Homicidal Thoughts:  No  Memory:  Immediate;   Fair  Judgement:  Impaired  Insight:  Lacking  Psychomotor Activity:  Normal  Concentration:  Concentration: Fair  Recall:  [employer] of Knowledge:  Fair  Language:  Fair  Akathisia:  No      Assets:  Resilience  ADL's:  Intact  Cognition:  WNL  Sleep:  Number of Hours: 7.15     Treatment Plan Summary: 37 yo male admitted due to agitation which has not completely resolved. He is doing well and waiting placement to new group home.   Plan:  Schizoaffective  -Coninum Invega SUstenna. Next injection due on 10/16 -Continue Depakote 1000 mg qam and 750 mg qhs -Continue Haldol 5 mg qhs  Dispo -Waiting placement to new group home. They are attempting to sort out available funds  Marylin Crosby, MD 05/11/2018, 3:37 PM

## 2018-05-12 MED ORDER — PALIPERIDONE PALMITATE ER 234 MG/1.5ML IM SUSY: 234.0000 mg | PREFILLED_SYRINGE | Freq: Once | INTRAMUSCULAR | 0 refills | Status: DC

## 2018-05-12 MED ORDER — DIVALPROEX SODIUM 500 MG PO DR TAB: 1000.0000 mg | DELAYED_RELEASE_TABLET | ORAL | 1 refills | Status: DC

## 2018-05-12 MED ORDER — DIVALPROEX SODIUM 250 MG PO DR TAB: 750.0000 mg | DELAYED_RELEASE_TABLET | Freq: Every day | ORAL | 1 refills | Status: DC

## 2018-05-12 MED ORDER — BENZTROPINE MESYLATE 1 MG PO TABS: 1.0000 mg | ORAL_TABLET | Freq: Every day | ORAL | 1 refills | Status: DC

## 2018-05-12 MED ORDER — TRAZODONE HCL 100 MG PO TABS: 100.0000 mg | ORAL_TABLET | Freq: Every evening | ORAL | 1 refills | Status: DC | PRN

## 2018-05-12 MED ORDER — HALOPERIDOL 5 MG PO TABS: 5.0000 mg | ORAL_TABLET | Freq: Every day | ORAL | 1 refills | Status: DC

## 2018-05-12 NOTE — Progress Notes (Signed)
Received David Davila this AM in his room asleep, he was OOB in the milieu for meals. He denied all of the psychiatric symptoms and feels safe to be discharge home. No change in his status this PM. Awaiting final discharge arrangements.

## 2018-05-12 NOTE — BHH Counselor (Signed)
CSW called the patients guardian Concepcion Elk on Monday 05/11/2018 to coordinate discharge plans for the patient. CSW was told that there are issues with the patients finances being handled through an attorney and she is having to locate his information through the patients old [facility]. CSW spoke with Clydie Braun from [facility] (patients new [facility]) to see if they are still picking him up for discharge on 05/12/2018. CSW was told that they are waiting to hear from the guardian Andi Hence regarding obtaining the patients finanical benefits for placement before coming to get him at discharge. Clydie Braun reports that she is also in need of consents from the guardian for the patient. CSW spoke with the guardian again and she reports that she was able to obtain the information for the attorney and has called to inquire about changing his payments to the new [facility]. Clydie Braun from [facility] has also called the legal attorney and both are waiting for a call back.   Valentina Shaggy came on Monday 05/11/2018 to speak with the patient to provide him with updates regarding his discharge and provide support to the patient to keep him encouraged.  On Tuesday 05/12/2018 CSW called Clydie Braun from [facility] to see if they were able to get his financial benefits switched over to his new [facility] and coordinate discharge. Clydie Braun reports that she will be faxing an invoice to the patients attorney of his estate Arlys John [place]). Once the attorney reviews the invoice, he will make a decision to approve the funds or decline. Clydie Braun reports that once she hears from the attorney, she will call the CSW back to coordinate discharge. She anticipates that this could possibly take a few days, but will call him after faxing the invoice to see the turn around time. [facility] is not willing to take the patient until his financial benifits are switched over the their [facility].   Valentina Shaggy from [facility] reports  that he may come again to speak with the patient to provide him with updates regarding his discharge and provide support to the patient to keep him encouraged.  CSW will continue to coordinate with all members involved to assist in any way possible to support a successful discharge.   Johny Shears, MSW, Theresia Majors, Bridget Hartshorn Clinical Social Worker 05/12/2018 10:07 AM

## 2018-05-12 NOTE — Progress Notes (Signed)
Nursing note 7p-7a  Pt observed interacting with peers on unit this shift. Displayed a flat affect and flat but pleasant mood upon interaction with this Clinical research associate. Pt denies pain ,denies SI/HI, and also denies any audio or visual hallucinations at this time. Pt complains of insomnia, see MAR for prn medication administration.  Pt is able to verbally contract for safety with this RN. Goal: "to work on taking all my meds" Pt is now resting in bed with eyes closed, with no signs or symptoms of pain or distress noted. Pt continues to remain safe on the unit and is observed by rounding every 15 min. RN will continue to monitor.

## 2018-05-12 NOTE — Progress Notes (Signed)
[facility] MD Progress Note  05/12/2018 2:04 PM David Davila.  MRN:  [id number] Subjective:  Pt is bright and cheerful today. He states that he was hoping to go to his new group home today and is excited. Discussed that we are waiting on the financial stuff to get sorted out for him. He was very accepting of this. HE has a bright smile on his face and interacting very appropriately. HE is organized in thoughts.   Principal Problem: Undifferentiated schizophrenia (HCC) Diagnosis:   Patient Active Problem List   Diagnosis Date Noted  . Undifferentiated schizophrenia (HCC) [F20.3] 03/26/2018    Priority: High  . TBI (traumatic brain injury) (HCC) [S06.9X9A] 07/10/2016  . Vitamin B12 deficiency [E53.8] 07/10/2016  . Noncompliance with medication regimen [Z91.14] 06/25/2016  . Cocaine use disorder, mild, abuse (HCC) [F14.10] 06/25/2016  . Cannabis use disorder, mild, abuse [F12.10] 06/25/2016  . Paranoid schizophrenia (HCC) [F20.0] 06/24/2016   Total Time spent with patient: 15 minutes  Past Psychiatric History: See H&p  Past Medical History:  Past Medical History:  Diagnosis Date  . Acute psychosis (HCC)   . Antisocial personality disorder (HCC)   . Cannabis abuse    Past  . Paranoid schizophrenia (HCC)   . Schizoaffective disorder, bipolar type (HCC)    History reviewed. No pertinent surgical history. Family History: History reviewed. No pertinent family history. Family Psychiatric  History: See H&P Social History:  Social History   Substance and Sexual Activity  Alcohol Use No     Social History   Substance and Sexual Activity  Drug Use Not Currently    Social History   Socioeconomic History  . Marital status: Single    Spouse name: Not on file  . Number of children: Not on file  . Years of education: Not on file  . Highest education level: Not on file  Occupational History  . Not on file  Social Needs  . Financial resource strain: Not on file  . Food  insecurity:    Worry: Not on file    Inability: Not on file  . Transportation needs:    Medical: Not on file    Non-medical: Not on file  Tobacco Use  . Smoking status: Former Smoker    Packs/day: 1.00    Types: Cigarettes  . Smokeless tobacco: Never Used  Substance and Sexual Activity  . Alcohol use: No  . Drug use: Not Currently  . Sexual activity: Not Currently  Lifestyle  . Physical activity:    Days per week: Not on file    Minutes per session: Not on file  . Stress: Not on file  Relationships  . Social connections:    Talks on phone: Not on file    Gets together: Not on file    Attends religious service: Not on file    Active member of club or organization: Not on file    Attends meetings of clubs or organizations: Not on file    Relationship status: Not on file  Other Topics Concern  . Not on file  Social History Narrative  . Not on file   Additional Social History:                         Sleep: Good  Appetite:  Good  Current Medications: Current Facility-Administered Medications  Medication Dose Route Frequency Provider Last Rate Last Dose  . acetaminophen (TYLENOL) tablet 650 mg  650 mg Oral Q6H PRN  Clapacs, Jackquline Denmark, MD      . alum & mag hydroxide-simeth (MAALOX/MYLANTA) 200-200-20 MG/5ML suspension 30 mL  30 mL Oral Q4H PRN Clapacs, John T, MD      . benztropine (COGENTIN) tablet 1 mg  1 mg Oral BID PRN Maximiano Lott, Ileene Hutchinson, MD   1 mg at 04/08/18 2048   Or  . benztropine mesylate (COGENTIN) injection 1 mg  1 mg Intramuscular BID PRN Keisha Amer, Ileene Hutchinson, MD      . benztropine (COGENTIN) tablet 1 mg  1 mg Oral QHS Delonna Ney, Ileene Hutchinson, MD   1 mg at 05/11/18 2102  . divalproex (DEPAKOTE) DR tablet 1,000 mg  1,000 mg Oral BH-q7a Siboney Requejo R, MD   1,000 mg at 05/12/18 1610   And  . divalproex (DEPAKOTE) DR tablet 750 mg  750 mg Oral QHS Roger Kettles R, MD   750 mg at 05/11/18 2102  . haloperidol (HALDOL) tablet 5 mg  5 mg Oral Q6H PRN Peyton Spengler, Ileene Hutchinson, MD       Or   . haloperidol lactate (HALDOL) injection 5 mg  5 mg Intramuscular Q6H PRN Tadashi Burkel, Ileene Hutchinson, MD   5 mg at 04/01/18 0819  . haloperidol (HALDOL) tablet 5 mg  5 mg Oral QHS Mikaiah Stoffer R, MD   5 mg at 05/11/18 2136  . hydrOXYzine (ATARAX/VISTARIL) tablet 50 mg  50 mg Oral TID PRN Audery Amel, MD   50 mg at 05/09/18 2119  . LORazepam (ATIVAN) tablet 1 mg  1 mg Oral Q6H PRN Haskell Riling, MD   1 mg at 04/16/18 2134   Or  . LORazepam (ATIVAN) injection 1 mg  1 mg Intramuscular Q6H PRN Haskell Riling, MD   1 mg at 04/01/18 0820  . magnesium hydroxide (MILK OF MAGNESIA) suspension 30 mL  30 mL Oral Daily PRN Clapacs, John T, MD      . traZODone (DESYREL) tablet 100 mg  100 mg Oral QHS PRN Clapacs, Jackquline Denmark, MD   100 mg at 05/11/18 2102    Lab Results: No results found for this or any previous visit (from the past 48 hour(s)).  Blood Alcohol level:  Lab Results  Component Value Date   ETH <10 03/25/2018   ETH <10 12/06/2017    Metabolic Disorder Labs: Lab Results  Component Value Date   HGBA1C 5.4 03/25/2018   MPG 108.28 03/25/2018   No results found for: PROLACTIN Lab Results  Component Value Date   CHOL 184 03/25/2018   TRIG 43 03/25/2018   HDL 50 03/25/2018   CHOLHDL 3.7 03/25/2018   VLDL 9 03/25/2018   LDLCALC 125 (H) 03/25/2018    Physical Findings: AIMS: Facial and Oral Movements Muscles of Facial Expression: None, normal Lips and Perioral Area: None, normal Jaw: None, normal Tongue: None, normal,Extremity Movements Upper (arms, wrists, hands, fingers): None, normal Lower (legs, knees, ankles, toes): None, normal, Trunk Movements Neck, shoulders, hips: None, normal, Overall Severity Severity of abnormal movements (highest score from questions above): None, normal Incapacitation due to abnormal movements: None, normal Patient's awareness of abnormal movements (rate only patient's report): No Awareness, Dental Status Current problems with teeth and/or dentures?:  No Does patient usually wear dentures?: No  CIWA:  CIWA-Ar Total: 0 COWS:  COWS Total Score: 0  Musculoskeletal: Strength & Muscle Tone: within normal limits Gait & Station: normal Patient leans: N/A  Psychiatric Specialty Exam: Physical Exam  Nursing note and vitals reviewed.   Review of  Systems  All other systems reviewed and are negative.   Blood pressure (!) 101/51, pulse (!) 53, temperature 98.4 F (36.9 C), temperature source Oral, resp. rate 18, height 5\' 8"  (1.727 m), weight 58.1 kg, SpO2 98 %.Body mass index is 19.46 kg/m.  General Appearance: Casual  Eye Contact:  Good  Speech:  Clear and Coherent  Volume:  Normal  Mood:  Euthymic  Affect:  Appropriate  Thought Process:  Coherent and Goal Directed  Orientation:  Full (Time, Place, and Person)  Thought Content:  Logical  Suicidal Thoughts:  No  Homicidal Thoughts:  No  Memory:  Immediate;   Fair  Judgement:  Impaired  Insight:  Lacking  Psychomotor Activity:  Normal  Concentration:  Concentration: Fair  Recall:  [employer] of Knowledge:  Fair  Language:  Fair  Akathisia:  No      Assets:  Resilience  ADL's:  Intact  Cognition:  WNL  Sleep:  Number of Hours: 7     Treatment Plan Summary: 37 yo male admitted due to psychosis. He is much improved. He is waiting acceptance into new group home.   Plan:  Continue Gean Birchwood. Next injection due on 10/16 -Continue Depakote 1000 mg qam and 750 mg qhs  Dispo -Waiting placement  Haskell Riling, MD 05/12/2018, 2:04 PM

## 2018-05-12 NOTE — Plan of Care (Addendum)
Patient found in bed awake upon my arrival. Patient is visible and somewhat social this evening. Patient stated mood is "okay" and affect is slightly more animated. Remains minimally verbal but does answer direct questions appropriately. Denies all complaints including SI/HI/AVH. Denies pain. Reports eating and voiding adequately. Verbalized acceptance of education. Processing has improved. Compliant with HS medications and staff direction. Q 15 minute checks maintained. Will continue to monitor throughout the shift. Patient slept 6 hours. No apparent distress. Will endorse care to oncoming shift.  Problem: Education: Goal: Emotional status will improve Outcome: Progressing Goal: Mental status will improve Outcome: Progressing Goal: Verbalization of understanding the information provided will improve Outcome: Progressing   Problem: Activity: Goal: Interest or engagement in activities will improve Outcome: Progressing Goal: Sleeping patterns will improve Outcome: Progressing   Problem: Coping: Goal: Ability to demonstrate self-control will improve Outcome: Progressing   Problem: Education: Goal: Will be free of psychotic symptoms Outcome: Progressing Goal: Knowledge of the prescribed therapeutic regimen will improve Outcome: Progressing

## 2018-05-12 NOTE — Progress Notes (Signed)
Recreation Therapy Notes  INPATIENT RECREATION TR PLAN  Patient Details Name: David Davila. MRN: [id number] DOB: Apr 19, 1981 Today's Date: 05/12/2018  Rec Therapy Plan Is patient appropriate for Therapeutic Recreation?: Yes Treatment times per week: at least 3 Estimated Length of Stay: 5-7 days TR Treatment/Interventions: Group participation (Comment)  Discharge Criteria Pt will be discharged from therapy if:: Discharged Treatment plan/goals/alternatives discussed and agreed upon by:: Patient/family  Discharge Summary Short term goals set: Patient will engage in interactions with peers and staff in pro-social manner at least 2x within 5 recreation therapy group sessions Short term goals met: Not met Progress toward goals comments: Groups attended Which groups?: Leisure education Reason goals not met: Patient spent most of his time in his room Therapeutic equipment acquired: N/A Reason patient discharged from therapy: Discharge from hospital Pt/family agrees with progress & goals achieved: Yes Date patient discharged from therapy: 05/12/18   Sheriann Newmann 05/12/2018, 3:07 PM

## 2018-05-12 NOTE — BHH Suicide Risk Assessment (Signed)
[facility] Discharge Suicide Risk Assessment   Principal Problem: Undifferentiated schizophrenia [facility]) Discharge Diagnoses:  Patient Active Problem List   Diagnosis Date Noted  . Undifferentiated schizophrenia (HCC) [F20.3] 03/26/2018    Priority: High  . TBI (traumatic brain injury) (HCC) [S06.9X9A] 07/10/2016  . Vitamin B12 deficiency [E53.8] 07/10/2016  . Noncompliance with medication regimen [Z91.14] 06/25/2016  . Cocaine use disorder, mild, abuse (HCC) [F14.10] 06/25/2016  . Cannabis use disorder, mild, abuse [F12.10] 06/25/2016  . Paranoid schizophrenia (HCC) [F20.0] 06/24/2016    Mental Status Per Nursing Assessment::   On Admission:  NA  Demographic Factors:  Male and Unemployed  Loss Factors: NA  Historical Factors: Impulsivity  Risk Reduction Factors:   Living with another person, especially a relative and Positive social support  Continued Clinical Symptoms:  Schizophrenia:   Less than 35 years old  Cognitive Features That Contribute To Risk:  None    Suicide Risk:  Minimal: No identifiable suicidal ideation.     Haskell Riling, MD 05/12/2018, 2:03 PM

## 2018-05-12 NOTE — Progress Notes (Signed)
Recreation Therapy Notes  Date: 05/12/2018  Time: 9:30 am   Location: Craft room   Behavioral response: N/A   Intervention Topic: Coping skills  Discussion/Intervention: Patient did not attend group.   Clinical Observations/Feedback:  Patient did not attend group.   Mechele Kittleson LRT/CTRS         David Davila 05/12/2018 10:46 AM 

## 2018-05-13 NOTE — BHH Group Notes (Signed)

## 2018-05-13 NOTE — Progress Notes (Signed)
Received David Davila this AM after breakfast, he denied all of the psychiatric symptoms. He is OOB at intervals in the milieu and for his meals.

## 2018-05-13 NOTE — BHH Group Notes (Signed)

## 2018-05-13 NOTE — BHH Counselor (Signed)
CSW called the patients guardian David Davila and his new group home [facility] to see if there are any updates on the patients discharge plan. The guardian doesn't feel that there will not be an issues with the patients attorney directing his funds to his new group home. Clydie Braun from [facility] is suppose to send an invoice to the attorney. We are all still waiting on a response. There are no new updates reported.  Johny Shears, MSW, Theresia Majors, Bridget Hartshorn Clinical Social Worker 05/13/2018 2:23 PM

## 2018-05-13 NOTE — Progress Notes (Signed)
Recreation Therapy Notes  Date: 05/13/18  Time: 9:30 am   Location: Craft room   Behavioral response: N/A   Intervention Topic: Self-care   Discussion/Intervention: Patient did not attend group.   Clinical Observations/Feedback:  Patient did not attend group.        David Davila 05/13/2018 9:57 AM

## 2018-05-13 NOTE — Tx Team (Signed)
Interdisciplinary Treatment and Diagnostic Plan Update  05/13/2018 Time of Session: 8:30am David Davila. MRN: [id number]  Principal Diagnosis: Undifferentiated schizophrenia (HCC)  Secondary Diagnoses: Principal Problem:   Undifferentiated schizophrenia (HCC)   Current Medications:  Current Facility-Administered Medications  Medication Dose Route Frequency Provider Last Rate Last Dose  . acetaminophen (TYLENOL) tablet 650 mg  650 mg Oral Q6H PRN Clapacs, John T, MD      . alum & mag hydroxide-simeth (MAALOX/MYLANTA) 200-200-20 MG/5ML suspension 30 mL  30 mL Oral Q4H PRN Clapacs, John T, MD      . benztropine (COGENTIN) tablet 1 mg  1 mg Oral BID PRN McNew, Ileene Hutchinson, MD   1 mg at 04/08/18 2048   Or  . benztropine mesylate (COGENTIN) injection 1 mg  1 mg Intramuscular BID PRN McNew, Ileene Hutchinson, MD      . benztropine (COGENTIN) tablet 1 mg  1 mg Oral QHS McNew, Ileene Hutchinson, MD   1 mg at 05/12/18 2115  . divalproex (DEPAKOTE) DR tablet 1,000 mg  1,000 mg Oral BH-q7a McNew, Holly R, MD   1,000 mg at 05/13/18 0640   And  . divalproex (DEPAKOTE) DR tablet 750 mg  750 mg Oral QHS McNew, Holly R, MD   750 mg at 05/12/18 2115  . haloperidol (HALDOL) tablet 5 mg  5 mg Oral Q6H PRN McNew, Ileene Hutchinson, MD       Or  . haloperidol lactate (HALDOL) injection 5 mg  5 mg Intramuscular Q6H PRN McNew, Ileene Hutchinson, MD   5 mg at 04/01/18 0819  . haloperidol (HALDOL) tablet 5 mg  5 mg Oral QHS McNew, Holly R, MD   5 mg at 05/12/18 2115  . hydrOXYzine (ATARAX/VISTARIL) tablet 50 mg  50 mg Oral TID PRN Audery Amel, MD   50 mg at 05/09/18 2119  . LORazepam (ATIVAN) tablet 1 mg  1 mg Oral Q6H PRN Haskell Riling, MD   1 mg at 04/16/18 2134   Or  . LORazepam (ATIVAN) injection 1 mg  1 mg Intramuscular Q6H PRN Haskell Riling, MD   1 mg at 04/01/18 0820  . magnesium hydroxide (MILK OF MAGNESIA) suspension 30 mL  30 mL Oral Daily PRN Clapacs, John T, MD      . traZODone (DESYREL) tablet 100 mg  100 mg Oral QHS PRN  Clapacs, Jackquline Denmark, MD   100 mg at 05/12/18 2115   PTA Medications: Medications Prior to Admission  Medication Sig Dispense Refill Last Dose  . atorvastatin (LIPITOR) 10 MG tablet Take 1 tablet (10 mg total) by mouth daily at 6 PM. (Patient not taking: Reported on 11/16/2017) 30 tablet 0 Not Taking at Unknown time  . divalproex (DEPAKOTE ER) 500 MG 24 hr tablet Take 1,000 mg by mouth daily.   unknown at unknown  . hydrOXYzine (ATARAX/VISTARIL) 25 MG tablet Take 1 tablet (25 mg total) by mouth 3 (three) times daily. (Patient not taking: Reported on 11/16/2017) 30 tablet 0 Not Taking at Unknown time  . lamoTRIgine (LAMICTAL) 100 MG tablet Take 1 tablet (100 mg total) by mouth every evening. (Patient not taking: Reported on 11/16/2017) 30 tablet 0 Not Taking at Unknown time  . lamoTRIgine (LAMICTAL) 25 MG tablet Take 2 tablets (50 mg total) by mouth daily. (Patient not taking: Reported on 11/16/2017) 60 tablet 0 Not Taking at Unknown time  . loratadine (CLARITIN) 10 MG tablet Take 10 mg by mouth daily.   unknown at unknown  .  LORazepam (ATIVAN) 0.5 MG tablet Take 1 tablet (0.5 mg total) by mouth 2 (two) times daily. (Patient not taking: Reported on 11/16/2017) 60 tablet 0 Not Taking at Unknown time  . OLANZapine zydis (ZYPREXA) 5 MG disintegrating tablet Take 1 tablet (5 mg total) by mouth at bedtime. (Patient not taking: Reported on 11/16/2017) 30 tablet 0 Not Taking at Unknown time  . paliperidone (INVEGA SUSTENNA) 156 MG/ML SUSP injection Inject 1 mL (156 mg total) into the muscle every 28 (twenty-eight) days. NEXT DOSE DUE August 13, 2016 (Patient taking differently: Inject 156 mg into the muscle every 28 (twenty-eight) days. ) 0.9 mL 0   . traZODone (DESYREL) 100 MG tablet Take 1 tablet (100 mg total) by mouth at bedtime. (Patient taking differently: Take 50 mg by mouth at bedtime. ) 30 tablet 0 Not Taking at Unknown time    Patient Stressors: Medication change or noncompliance Substance abuse  Patient  Strengths: General fund of knowledge  Treatment Modalities: Medication Management, Group therapy, Case management,  1 to 1 session with clinician, Psychoeducation, Recreational therapy.   Physician Treatment Plan for Primary Diagnosis: Undifferentiated schizophrenia (HCC) Long Term Goal(s): Improvement in symptoms so as ready for discharge   Short Term Goals: Ability to identify changes in lifestyle to reduce recurrence of condition will improve  Medication Management: Evaluate patient's response, side effects, and tolerance of medication regimen.  Therapeutic Interventions: 1 to 1 sessions, Unit Group sessions and Medication administration.  Evaluation of Outcomes: Progressing   9/9:Schizoaffective disorder -Next [place] Sust[place] injection due on 04/30/18 -Continue Depakote 1000 mg qam and 750 mg qhs -Continue Haldol 5 mg dailyPt is in good mood today. He is smiling and less irritable. He is hoping to get an interview with a group home so he can get out of the hospital. He states taht he is feeling good. He states, "I'm not talking to myself anymore."    Physician Treatment Plan for Secondary Diagnosis: Principal Problem:   Undifferentiated schizophrenia (HCC)  Long Term Goal(s): Improvement in symptoms so as ready for discharge   Short Term Goals: Ability to identify changes in lifestyle to reduce recurrence of condition will improve     Medication Management: Evaluate patient's response, side effects, and tolerance of medication regimen.  Therapeutic Interventions: 1 to 1 sessions, Unit Group sessions and Medication administration.  Evaluation of Outcomes: Progressing   RN Treatment Plan for Primary Diagnosis: Undifferentiated schizophrenia (HCC) Long Term Goal(s): Knowledge of disease and therapeutic regimen to maintain health will improve  Short Term Goals: Ability to verbalize feelings will improve, Ability to identify and develop effective coping behaviors will improve  and Compliance with prescribed medications will improve  Medication Management: RN will administer medications as ordered by provider, will assess and evaluate patient's response and provide education to patient for prescribed medication. RN will report any adverse and/or side effects to prescribing provider.  Therapeutic Interventions: 1 on 1 counseling sessions, Psychoeducation, Medication administration, Evaluate responses to treatment, Monitor vital signs and CBGs as ordered, Perform/monitor CIWA, COWS, AIMS and Fall Risk screenings as ordered, Perform wound care treatments as ordered.  Evaluation of Outcomes: Progressing   LCSW Treatment Plan for Primary Diagnosis: Undifferentiated schizophrenia (HCC) Long Term Goal(s): Safe transition to appropriate next level of care at discharge, Engage patient in therapeutic group addressing interpersonal concerns.  Short Term Goals: Engage patient in aftercare planning with referrals and resources, Increase social support, Increase emotional regulation, Identify triggers associated with mental health/substance abuse issues and Increase skills for  wellness and recovery  Therapeutic Interventions: Assess for all discharge needs, 1 to 1 time with Social worker, Explore available resources and support systems, Assess for adequacy in community support network, Educate family and significant other(s) on suicide prevention, Complete Psychosocial Assessment, Interpersonal group therapy.  Evaluation of Outcomes: Progressing   Progress in Treatment: Attending groups: No. Participating in groups: No. Taking medication as prescribed: Yes. Toleration medication: Yes. Family/Significant other contact made: Yes, individual(s) contacted:  Patients Guardian Patient understands diagnosis: Yes. Discussing patient identified problems/goals with staff: Yes. Medical problems stabilized or resolved: Yes. Denies suicidal/homicidal ideation: Yes. Issues/concerns per  patient self-inventory: No. Other:   New problem(s) identified: No, Describe:  None  New Short Term/Long Term Goal(s): "Take care of my family."  Patient Goals:   "Take care of my family."  Discharge Plan or Barriers: To discharge to a new group home and engage with  ACT Team. CSW is waiting on patients attorney to process funds to the patients new group home placement.  Reason for Continuation of Hospitalization: Medication stabilization  Estimated Length of Stay: 1-3 days Recreational Therapy: Patient Stressors: N/A Patient Goal: Patient will engage in interactions with peers and staff in pro-social manner at least 2x within 5 recreation therapy group sessions   Attendees: Patient:  05/13/2018 10:25 AM  Physician: Corinna Gab, MD 05/13/2018 10:25 AM  Nursing: 05/13/2018 10:25 AM  RN Care Manager: 05/13/2018 10:25 AM  Social Worker: Johny Shears, LCSWA 05/13/2018 10:25 AM  Recreational Therapist:  05/13/2018 10:25 AM  Other: Huey Romans, LCSW 05/13/2018 10:25 AM  Other:Dr. Jennet Maduro, MD 05/13/2018 10:25 AM  Other:Elon Student/ [facility]- [facility] 05/13/2018 10:25 AM    Scribe for Treatment Team: Johny Shears, LCSW 05/13/2018 10:25 AM

## 2018-05-13 NOTE — Progress Notes (Signed)
Patient ID: David Davila., male   DOB: 05/07/1981, 37 y.o.   MRN: [id number] PER STATE REGULATIONS 482.30  THIS CHART WAS REVIEWED FOR MEDICAL NECESSITY WITH RESPECT TO THE PATIENT'S ADMISSION/ DURATION OF STAY.  NEXT REVIEW DATE: 05/17/2018  [name], RN, BSN CASE MANAGER

## 2018-05-13 NOTE — Progress Notes (Signed)
[facility] MD Progress Note  05/13/2018 2:30 PM David Davila.  MRN:  [id number] Subjective:  Pt states that he is doing fine. No complaints today. Denies AH, VH. HI, SI. He has been waiting very patiently to go to new group home. He is eating and sleeping well. He is organized in thoughts and very pleasant.   Principal Problem: Undifferentiated schizophrenia (HCC) Diagnosis:   Patient Active Problem List   Diagnosis Date Noted  . Undifferentiated schizophrenia (HCC) [F20.3] 03/26/2018    Priority: High  . TBI (traumatic brain injury) (HCC) [S06.9X9A] 07/10/2016  . Vitamin B12 deficiency [E53.8] 07/10/2016  . Noncompliance with medication regimen [Z91.14] 06/25/2016  . Cocaine use disorder, mild, abuse (HCC) [F14.10] 06/25/2016  . Cannabis use disorder, mild, abuse [F12.10] 06/25/2016  . 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 Acute psychosis (HCC)   . Antisocial personality disorder (HCC)   . Cannabis abuse    Past  . Paranoid schizophrenia (HCC)   . Schizoaffective disorder, bipolar type (HCC)    History reviewed. No pertinent surgical history. Family History: History reviewed. No pertinent family history. Family Psychiatric  History: See h&P Social History:  Social History   Substance and Sexual Activity  Alcohol Use No     Social History   Substance and Sexual Activity  Drug Use Not Currently    Social History   Socioeconomic History  . Marital status: Single    Spouse name: Not on file  . Number of children: Not on file  . Years of education: Not on file  . Highest education level: Not on file  Occupational History  . Not on file  Social Needs  . Financial resource strain: Not on file  . Food insecurity:    Worry: Not on file    Inability: Not on file  . Transportation needs:    Medical: Not on  file    Non-medical: Not on file  Tobacco Use  . Smoking status: Former Smoker    Packs/day: 1.00    Types: Cigarettes  . Smokeless tobacco: Never Used  Substance and Sexual Activity  . Alcohol use: No  . Drug use: Not Currently  . Sexual activity: Not Currently  Lifestyle  . Physical activity:    Days per week: Not on file    Minutes per session: Not on file  . Stress: Not on file  Relationships  . Social connections:    Talks on phone: Not on file    Gets together: Not on file    Attends religious service: Not on file    Active member of club or organization: Not on file    Attends meetings of clubs or organizations: Not on file    Relationship status: Not on file  Other Topics Concern  . Not on file  Social History Narrative  . Not on file   Additional Social History:                         Sleep: Good  Appetite:  Good  Current Medications: Current Facility-Administered Medications  Medication Dose Route Frequency Provider Last Rate Last Dose  . acetaminophen (TYLENOL) tablet 650 mg  650 mg Oral Q6H PRN Clapacs, John T, MD      . alum & mag hydroxide-simeth (MAALOX/MYLANTA) 200-200-20 MG/5ML suspension 30 mL  30 mL Oral Q4H PRN Clapacs, John T, MD      . benztropine (COGENTIN) tablet 1 mg  1 mg Oral BID PRN Jahnessa Vanduyn, Ileene Hutchinson, MD   1 mg at 04/08/18 2048   Or  . benztropine mesylate (COGENTIN) injection 1 mg  1 mg Intramuscular BID PRN Lekeya Rollings, Ileene Hutchinson, MD      . benztropine (COGENTIN) tablet 1 mg  1 mg Oral QHS Ivanell Deshotel, Ileene Hutchinson, MD   1 mg at 05/12/18 2115  . divalproex (DEPAKOTE) DR tablet 1,000 mg  1,000 mg Oral BH-q7a Gladyes Kudo R, MD   1,000 mg at 05/13/18 0640   And  . divalproex (DEPAKOTE) DR tablet 750 mg  750 mg Oral QHS Zeddie Njie R, MD   750 mg at 05/12/18 2115  . haloperidol (HALDOL) tablet 5 mg  5 mg Oral Q6H PRN Dalayah Deahl, Ileene Hutchinson, MD       Or  . haloperidol lactate (HALDOL) injection 5 mg  5 mg Intramuscular Q6H PRN Fenix Ruppe, Ileene Hutchinson, MD   5 mg at  04/01/18 0819  . haloperidol (HALDOL) tablet 5 mg  5 mg Oral QHS Keondrick Dilks R, MD   5 mg at 05/12/18 2115  . hydrOXYzine (ATARAX/VISTARIL) tablet 50 mg  50 mg Oral TID PRN Audery Amel, MD   50 mg at 05/09/18 2119  . LORazepam (ATIVAN) tablet 1 mg  1 mg Oral Q6H PRN Haskell Riling, MD   1 mg at 04/16/18 2134   Or  . LORazepam (ATIVAN) injection 1 mg  1 mg Intramuscular Q6H PRN Haskell Riling, MD   1 mg at 04/01/18 0820  . magnesium hydroxide (MILK OF MAGNESIA) suspension 30 mL  30 mL Oral Daily PRN Clapacs, John T, MD      . traZODone (DESYREL) tablet 100 mg  100 mg Oral QHS PRN Clapacs, Jackquline Denmark, MD   100 mg at 05/12/18 2115    Lab Results: No results found for this or any previous visit (from the past 48 hour(s)).  Blood Alcohol level:  Lab Results  Component Value Date   ETH <10 03/25/2018   ETH <10 12/06/2017    Metabolic Disorder Labs: Lab Results  Component Value Date   HGBA1C 5.4 03/25/2018   MPG 108.28 03/25/2018   No results found for: PROLACTIN Lab Results  Component Value Date   CHOL 184 03/25/2018   TRIG 43 03/25/2018   HDL 50 03/25/2018   CHOLHDL 3.7 03/25/2018   VLDL 9 03/25/2018   LDLCALC 125 (H) 03/25/2018    Physical Findings: AIMS: Facial and Oral Movements Muscles of Facial Expression: None, normal Lips and Perioral Area: None, normal Jaw: None, normal Tongue: None, normal,Extremity Movements Upper (arms, wrists, hands, fingers): None, normal Lower (legs, knees, ankles, toes): None, normal, Trunk Movements Neck, shoulders, hips: None, normal, Overall Severity Severity of abnormal movements (highest score from questions above): None, normal Incapacitation due to abnormal movements: None, normal Patient's awareness of abnormal movements (rate only patient's report): No Awareness, Dental Status Current problems with teeth and/or dentures?: No Does patient usually wear dentures?: No  CIWA:  CIWA-Ar Total: 0 COWS:  COWS Total Score:  0  Musculoskeletal: Strength & Muscle Tone: within normal limits Gait & Station: normal Patient leans: N/A  Psychiatric Specialty Exam: Physical Exam  Nursing note and vitals reviewed.   Review of Systems  All other systems reviewed and are negative.   Blood pressure 112/66, pulse 82, temperature 98.3 F (36.8 C),  temperature source Oral, resp. rate 18, height 5\' 8"  (1.727 m), weight 58.1 kg, SpO2 100 %.Body mass index is 19.46 kg/m.  General Appearance: Casual  Eye Contact:  Good  Speech:  Clear and Coherent  Volume:  Normal  Mood:  Euthymic  Affect:  Appropriate  Thought Process:  Coherent and Goal Directed  Orientation:  Full (Time, Place, and Person)  Thought Content:  Logical  Suicidal Thoughts:  No  Homicidal Thoughts:  No  Memory:  Immediate;   Fair  Judgement:  Impaired  Insight:  Lacking  Psychomotor Activity:  Normal  Concentration:  Concentration: Fair  Recall:  [employer] of Knowledge:  Fair  Language:  Fair  Akathisia:  No      Assets:  Resilience  ADL's:  Intact  Cognition:  WNL  Sleep:  Number of Hours: 6     Treatment Plan Summary: 37 yo male admitted due to psychosis. Psychosis is well controlled. He is waiting placement to group home but there has been issues with transferring his finances.   Plan:  Schizoaffective disorder -Continue [place]. Next due on 10/16 -Continue Depakote 1000 mg qam and 750 mg qhs -Continue Haldol 5 mg qhs  dispo -Waiting on placement  Haskell Riling, MD 05/13/2018, 2:30 PM

## 2018-05-14 NOTE — Progress Notes (Signed)
Received David Davila this AM after breakfast, he denied all of the psychiatric symptoms and feels safe to be discharge home as soon as his plans are finalized.

## 2018-05-14 NOTE — Progress Notes (Signed)
Recreation Therapy Notes   Date:05/14/18  Time:9:30 am  Location:Craft room  Behavioral response:N/A  Intervention Topic:Problem Solving  Discussion/Intervention: Patient did not attend group.  Clinical Observations/Feedback:  Patient did not attend group.          Keyani Rigdon 05/14/2018 10:32 AM 

## 2018-05-14 NOTE — Plan of Care (Signed)
Patient is calm and anticipating discharge home , compliant with therapeutic regimen and socializing well with peers, no complain voiced by patient , denies any SI/HI and no signs of AVH noted at this time, sleep long hours with out any interruption, 15 minute safety rounding in progress.    Problem: Education: Goal: Knowledge of [facility] Education information/materials will improve Outcome: Progressing Goal: Emotional status will improve Outcome: Progressing Goal: Mental status will improve Outcome: Progressing Goal: Verbalization of understanding the information provided will improve Outcome: Progressing   Problem: Activity: Goal: Interest or engagement in activities will improve Outcome: Progressing Goal: Sleeping patterns will improve Outcome: Progressing   Problem: Coping: Goal: Ability to verbalize frustrations and anger appropriately will improve Outcome: Progressing Goal: Ability to demonstrate self-control will improve Outcome: Progressing   Problem: Health Behavior/Discharge Planning: Goal: Identification of resources available to assist in meeting health care needs will improve Outcome: Progressing Goal: Compliance with treatment plan for underlying cause of condition will improve Outcome: Progressing   Problem: Physical Regulation: Goal: Ability to maintain clinical measurements within normal limits will improve Outcome: Progressing   Problem: Safety: Goal: Periods of time without injury will increase Outcome: Progressing   Problem: Activity: Goal: Will verbalize the importance of balancing activity with adequate rest periods Outcome: Progressing   Problem: Education: Goal: Will be free of psychotic symptoms Outcome: Progressing Goal: Knowledge of the prescribed therapeutic regimen will improve Outcome: Progressing   Problem: Coping: Goal: Coping ability will improve Outcome: Progressing Goal: Will verbalize feelings Outcome: Progressing    Problem: Health Behavior/Discharge Planning: Goal: Compliance with prescribed medication regimen will improve Outcome: Progressing   Problem: Nutritional: Goal: Ability to achieve adequate nutritional intake will improve Outcome: Progressing   Problem: Role Relationship: Goal: Ability to communicate needs accurately will improve Outcome: Progressing Goal: Ability to interact with others will improve Outcome: Progressing   Problem: Safety: Goal: Ability to redirect hostility and anger into socially appropriate behaviors will improve Outcome: Progressing Goal: Ability to remain free from injury will improve Outcome: Progressing   Problem: Self-Care: Goal: Ability to participate in self-care as condition permits will improve Outcome: Progressing   Problem: Self-Concept: Goal: Will verbalize positive feelings about self Outcome: Progressing   Problem: Safety: Goal: Ability to remain free from injury will improve Outcome: Progressing

## 2018-05-14 NOTE — Plan of Care (Signed)
Calm and cooperative. Denying thoughts of self harm. Denying hallucinations. Compliant with treatment. Mood and hygiene improved.

## 2018-05-14 NOTE — Progress Notes (Signed)
[facility] MD Progress Note  05/14/2018 3:00 PM David Davila.  MRN:  [id number] Subjective:  Pt is doing well. He is calm and organized in thoughts. He has not been violent or agitation at all. He is waiting patiently for placement to new group home. Denies all symptoms. Denies physical complaints.   Principal Problem: Undifferentiated schizophrenia (HCC) Diagnosis:   Patient Active Problem List   Diagnosis Date Noted  . Undifferentiated schizophrenia (HCC) [F20.3] 03/26/2018    Priority: High  . TBI (traumatic brain injury) (HCC) [S06.9X9A] 07/10/2016  . Vitamin B12 deficiency [E53.8] 07/10/2016  . Noncompliance with medication regimen [Z91.14] 06/25/2016  . Cocaine use disorder, mild, abuse (HCC) [F14.10] 06/25/2016  . Cannabis use disorder, mild, abuse [F12.10] 06/25/2016  . 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 Acute psychosis (HCC)   . Antisocial personality disorder (HCC)   . Cannabis abuse    Past  . Paranoid schizophrenia (HCC)   . Schizoaffective disorder, bipolar type (HCC)    History reviewed. No pertinent surgical history. Family History: History reviewed. No pertinent family history. Family Psychiatric  History: See H&P Social History:  Social History   Substance and Sexual Activity  Alcohol Use No     Social History   Substance and Sexual Activity  Drug Use Not Currently    Social History   Socioeconomic History  . Marital status: Single    Spouse name: Not on file  . Number of children: Not on file  . Years of education: Not on file  . Highest education level: Not on file  Occupational History  . Not on file  Social Needs  . Financial resource strain: Not on file  . Food insecurity:    Worry: Not on file    Inability: Not on file  . Transportation needs:    Medical: Not on file     Non-medical: Not on file  Tobacco Use  . Smoking status: Former Smoker    Packs/day: 1.00    Types: Cigarettes  . Smokeless tobacco: Never Used  Substance and Sexual Activity  . Alcohol use: No  . Drug use: Not Currently  . Sexual activity: Not Currently  Lifestyle  . Physical activity:    Days per week: Not on file    Minutes per session: Not on file  . Stress: Not on file  Relationships  . Social connections:    Talks on phone: Not on file    Gets together: Not on file    Attends religious service: Not on file    Active member of club or organization: Not on file    Attends meetings of clubs or organizations: Not on file    Relationship status: Not on file  Other Topics Concern  . Not on file  Social History Narrative  . Not on file   Additional Social History:                         Sleep: Good  Appetite:  Good  Current Medications: Current Facility-Administered Medications  Medication Dose Route Frequency Provider Last Rate Last Dose  . acetaminophen (TYLENOL) tablet 650 mg  650 mg Oral Q6H PRN Clapacs, John T, MD      . alum & mag hydroxide-simeth (MAALOX/MYLANTA) 200-200-20 MG/5ML suspension 30 mL  30 mL Oral Q4H  PRN Clapacs, Jackquline Denmark, MD      . benztropine (COGENTIN) tablet 1 mg  1 mg Oral BID PRN Adonnis Salceda, Ileene Hutchinson, MD   1 mg at 04/08/18 2048   Or  . benztropine mesylate (COGENTIN) injection 1 mg  1 mg Intramuscular BID PRN Braxton Vantrease, Ileene Hutchinson, MD      . benztropine (COGENTIN) tablet 1 mg  1 mg Oral QHS Garet Hooton, Ileene Hutchinson, MD   1 mg at 05/13/18 2058  . divalproex (DEPAKOTE) DR tablet 1,000 mg  1,000 mg Oral BH-q7a Michole Lecuyer R, MD   1,000 mg at 05/14/18 1610   And  . divalproex (DEPAKOTE) DR tablet 750 mg  750 mg Oral QHS Ceria Suminski R, MD   750 mg at 05/13/18 2057  . haloperidol (HALDOL) tablet 5 mg  5 mg Oral Q6H PRN Moise Friday, Ileene Hutchinson, MD       Or  . haloperidol lactate (HALDOL) injection 5 mg  5 mg Intramuscular Q6H PRN Kimmi Acocella, Ileene Hutchinson, MD   5 mg at 04/01/18  0819  . haloperidol (HALDOL) tablet 5 mg  5 mg Oral QHS Oval Cavazos R, MD   5 mg at 05/13/18 2057  . hydrOXYzine (ATARAX/VISTARIL) tablet 50 mg  50 mg Oral TID PRN Audery Amel, MD   50 mg at 05/09/18 2119  . LORazepam (ATIVAN) tablet 1 mg  1 mg Oral Q6H PRN Haskell Riling, MD   1 mg at 04/16/18 2134   Or  . LORazepam (ATIVAN) injection 1 mg  1 mg Intramuscular Q6H PRN Haskell Riling, MD   1 mg at 04/01/18 0820  . magnesium hydroxide (MILK OF MAGNESIA) suspension 30 mL  30 mL Oral Daily PRN Clapacs, John T, MD      . traZODone (DESYREL) tablet 100 mg  100 mg Oral QHS PRN Clapacs, Jackquline Denmark, MD   100 mg at 05/12/18 2115    Lab Results: No results found for this or any previous visit (from the past 48 hour(s)).  Blood Alcohol level:  Lab Results  Component Value Date   ETH <10 03/25/2018   ETH <10 12/06/2017    Metabolic Disorder Labs: Lab Results  Component Value Date   HGBA1C 5.4 03/25/2018   MPG 108.28 03/25/2018   No results found for: PROLACTIN Lab Results  Component Value Date   CHOL 184 03/25/2018   TRIG 43 03/25/2018   HDL 50 03/25/2018   CHOLHDL 3.7 03/25/2018   VLDL 9 03/25/2018   LDLCALC 125 (H) 03/25/2018    Physical Findings: AIMS: Facial and Oral Movements Muscles of Facial Expression: None, normal Lips and Perioral Area: None, normal Jaw: None, normal Tongue: None, normal,Extremity Movements Upper (arms, wrists, hands, fingers): None, normal Lower (legs, knees, ankles, toes): None, normal, Trunk Movements Neck, shoulders, hips: None, normal, Overall Severity Severity of abnormal movements (highest score from questions above): None, normal Incapacitation due to abnormal movements: None, normal Patient's awareness of abnormal movements (rate only patient's report): No Awareness, Dental Status Current problems with teeth and/or dentures?: No Does patient usually wear dentures?: No  CIWA:  CIWA-Ar Total: 0 COWS:  COWS Total Score:  0  Musculoskeletal: Strength & Muscle Tone: within normal limits Gait & Station: normal Patient leans: N/A  Psychiatric Specialty Exam: Physical Exam  Nursing note and vitals reviewed.   Review of Systems  All other systems reviewed and are negative.   Blood pressure 112/66, pulse 82, temperature 98.3 F (36.8 C), temperature source Oral, resp.  rate 18, height 5\' 8"  (1.727 m), weight 58.1 kg, SpO2 100 %.Body mass index is 19.46 kg/m.  General Appearance: Casual  Eye Contact:  Fair  Speech:  Clear and Coherent  Volume:  Normal  Mood:  Euthymic  Affect:  Appropriate  Thought Process:  Coherent and Goal Directed  Orientation:  Full (Time, Place, and Person)  Thought Content:  Logical  Suicidal Thoughts:  No  Homicidal Thoughts:  No  Memory:  Immediate;   Fair  Judgement:  Impaired  Insight:  Lacking  Psychomotor Activity:  Normal  Concentration:  Concentration: Fair  Recall:  [employer] of Knowledge:  Fair  Language:  Fair  Akathisia:  No      Assets:  Resilience  ADL's:  Intact  Cognition:  WNL  Sleep:  Number of Hours: 5.15     Treatment Plan Summary: 37 yo male admitted due to psychosis. Psychosis well controlled. Waiting placement at new group home.  Plan:  Schizoaffective disorder -Continue Gean [place]. Next dose due on 10/16. Will check CBC -Continue Depakote 1000 mg qam and 750 mg qhs -Continue Haldol 5 mg qhs  Dispo -waiting on placement Haskell Riling, MD 05/14/2018, 3:00 PM

## 2018-05-14 NOTE — B[facility] Group Notes (Signed)
  LCSW Group Therapy Note  05/14/2018 1:00pm  Type of Therapy/Topic:  Group Therapy:  Balance in Life  Participation Level:  Active  Description of Group:    This group will address the concept of balance and how it feels and looks when one is unbalanced. Patients will be encouraged to process areas in their lives that are out of balance and identify reasons for remaining unbalanced. Facilitators will guide patients in utilizing problem-solving interventions to address and correct the stressor making their life unbalanced. Understanding and applying boundaries will be explored and addressed for obtaining and maintaining a balanced life. Patients will be encouraged to explore ways to assertively make their unbalanced needs known to significant others in their lives, using other group members and facilitator for support and feedback.  Therapeutic Goals: 1. Patient will identify two or more emotions or situations they have that consume much of in their lives. 2. Patient will identify signs/triggers that life has become out of balance:  3. Patient will identify two ways to set boundaries in order to achieve balance in their lives:  4. Patient will demonstrate ability to communicate their needs through discussion and/or role plays  Summary of Patient Progress:  Patient answered all questions and appeared to be OK with sharing in group.    Therapeutic Modalities:   Cognitive Behavioral Therapy Solution-Focused Therapy Assertiveness Training  Cheron Schaumann, [place] 05/14/2018 2:55 PM

## 2018-05-14 NOTE — [facility] Group Notes (Signed)
[facility] Group Notes:  (Nursing/MHT/Case Management/Adjunct)  Date:  05/14/2018  Time:  2:12 AM  Type of Therapy:  Group Therapy  Participation Level:  Active  Participation Quality:  Appropriate  Affect:  Appropriate  Cognitive:  Alert  Insight:  Good    Engagement in Group:  Supportive  Modes of Intervention:  Support  Summary of Progress/Problems:  Mayra Neer 05/14/2018, 2:12 AM

## 2018-05-15 MED ORDER — TUBERCULIN PPD 5 UNIT/0.1ML ID SOLN
5.0000 [IU] | Freq: Once | INTRADERMAL | Status: AC
  Administered 2018-05-15: 5 [IU] via INTRADERMAL
  Filled 2018-05-15: qty 0.1

## 2018-05-15 NOTE — Plan of Care (Signed)
Patient alert and oriented X 4, denies SI, HI and AVH. Patient is very pleasant no signs of responding to internal stimuli. Patient  Interacts appropriately with staff and fellow peers on the unit.  Patient denies pain 0/10. No self injurious behaviors noted. Safety check Q 15 minutes will continue.

## 2018-05-15 NOTE — Plan of Care (Signed)
Calm and cooperative. Compliant with treatment.  

## 2018-05-15 NOTE — Progress Notes (Signed)
Patient is currently visible in the milieu, walking frequently in the hallway with a peer. Alert and oriented. Pleasant and cooperative. Was mostly in his room but also came out for group and medications. Denying thoughts of self harm. Denying hallucinations. Had a snack and received medications. Has no concerns so far. Staff continue to provide support and encouragements. Safety precautions maintained.

## 2018-05-15 NOTE — Progress Notes (Signed)
[facility] MD Progress Note  05/15/2018 1:46 PM David Davila.  MRN:  [id number] Subjective:  Pt states that he is doing well. He will go to his new group home on Monday. He is very excited for this. He is feeling okay. He denies having any physical complaints. He refused blood work. He states that he was told we would not have to do anymore blood work. He is sleeping fine. Eating well. He has been calm and pleasant. He is social with another peer and they are walking and talking the halls together today.   Principal Problem: Undifferentiated schizophrenia (HCC) Diagnosis:   Patient Active Problem List   Diagnosis Date Noted  . Undifferentiated schizophrenia (HCC) [F20.3] 03/26/2018    Priority: High  . TBI (traumatic brain injury) (HCC) [S06.9X9A] 07/10/2016  . Vitamin B12 deficiency [E53.8] 07/10/2016  . Noncompliance with medication regimen [Z91.14] 06/25/2016  . Cocaine use disorder, mild, abuse (HCC) [F14.10] 06/25/2016  . Cannabis use disorder, mild, abuse [F12.10] 06/25/2016  . Paranoid schizophrenia (HCC) [F20.0] 06/24/2016   Total Time spent with patient: 15 minutes  Past Psychiatric History: See h&P  Past Medical History:  Past Medical History:  Diagnosis Date  . Acute psychosis (HCC)   . Antisocial personality disorder (HCC)   . Cannabis abuse    Past  . Paranoid schizophrenia (HCC)   . Schizoaffective disorder, bipolar type (HCC)    History reviewed. No pertinent surgical history. Family History: History reviewed. No pertinent family history. Family Psychiatric  History: See H&P Social History:  Social History   Substance and Sexual Activity  Alcohol Use No     Social History   Substance and Sexual Activity  Drug Use Not Currently    Social History   Socioeconomic History  . Marital status: Single    Spouse name: Not on file  . Number of children: Not on file  . Years of education: Not on file  . Highest education level: Not on file  Occupational  History  . Not on file  Social Needs  . Financial resource strain: Not on file  . Food insecurity:    Worry: Not on file    Inability: Not on file  . Transportation needs:    Medical: Not on file    Non-medical: Not on file  Tobacco Use  . Smoking status: Former Smoker    Packs/day: 1.00    Types: Cigarettes  . Smokeless tobacco: Never Used  Substance and Sexual Activity  . Alcohol use: No  . Drug use: Not Currently  . Sexual activity: Not Currently  Lifestyle  . Physical activity:    Days per week: Not on file    Minutes per session: Not on file  . Stress: Not on file  Relationships  . Social connections:    Talks on phone: Not on file    Gets together: Not on file    Attends religious service: Not on file    Active member of club or organization: Not on file    Attends meetings of clubs or organizations: Not on file    Relationship status: Not on file  Other Topics Concern  . Not on file  Social History Narrative  . Not on file   Additional Social History:                         Sleep: Good  Appetite:  Good  Current Medications: Current Facility-Administered Medications  Medication Dose Route  Frequency Provider Last Rate Last Dose  . acetaminophen (TYLENOL) tablet 650 mg  650 mg Oral Q6H PRN Clapacs, John T, MD      . alum & mag hydroxide-simeth (MAALOX/MYLANTA) 200-200-20 MG/5ML suspension 30 mL  30 mL Oral Q4H PRN Clapacs, John T, MD      . benztropine (COGENTIN) tablet 1 mg  1 mg Oral BID PRN McNew, Ileene Hutchinson, MD   1 mg at 04/08/18 2048   Or  . benztropine mesylate (COGENTIN) injection 1 mg  1 mg Intramuscular BID PRN McNew, Ileene Hutchinson, MD      . benztropine (COGENTIN) tablet 1 mg  1 mg Oral QHS McNew, Ileene Hutchinson, MD   1 mg at 05/14/18 2124  . divalproex (DEPAKOTE) DR tablet 1,000 mg  1,000 mg Oral BH-q7a McNew, Holly R, MD   1,000 mg at 05/15/18 1214   And  . divalproex (DEPAKOTE) DR tablet 750 mg  750 mg Oral QHS McNew, Holly R, MD   750 mg at 05/14/18  2123  . haloperidol (HALDOL) tablet 5 mg  5 mg Oral Q6H PRN McNew, Ileene Hutchinson, MD       Or  . haloperidol lactate (HALDOL) injection 5 mg  5 mg Intramuscular Q6H PRN McNew, Ileene Hutchinson, MD   5 mg at 04/01/18 0819  . haloperidol (HALDOL) tablet 5 mg  5 mg Oral QHS McNew, Holly R, MD   5 mg at 05/14/18 2124  . hydrOXYzine (ATARAX/VISTARIL) tablet 50 mg  50 mg Oral TID PRN Audery Amel, MD   50 mg at 05/09/18 2119  . LORazepam (ATIVAN) tablet 1 mg  1 mg Oral Q6H PRN Haskell Riling, MD   1 mg at 04/16/18 2134   Or  . LORazepam (ATIVAN) injection 1 mg  1 mg Intramuscular Q6H PRN Haskell Riling, MD   1 mg at 04/01/18 0820  . magnesium hydroxide (MILK OF MAGNESIA) suspension 30 mL  30 mL Oral Daily PRN Clapacs, John T, MD      . traZODone (DESYREL) tablet 100 mg  100 mg Oral QHS PRN Clapacs, Jackquline Denmark, MD   100 mg at 05/12/18 2115  . tuberculin injection 5 Units  5 Units Intradermal Once McNew, Ileene Hutchinson, MD        Lab Results: No results found for this or any previous visit (from the past 48 hour(s)).  Blood Alcohol level:  Lab Results  Component Value Date   ETH <10 03/25/2018   ETH <10 12/06/2017    Metabolic Disorder Labs: Lab Results  Component Value Date   HGBA1C 5.4 03/25/2018   MPG 108.28 03/25/2018   No results found for: PROLACTIN Lab Results  Component Value Date   CHOL 184 03/25/2018   TRIG 43 03/25/2018   HDL 50 03/25/2018   CHOLHDL 3.7 03/25/2018   VLDL 9 03/25/2018   LDLCALC 125 (H) 03/25/2018    Physical Findings: AIMS: Facial and Oral Movements Muscles of Facial Expression: None, normal Lips and Perioral Area: None, normal Jaw: None, normal Tongue: None, normal,Extremity Movements Upper (arms, wrists, hands, fingers): None, normal Lower (legs, knees, ankles, toes): None, normal, Trunk Movements Neck, shoulders, hips: None, normal, Overall Severity Severity of abnormal movements (highest score from questions above): None, normal Incapacitation due to abnormal  movements: None, normal Patient's awareness of abnormal movements (rate only patient's report): No Awareness, Dental Status Current problems with teeth and/or dentures?: No Does patient usually wear dentures?: No  CIWA:  CIWA-Ar Total: 0  COWS:  COWS Total Score: 0  Musculoskeletal: Strength & Muscle Tone: within normal limits Gait & Station: normal Patient leans: N/A  Psychiatric Specialty Exam: Physical Exam  Nursing note and vitals reviewed.   Review of Systems  All other systems reviewed and are negative.   Blood pressure (!) 84/45, pulse (!) 56, temperature 97.9 F (36.6 C), temperature source Oral, resp. rate 16, height 5\' 8"  (1.727 m), weight 58.1 kg, SpO2 98 %.Body mass index is 19.46 kg/m.  General Appearance: Casual  Eye Contact:  Good  Speech:  Clear and Coherent  Volume:  Normal  Mood:  Euthymic  Affect:  Appropriate  Thought Process:  Coherent and Goal Directed  Orientation:  Full (Time, Place, and Person)  Thought Content:  Logical  Suicidal Thoughts:  No  Homicidal Thoughts:  No  Memory:  Immediate;   Fair  Judgement:  Impaired  Insight:  Lacking  Psychomotor Activity:  Normal  Concentration:  Concentration: Fair  Recall:  [employer] of Knowledge:  Fair  Language:  Fair  Akathisia:  No      Assets:  Resilience  ADL's:  Intact  Cognition:  WNL  Sleep:  Number of Hours: 6.5     Treatment Plan Summary: 37 yo male admitted due to psychosis which is now well controlled. He will go to new group home on Monday.  Plan:  Schizoaffective disorder -Continue [place]. Next dose on 10/16. He refused CBC -Continue Depakote 1000 mg qam and 750 mg qhs -Continue Haldol 5 mg qhs  Dispo -he will go to new group home on Monday  Haskell Riling, MD 05/15/2018, 1:46 PM

## 2018-05-15 NOTE — [facility] Group Notes (Signed)
  05/15/2018 1:00pm  Type of Therapy and Topic:  Group Therapy:  Feelings around Relapse and Recovery  Participation Level:  Did Not Attend   Description of Group:    Patients in this group will discuss emotions they experience before and after a relapse. They will process how experiencing these feelings, or avoidance of experiencing them, relates to having a relapse. Facilitator will guide patients to explore emotions they have related to recovery. Patients will be encouraged to process which emotions are more powerful. They will be guided to discuss the emotional reaction significant others in their lives may have to their relapse or recovery. Patients will be assisted in exploring ways to respond to the emotions of others without this contributing to a relapse.  Therapeutic Goals: Patient will identify two or more emotions that lead to a relapse for them Patient will identify two emotions that result when they relapse Patient will identify two emotions related to recovery Patient will demonstrate ability to communicate their needs through discussion and/or role plays

## 2018-05-15 NOTE — Progress Notes (Signed)
Recreation Therapy Notes  Date: 05/15/2018  Time: 9:30 am   Location: Craft room   Behavioral response: N/A   Intervention Topic: Creative Expressions  Discussion/Intervention: Patient did not attend group.   Clinical Observations/Feedback:  Patient did not attend group.   Wells Gerdeman LRT/CTRS        David Davila 05/15/2018 11:29 AM 

## 2018-05-15 NOTE — BHH Counselor (Signed)
CSW spoke with the patients guardian David Davila and she reports that the patients financial issues have been handled. She reports that the group home will be coming to pick him up on Monday.   David Davila, MSW, Theresia Majors, Bridget Hartshorn Clinical Social Worker 05/15/2018 9:40 AM

## 2018-05-15 NOTE — [facility] Group Notes (Signed)
[facility] Group Notes:  (Nursing/MHT/Case Management/Adjunct)  Date:  05/15/2018  Time:  9:38 AM  Type of Therapy:  Psychoeducational Skills  Participation Level:  Did Not Attend  Foy Guadalajara 05/15/2018, 9:38 AM

## 2018-05-15 NOTE — Progress Notes (Signed)
TB injection given today 05/15/18 at 17:16pm on right forearm. Marked by red marker.

## 2018-05-16 LAB — CBC WITH DIFFERENTIAL/PLATELET
BASOS PCT: 1 %
Basophils Absolute: 0 10*3/uL (ref 0–0.1)
EOS ABS: 0.2 10*3/uL (ref 0–0.7)
EOS PCT: 5 %
HCT: 38.6 % — ABNORMAL LOW (ref 40.0–52.0)
Hemoglobin: 12.8 g/dL — ABNORMAL LOW (ref 13.0–18.0)
Lymphocytes Relative: 40 %
Lymphs Abs: 1.8 10*3/uL (ref 1.0–3.6)
MCH: 30.4 pg (ref 26.0–34.0)
MCHC: 33.2 g/dL (ref 32.0–36.0)
MCV: 91.8 fL (ref 80.0–100.0)
MONO ABS: 1 10*3/uL (ref 0.2–1.0)
MONOS PCT: 22 %
Neutro Abs: 1.5 10*3/uL (ref 1.4–6.5)
Neutrophils Relative %: 34 %
PLATELETS: 147 10*3/uL — AB (ref 150–440)
RBC: 4.21 MIL/uL — ABNORMAL LOW (ref 4.40–5.90)
RDW: 14.2 % (ref 11.5–14.5)
WBC: 4.5 10*3/uL (ref 3.8–10.6)

## 2018-05-16 NOTE — B[facility] Group Notes (Signed)
LCSW Group Therapy Note   05/16/2018 1:15pm   Type of Therapy and Topic:  Group Therapy:  Trust and Honesty  Participation Level:  Did Not Attend  Description of Group:    In this group patients will be asked to explore the value of being honest.  Patients will be guided to discuss their thoughts, feelings, and behaviors related to honesty and trusting in others. Patients will process together how trust and honesty relate to forming relationships with peers, family members, and self. Each patient will be challenged to identify and express feelings of being vulnerable. Patients will discuss reasons why people are dishonest and identify alternative outcomes if one was truthful (to self or others). This group will be process-oriented, with patients participating in exploration of their own experiences, giving and receiving support, and processing challenge from other group members.   Therapeutic Goals: 1. Patient will identify why honesty is important to relationships and how honesty overall affects relationships.  2. Patient will identify a situation where they lied or were lied too and the  feelings, thought process, and behaviors surrounding the situation 3. Patient will identify the meaning of being vulnerable, how that feels, and how that correlates to being honest with self and others. 4. Patient will identify situations where they could have told the truth, but instead lied and explain reasons of dishonesty.   Summary of Patient Progress: Pt was invited to attend group but chose not to attend. CSW will continue to encourage pt to attend group throughout their admission.     Therapeutic Modalities:   Cognitive Behavioral Therapy Solution Focused Therapy Motivational Interviewing Brief Therapy  Makinzee Durley  CUEBAS-COLON, LCSW 05/16/2018 11:59 AM

## 2018-05-16 NOTE — Plan of Care (Signed)
Patient in bed upon this writer's arrival to the unit. Up in the milieu for meals and medication, otherwise, patient in bed resting with his eyes closed. Patient has expressed his excitement for being discharged this coming Monday. Compliant with treatment plan. Milieu remains safe with q 15 minute safety checks.

## 2018-05-16 NOTE — Progress Notes (Signed)
[facility] MD Progress Note  05/16/2018 4:13 PM David Davila.  MRN:  [id number] Subjective:  Pt see and chart reviewed. David Davila is awake, alert, but doesn'Davila want to talk much. David Davila said that David Davila is doing well, has no complaints.  RN ; David Davila has been keep it to himself, not interact much.  David Davila wants to go to a GH.   Principal Problem: Undifferentiated schizophrenia (HCC) Diagnosis:   Patient Active Problem List   Diagnosis Date Noted  . Undifferentiated schizophrenia (HCC) [F20.3] 03/26/2018  . TBI (traumatic brain injury) (HCC) [S06.9X9A] 07/10/2016  . Vitamin B12 deficiency [E53.8] 07/10/2016  . Noncompliance with medication regimen [Z91.14] 06/25/2016  . Cocaine use disorder, mild, abuse (HCC) [F14.10] 06/25/2016  . Cannabis use disorder, mild, abuse [F12.10] 06/25/2016  . Paranoid schizophrenia (HCC) [F20.0] 06/24/2016   Total Time spent with patient: 15 minutes  Past Psychiatric History: See h&P  Past Medical History:  Past Medical History:  Diagnosis Date  . Acute psychosis (HCC)   . Antisocial personality disorder (HCC)   . Cannabis abuse    Past  . Paranoid schizophrenia (HCC)   . Schizoaffective disorder, bipolar type (HCC)    History reviewed. No pertinent surgical history. Family History: History reviewed. No pertinent family history. Family Psychiatric  History: See H&P Social History:  Social History   Substance and Sexual Activity  Alcohol Use No     Social History   Substance and Sexual Activity  Drug Use Not Currently    Social History   Socioeconomic History  . Marital status: Single    Spouse name: Not on file  . Number of children: Not on file  . Years of education: Not on file  . Highest education level: Not on file  Occupational History  . Not on file  Social Needs  . Financial resource strain: Not on file  . Food insecurity:    Worry: Not on file    Inability: Not on file  . Transportation needs:    Medical: Not on file    Non-medical: Not  on file  Tobacco Use  . Smoking status: Former Smoker    Packs/day: 1.00    Types: Cigarettes  . Smokeless tobacco: Never Used  Substance and Sexual Activity  . Alcohol use: No  . Drug use: Not Currently  . Sexual activity: Not Currently  Lifestyle  . Physical activity:    Days per week: Not on file    Minutes per session: Not on file  . Stress: Not on file  Relationships  . Social connections:    Talks on phone: Not on file    Gets together: Not on file    Attends religious service: Not on file    Active member of club or organization: Not on file    Attends meetings of clubs or organizations: Not on file    Relationship status: Not on file  Other Topics Concern  . Not on file  Social History Narrative  . Not on file   Additional Social History:    Sleep: Good  Appetite:  Good  Current Medications: Current Facility-Administered Medications  Medication Dose Route Frequency Provider Last Rate Last Dose  . acetaminophen (TYLENOL) tablet 650 mg  650 mg Oral Q6H PRN David Davila, David T, MD      . alum & mag hydroxide-simeth (MAALOX/MYLANTA) 200-200-20 MG/5ML suspension 30 mL  30 mL Oral Q4H PRN David Davila, David T, MD      . benztropine (COGENTIN) tablet 1 mg  1 mg Oral BID PRN David Riling, MD   1 mg at 04/08/18 2048   Or  . benztropine mesylate (COGENTIN) injection 1 mg  1 mg Intramuscular BID PRN David Davila, David Hutchinson, MD      . benztropine (COGENTIN) tablet 1 mg  1 mg Oral QHS David Davila, David Hutchinson, MD   1 mg at 05/15/18 2111  . divalproex (DEPAKOTE) DR tablet 1,000 mg  1,000 mg Oral BH-q7a David Davila, David R, MD   1,000 mg at 05/16/18 1154   And  . divalproex (DEPAKOTE) DR tablet 750 mg  750 mg Oral QHS David Davila, David R, MD   750 mg at 05/15/18 2111  . haloperidol (HALDOL) tablet 5 mg  5 mg Oral Q6H PRN David Davila, David Hutchinson, MD       Or  . haloperidol lactate (HALDOL) injection 5 mg  5 mg Intramuscular Q6H PRN David Davila, David Hutchinson, MD   5 mg at 04/01/18 0819  . haloperidol (HALDOL) tablet 5 mg  5 mg Oral  QHS David Davila, David R, MD   5 mg at 05/15/18 2111  . hydrOXYzine (ATARAX/VISTARIL) tablet 50 mg  50 mg Oral TID PRN David Amel, MD   50 mg at 05/09/18 2119  . LORazepam (ATIVAN) tablet 1 mg  1 mg Oral Q6H PRN David Riling, MD   1 mg at 04/16/18 2134   Or  . LORazepam (ATIVAN) injection 1 mg  1 mg Intramuscular Q6H PRN David Riling, MD   1 mg at 04/01/18 0820  . magnesium hydroxide (MILK OF MAGNESIA) suspension 30 mL  30 mL Oral Daily PRN David Davila, David T, MD      . traZODone (DESYREL) tablet 100 mg  100 mg Oral QHS PRN David Davila, David Denmark, MD   100 mg at 05/12/18 2115  . tuberculin injection 5 Units  5 Units Intradermal Once David Riling, MD   5 Units at 05/15/18 1711    Lab Results:  Results for orders placed or performed during the hospital encounter of 03/26/18 (from the past 48 hour(s))  CBC with Differential/Platelet     Status: Abnormal   Collection Time: 05/16/18 11:18 AM  Result Value Ref Range   WBC 4.5 3.8 - 10.6 K/uL   RBC 4.21 (L) 4.40 - 5.90 MIL/uL   Hemoglobin 12.8 (L) 13.0 - 18.0 g/dL   HCT 96.0 (L) 45.4 - 09.8 %   MCV 91.8 80.0 - 100.0 fL   MCH 30.4 26.0 - 34.0 pg   MCHC 33.2 32.0 - 36.0 g/dL   RDW 11.9 14.7 - 82.9 %   Platelets 147 (L) 150 - 440 K/uL   Neutrophils Relative % 34 %   Neutro Abs 1.5 1.4 - 6.5 K/uL   Lymphocytes Relative 40 %   Lymphs Abs 1.8 1.0 - 3.6 K/uL   Monocytes Relative 22 %   Monocytes Absolute 1.0 0.2 - 1.0 K/uL   Eosinophils Relative 5 %   Eosinophils Absolute 0.2 0 - 0.7 K/uL   Basophils Relative 1 %   Basophils Absolute 0.0 0 - 0.1 K/uL    Comment: Performed at [facility], [address] [place], [place] [address]    Blood Alcohol level:  Lab Results  Component Value Date   [facility] <10 03/25/2018   ETH <10 12/06/2017    Metabolic Disorder Labs: Lab Results  Component Value Date   HGBA1C 5.4 03/25/2018   MPG 108.28 03/25/2018   No results found for: PROLACTIN Lab Results  Component Value Date   CHOL 184  03/25/2018   TRIG 43 03/25/2018   HDL 50 03/25/2018   CHOLHDL 3.7 03/25/2018   VLDL 9 03/25/2018   LDLCALC 125 (H) 03/25/2018    Physical Findings: AIMS: Facial and Oral Movements Muscles of Facial Expression: None, normal Lips and Perioral Area: None, normal Jaw: None, normal Tongue: None, normal,Extremity Movements Upper (arms, wrists, hands, fingers): None, normal Lower (legs, knees, ankles, toes): None, normal, Trunk Movements Neck, shoulders, hips: None, normal, Overall Severity Severity of abnormal movements (highest score from questions above): None, normal Incapacitation due to abnormal movements: None, normal Patient's awareness of abnormal movements (rate only patient's report): No Awareness, Dental Status Current problems with teeth and/or dentures?: No Does patient usually wear dentures?: No  CIWA:  CIWA-Ar Total: 0 COWS:  COWS Total Score: 0  Musculoskeletal: Strength & Muscle Tone: within normal limits Gait & Station: normal Patient leans: N/A  Psychiatric Specialty Exam: Physical Exam  Nursing note and vitals reviewed.   Review of Systems  All other systems reviewed and are negative.   Blood pressure (!) 98/54, pulse 60, temperature 98.4 F (36.9 C), temperature source Oral, resp. rate 18, height 5\' 8"  (1.727 m), weight 58.1 kg, SpO2 99 %.Body mass index is 19.46 kg/m.  General Appearance: Casual  Eye Contact:  Good  Speech:  Clear and Coherent  Volume:  Normal  Mood:  Euthymic  Affect:  Appropriate  Thought Process:  Coherent and Goal Directed  Orientation:  Full (Time, Place, and Person)  Thought Content:  Logical  Suicidal Thoughts:  No  Homicidal Thoughts:  No  Memory:  Immediate;   Fair  Judgement:  Impaired  Insight:  Lacking  Psychomotor Activity:  Normal  Concentration:  Concentration: Fair  Recall:  [employer] of Knowledge:  Fair  Language:  Fair  Akathisia:  No      Assets:  Resilience  ADL's:  Intact  Cognition:  WNL  Sleep:   Number of Hours: 7.15     Treatment Plan Summary: 37 yo male admitted due to psychosis which is now well controlled. Ashantia Amaral will go to new group home on Monday.  Plan:  Schizoaffective disorder -Continue [place]. Next dose on 10/16. Alexica Schlossberg refused CBC -Continue Depakote 1000 mg qam and 750 mg qhs -Continue Haldol 5 mg qhs  Dispo -Lillias Difrancesco will go to new group home on Monday  Ludwin Flahive, MD 05/16/2018, 4:13 PM

## 2018-05-17 NOTE — Progress Notes (Addendum)
PPD in R) FA read. Results: 2 mm induration, minimal redness and no warmth to area, possible slight reaction. MD made aware of the results.

## 2018-05-17 NOTE — Progress Notes (Signed)
[facility] MD Progress Note  05/17/2018 2:07 PM David Davila.  MRN:  [id number] Subjective:  Pt see and chart reviewed. David Davila continues to do well on the unit.  Cooperative, and calm, but not too interactive with others.  David Davila tolerates meds without complaint.    RN ; David Davila has been keep it to himself, not interact much.  David Davila wants to go to a GH.   Principal Problem: Undifferentiated schizophrenia (HCC) Diagnosis:   Patient Active Problem List   Diagnosis Date Noted  . Undifferentiated schizophrenia (HCC) [F20.3] 03/26/2018  . TBI (traumatic brain injury) (HCC) [S06.9X9A] 07/10/2016  . Vitamin B12 deficiency [E53.8] 07/10/2016  . Noncompliance with medication regimen [Z91.14] 06/25/2016  . Cocaine use disorder, mild, abuse (HCC) [F14.10] 06/25/2016  . Cannabis use disorder, mild, abuse [F12.10] 06/25/2016  . Paranoid schizophrenia (HCC) [F20.0] 06/24/2016   Total Time spent with patient: 15 minutes  Past Psychiatric History: See h&P  Past Medical History:  Past Medical History:  Diagnosis Date  . Acute psychosis (HCC)   . Antisocial personality disorder (HCC)   . Cannabis abuse    Past  . Paranoid schizophrenia (HCC)   . Schizoaffective disorder, bipolar type (HCC)    History reviewed. No pertinent surgical history. Family History: History reviewed. No pertinent family history. Family Psychiatric  History: See H&P Social History:  Social History   Substance and Sexual Activity  Alcohol Use No     Social History   Substance and Sexual Activity  Drug Use Not Currently    Social History   Socioeconomic History  . Marital status: Single    Spouse name: Not on file  . Number of children: Not on file  . Years of education: Not on file  . Highest education level: Not on file  Occupational History  . Not on file  Social Needs  . Financial resource strain: Not on file  . Food insecurity:    Worry: Not on file    Inability: Not on file  . Transportation needs:     Medical: Not on file    Non-medical: Not on file  Tobacco Use  . Smoking status: Former Smoker    Packs/day: 1.00    Types: Cigarettes  . Smokeless tobacco: Never Used  Substance and Sexual Activity  . Alcohol use: No  . Drug use: Not Currently  . Sexual activity: Not Currently  Lifestyle  . Physical activity:    Days per week: Not on file    Minutes per session: Not on file  . Stress: Not on file  Relationships  . Social connections:    Talks on phone: Not on file    Gets together: Not on file    Attends religious service: Not on file    Active member of club or organization: Not on file    Attends meetings of clubs or organizations: Not on file    Relationship status: Not on file  Other Topics Concern  . Not on file  Social History Narrative  . Not on file   Additional Social History:    Sleep: Good  Appetite:  Good  Current Medications: Current Facility-Administered Medications  Medication Dose Route Frequency Provider Last Rate Last Dose  . acetaminophen (TYLENOL) tablet 650 mg  650 mg Oral Q6H PRN Clapacs, John T, MD      . alum & mag hydroxide-simeth (MAALOX/MYLANTA) 200-200-20 MG/5ML suspension 30 mL  30 mL Oral Q4H PRN Clapacs, Jackquline Denmark, MD      .  benztropine (COGENTIN) tablet 1 mg  1 mg Oral BID PRN McNew, Ileene Hutchinson, MD   1 mg at 04/08/18 2048   Or  . benztropine mesylate (COGENTIN) injection 1 mg  1 mg Intramuscular BID PRN McNew, Ileene Hutchinson, MD      . benztropine (COGENTIN) tablet 1 mg  1 mg Oral QHS McNew, Ileene Hutchinson, MD   1 mg at 05/16/18 2058  . divalproex (DEPAKOTE) DR tablet 1,000 mg  1,000 mg Oral BH-q7a McNew, Holly R, MD   1,000 mg at 05/17/18 0747   And  . divalproex (DEPAKOTE) DR tablet 750 mg  750 mg Oral QHS McNew, Holly R, MD   750 mg at 05/16/18 2101  . haloperidol (HALDOL) tablet 5 mg  5 mg Oral Q6H PRN McNew, Ileene Hutchinson, MD       Or  . haloperidol lactate (HALDOL) injection 5 mg  5 mg Intramuscular Q6H PRN McNew, Ileene Hutchinson, MD   5 mg at 04/01/18 0819  .  haloperidol (HALDOL) tablet 5 mg  5 mg Oral QHS McNew, Ileene Hutchinson, MD   5 mg at 05/16/18 2058  . hydrOXYzine (ATARAX/VISTARIL) tablet 50 mg  50 mg Oral TID PRN Clapacs, Jackquline Denmark, MD   50 mg at 05/09/18 2119  . LORazepam (ATIVAN) tablet 1 mg  1 mg Oral Q6H PRN Haskell Riling, MD   1 mg at 04/16/18 2134   Or  . LORazepam (ATIVAN) injection 1 mg  1 mg Intramuscular Q6H PRN Haskell Riling, MD   1 mg at 04/01/18 0820  . magnesium hydroxide (MILK OF MAGNESIA) suspension 30 mL  30 mL Oral Daily PRN Clapacs, John T, MD      . traZODone (DESYREL) tablet 100 mg  100 mg Oral QHS PRN Clapacs, Jackquline Denmark, MD   100 mg at 05/16/18 2058  . tuberculin injection 5 Units  5 Units Intradermal Once Haskell Riling, MD   5 Units at 05/15/18 1711    Lab Results:  Results for orders placed or performed during the hospital encounter of 03/26/18 (from the past 48 hour(s))  CBC with Differential/Platelet     Status: Abnormal   Collection Time: 05/16/18 11:18 AM  Result Value Ref Range   WBC 4.5 3.8 - 10.6 K/uL   RBC 4.21 (L) 4.40 - 5.90 MIL/uL   Hemoglobin 12.8 (L) 13.0 - 18.0 g/dL   HCT 16.1 (L) 09.6 - 04.5 %   MCV 91.8 80.0 - 100.0 fL   MCH 30.4 26.0 - 34.0 pg   MCHC 33.2 32.0 - 36.0 g/dL   RDW 40.9 81.1 - 91.4 %   Platelets 147 (L) 150 - 440 K/uL   Neutrophils Relative % 34 %   Neutro Abs 1.5 1.4 - 6.5 K/uL   Lymphocytes Relative 40 %   Lymphs Abs 1.8 1.0 - 3.6 K/uL   Monocytes Relative 22 %   Monocytes Absolute 1.0 0.2 - 1.0 K/uL   Eosinophils Relative 5 %   Eosinophils Absolute 0.2 0 - 0.7 K/uL   Basophils Relative 1 %   Basophils Absolute 0.0 0 - 0.1 K/uL    Comment: Performed at [facility], [address] [place], [place] [address]    Blood Alcohol level:  Lab Results  Component Value Date   [facility] <10 03/25/2018   ETH <10 12/06/2017    Metabolic Disorder Labs: Lab Results  Component Value Date   HGBA1C 5.4 03/25/2018   MPG 108.28 03/25/2018   No  results found for: PROLACTIN Lab  Results  Component Value Date   CHOL 184 03/25/2018   TRIG 43 03/25/2018   HDL 50 03/25/2018   CHOLHDL 3.7 03/25/2018   VLDL 9 03/25/2018   LDLCALC 125 (H) 03/25/2018    Physical Findings: AIMS: Facial and Oral Movements Muscles of Facial Expression: None, normal Lips and Perioral Area: None, normal Jaw: None, normal Tongue: None, normal,Extremity Movements Upper (arms, wrists, hands, fingers): None, normal Lower (legs, knees, ankles, toes): None, normal, Trunk Movements Neck, shoulders, hips: None, normal, Overall Severity Severity of abnormal movements (highest score from questions above): None, normal Incapacitation due to abnormal movements: None, normal Patient's awareness of abnormal movements (rate only patient's report): No Awareness, Dental Status Current problems with teeth and/or dentures?: No Does patient usually wear dentures?: No  CIWA:  CIWA-Ar Total: 0 COWS:  COWS Total Score: 0  Musculoskeletal: Strength & Muscle Tone: within normal limits Gait & Station: normal Patient leans: N/A  Psychiatric Specialty Exam: Physical Exam  Nursing note and vitals reviewed.   Review of Systems  All other systems reviewed and are negative.   Blood pressure 104/63, pulse 69, temperature 98.4 F (36.9 C), temperature source Oral, resp. rate 16, height 5\' 8"  (1.727 m), weight 58.1 kg, SpO2 100 %.Body mass index is 19.46 kg/m.  General Appearance: Casual  Eye Contact:  Good  Speech:  Clear and Coherent  Volume:  Normal  Mood:  Euthymic  Affect:  Appropriate  Thought Process:  Coherent and Goal Directed  Orientation:  Full (Time, Place, and Person)  Thought Content:  Logical  Suicidal Thoughts:  No  Homicidal Thoughts:  No  Memory:  Immediate;   Fair  Judgement:  Impaired  Insight:  Lacking  Psychomotor Activity:  Normal  Concentration:  Concentration: Fair  Recall:  [employer] of Knowledge:  Fair  Language:  Fair  Akathisia:  No      Assets:  Resilience   ADL's:  Intact  Cognition:  WNL  Sleep:  Number of Hours: 5     Treatment Plan Summary: 37 yo male admitted due to psychosis which is now well controlled. Devlin Brink will go to new group home on Monday.  Plan:  Schizoaffective disorder -Continue [place]. Next dose on 10/16. Rochanda Harpham did let blood draw yesterday and CBC and Base chem are WNL.  -Continue Depakote 1000 mg qam and 750 mg qhs, VPA was 78 on 04/28/15  -Continue Haldol 5 mg qhs  Dispo -Caton Popowski will go to new group home on Monday  Cruz Bong, MD 05/17/2018, 2:07 PM

## 2018-05-17 NOTE — Progress Notes (Signed)
Patient ID: David Schwanke., male   DOB: November 14, 1980, 37 y.o.   MRN: [id number] PER STATE REGULATIONS 482.30  THIS CHART WAS REVIEWED FOR MEDICAL NECESSITY WITH RESPECT TO THE PATIENT'S ADMISSION/DURATION OF STAY.  NEXT REVIEW DATE:05/21/18  David Halt, RN, BSN CASE MANAGER

## 2018-05-17 NOTE — [facility] Group Notes (Signed)
[facility] Group Notes:  (Nursing/MHT/Case Management/Adjunct)  Date:  05/17/2018  Time:  9:58 PM  Type of Therapy:  Group Therapy  Participation Level:  Did Not Attend   Jinger Neighbors 05/17/2018, 9:58 PM

## 2018-05-17 NOTE — Plan of Care (Signed)
Patient is pleasant and cooperative. Denies having any thoughts of self- harm. Patient is observed interacting appropriately with his peers and staff. Compliant with meals and medications. States, "I hope I'm still leaving tomorrow." Patient in bed resting the majority of the morning. Milieu remains safe with q 15 minute safety checks. Will continue to monitor patient.

## 2018-05-17 NOTE — [facility] Group Notes (Signed)
LCSW Group Therapy Note 05/17/2018 1:15pm  Type of Therapy and Topic: Group Therapy: Feelings Around Returning Home & Establishing a Supportive Framework and Supporting Oneself When Supports Not Available  Participation Level: Did Not Attend  Description of Group:  Patients first processed thoughts and feelings about upcoming discharge. These included fears of upcoming changes, lack of change, new living environments, judgements and expectations from others and overall stigma of mental health issues. The group then discussed the definition of a supportive framework, what that looks and feels like, and how do to discern it from an unhealthy non-supportive network. The group identified different types of supports as well as what to do when your family/friends are less than helpful or unavailable  Therapeutic Goals  1. Patient will identify one healthy supportive network that they can use at discharge. 2. Patient will identify one factor of a supportive framework and how to tell it from an unhealthy network. 3. Patient able to identify one coping skill to use when they do not have positive supports from others. 4. Patient will demonstrate ability to communicate their needs through discussion and/or role plays.  Summary of Patient Progress:  Pt was invited to attend group but chose not to attend. CSW will continue to encourage pt to attend group throughout their admission.    Therapeutic Modalities Cognitive Behavioral Therapy Motivational Interviewing   David Davila  CUEBAS-COLON, LCSW 05/17/2018 11:42 AM  

## 2018-05-17 NOTE — Plan of Care (Signed)
Patient is resting quietly anticipating discharge from here to a group home, pace the floor with out any incident, compliant with meds. Voice no concerns, maintaining safety in the unit, progressing nicely, participating in group activities, denies any SI/ HI and signs of AVH , 15 minute safety rounds is maintained , sleep long hours without any interruptions .   Problem: Education: Goal: Knowledge of  General Education information/materials will improve Outcome: Progressing Goal: Emotional status will improve Outcome: Progressing Goal: Mental status will improve Outcome: Progressing Goal: Verbalization of understanding the information provided will improve Outcome: Progressing   Problem: Activity: Goal: Interest or engagement in activities will improve Outcome: Progressing Goal: Sleeping patterns will improve Outcome: Progressing   Problem: Coping: Goal: Ability to verbalize frustrations and anger appropriately will improve Outcome: Progressing Goal: Ability to demonstrate self-control will improve Outcome: Progressing   Problem: Health Behavior/Discharge Planning: Goal: Identification of resources available to assist in meeting health care needs will improve Outcome: Progressing Goal: Compliance with treatment plan for underlying cause of condition will improve Outcome: Progressing   Problem: Physical Regulation: Goal: Ability to maintain clinical measurements within normal limits will improve Outcome: Progressing   Problem: Safety: Goal: Periods of time without injury will increase Outcome: Progressing   Problem: Activity: Goal: Will verbalize the importance of balancing activity with adequate rest periods Outcome: Progressing   Problem: Education: Goal: Will be free of psychotic symptoms Outcome: Progressing Goal: Knowledge of the prescribed therapeutic regimen will improve Outcome: Progressing   Problem: Coping: Goal: Coping ability will  improve Outcome: Progressing Goal: Will verbalize feelings Outcome: Progressing   Problem: Health Behavior/Discharge Planning: Goal: Compliance with prescribed medication regimen will improve Outcome: Progressing   Problem: Nutritional: Goal: Ability to achieve adequate nutritional intake will improve Outcome: Progressing   Problem: Role Relationship: Goal: Ability to communicate needs accurately will improve Outcome: Progressing Goal: Ability to interact with others will improve Outcome: Progressing   Problem: Safety: Goal: Ability to redirect hostility and anger into socially appropriate behaviors will improve Outcome: Progressing Goal: Ability to remain free from injury will improve Outcome: Progressing   Problem: Self-Care: Goal: Ability to participate in self-care as condition permits will improve Outcome: Progressing   Problem: Self-Concept: Goal: Will verbalize positive feelings about self Outcome: Progressing   Problem: Safety: Goal: Ability to remain free from injury will improve Outcome: Progressing

## 2018-05-17 NOTE — Plan of Care (Addendum)
Patient found in common area upon my arrival. Patient is visible and social with some peers this evening. Mood and affect is happy when speaking to patient regarding discharge. Denies all complaints. Compliant with HS medications and staff direction. Given Trazodone for sleep with positive results. Q 15 minute checks maintained. Will continue to monitor throughout the shift.  Patient slept 7.5 hours. No apparent distress. Will endorse care to oncoming shift.  Problem: Education: Goal: Emotional status will improve Outcome: Progressing Goal: Mental status will improve Outcome: Progressing   Problem: Activity: Goal: Interest or engagement in activities will improve Outcome: Progressing Goal: Sleeping patterns will improve Outcome: Progressing   Problem: Coping: Goal: Ability to demonstrate self-control will improve Outcome: Progressing   Problem: Education: Goal: Will be free of psychotic symptoms Outcome: Progressing Goal: Knowledge of the prescribed therapeutic regimen will improve Outcome: Progressing
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
## 2018-05-18 NOTE — [facility] Suicide Risk Assessment (Signed)
[facility] Discharge Suicide Risk Assessment   Principal Problem: Undifferentiated schizophrenia [facility]) Discharge Diagnoses:  Patient Active Problem List   Diagnosis Date Noted  . Undifferentiated schizophrenia ([place]) [F20.3] 03/26/2018    Priority: High  . TBI (traumatic brain injury) ([place]) [S06.9X9A] 07/10/2016  . Vitamin B12 deficiency [E53.8] 07/10/2016  . Noncompliance with medication regimen [Z91.14] 06/25/2016  . Cocaine use disorder, mild, abuse ([place]) [F14.10] 06/25/2016  . Cannabis use disorder, mild, abuse [F12.10] 06/25/2016  . Paranoid schizophrenia ([place]) [F20.0] 06/24/2016   Mental Status Per Nursing Assessment::   On Admission:  NA  Demographic Factors:  Male and Unemployed  Loss Factors: NA  Historical Factors: Impulsivity  Risk Reduction Factors:   Living with another person, especially a relative, Positive social support and Positive therapeutic relationship  Continued Clinical Symptoms:  Schizophrenia:   Paranoid or undifferentiated type  Cognitive Features That Contribute To Risk:  None    Suicide Risk:  Minimal Acute Risk No identifiable suicidal ideation.  Follow-up Information    [place] on 05/25/2018.   Why:  Please follow up with [facility] if you have not met with the ACTT team before now. Thank you. Contact information: [place] [address] [phone]        Services, Psychotherapeutic. Go on 05/25/2018.   Why:  The ACTT Team will contact you after discharge to coordinate a time to meet to complete new patient assessment. Thank you. Contact information: [address] [place] [address] [phone]          Marylin Crosby, MD 05/18/2018, 8:19 AM

## 2018-05-18 NOTE — Progress Notes (Signed)
Recreation Therapy Notes  INPATIENT RECREATION TR PLAN  Patient Details Name: David Davila. MRN: [id number] DOB: October 14, 1980 Today's Date: 05/18/2018  Rec Therapy Plan Is patient appropriate for Therapeutic Recreation?: Yes Treatment times per week: at least 3 Estimated Length of Stay: 5-7 days TR Treatment/Interventions: Group participation (Comment)  Discharge Criteria Pt will be discharged from therapy if:: Discharged Treatment plan/goals/alternatives discussed and agreed upon by:: Patient/family  Discharge Summary Short term goals set: Patient will engage in interactions with peers and staff in pro-social manner at least 2x within 5 recreation therapy group sessions Short term goals met: Not met Progress toward goals comments: Groups attended Which groups?: Leisure education, Self-esteem Reason goals not met: Patient spent most of his time in his room Therapeutic equipment acquired: N/A Reason patient discharged from therapy: Discharge from hospital Pt/family agrees with progress & goals achieved: Yes Date patient discharged from therapy: 05/18/18   Brionna Romanek 05/18/2018, 12:41 PM

## 2018-05-18 NOTE — Progress Notes (Signed)
Recreation Therapy Notes  Date: 05/18/2018  Time: 9:30 am  Location: Craft Room  Behavioral response: Appropriate   Intervention Topic: Self-Esteem  Discussion/Intervention:  Group content today was focused on self-esteem. Patient defined self-esteem and where it comes form. The group described reasons self-esteem is important. Individuals stated things that impact self-esteem and positive ways to improve self-esteem. The group participated in the intervention "Collage of Me" where patients were able to create a collage of positive things that makes them who they are. Clinical Observations/Feedback:  Patient came to group late due to unknown reasons. Individual participated in the intervention and was social with staff.  Jensine Luz LRT/CTRS          Phill Steck 05/18/2018 12:06 PM

## 2018-05-18 NOTE — Progress Notes (Signed)
  [facility] Adult Case Management Discharge Plan :  Will you be returning to the same living situation after discharge:  No. At discharge, do you have transportation home?: Yes,  New group home will come at discharge Do you have the ability to pay for your medications: Yes,  Insurance   Release of information consent forms completed and in the chart;  Patient's signature needed at discharge.  Patient to Follow up at: Follow-up Information    [place] on 05/25/2018.   Why:  Please follow up with [facility] if you have not met with the ACTT team before now. Thank you. Contact information: [place] [address] [phone]        Services, Psychotherapeutic. Go on 05/25/2018.   Why:  The ACTT Team will contact you after discharge to coordinate a time to meet to complete new patient assessment. Thank you. Contact information: [address] [place] [address] [phone]           Next level of care provider has access to [place] and Suicide Prevention discussed: Yes,  Completed with patients guardian  Have you used any form of tobacco in the last 30 days? (Cigarettes, Smokeless Tobacco, Cigars, and/or Pipes): No  Has patient been referred to the Quitline?: N/A patient is not a smoker  Patient has been referred for addiction treatment: N/A  Darin Engels, LCSW 05/18/2018, 9:34 AM

## 2018-05-18 NOTE — Progress Notes (Signed)
Patient alert and oriented x 4. Ambulates unit with steady gait. Verbally denies SI/HI/AVH and pain. Patient discharged on above date and time. Verbalized understanding the discharge information provided to patient upon discharge. Patient departed unit with discharge paperwork, prescriptions, 7 day supply of medications and personal belongings. Picked up by [facility]. No distress noted.

## 2018-09-02 ENCOUNTER — Encounter: Payer: Self-pay | Admitting: Emergency Medicine

## 2018-09-02 ENCOUNTER — Emergency Department
Admission: EM | Admit: 2018-09-02 | Discharge: 2018-09-02 | Disposition: A | Discharge status: Home / Self Care | Payer: Medicare Other | Attending: Student in an Organized Health Care Education/Training Program | Admitting: Student in an Organized Health Care Education/Training Program

## 2018-09-02 DIAGNOSIS — Z760 Encounter for issue of repeat prescription: Secondary | ICD-10-CM | POA: Insufficient documentation

## 2018-09-02 DIAGNOSIS — F250 Schizoaffective disorder, bipolar type: Secondary | ICD-10-CM | POA: Diagnosis not present

## 2018-09-02 DIAGNOSIS — Z87891 Personal history of nicotine dependence: Secondary | ICD-10-CM | POA: Insufficient documentation

## 2018-09-02 DIAGNOSIS — Z87820 Personal history of traumatic brain injury: Secondary | ICD-10-CM | POA: Diagnosis not present

## 2018-09-02 MED ORDER — PALIPERIDONE PALMITATE ER 234 MG/1.5ML IM SUSY: 234.0000 mg | PREFILLED_SYRINGE | Freq: Once | INTRAMUSCULAR | 0 refills | Status: DC

## 2018-09-02 MED ORDER — BENZTROPINE MESYLATE 1 MG PO TABS: 1.0000 mg | ORAL_TABLET | Freq: Every day | ORAL | 0 refills | Status: DC

## 2018-09-02 MED ORDER — TRAZODONE HCL 100 MG PO TABS: 100.0000 mg | ORAL_TABLET | Freq: Every evening | ORAL | 0 refills | Status: DC | PRN

## 2018-09-02 MED ORDER — DIVALPROEX SODIUM 250 MG PO DR TAB: 750.0000 mg | DELAYED_RELEASE_TABLET | Freq: Every day | ORAL | 0 refills | Status: DC

## 2018-09-02 MED ORDER — HALOPERIDOL 5 MG PO TABS: 5.0000 mg | ORAL_TABLET | Freq: Every day | ORAL | 0 refills | Status: DC

## 2018-09-02 MED ORDER — DIVALPROEX SODIUM 500 MG PO DR TAB: 1000.0000 mg | DELAYED_RELEASE_TABLET | ORAL | 0 refills | Status: DC

## 2018-09-02 MED ORDER — TRAZODONE HCL 100 MG PO TABS: 100.0000 mg | ORAL_TABLET | Freq: Every evening | ORAL | 1 refills | Status: DC | PRN

## 2018-09-02 MED ORDER — DIVALPROEX SODIUM 250 MG PO DR TAB: 750.0000 mg | DELAYED_RELEASE_TABLET | Freq: Every day | ORAL | 1 refills | Status: DC

## 2018-09-02 NOTE — ED Notes (Signed)
Pt reports that he needs a refill of his psych medication - medication list faxed from pharmacy to ED for provider to review Pt currently has no psychiatrist and no way to obtain medication

## 2018-09-02 NOTE — ED Triage Notes (Signed)
Patient presents to the ED due to being out of his psychotropic medications.  Patient lives at a transitional care home called [facility]  Patient's MAR was faxed to the ED.  Patient has had difficulty establishing care with psychiatric services because local psychiatric services, "[facility]" has down sized.  Patient is trying to get an appointment with a new psychiatric service but is having difficulty.  Patient has a legal guardian: "Concepcion Elk with the [employer] of [place]: [phone]."

## 2018-09-02 NOTE — ED Provider Notes (Signed)
[facility] Emergency Department Provider Note    First MD Initiated Contact with Patient 09/02/18 1712     (approximate)  I have reviewed the triage vital signs and the nursing notes.   HISTORY  Chief Complaint Medication Refill    HPI David Mulleraul Stephenson Sossamon Jr. is a 38 y.o. male the below listed past medical history presents the ER for medication refill.  Patient recently was released from inpatient psychiatry and they have been having trouble establishing care with a primary psychiatrist but do have follow-up in the next few weeks.  Has been unable to get in to get refills on his antipsychotic medications.  States he is otherwise been doing very well.  Denies any hallucinations SI or HI.  Feels much improved as compared to previous.  Past Medical History:  Diagnosis Date  . Acute psychosis (HCC)   . Antisocial personality disorder (HCC)   . Cannabis abuse    Past  . Paranoid schizophrenia (HCC)   . Schizoaffective disorder, bipolar type (HCC)    No family history on file. History reviewed. No pertinent surgical history. Patient Active Problem List   Diagnosis Date Noted  . 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 TBI (traumatic brain injury) (HCC) 07/10/2016  . Vitamin B12 deficiency 07/10/2016  . Noncompliance with medication regimen 06/25/2016  . Cocaine use disorder, mild, abuse (HCC) 06/25/2016  . Cannabis use disorder, mild, abuse 06/25/2016  . Paranoid schizophrenia (HCC) 06/24/2016      Prior to Admission medications   Medication Sig Start Date End Date Taking? Authorizing Provider  benztropine (COGENTIN) 1 MG tablet Take 1 tablet (1 mg total) by mouth at bedtime. 09/02/18   Willy Eddyobinson, Jeani Fassnacht, MD  divalproex (DEPAKOTE) 250 MG DR tablet Take 3 tablets (750 mg total) by mouth at bedtime for 30 days. 09/02/18 10/02/18  Willy Eddyobinson, Sharmon Cheramie, MD  divalproex (DEPAKOTE) 500 MG DR tablet Take 2 tablets (1,000 mg total) by mouth every  morning. 09/02/18   Willy Eddyobinson, Lenin Kuhnle, MD  haloperidol (HALDOL) 5 MG tablet Take 1 tablet (5 mg total) by mouth at bedtime. 09/02/18   Willy Eddyobinson, Kedrick Mcnamee, MD  paliperidone (INVEGA SUSTENNA) 234 MG/1.5ML SUSY injection Inject 234 mg into the muscle once for 1 dose. Next injection due 05/27/18 09/02/18 09/02/18  Willy Eddyobinson, Jarica Plass, MD  traZODone (DESYREL) 100 MG tablet Take 1 tablet (100 mg total) by mouth at bedtime as needed for sleep. 09/02/18   Willy Eddyobinson, Tanairi Cypert, MD    Allergies Gean BirchwoodInvega sustenna [paliperidone palmitate er] and Zyprexa [olanzapine]    Social History Social History   Tobacco Use  . Smoking status: Former Smoker    Packs/day: 1.00    Types: Cigarettes  . Smokeless tobacco: Never Used  Substance Use Topics  . Alcohol use: No  . Drug use: Not Currently    Review of Systems Patient denies headaches, rhinorrhea, blurry vision, numbness, shortness of breath, chest pain, edema, cough, abdominal pain, nausea, vomiting, diarrhea, dysuria, fevers, rashes or hallucinations unless otherwise stated above in HPI. ____________________________________________   PHYSICAL EXAM:  VITAL SIGNS: Vitals:   09/02/18 1640  BP: (!) 124/58  Pulse: 76  Resp: 16  Temp: 98.1 F (36.7 C)  SpO2: 96%    Constitutional: Alert and oriented. Well appearing and in no acute distress. Eyes: Conjunctivae are normal.  Head: Atraumatic. Nose: No congestion/rhinnorhea. Mouth/Throat: Mucous membranes are moist.   Neck: Painless ROM.  Cardiovascular:   Good peripheral circulation. Respiratory: Normal respiratory effort.  No retractions.  Gastrointestinal: Soft and nontender.  Musculoskeletal: No lower extremity tenderness .  No joint effusions. Neurologic:  Normal speech and language. No gross focal neurologic deficits are appreciated.  Skin:  Skin is warm, dry and intact. No rash noted. Psychiatric:  Speech and behavior are normal.  ____________________________________________   LABS (all  labs ordered are listed, but only abnormal results are displayed)  No results found for this or any previous visit (from the past 24 hour(s)). ____________________________________________ __________________________________  RADIOLOGY   ____________________________________________   PROCEDURES  Procedure(s) performed:  Procedures    Critical Care performed: no ____________________________________________   INITIAL IMPRESSION / ASSESSMENT AND PLAN / ED COURSE  Pertinent labs & imaging results that were available during my care of the patient were reviewed by me and considered in my medical decision making (see chart for details).  DDX: Psychosis, delirium, medication effect, noncompliance, polysubstance abuse, Si, Hi, depression   David Davila. is a 38 y.o. who presents to the ED with need for refill.  Patient with known schizophrenia but is doing quite well on his home medications.  He is currently with acquiring refills as he is been unable to get these previously prescribed.  Do not see any indication for psychiatric consultation or IVC.  Is appearing well.  Will give refill.  Has good outpatient follow-up in the next coming weeks.  Discussed signs and symptoms for which he should return to the ER.      ____________________________________________   FINAL CLINICAL IMPRESSION(S) / ED DIAGNOSES  Final diagnoses:  Medication refill      NEW MEDICATIONS STARTED DURING THIS VISIT:  Current Discharge Medication List       Note:  This document was prepared using Dragon voice recognition software and may include unintentional dictation errors.     Willy Eddy, MD 09/02/18 [phone]

## 2018-09-02 NOTE — Discharge Instructions (Addendum)
Follow up with PCP.  Return for any additional questions or concerns. °

## 2019-04-16 ENCOUNTER — Other Ambulatory Visit: Payer: Self-pay

## 2019-04-16 ENCOUNTER — Emergency Department
Admission: EM | Admit: 2019-04-16 | Discharge: 2019-04-21 | Disposition: A | Discharge status: Home / Self Care | Payer: Medicare Other | Attending: Emergency Medicine | Admitting: Emergency Medicine

## 2019-04-16 DIAGNOSIS — Z79899 Other long term (current) drug therapy: Secondary | ICD-10-CM | POA: Diagnosis not present

## 2019-04-16 DIAGNOSIS — F200 Paranoid schizophrenia: Secondary | ICD-10-CM | POA: Diagnosis not present

## 2019-04-16 DIAGNOSIS — F1494 Cocaine use, unspecified with cocaine-induced mood disorder: Secondary | ICD-10-CM | POA: Diagnosis not present

## 2019-04-16 DIAGNOSIS — F1414 Cocaine abuse with cocaine-induced mood disorder: Secondary | ICD-10-CM | POA: Insufficient documentation

## 2019-04-16 LAB — CBC
HCT: 44.6 % (ref 39.0–52.0)
Hemoglobin: 15 g/dL (ref 13.0–17.0)
MCH: 29.8 pg (ref 26.0–34.0)
MCHC: 33.6 g/dL (ref 30.0–36.0)
MCV: 88.7 fL (ref 80.0–100.0)
Platelets: 208 10*3/uL (ref 150–400)
RBC: 5.03 MIL/uL (ref 4.22–5.81)
RDW: 12.7 % (ref 11.5–15.5)
WBC: 4.2 10*3/uL (ref 4.0–10.5)
nRBC: 0 % (ref 0.0–0.2)

## 2019-04-16 LAB — COMPREHENSIVE METABOLIC PANEL
ALT: 15 U/L (ref 0–44)
AST: 23 U/L (ref 15–41)
Albumin: 4.5 g/dL (ref 3.5–5.0)
Alkaline Phosphatase: 66 U/L (ref 38–126)
Anion gap: 14 (ref 5–15)
BUN: 9 mg/dL (ref 6–20)
CO2: 23 mmol/L (ref 22–32)
Calcium: 9.7 mg/dL (ref 8.9–10.3)
Chloride: 103 mmol/L (ref 98–111)
Creatinine, Ser: 0.95 mg/dL (ref 0.61–1.24)
GFR calc Af Amer: >60 mL/min (ref >60)
GFR calc non Af Amer: >60 mL/min (ref >60)
Glucose, Bld: 92 mg/dL (ref 70–99)
Potassium: 3.4 mmol/L — ABNORMAL LOW (ref 3.5–5.1)
Sodium: 140 mmol/L (ref 135–145)
Total Bilirubin: 1.3 mg/dL — ABNORMAL HIGH (ref 0.3–1.2)
Total Protein: 8.3 g/dL — ABNORMAL HIGH (ref 6.5–8.1)

## 2019-04-16 LAB — URINE DRUG SCREEN, QUALITATIVE (ARMC ONLY)
Amphetamines, Ur Screen: NOT DETECTED
Barbiturates, Ur Screen: NOT DETECTED
Benzodiazepine, Ur Scrn: NOT DETECTED
Cannabinoid 50 Ng, Ur ~~LOC~~: NOT DETECTED
Cocaine Metabolite,Ur ~~LOC~~: POSITIVE — AB
MDMA (Ecstasy)Ur Screen: NOT DETECTED
Methadone Scn, Ur: NOT DETECTED
Opiate, Ur Screen: NOT DETECTED
Phencyclidine (PCP) Ur S: NOT DETECTED
Tricyclic, Ur Screen: NOT DETECTED

## 2019-04-16 LAB — SALICYLATE LEVEL: Salicylate Lvl: <7 mg/dL (ref 2.8–30.0)

## 2019-04-16 LAB — ACETAMINOPHEN LEVEL: Acetaminophen (Tylenol), Serum: <10 ug/mL — ABNORMAL LOW (ref 10–30)

## 2019-04-16 LAB — ETHANOL: Alcohol, Ethyl (B): <10 mg/dL (ref <10)

## 2019-04-16 MED ORDER — LAMOTRIGINE 25 MG PO TABS: 25.0000 mg | ORAL_TABLET | Freq: Every day | ORAL | Status: DC

## 2019-04-16 MED ORDER — PALIPERIDONE ER 3 MG PO TB24
3.0000 mg | ORAL_TABLET | Freq: Every day | ORAL | Status: DC
  Administered 2019-04-17: 3 mg via ORAL
  Filled 2019-04-16: qty 1

## 2019-04-16 MED ORDER — TRAZODONE HCL 100 MG PO TABS
100.0000 mg | ORAL_TABLET | Freq: Every day | ORAL | Status: DC
  Administered 2019-04-17 – 2019-04-20 (×4): 100 mg via ORAL
  Filled 2019-04-16 (×5): qty 1

## 2019-04-16 MED ORDER — DIVALPROEX SODIUM 500 MG PO DR TAB
500.0000 mg | DELAYED_RELEASE_TABLET | Freq: Two times a day (BID) | ORAL | Status: DC
  Administered 2019-04-17 – 2019-04-21 (×9): 500 mg via ORAL
  Filled 2019-04-16 (×10): qty 1

## 2019-04-16 MED ORDER — ATORVASTATIN CALCIUM 10 MG PO TABS
10.0000 mg | ORAL_TABLET | Freq: Every day | ORAL | Status: DC
  Administered 2019-04-20: 10 mg via ORAL
  Filled 2019-04-16 (×2): qty 1

## 2019-04-16 MED ORDER — BENZTROPINE MESYLATE 1 MG PO TABS
1.0000 mg | ORAL_TABLET | Freq: Every day | ORAL | Status: DC
  Administered 2019-04-17 – 2019-04-21 (×5): 1 mg via ORAL
  Filled 2019-04-16 (×5): qty 1

## 2019-04-16 MED ORDER — OLANZAPINE 5 MG PO TABS: 5.0000 mg | ORAL_TABLET | Freq: Two times a day (BID) | ORAL | Status: DC

## 2019-04-16 MED ORDER — HALOPERIDOL 5 MG PO TABS
5.0000 mg | ORAL_TABLET | Freq: Two times a day (BID) | ORAL | Status: DC
  Administered 2019-04-17 – 2019-04-21 (×9): 5 mg via ORAL
  Filled 2019-04-16 (×10): qty 1

## 2019-04-16 MED ORDER — LORATADINE 10 MG PO TABS
10.0000 mg | ORAL_TABLET | Freq: Every day | ORAL | Status: DC
  Administered 2019-04-17 – 2019-04-21 (×5): 10 mg via ORAL
  Filled 2019-04-16 (×5): qty 1

## 2019-04-16 NOTE — ED Notes (Signed)
Patient assigned to appropriate care area   Introduced self to pt  Patient oriented to unit/care area: Informed that, for their safety, care areas are designed for safety and visiting and phone hours explained to patient. Patient verbalizes understanding, and verbal contract for safety obtained  Environment secured  

## 2019-04-16 NOTE — ED Notes (Signed)
Meal tray given to pt.

## 2019-04-16 NOTE — ED Triage Notes (Signed)
Bought in under IVC due to non compliance with mediations and aggressive actions/threats towards other residents at home he is living in. Pt somnolent and does not disclose much information. Denies Si or HI.

## 2019-04-16 NOTE — ED Notes (Signed)
Pt belongings: - shoes - mask - boxers - pants - tank top - composition note book

## 2019-04-16 NOTE — BH Assessment (Signed)
Writer received a phone call from patient's guardian representative with the [employer] of [place]ia [place]., [phone]) and the home he's currently living at. They report the patient hasn't had his medications in approximately three weeks. They have tried to get him appointments with [facility], community mental health. When he had the appointment, he became upset and it reinforce his paranoia. The appointment was virtual and he had already believed people were following and trying to hurt him. Prior to the three weeks of no medications, he was in jail for approximately six months. While in jail, they change what medications he was taking and the patient began to regress. When he was released, he refused to take any medications, which is the current three weeks and his symptoms increased.  Per the guardian, the patient told her the medications was in his father's name, they weren't for him and he wasn't going to take them. She further reports, the regiment of medications he was taking, when he was discharged from [facility] (03/2018) was working well for him. The home he lives at states he can return when he is stable.

## 2019-04-16 NOTE — ED Notes (Signed)
Report called to [place], [place] going to [facility]

## 2019-04-16 NOTE — ED Notes (Signed)
IVC/Consult Pending  

## 2019-04-16 NOTE — ED Notes (Signed)
Pt. Currently sleeping at this time.

## 2019-04-16 NOTE — ED Notes (Signed)
Legal guardian, [name] contacted and gives consent for tx. States that she is the one who IVC'ed him-reports pt has been asking others for money, possibly using that money to buy drugs and start using. Reports pt recently released from jail.   Reports being pt wants to be called David Davila.  Non compliance with medications X 3 weeks.

## 2019-04-16 NOTE — ED Notes (Signed)
Pt calm and cooperative at this time. Pt denies SI/HI. Pt denies visual or auditory hallucinations. Pt states that he is here because he got into an argument with his guardian. Pt states that he has been off of his mediations because he has not been able to go to [facility]. Pt states that he has been having to do virtual visits. Pt denies any needs at this time.

## 2019-04-16 NOTE — ED Notes (Signed)
TTS unable to conduct assessment as patient was unable to be aroused to participate with the assessor.

## 2019-04-16 NOTE — ED Provider Notes (Signed)
[facility] Emergency Department Provider Note  ____________________________________________   First MD Initiated Contact with Patient 04/16/19 1625     (approximate)  I have reviewed the triage vital signs and the nursing notes.   HISTORY  Chief Complaint Other (IVC) and Aggressive Behavior    HPI David Davila. is a 38 y.o. male  With h/o schizoaffective d/o, antisocial PD here with agitation, aggression. Report from PD is that they were called to scene 2/2 agitation, aggression. Has not been taking his medications. He arrives under IVC from his group home. On my assessment, pt is evasive but states he is here to get tested for coronavirus. Denies any fever, cough, CP, SOB. He admits to being out of his medications for "like a week." Denies SI, HI, AVH but is evasive on exam.  Level 5 caveat invoked as remainder of history, ROS, and physical exam limited due to patient's psychiatric condition.         Past Medical History:  Diagnosis Date  . Acute psychosis ([place])   . Antisocial personality disorder ([place])   . Cannabis abuse    Past  . Paranoid schizophrenia ([place])   . Schizoaffective disorder, bipolar type [facility])     Patient Active Problem List   Diagnosis Date Noted  . Undifferentiated schizophrenia ([place]) 03/26/2018  . TBI (traumatic brain injury) ([place]) 07/10/2016  . Vitamin B12 deficiency 07/10/2016  . Noncompliance with medication regimen 06/25/2016  . Cocaine use disorder, mild, abuse ([place]) 06/25/2016  . Cannabis use disorder, mild, abuse 06/25/2016  . Paranoid schizophrenia ([place]) 06/24/2016    History reviewed. No pertinent surgical history.  Prior to Admission medications   Medication Sig Start Date End Date Taking? Authorizing Provider  benztropine (COGENTIN) 1 MG tablet Take 1 tablet (1 mg total) by mouth at bedtime. 09/02/18   Merlyn Lot, MD  divalproex (DEPAKOTE) 250 MG DR tablet Take 3 tablets (750 mg total) by  mouth at bedtime for 30 days. 09/02/18 10/02/18  Merlyn Lot, MD  divalproex (DEPAKOTE) 500 MG DR tablet Take 2 tablets (1,000 mg total) by mouth every morning. 09/02/18   Merlyn Lot, MD  haloperidol (HALDOL) 5 MG tablet Take 1 tablet (5 mg total) by mouth at bedtime. 09/02/18   Merlyn Lot, MD  paliperidone (INVEGA SUSTENNA) 234 MG/1.5ML SUSY injection Inject 234 mg into the muscle once for 1 dose. Next injection due 05/27/18 09/02/18 09/02/18  Merlyn Lot, MD  traZODone (DESYREL) 100 MG tablet Take 1 tablet (100 mg total) by mouth at bedtime as needed for sleep. 09/02/18   Merlyn Lot, MD    Allergies Kirt Boys [paliperidone palmitate er] and Zyprexa [olanzapine]  No family history on file.  Social History Social History   Tobacco Use  . Smoking status: Current Every Day Smoker    Packs/day: 1.00    Types: Cigarettes  . Smokeless tobacco: Never Used  Substance Use Topics  . Alcohol use: No  . Drug use: Not Currently    Review of Systems  Review of Systems  Unable to perform ROS: Psychiatric disorder     ____________________________________________  PHYSICAL EXAM:      VITAL SIGNS: ED Triage Vitals [04/16/19 1535]  Enc Vitals Group     BP 115/73     Pulse Rate (!) 101     Resp 18     Temp 98.4 F (36.9 C)     Temp Source Oral     SpO2 100 %  Weight 140 lb (63.5 kg)     Height 5\' 6"  (1.676 m)     Head Circumference      Peak Flow      Pain Score 0     Pain Loc      Pain Edu?      Excl. in GC?      Physical Exam Vitals signs and nursing note reviewed.  Constitutional:      General: He is not in acute distress.    Appearance: He is well-developed.  HENT:     Head: Normocephalic and atraumatic.  Eyes:     Conjunctiva/sclera: Conjunctivae normal.  Neck:     Musculoskeletal: Neck supple.  Cardiovascular:     Rate and Rhythm: Normal rate and regular rhythm.     Heart sounds: Normal heart sounds.  Pulmonary:     Effort:  Pulmonary effort is normal. No respiratory distress.     Breath sounds: No wheezing.  Abdominal:     General: There is no distension.  Skin:    General: Skin is warm.     Capillary Refill: Capillary refill takes less than 2 seconds.     Findings: No rash.  Neurological:     Mental Status: He is alert and oriented to person, place, and time.     Motor: No abnormal muscle tone.  Psychiatric:        Mood and Affect: Affect is blunt.        Speech: He is noncommunicative.        Behavior: Behavior is withdrawn.       ____________________________________________   LABS (all labs ordered are listed, but only abnormal results are displayed)  Labs Reviewed  COMPREHENSIVE METABOLIC PANEL - Abnormal; Notable for the following components:      Result Value   Potassium 3.4 (*)    Total Protein 8.3 (*)    Total Bilirubin 1.3 (*)    All other components within normal limits  ACETAMINOPHEN LEVEL - Abnormal; Notable for the following components:   Acetaminophen (Tylenol), Serum <10 (*)    All other components within normal limits  ETHANOL  SALICYLATE LEVEL  CBC  URINE DRUG SCREEN, QUALITATIVE ([facility] ONLY)    ____________________________________________  EKG: None ________________________________________  RADIOLOGY All imaging, including plain films, CT scans, and ultrasounds, independently reviewed by me, and interpretations confirmed via formal radiology reads.  ED MD interpretation:   None  Official radiology report(s): No results found.  ____________________________________________  PROCEDURES   Procedure(s) performed (including Critical Care):  Procedures  ____________________________________________  INITIAL IMPRESSION / MDM / ASSESSMENT AND PLAN / ED COURSE  As part of my medical decision making, I reviewed the following data within the electronic MEDICAL RECORD NUMBER Notes from prior ED visits and [place] Controlled Substance Database      *David Mulleraul Stephenson Teicher  Jr. was evaluated in Emergency Department on 04/16/2019 for the symptoms described in the history of present illness. He was evaluated in the context of the global COVID-19 pandemic, which necessitated consideration that the patient might be at risk for infection with the SARS-CoV-2 virus that causes COVID-19. Institutional protocols and algorithms that pertain to the evaluation of patients at risk for COVID-19 are in a state of rapid change based on information released by regulatory bodies including the CDC and federal and state organizations. These policies and algorithms were followed during the patient's care in the ED.  Some ED evaluations and interventions may be delayed as a result of limited staffing  during the pandemic.*      Medical Decision Making: 38 yo M here with agitation at group home. Off meds with delusions, paranoia. No apparent medical etiology. He is calm here but does meet IVC criteria, will c/s TTS and Psych.  ____________________________________________  FINAL CLINICAL IMPRESSION(S) / ED DIAGNOSES  Final diagnoses:  Paranoid schizophrenia (HCC)     MEDICATIONS GIVEN DURING THIS VISIT:  Medications - No data to display   ED Discharge Orders    None       Note:  This document was prepared using Dragon voice recognition software and may include unintentional dictation errors.   Shaune Pollack, MD 04/16/19 [phone]

## 2019-04-16 NOTE — ED Notes (Signed)
Patient is in room and appears to be asleep, NAD noted

## 2019-04-16 NOTE — ED Notes (Signed)
Pt ambulated to the restroom with out difficulty.

## 2019-04-17 DIAGNOSIS — F1494 Cocaine use, unspecified with cocaine-induced mood disorder: Secondary | ICD-10-CM

## 2019-04-17 MED ORDER — PALIPERIDONE ER 3 MG PO TB24
6.0000 mg | ORAL_TABLET | Freq: Every day | ORAL | Status: DC
  Administered 2019-04-18 – 2019-04-19 (×2): 6 mg via ORAL
  Filled 2019-04-17 (×2): qty 2

## 2019-04-17 MED ORDER — BENZTROPINE MESYLATE 1 MG PO TABS: 1.0000 mg | ORAL_TABLET | Freq: Every day | ORAL | 0 refills | Status: DC

## 2019-04-17 MED ORDER — ATORVASTATIN CALCIUM 10 MG PO TABS: 10.0000 mg | ORAL_TABLET | Freq: Every day | ORAL | 0 refills | Status: DC

## 2019-04-17 MED ORDER — HALOPERIDOL 5 MG PO TABS: 5.0000 mg | ORAL_TABLET | Freq: Every day | ORAL | 0 refills | Status: DC

## 2019-04-17 MED ORDER — TRAZODONE HCL 100 MG PO TABS: 100.0000 mg | ORAL_TABLET | Freq: Every evening | ORAL | 0 refills | Status: DC | PRN

## 2019-04-17 MED ORDER — PALIPERIDONE ER 6 MG PO TB24: 6.0000 mg | ORAL_TABLET | Freq: Every day | ORAL | 0 refills | Status: DC

## 2019-04-17 MED ORDER — DIVALPROEX SODIUM 500 MG PO DR TAB: 1000.0000 mg | DELAYED_RELEASE_TABLET | ORAL | 0 refills | Status: DC

## 2019-04-17 NOTE — BH Assessment (Signed)
TTS attempted to reach the guardian: David Forts., [phone] with the [employer] of [place]), and the voicemail listed two after hours/on call numbers to try: [phone] and [phone].   TTS attempted [phone] and spoke with David Davila.  She stated he would return to residential placement [facility].  The contact is David Davila [phone].

## 2019-04-17 NOTE — ED Provider Notes (Signed)
@  [facility]@  Blood pressure (!) 166/68, pulse 62, temperature 97.9 F (36.6 C), temperature source Oral, resp. rate 18, height 5\' 6"  (1.676 m), weight 63.5 kg, SpO2 97 %.  The patient is calm and cooperative at this time.  There have been no acute events since the last update.  Awaiting disposition plan from Behavioral Medicine team. Pending going back to group home.    Vanessa [place], MD 04/17/19 [phone]

## 2019-04-17 NOTE — ED Notes (Signed)
Pt. Calm and cooperative in room.  Pt. Awaiting discharge, waiting for contact with guardian and group home.

## 2019-04-17 NOTE — ED Notes (Signed)
Pt. Given snack. 

## 2019-04-17 NOTE — ED Notes (Signed)
Called Karen(Guardian) [phone] left message on machine to call this nurse [phone].

## 2019-04-17 NOTE — Discharge Instructions (Signed)
[facility] (Mental Health & Substance Use Services) & [facility]  Mental health service in [place], [place] Address: [address] [place], [place] [address] Hours:  Closed ? Caesar Bookman Phone: [phone]  Managing Schizophrenia If you have been diagnosed with schizophrenia, you may be relieved to know why you have felt or behaved a certain way. Still, you may have questions about the treatment ahead, how to get the support you need, and how to deal with the condition day-to-day. With care and support, you can learn to manage your symptoms and live with schizophrenia. How to manage lifestyle changes Managing stress Stress is your body's reaction to life changes and events, both good and bad. For people with schizophrenia, stress can sometimes cause an episode to start (can be a trigger), so it is important to learn ways to manage stress. Your health care provider, therapist, or counselor may suggest some techniques such as:  Meditation, muscle relaxation, and breathing exercises.  Music therapy. This can include creating music or listening to music.  Life skills training. This training is focused on work, self-care, money, house management, and social skills. Other things you can do to manage stress include:  Keeping a stress diary. This can help you learn what causes your stress to start and how you can control your response to those triggers.  Exercising. Even a short daily walk can help.  Getting enough sleep.  Making a schedule to manage your time. Knowing what you will do from day to day helps you avoid feeling overwhelmed by tasks and deadlines.  Spending time on hobbies you enjoy that help you relax.  Medicines Antipsychotic medicines are usually prescribed to help manage this condition. Make sure you:  Talk with your pharmacist or health care provider about all medicines that you  take, the possible side effects, and which medicines are safe to take together.  Make it your goal to take part in all treatment decisions (shared decision-making). Ask about possible side effects of medicines that your health care provider recommends, and tell him or her how you feel about having those side effects. It is best if shared decision-making with your health care provider is part of your total treatment plan. Relationships Having the support of your family and friends can play a major role in the success of your treatment. The following steps can help you maintain healthy relationships:  Think about going to couples therapy, family therapy, or family education classes.  Create a written plan for your treatment, and include close family members and friends in the process.  Consider bringing your partner or another family member or friend to the appointments you have with your health care provider. How to recognize changes in your condition If you find that your condition is getting worse, talk to your health care provider right away. Watch for these signs:  You hear, see, taste, and things that others do not (hallucinate).  You cannot set aside persistent, unusual thoughts or beliefs, no matter what others may believe.  Your speech becomes unclear.  You withdraw from friends and family members.  You have racing thoughts and have trouble thinking clearly or staying focused.  You have poor personal hygiene, weight gain or weight loss, or changes in how you are sleeping or eating. Follow these instructions at home:  Take over-the-counter and prescription medicines only as told by your health care provider. Do not start new medicines or  stop taking medicines before you ask your health care provider if it is safe to make those changes.  Avoid caffeine, alcohol, and drugs. They can affect how your medicine works and can make your symptoms worse.  Eat a healthy diet.  Look for  support groups in your area so you can meet other people with your condition. You will learn different methods that others have used to manage schizophrenia.  Keep all follow-up visits as told by your health care provider, therapist, or counselor. This is important. Where to find support Talking to others  Reach out to trusted friends or family members, explain your condition, and let them know that you are working with a health care team.  Consider giving educational materials to friends and family.  If you are having trouble telling your friends and family about your condition, keep in mind that honest and open communication can make these conversations easier. Finances Be sure to check with your insurance carrier to find out what treatment options are covered by your plan. You may also be able to find financial assistance through not-for-profit organizations or with local government-based resources. If you are taking medicines, you may be able to get the generic form, which may be less expensive than brand-name medicine. Some makers of prescription medicines also offer help to patients who cannot afford the medicines that they need. Therapy and support groups  Make sure you find a counselor or therapist who is familiar with schizophrenia. Meet with your counselor or therapist once a week or more often if needed.  Find support programs for people with schizophrenia. Where to find more information [employer]ness: www.nami.org Contact a health care provider if:  You are not able to take your medicines as prescribed.  Your symptoms get worse. Get help right away if:  You have serious thoughts about hurting yourself or others. If you ever feel like you may hurt yourself or others, or have thoughts about taking your own life, get help right away. You can go to your nearest emergency department or call:  Your local emergency services (911 in the [place]  A suicide  crisis helpline, such as the National Suicide Prevention Lifeline at (303)093-93801-(718)272-8202. This is open 24 hours a day. Summary  Schizophrenia is a lifelong illness. It is best controlled with continuous treatment that includes medicine and therapy.  Learning ways to deal with stress may help your treatment work better.  Having the support of your family and friends can help to make your treatment a success.  If you find that your condition is getting worse, talk to your health care provider right away. This information is not intended to replace advice given to you by your health care provider. Make sure you discuss any questions you have with your health care provider. Document Released: 11/28/2016 Document Revised: 11/20/2018 Document Reviewed: 11/28/2016 Elsevier Patient Education  2020 [employer]

## 2019-04-17 NOTE — BH Assessment (Signed)
TTS received return call from [facility] with updated phone number for David Davila. The updated number is [phone].  [name] reported she has attempted to reach [name] by multiple means without success (telephone, email, fax) but will continue to try and be in touch with [facility].

## 2019-04-17 NOTE — Consult Note (Addendum)
[facility] Psych ED Discharge  04/17/2019 11:07 AM David Davila.  MRN:  [id number] Principal Problem: Cocaine-induced mood disorder [name]) Active Problems:   Paranoid schizophrenia (HCC)  Subjective: "I'm good".  Reports living in a transitional house and was "just sitting in the house and the police came."  Denies suicidal/homicidal ideations, hallucinations, and withdrawal symptom.  Minimizes his substance use.  He does report trying to see [facility] virtually but had difficulty.  Denies medication adverse effects.  Patient seen and evaluated by this provider.  He was in jail recently and his medications were changed.  After being released, he stopped taking medications.  His guardian reports failed appointments at [facility] but would like him back on the medications that stabilized him during his last inpatient hospitalization.  These were restarted yesterday based from the discharge summary from Dr Johnella Moloney.  He has been calm and cooperative with no suicidal/homicidal ideations, hallucinations, or withdrawal symptoms. Psychiatrically clear to return to his home.  HPI Per TTS:   Clinical research associate received a phone call from patient's guardian representative with the [employer] [place] [name], [phone]) and the home he's currently living at. They report the patient hasn't had his medications in approximately three weeks. They have tried to get him appointments with [facility], community mental health. When he had the appointment, he became upset and it reinforce his paranoia. The appointment was virtual and he had already believed people were following and trying to hurt him. Prior to the three weeks of no medications, he was in jail for approximately six months. While in jail, they change what medications he was taking and the patient began to regress. When he was released, he refused to take any medications, which is the current three  weeks and his symptoms increased.  Per the guardian, the patient told her the medications was in his father's name, they weren't for him and he wasn't going to take them. She further reports, the regiment of medications he was taking, when he was discharged from [facility] (03/2018) was working well for him. The home he lives at states he can return when he is stable.         Total Time spent with patient: 1 hour  Past Psychiatric History: schizophrenia, substance abuse  Past Medical History:  Past Medical History:  Diagnosis Date  . Acute psychosis (HCC)   . Antisocial personality disorder (HCC)   . Cannabis abuse    Past  . Paranoid schizophrenia (HCC)   . Schizoaffective disorder, bipolar type (HCC)    History reviewed. No pertinent surgical history. Family History: No family history on file. Family Psychiatric  History: none Social History:  Social History   Substance and Sexual Activity  Alcohol Use No     Social History   Substance and Sexual Activity  Drug Use Not Currently    Social History   Socioeconomic History  . Marital status: Single    Spouse name: Not on file  . Number of children: Not on file  . Years of education: Not on file  . Highest education level: Not on file  Occupational History  . Not on file  Social Needs  . Financial resource strain: Not on file  . Food insecurity    Worry: Not on file    Inability: Not on file  . Transportation needs    Medical: Not on file    Non-medical: Not on file  Tobacco  Use  . Smoking status: Current Every Day Smoker    Packs/day: 1.00    Types: Cigarettes  . Smokeless tobacco: Never Used  Substance and Sexual Activity  . Alcohol use: No  . Drug use: Not Currently  . Sexual activity: Not Currently  Lifestyle  . Physical activity    Days per week: Not on file    Minutes per session: Not on file  . Stress: Not on file  Relationships  . Social Musicianconnections    Talks on phone: Not on file    Gets  together: Not on file    Attends religious service: Not on file    Active member of club or organization: Not on file    Attends meetings of clubs or organizations: Not on file    Relationship status: Not on file  Other Topics Concern  . Not on file  Social History Narrative  . Not on file    Has this patient used any form of tobacco in the last 30 days? (Cigarettes, Smokeless Tobacco, Cigars, and/or Pipes) A prescription for an FDA-approved tobacco cessation medication was offered at discharge and the patient refused  Current Medications: Current Facility-Administered Medications  Medication Dose Route Frequency Provider Last Rate Last Dose  . atorvastatin (LIPITOR) tablet 10 mg  10 mg Oral q1800 Charm RingsLord, Jamison Y, NP      . benztropine (COGENTIN) tablet 1 mg  1 mg Oral Daily Charm RingsLord, Jamison Y, NP   1 mg at 04/17/19 [id number]  . divalproex (DEPAKOTE) DR tablet 500 mg  500 mg Oral Q12H Charm RingsLord, Jamison Y, NP   500 mg at 04/17/19 [id number]  . haloperidol (HALDOL) tablet 5 mg  5 mg Oral BID Charm RingsLord, Jamison Y, NP   5 mg at 04/17/19 [id number]  . loratadine (CLARITIN) tablet 10 mg  10 mg Oral Daily Charm RingsLord, Jamison Y, NP   10 mg at 04/17/19 [id number]  . paliperidone (INVEGA) 24 hr tablet 3 mg  3 mg Oral Daily Charm RingsLord, Jamison Y, NP   3 mg at 04/17/19 [id number]  . traZODone (DESYREL) tablet 100 mg  100 mg Oral QHS Charm RingsLord, Jamison Y, NP   Stopped at 04/16/19 2127   Current Outpatient Medications  Medication Sig Dispense Refill  . benztropine (COGENTIN) 1 MG tablet Take 1 tablet (1 mg total) by mouth at bedtime. 30 tablet 0  . divalproex (DEPAKOTE) 250 MG DR tablet Take 3 tablets (750 mg total) by mouth at bedtime for 30 days. 90 tablet 0  . divalproex (DEPAKOTE) 500 MG DR tablet Take 2 tablets (1,000 mg total) by mouth every morning. 60 tablet 0  . haloperidol (HALDOL) 5 MG tablet Take 1 tablet (5 mg total) by mouth at bedtime. 30 tablet 0  . paliperidone (INVEGA SUSTENNA) 234 MG/1.5ML SUSY injection Inject 234 mg into the muscle once  for 1 dose. Next injection due 05/27/18 1.5 mL 0  . traZODone (DESYREL) 100 MG tablet Take 1 tablet (100 mg total) by mouth at bedtime as needed for sleep. 30 tablet 0   PTA Medications: (Not in a hospital admission)   Musculoskeletal: Strength & Muscle Tone: within normal limits Gait & Station: normal Patient leans: N/A  Psychiatric Specialty Exam: Physical Exam  Nursing note and vitals reviewed. Constitutional: He is oriented to person, place, and time. He appears well-developed and well-nourished.  HENT:  Head: Normocephalic.  Neck: Normal range of motion.  Respiratory: Effort normal.  Musculoskeletal: Normal range of motion.  Neurological: He is alert and oriented  to person, place, and time.  Psychiatric: His speech is normal and behavior is normal. Judgment and thought content normal. His mood appears anxious. Cognition and memory are normal.    Review of Systems  Psychiatric/Behavioral: Positive for substance abuse. The patient is nervous/anxious.   All other systems reviewed and are negative.   Blood pressure (!) 166/68, pulse 62, temperature 97.9 F (36.6 C), temperature source Oral, resp. rate 18, height 5\' 6"  (1.676 m), weight 63.5 kg, SpO2 97 %.Body mass index is 22.6 kg/m.  General Appearance: Casual  Eye Contact:  Good  Speech:  Normal Rate  Volume:  Normal  Mood:  Anxious  Affect:  Congruent  Thought Process:  Coherent and Descriptions of Associations: Intact  Orientation:  Full (Time, Place, and Person)  Thought Content:  WDL and Logical  Suicidal Thoughts:  No  Homicidal Thoughts:  No  Memory:  Immediate;   Fair Recent;   Fair Remote;   Fair  Judgement:  Fair  Insight:  Fair  Psychomotor Activity:  Normal  Concentration:  Concentration: Good and Attention Span: Good  Recall:  Good  Fund of Knowledge:  Fair  Language:  Good  Akathisia:  No  Handed:  Right  AIMS (if indicated):     Assets:  Housing Leisure Time Physical Health Resilience Social  Support  ADL's:  Intact  Cognition:  WNL  Sleep:        Demographic Factors:  Male  Loss Factors: Legal issues  Historical Factors: NA  Risk Reduction Factors:   Sense of responsibility to family, Living with another person, especially a relative, Positive social support and Positive therapeutic relationship  Continued Clinical Symptoms:  Anxiety, mild  Cognitive Features That Contribute To Risk:  None    Suicide Risk:  Minimal: No identifiable suicidal ideation.  Patients presenting with no risk factors but with morbid ruminations; may be classified as minimal risk based on the severity of the depressive symptoms   Plan Of Care/Follow-up recommendations:  Cocaine induced mood disorder: -[facility] resources  Schizophrenia: -Restarted Invega 3 mg yesterday and increased to 6 mg today, [place] outpatient -Started Depakote 500 mg BId -Started Haldol 5 mg BID  EPS: -Started Cogentin 1 mg daily  Insomnia: -Started Trazodone 100 mg at bedtime PRN Activity:  as tolerated Diet:  heart healthy diet  Disposition: discharge home Waylan Boga, NP 04/17/2019, 11:07 AM   Patient discussed, plan assessed, and agree with plan as outlined by Dr Reita Cliche.

## 2019-04-17 NOTE — BH Assessment (Signed)
TTS attempted to reach Elicia Lamp [phone]/[id number].  A male answered and stated it was the wrong number.  TTS confirmed the number and the male stated it was not [name] nor was it [facility].

## 2019-04-17 NOTE — ED Notes (Signed)
Pt. Got up to the bathroom.  Pt. Asked "How are you feeling", pt. Did not answer.  Pt. Asked if he was hungry or thirsty, pt. Denied.  Bathroom door unlocked pt. Used bathroom and returned to room with steady gait.

## 2019-04-17 NOTE — ED Notes (Signed)
Vol pending group home

## 2019-04-18 NOTE — ED Notes (Signed)
Hourly rounding reveals patient sleeping in room. No complaints, stable, in no acute distress. Q15 minute rounds and monitoring via Security Cameras to continue. 

## 2019-04-18 NOTE — ED Notes (Signed)

## 2019-04-18 NOTE — ED Notes (Signed)
Hourly rounding reveals patient in room. No complaints, stable, in no acute distress. Q15 minute rounds and monitoring via Security Cameras to continue. 

## 2019-04-18 NOTE — ED Notes (Signed)
ED BHU PLACEMENT JUSTIFICATION Is the patient under IVC or is there intent for IVC: Yes.   Is the patient medically cleared: Yes.   Is there vacancy in the ED BHU: Yes.   Is the population mix appropriate for patient: Yes.   Is the patient awaiting placement in inpatient or outpatient setting: Yes.   Has the patient had a psychiatric consult: Yes.   Survey of unit performed for contraband, proper placement and condition of furniture, tampering with fixtures in bathroom, shower, and each patient room: Yes.  ; Findings:  APPEARANCE/BEHAVIOR Calm and cooperative NEURO ASSESSMENT Orientation: oriented x3  Denies pain Hallucinations: No.None noted (Hallucinations) denies Speech: Normal Gait: normal RESPIRATORY ASSESSMENT Even  Unlabored respirations  CARDIOVASCULAR ASSESSMENT Pulses equal   regular rate  Skin warm and dry   GASTROINTESTINAL ASSESSMENT no GI complaint EXTREMITIES Full ROM  PLAN OF CARE Provide calm/safe environment. Vital signs assessed twice daily. ED BHU Assessment once each 12-hour shift.  Assure the ED provider has rounded once each shift. Provide and encourage hygiene. Provide redirection as needed. Assess for escalating behavior; address immediately and inform ED provider.  Assess family dynamic and appropriateness for visitation as needed: Yes.  ; If necessary, describe findings:  Educate the patient/family about BHU procedures/visitation: Yes.  ; If necessary, describe findings:   

## 2019-04-18 NOTE — ED Notes (Signed)
Report to include Situation, Background, Assessment, and Recommendations received from Amy Teague RN. Patient alert and oriented, warm and dry, in no acute distress. Patient denies SI, HI, AVH and pain. Patient made aware of Q15 minute rounds and security cameras for their safety. Patient instructed to come to me with needs or concerns. 

## 2019-04-18 NOTE — ED Notes (Signed)
BEHAVIORAL HEALTH ROUNDING Patient sleeping: No. Patient alert and oriented: yes Behavior appropriate: Yes.  ; If no, describe:  Nutrition and fluids offered: yes Toileting and hygiene offered: Yes  Sitter present: q15 minute observations and security camera monitoring   

## 2019-04-18 NOTE — ED Notes (Signed)
Pt. Came to nursing station and requested some OJ, pt. Given cup of OJ.

## 2019-04-18 NOTE — ED Provider Notes (Signed)
-----------------------------------------   6:31 AM on 04/18/2019 -----------------------------------------   Blood pressure 103/62, pulse 72, temperature 98.5 F (36.9 C), temperature source Oral, resp. rate 16, height 5\' 6"  (1.676 m), weight 63.5 kg, SpO2 100 %.  The patient is calm and cooperative at this time.  There have been no acute events since the last update.  Awaiting disposition plan from Behavioral Medicine and/or Social Work team(s).   Paulette Blanch, MD 04/18/19 [phone]

## 2019-04-18 NOTE — ED Notes (Signed)
Vol pending group home placement 

## 2019-04-18 NOTE — ED Notes (Signed)
BEHAVIORAL HEALTH ROUNDING Patient sleeping: Yes.   Patient alert and oriented: eyes closed  Appears asleep Behavior appropriate: Yes.  ; If no, describe:  Nutrition and fluids offered: Yes  Toileting and hygiene offered: sleeping Sitter present: q 15 minute observations and security camera monitoring  

## 2019-04-18 NOTE — ED Notes (Signed)
Patient observed lying in bed with eyes closed  Even, unlabored respirations observed   NAD pt appears to be sleeping  I will continue to monitor along with every 15 minute visual observations and ongoing security camera monitoring    

## 2019-04-18 NOTE — BH Assessment (Signed)
TTS returned call to [phone] of [place]  who is requesting that the patient receive a voucher to be transported to the group home address  ([address]; [place], [place] [address]) which is about 40 miles away from [facility]. TTS explained risk associated with this including the guardianship company's previous complaints about the patient eloping from the group home facility.   TTS encouraged Lucy to make arrangements for someone from the guardianship service to pick up the patient and transfer him to an appropriate residence.

## 2019-04-18 NOTE — BH Assessment (Signed)
TTS attempted to reach David Davila/[phone] without success.  A HIPPA compliant message was left on the voicemail.   TTS telephoned the guardianship service [phone] and spoke to David Davila.  David Davila stated David Davila/[facility] has not responded to phone calls, fax, or email. TTS explained the patient was ready for discharge yesterday and needs to be picked up. David Davila stated she will return call when she has more information.

## 2019-04-19 NOTE — ED Notes (Addendum)
Pt refused sandwich tray.  Given chips and drink

## 2019-04-19 NOTE — ED Notes (Signed)
Hourly rounding reveals patient sleeping in room. No complaints, stable, in no acute distress. Q15 minute rounds and monitoring via Security Cameras to continue. 

## 2019-04-19 NOTE — ED Notes (Signed)
VOL/Pending group home pickup

## 2019-04-19 NOTE — ED Notes (Signed)
Pt given meal tray.

## 2019-04-19 NOTE — ED Notes (Signed)
Report to include Situation, Background, Assessment, and Recommendations received from Amy B. RN. Patient alert and oriented, warm and dry, in no acute distress. Patient denies SI, HI, AVH and pain. Patient made aware of Q15 minute rounds and security cameras for their safety. Patient instructed to come to me with needs or concerns. 

## 2019-04-19 NOTE — ED Notes (Signed)
Hourly rounding reveals patient in room. No complaints, stable, in no acute distress. Q15 minute rounds and monitoring via Security Cameras to continue. 

## 2019-04-19 NOTE — ED Notes (Signed)
Pt ambulated to bathroom.  Asked for OJ.  Sitting quietly on bed

## 2019-04-19 NOTE — ED Notes (Signed)
Patient asked RN to contact his probation officer (Henning). RN left a voicemail requesting a return call from Patrica Duel.  [phone])

## 2019-04-19 NOTE — ED Provider Notes (Signed)
-----------------------------------------   9:00 AM on 04/19/2019 -----------------------------------------   Blood pressure (!) 128/56, pulse 83, temperature 98.4 F (36.9 C), temperature source Oral, resp. rate 16, height 5\' 6"  (1.676 m), weight 63.5 kg, SpO2 99 %.  The patient is calm and cooperative at this time.  There have been no acute events since the last update.  Awaiting disposition plan from Behavioral Medicine and/or Social Work team(s).    Duffy Bruce, MD 04/19/19 0900

## 2019-04-20 MED ORDER — PALIPERIDONE PALMITATE ER 234 MG/1.5ML IM SUSY
234.0000 mg | PREFILLED_SYRINGE | Freq: Once | INTRAMUSCULAR | Status: AC
  Administered 2019-04-20: 234 mg via INTRAMUSCULAR
  Filled 2019-04-20: qty 1.5

## 2019-04-20 MED ORDER — INVEGA SUSTENNA 156 MG/ML IM SUSY: 156.0000 mg | PREFILLED_SYRINGE | Freq: Once | INTRAMUSCULAR | 0 refills | Status: DC

## 2019-04-20 MED ORDER — PALIPERIDONE PALMITATE ER 156 MG/ML IM SUSY: 156.0000 mg | PREFILLED_SYRINGE | INTRAMUSCULAR | Status: DC

## 2019-04-20 NOTE — Consult Note (Signed)
Patient is to be restarted on Invega S[place]enna. An Invega 234 mg IM injection today, then 156 mg IM injection in 7 days and then 156 mg IM injection every 28 days. Discontinue Invega oral after giving IM injection.

## 2019-04-20 NOTE — ED Notes (Signed)
Hourly rounding reveals patient sleeping in room. No complaints, stable, in no acute distress. Q15 minute rounds and monitoring via Security Cameras to continue. 

## 2019-04-20 NOTE — ED Notes (Signed)
Pt provided drink at this time per request

## 2019-04-20 NOTE — ED Notes (Signed)
Pt provided dinner tray at this time.

## 2019-04-20 NOTE — BH Assessment (Signed)
Writer spoke with patient's [employer] [place] guardian [name] [phone]). Informed her the patient was discharged and he received his [place] injection and have a hard copy of his prescriptions to take with him. She will set up his follow-up appointment with [facility]. She want to ensure the patient have a face to face appointment instead of virtual.

## 2019-04-20 NOTE — ED Provider Notes (Signed)
-----------------------------------------   7:36 AM on 04/20/2019 -----------------------------------------  Blood pressure 90/62, pulse 70, temperature 98.6 F (37 C), temperature source Oral, resp. rate 16, height 5\' 6"  (1.676 m), weight 63.5 kg, SpO2 97 %.  The patient is calm and cooperative at this time.  There have been no acute events since the last update.  Has been cleared for discharge from a psychiatric perspective. Awaiting transport back to group home.    Lilia Pro., MD 04/20/19 [phone]

## 2019-04-20 NOTE — ED Provider Notes (Signed)
Procedures     ----------------------------------------- 6:28 PM on 04/20/2019 -----------------------------------------   Psychiatry is restarted the patient on [place], had his initial injection 1 week ago.  They recommend a repeat injection 1 week from now of 156 mg IM.  Recent CBC has shown a stable white blood cell count.  I have written a prescription for this so the patient can obtain this medicine when needed.   Carrie Mew, MD 04/20/19 [phone]

## 2019-04-20 NOTE — ED Notes (Signed)
Meal tray given 

## 2019-04-20 NOTE — ED Notes (Signed)
Report to include Situation, Background, Assessment, and Recommendations received from Jordan RN. Patient alert and oriented, warm and dry, in no acute distress. Patient denies SI, HI, AVH and pain. Patient made aware of Q15 minute rounds and security cameras for their safety. Patient instructed to come to me with needs or concerns. 

## 2019-04-20 NOTE — ED Notes (Signed)
Hourly rounding reveals patient in room. No complaints, stable, in no acute distress. Q15 minute rounds and monitoring via Security Cameras to continue. 

## 2019-04-20 NOTE — ED Notes (Signed)
Patient refused shower. 

## 2019-04-20 NOTE — ED Notes (Signed)
Attempted to [employer] for patient. Went to [employer]. Will try again. [employer] Office: [phone].

## 2019-04-21 NOTE — ED Notes (Signed)
Hourly rounding reveals patient sleeping in room. No complaints, stable, in no acute distress. Q15 minute rounds and monitoring via Security Cameras to continue. 

## 2019-04-21 NOTE — ED Notes (Signed)
VOL/Pending Group Home pick up

## 2019-04-21 NOTE — ED Notes (Signed)
Spoke to [employer] patients guardian and she stated she will be here around 12 noon to pick him up

## 2019-04-21 NOTE — ED Notes (Signed)
Patient discharged home with his legal guardian [name], patient and Ms. Scott received discharge papers and prescriptions. Patient received belongings and verbalized he has received all of his belongings. Patient appropriate and cooperative, Denies SI/HI AVH. Vital signs taken. NAD noted.

## 2019-04-21 NOTE — ED Notes (Signed)
Patient eating lunch tray.

## 2019-04-21 NOTE — ED Provider Notes (Signed)
-----------------------------------------   4:26 AM on 04/21/2019 -----------------------------------------   Blood pressure 100/65, pulse 77, temperature 98.3 F (36.8 C), temperature source Oral, resp. rate 16, height 1.676 m (5\' 6" ), weight 63.5 kg, SpO2 98 %.  The patient is sleeping at this time.  Apparently the patient has been discharged but is awaiting a ride.   Hinda Kehr, MD 04/21/19 [phone]

## 2019-04-21 NOTE — ED Notes (Signed)
Hourly rounding reveals patient in room. No complaints, stable, in no acute distress. Q15 minute rounds and monitoring via Security Cameras to continue.Snack and beverage given. 

## 2019-04-21 NOTE — ED Notes (Signed)
Patient refused shower. 

## 2021-02-06 ENCOUNTER — Encounter (HOSPITAL_COMMUNITY): Payer: Self-pay | Admitting: Emergency Medicine

## 2021-02-06 ENCOUNTER — Emergency Department (HOSPITAL_COMMUNITY)
Admission: EM | Admit: 2021-02-06 | Discharge: 2021-02-08 | Disposition: A | Discharge status: Home / Self Care | Payer: Medicare Other | Attending: Emergency Medicine | Admitting: Emergency Medicine

## 2021-02-06 ENCOUNTER — Other Ambulatory Visit: Payer: Self-pay

## 2021-02-06 DIAGNOSIS — R451 Restlessness and agitation: Secondary | ICD-10-CM | POA: Diagnosis not present

## 2021-02-06 DIAGNOSIS — Z79899 Other long term (current) drug therapy: Secondary | ICD-10-CM | POA: Insufficient documentation

## 2021-02-06 DIAGNOSIS — R259 Unspecified abnormal involuntary movements: Secondary | ICD-10-CM | POA: Insufficient documentation

## 2021-02-06 DIAGNOSIS — Z046 Encounter for general psychiatric examination, requested by authority: Secondary | ICD-10-CM | POA: Diagnosis present

## 2021-02-06 DIAGNOSIS — F17210 Nicotine dependence, cigarettes, uncomplicated: Secondary | ICD-10-CM | POA: Diagnosis not present

## 2021-02-06 DIAGNOSIS — F203 Undifferentiated schizophrenia: Secondary | ICD-10-CM | POA: Insufficient documentation

## 2021-02-06 LAB — CBC WITH DIFFERENTIAL/PLATELET
Abs Immature Granulocytes: 0.01 10*3/uL (ref 0.00–0.07)
Basophils Absolute: 0 10*3/uL (ref 0.0–0.1)
Basophils Relative: 1 %
Eosinophils Absolute: 0.1 10*3/uL (ref 0.0–0.5)
Eosinophils Relative: 1 %
HCT: 44.4 % (ref 39.0–52.0)
Hemoglobin: 14.5 g/dL (ref 13.0–17.0)
Immature Granulocytes: 0 %
Lymphocytes Relative: 19 %
Lymphs Abs: 1.2 10*3/uL (ref 0.7–4.0)
MCH: 30.9 pg (ref 26.0–34.0)
MCHC: 32.7 g/dL (ref 30.0–36.0)
MCV: 94.5 fL (ref 80.0–100.0)
Monocytes Absolute: 0.4 10*3/uL (ref 0.1–1.0)
Monocytes Relative: 6 %
Neutro Abs: 4.7 10*3/uL (ref 1.7–7.7)
Neutrophils Relative %: 73 %
Platelets: 203 10*3/uL (ref 150–400)
RBC: 4.7 MIL/uL (ref 4.22–5.81)
RDW: 13.3 % (ref 11.5–15.5)
WBC: 6.4 10*3/uL (ref 4.0–10.5)
nRBC: 0 % (ref 0.0–0.2)

## 2021-02-06 LAB — COMPREHENSIVE METABOLIC PANEL
ALT: 85 U/L — ABNORMAL HIGH (ref 0–44)
AST: 43 U/L — ABNORMAL HIGH (ref 15–41)
Albumin: 4.5 g/dL (ref 3.5–5.0)
Alkaline Phosphatase: 82 U/L (ref 38–126)
Anion gap: 12 (ref 5–15)
BUN: 15 mg/dL (ref 6–20)
CO2: 24 mmol/L (ref 22–32)
Calcium: 9.4 mg/dL (ref 8.9–10.3)
Chloride: 102 mmol/L (ref 98–111)
Creatinine, Ser: 0.87 mg/dL (ref 0.61–1.24)
GFR, Estimated: >60 mL/min (ref >60)
Glucose, Bld: 81 mg/dL (ref 70–99)
Potassium: 4.2 mmol/L (ref 3.5–5.1)
Sodium: 138 mmol/L (ref 135–145)
Total Bilirubin: 2.1 mg/dL — ABNORMAL HIGH (ref 0.3–1.2)
Total Protein: 8.3 g/dL — ABNORMAL HIGH (ref 6.5–8.1)

## 2021-02-06 LAB — ACETAMINOPHEN LEVEL: Acetaminophen (Tylenol), Serum: <10 ug/mL — ABNORMAL LOW (ref 10–30)

## 2021-02-06 LAB — ETHANOL: Alcohol, Ethyl (B): <10 mg/dL (ref <10)

## 2021-02-06 LAB — SALICYLATE LEVEL: Salicylate Lvl: <7 mg/dL — ABNORMAL LOW (ref 7.0–30.0)

## 2021-02-06 MED ORDER — OLANZAPINE 5 MG PO TBDP
10.0000 mg | ORAL_TABLET | Freq: Two times a day (BID) | ORAL | Status: DC
  Administered 2021-02-06 – 2021-02-08 (×4): 10 mg via ORAL
  Filled 2021-02-06 (×4): qty 2

## 2021-02-06 MED ORDER — MIRTAZAPINE 15 MG PO TABS
15.0000 mg | ORAL_TABLET | Freq: Every day | ORAL | Status: DC
  Administered 2021-02-06 – 2021-02-07 (×2): 15 mg via ORAL
  Filled 2021-02-06 (×2): qty 1

## 2021-02-06 MED ORDER — BENZTROPINE MESYLATE 1 MG PO TABS
0.5000 mg | ORAL_TABLET | Freq: Two times a day (BID) | ORAL | Status: DC
  Administered 2021-02-06 – 2021-02-08 (×4): 0.5 mg via ORAL
  Filled 2021-02-06 (×4): qty 1

## 2021-02-06 MED ORDER — HALOPERIDOL 5 MG PO TABS
5.0000 mg | ORAL_TABLET | Freq: Two times a day (BID) | ORAL | Status: DC
  Administered 2021-02-06 – 2021-02-08 (×4): 5 mg via ORAL
  Filled 2021-02-06 (×4): qty 1

## 2021-02-06 NOTE — ED Notes (Signed)
Taken over care of this pt at 2300. Pt in room alert and calm at this time

## 2021-02-06 NOTE — ED Notes (Signed)
Pt changed into scrubs and wanded by security  

## 2021-02-06 NOTE — ED Notes (Signed)
Pt changed out to burgundy scrubs per protocol without difficulty. Personal belongings locked in locker.

## 2021-02-06 NOTE — ED Notes (Signed)
Pt refusing EKG, blood draws and any testing. Pt verbalized he is not a machine and he will not be hooked up to one. Pt said he is fine and he doesn't need to draw blood to prove he his fine. Pt believes taking medications would make a baby. Putting things in his mouth makes a baby. Pt states he does not eat meat and group home was trying to make him eat meat. Per Pt. " Love means- before anything was awake there wasn't anything awake and everything was alright. "

## 2021-02-06 NOTE — ED Triage Notes (Signed)
Here for medication refill until he can get established with [facility]. Discharged to group home with only 5 days supply of meds

## 2021-02-06 NOTE — ED Provider Notes (Signed)
[facility] EMERGENCY DEPARTMENT Provider Note   CSN: [id number] Arrival date & time: 02/06/21  1526     History No chief complaint on file.   David Llanas. is a 40 y.o. male with a past medical history of antisocial personality disorder, schizophrenia, who presents today for evaluation of agitation. He discharged from June on June 16 with limited amount of medications he has been out of his meds since the 21st.  Today he was brought in by group home owner as he became agitated, was throwing things. I spoke with patient's legal guardian through [facility] who states that patient has a history of being very psychotic, disappearing for weeks to months and being aggressive when he is not medicated.  He notes that if patient was to attempt to try and leave he does not think that this is a safe disposition for patient to leave on his own unmedicated.  Patient denies SI, HI, AVH.  He is calm with tangential thought process however rapidly gets agitated when he hears me speaking with his group home support person about the events leading up to today.  Patient makes bizarre statements including that he is not a machine so he will not be hooked up to 1 in reference to checking vitals, he states that part of his issue was he does not eat meat and the group home was trying to make him eat meat.    Chart review shows prior IVC and history of being violent towards group home staff when unmedicated.   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 No pertinent surgical history.     History reviewed. No pertinent family history.  Social History   Tobacco Use   Smoking status: Every Day    Packs/day: 1.00    Pack years: 0.00    Types: Cigarettes   Smokeless tobacco: Never  Substance Use Topics   Alcohol use: No   Drug use: Not Currently    Home Medications Prior to Admission medications   Medication Sig Start Date End Date Taking? Authorizing Provider  benztropine (COGENTIN) 1 MG tablet Take 1 tablet (1 mg total) by mouth at bedtime. Patient taking differently: Take 0.5 mg by mouth every 12 (twelve) hours. 04/17/19  Yes Charm Rings, NP  haloperidol (HALDOL) 5 MG tablet Take 1 tablet (5 mg total) by mouth at bedtime. Patient taking differently: Take 5 mg by mouth every 12 (twelve) hours. 04/17/19  Yes Charm Rings, NP  mirtazapine (REMERON) 15 MG tablet Take 15 mg by mouth at bedtime.   Yes [provider]  OLANZapine zydis (ZYPREXA) 10 MG disintegrating tablet Take 10 mg by mouth in the morning and at bedtime.   Yes [provider]  atorvastatin (LIPITOR) 10 MG tablet Take 1 tablet (10 mg total) by mouth daily at 6 PM.  Patient not taking: No sig reported 04/17/19   Charm RingsLord, Jamison Y, NP  divalproex (DEPAKOTE) 500 MG DR tablet Take 2 tablets (1,000 mg total) by mouth every morning. Patient not taking: No sig reported 04/17/19   Charm RingsLord, Jamison Y, NP  OLANZapine (ZYPREXA) 10 MG tablet 2 (two) times daily. Patient not taking: Reported on 02/06/2021 03/18/16   [provider]  paliperidone (INVEGA SUSTENNA) 156 MG/ML SUSY injection Inject 1 mL (156 mg total) into the muscle once for 1 dose. Patient not taking: Reported on 02/06/2021 04/27/19 04/27/19  Sharman CheekStafford, Phillip, MD  paliperidone [facility](INVEGA SUSTENNA) 234 MG/1.5ML SUSY  injection Inject 234 mg into the muscle once for 1 dose. Next injection due 05/27/18 Patient not taking: Reported on 02/06/2021 09/02/18 09/02/18  Willy Eddyobinson, Patrick, MD  paliperidone (INVEGA) 6 MG 24 hr tablet Take 1 tablet (6 mg total) by mouth daily. Patient not taking: No sig reported 04/18/19   Charm RingsLord, Jamison Y, NP  traZODone (DESYREL) 100 MG tablet Take 1 tablet (100 mg total) by mouth at bedtime as needed for sleep. Patient not taking: No sig reported 04/17/19   Charm RingsLord, Jamison Y, NP    Allergies    Hinda GlatterInvega sustenna [paliperidone palmitate er] and Zyprexa [olanzapine]  Review of Systems   Review of Systems  Constitutional:  Negative for chills and fever.  Respiratory:  Negative for shortness of breath.   Cardiovascular:  Negative for chest pain.  Gastrointestinal:  Negative for abdominal pain.  Skin:  Negative for color change.  Neurological:  Negative for weakness and headaches.  Psychiatric/Behavioral:  Positive for agitation, behavioral problems and dysphoric mood. Negative for suicidal ideas. The patient is not nervous/anxious.   All other systems reviewed and are negative.  Physical Exam Updated Vital Signs BP 121/69   Pulse (!) 113   Temp 98.1 F (36.7 C)   Resp 20   Ht 5\' 6"  (1.676 m)   Wt 63.5 kg   SpO2 96%   BMI 22.60 kg/m   Physical Exam Vitals and nursing note reviewed.  Constitutional:      General: He is not in acute distress.    Appearance: He is not diaphoretic.  HENT:     Head: Normocephalic and atraumatic.  Eyes:     General: No scleral icterus.       Right eye: No discharge.        Left eye: No discharge.     Conjunctiva/sclera: Conjunctivae normal.  Cardiovascular:     Rate and Rhythm: Normal rate and regular rhythm.  Pulmonary:     Effort: Pulmonary effort is normal. No respiratory distress.     Breath sounds: No stridor.  Abdominal:     General: There is no distension.  Musculoskeletal:        General: No deformity.     Cervical back: Normal  range of motion.  Skin:    General: Skin is warm and dry.  Neurological:     Mental Status: He is alert.     Motor: No abnormal muscle tone.     Comments: Patient is awake and alert, he is oriented to person, place, and time.  Is all 4 extremities spontaneously.  Speech is not slurred.  Psychiatric:     Comments: Patient is calm  until he overhears me speaking with his group home staff and then he gets very agitated.  He makes bizarre statements and is noncooperative with vitals, changing out.      ED Results / Procedures / Treatments  Labs (all labs ordered are listed, but only abnormal results are displayed) Labs Reviewed  COMPREHENSIVE METABOLIC PANEL - Abnormal; Notable for the following components:      Result Value   Total Protein 8.3 (*)    AST 43 (*)    ALT 85 (*)    Total Bilirubin 2.1 (*)    All other components within normal limits  SALICYLATE LEVEL - Abnormal; Notable for the following components:   Salicylate Lvl <7.0 (*)    All other components within normal limits  ACETAMINOPHEN LEVEL - Abnormal; Notable for the following components:   Acetaminophen (Tylenol), Serum <10 (*)    All other components within normal limits  RESP PANEL BY RT-PCR (FLU A&B, COVID) ARPGX2  ETHANOL  CBC WITH DIFFERENTIAL/PLATELET  RAPID URINE DRUG SCREEN, HOSP PERFORMED    EKG None  Radiology No results found.  Procedures Procedures   Medications Ordered in ED Medications  mirtazapine (REMERON) tablet 15 mg (15 mg Oral Given 02/06/21 2216)  benztropine (COGENTIN) tablet 0.5 mg (0.5 mg Oral Given 02/06/21 2215)  haloperidol (HALDOL) tablet 5 mg (5 mg Oral Given 02/06/21 2215)  OLANZapine zydis (ZYPREXA) disintegrating tablet 10 mg (10 mg Oral Given 02/06/21 2214)    ED Course  I have reviewed the triage vital signs and the nursing notes.  Pertinent labs & imaging results that were available during my care of the patient were reviewed by me and considered in my medical decision  making (see chart for details).  Clinical Course as of 02/07/21 0002  Tue Feb 06, 2021  1652 I had extensive discussion with patient's legal guardian David Davila.  He states that he has full guardian ship over patient. He does not feel like allowing patient to leave unaccompanied is a safe choice for patient as he disappears for months.  Patient has been off his meds for 7 days and per guardian will become very psychotic with out them.   [EH]  1726 IVC papers filled out by Dr. Estell Harpin, given to secretary.  Patients RN is aware.  [EH]  1812 I was informed that patient is refusing all orders.   [EH]    Clinical Course User Index [EH] Norman Clay   MDM Rules/Calculators/A&P                         Patient is a 40 year old man with a past medical history of schizophrenia, antisocial personality disorder, who presents today for evaluation of agitation at the group home.  He was released from prison with a limited supply of medications on the 16th and ran out on the 21st and has been off his meds since then. Patient has a legal guardian with [facility]. I spoke with his legal guardian in anticipation of patient not being cooperative based on his behaviors and actions in the emergency room.  His guardian states that he does not feel it is safe for patient to be allowed to elope or leave the emergency room unaccompanied.  He states that patient has a history of being violent when he is not medicated along with a history of disappearing for weeks to months when unmedicated.  Given this, combined with patient reportedly throwing things earlier and labile nature with tangential thoughts IVC paperwork is completed.  Patient's acetaminophen and salicylate levels are undetected.  Ethanol is undetected.  UDS and COVID testing is pending at this time.  CMP does show total bili is slightly elevated at 2.1,  patient is not complaining of any abdominal pain or similar symptoms.  CBC is unremarkable.  He was  tachycardic earlier however is refused additional vital signs.  At this time patient is under full IVC.  TTS evaluation was ordered.  Note: Portions of this report may have been transcribed using voice recognition software. Every effort was made to ensure accuracy; however, inadvertent computerized transcription errors may be present   Final Clinical Impression(s) / ED Diagnoses Final diagnoses:  Involuntary commitment  Agitation  Undifferentiated schizophrenia [facility])    Rx / DC Orders ED Discharge Orders     None        Norman Clay 02/07/21 Rhea Belton, MD 02/09/21 1030

## 2021-02-06 NOTE — ED Notes (Signed)
Unable to obtain vitals due to pt resting.

## 2021-02-06 NOTE — ED Triage Notes (Signed)
Finishing pts triage.  During pts assessment, pt overheard caregiver and PA talking and became upset.

## 2021-02-07 NOTE — ED Notes (Addendum)
Patient refusing to be telepsyched at this time due to patient stating he does not want to speak with anyone on a computer screen. Patient states he does not want to talk unless it is to an actual person. Patient educated on IVC and cooperation and patient refuses psych assessment.

## 2021-02-07 NOTE — ED Notes (Signed)
Report received from [place], Charity fundraiser. Patient lying on right side with eyes closed. Respirations even and unlabored. NAD noted

## 2021-02-07 NOTE — BH Assessment (Signed)
Comprehensive Clinical Assessment (CCA) Note  02/07/2021 David Davila [id number]  Disposition: Liborio Nixon, NP, recommends overnight observation for safety and stabilization with reassessment in the AM to establish safety plan for possible discharge. Caitlyn, RN, informed of disposition.  The patient demonstrates the following risk factors for suicide: Chronic risk factors for suicide include: psychiatric disorder of hx of schizophrenia . Acute risk factors for suicide include: family or marital conflict, unemployment, and loss (financial, interpersonal, professional). Protective factors for this patient include: coping skills. Considering these factors, the overall suicide risk at this point appears to be moderate. Patient is not appropriate for outpatient follow up.  Flowsheet Row ED from 02/06/2021 in [facility] EMERGENCY DEPARTMENT ED from 04/16/2019 in [facility] EM[facility]GENCY DEPARTMENT Admission (Discharged) from 03/26/2018 in [facility] INPATIENT BEHAVIORAL MEDICINE  C-SSRS RISK CATEGORY No Risk No Risk No Risk      1:1  David Davila. Is a 40 year old male presenting under IVC to [facility] due to agitation. Patient was brought in by group home owner as he became agitated and was throwing things. Patient is under IVC by attending EDP-- per IVC, patient has a history of Schizophrenia and is currently not taking medication. Patient was agitated and aggressive at his group home.   When TTS clinician asked, why are you here, patient stated "I don't know". Patient denied prior suicide attempts and self-harming behaviors. Per medical record, patient was inpatient for psych 03/2018. Patient reported being released from prison 1 1/2 weeks ago and then being taken to current Independent Living Home. During assessment patient requesting to go back to his hometown to live in his fathers home. Patient provided limited information and answered some assessment questions with "I don't  know". Patient was pleasant and cooperative during assessment.   PER EDP NOTE 02/06/2021 I spoke with patient's legal guardian through [facility] who states that patient has a history of being very psychotic, disappearing for weeks to months and being aggressive when he is not medicated.  He notes that if patient was to attempt to try and leave he does not think that this is a safe disposition for patient to leave on his own unmedicated.    Collateral contact Per medical record, Bertram Millard, legal guardian [phone]. Unable to contact, will attempt at later time.   Chief Complaint:  Chief Complaint  Patient presents with   Psychiatric Evaluation   Visit Diagnosis:  Hx of Schizophrenia   CCA Biopsychosocial Patient Reported Schizophrenia/Schizoaffective Diagnosis in Past: No data recorded  Strengths: Self-awareness  Mental Health Symptoms Depression:   None   Duration of Depressive symptoms:    Mania:   None   Anxiety:    None   Psychosis:   None   Duration of Psychotic symptoms:    Trauma:   None   Obsessions:   None   Compulsions:   None   Inattention:   None   Hyperactivity/Impulsivity:   None   Oppositional/Defiant Behaviors:   None   Emotional Irregularity:   None   Other Mood/Personality Symptoms:  No data recorded   Mental Status Exam Appearance and self-care  Stature:   Average   Weight:   Average weight   Clothing:   Neat/clean   Grooming:   Normal   Cosmetic use:   None   Posture/gait:   Normal   Motor activity:   Not Remarkable   Sensorium  Attention:   Normal   Concentration:   Normal  Orientation:   X5   Recall/memory:   Normal   Affect and Mood  Affect:   Appropriate   Mood:   Depressed   Relating  Eye contact:   Normal   Facial expression:   Sad   Attitude toward examiner:   Cooperative   Thought and Language  Speech flow:  Slow; Soft   Thought content:   Appropriate to Mood and  Circumstances   Preoccupation:   None   Hallucinations:   None   Organization:  No data recorded  [employer] of Knowledge:   Average   Intelligence:   Average   Abstraction:   Normal   Judgement:   Poor   Reality Testing:  No data recorded  Insight:   Poor; Gaps   Decision Making:   Confused   Social Functioning  Social Maturity:  No data recorded  Social Judgement:  No data recorded  Stress  Stressors:   Family conflict; Relationship   Coping Ability:   Overwhelmed; Exhausted   Skill Deficits:   Self-control; Decision making   Supports:   Support needed    Religion:   Leisure/Recreation: Leisure / Recreation Do You Have Hobbies?: Yes Leisure and Hobbies: "thinking"  Exercise/Diet: Exercise/Diet Do You Follow a Special Diet?: Yes Type of Diet: Vegan Do You Have Any Trouble Sleeping?: Yes Explanation of Sleeping Difficulties: "I don't know"  CCA Employment/Education Employment/Work Situation: Employment / Work Situation Employment Situation: On disability Patient's Job has Been Impacted by Current Illness: Yes Has Patient ever Been in Equities trader?: No  Education: Education Is Patient Currently Attending School?: No Last Grade Completed: 12 Did You Product manager?: No  CCA Family/Childhood History Family and Relationship History: Family history Does patient have children?: No  Childhood History:  Childhood History By whom was/is the patient raised?: Father Did patient suffer any verbal/emotional/physical/sexual abuse as a child?: No Has patient ever been sexually abused/assaulted/raped as an adolescent or adult?: No Witnessed domestic violence?: No Has patient been affected by domestic violence as an adult?: No  Child/Adolescent Assessment:   CCA Substance Use Alcohol/Drug Use: Alcohol / Drug Use Pain Medications: See MAR Prescriptions: See MAR Over the Counter: See MAR History of alcohol / drug use?: Yes  (uta) Longest period of sobriety (when/how long): uta   ASAM's:  Six Dimensions of Multidimensional Assessment  Dimension 1:  Acute Intoxication and/or Withdrawal Potential:      Dimension 2:  Biomedical Conditions and Complications:      Dimension 3:  Emotional, Behavioral, or Cognitive Conditions and Complications:     Dimension 4:  Readiness to Change:     Dimension 5:  Relapse, Continued use, or Continued Problem Potential:     Dimension 6:  Recovery/Living Environment:     ASAM Severity Score:    ASAM Recommended Level of Treatment:     Substance use Disorder (SUD)   Recommendations for Services/Supports/Treatments:   Discharge Disposition:   DSM5 Diagnoses: Patient Active Problem List   Diagnosis Date Noted   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   TBI (traumatic brain injury) (HCC) 07/10/2016   Vitamin B12 deficiency 07/10/2016   Noncompliance with medication regimen 06/25/2016   Cocaine use disorder, mild, abuse (HCC) 06/25/2016   Cannabis use disorder, mild, 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

## 2021-02-07 NOTE — ED Notes (Signed)
Assisted pt to bathroom

## 2021-02-07 NOTE — ED Notes (Signed)
Patient's food tray sent up with meat on the tray again for dinner. New tray requested.

## 2021-02-07 NOTE — ED Notes (Signed)
Pt dinner tray is at bedside

## 2021-02-07 NOTE — ED Notes (Signed)
Pt breakfast was given to him at bedside

## 2021-02-07 NOTE — ED Notes (Signed)
Attempted to due vitals. Pt refused

## 2021-02-07 NOTE — ED Notes (Signed)
Kitchen brought patient egg salad sandwich. Dietary called due to patient's specific vegan diet and a new tray will be brought up.

## 2021-02-07 NOTE — BH Assessment (Addendum)
Attempted assessment.  Pt refused to participate because he does not want to talk with a computer and will only talk with someone in person.   Pt is under IVC by attending EDP-- per IVC, Pt has a history of Schizophrenia and is currently not taking medication.  Pt was agitated and aggressive at his group home.

## 2021-02-08 MED ORDER — HALOPERIDOL 5 MG PO TABS: 5.0000 mg | ORAL_TABLET | Freq: Two times a day (BID) | ORAL | 0 refills | Status: DC

## 2021-02-08 MED ORDER — OLANZAPINE 10 MG PO TABS: 10.0000 mg | ORAL_TABLET | Freq: Two times a day (BID) | ORAL | 0 refills | Status: DC

## 2021-02-08 MED ORDER — TRAZODONE HCL 100 MG PO TABS: 100.0000 mg | ORAL_TABLET | Freq: Every evening | ORAL | 0 refills | Status: DC | PRN

## 2021-02-08 MED ORDER — HALOPERIDOL 5 MG PO TABS: 5.0000 mg | ORAL_TABLET | Freq: Every day | ORAL | 0 refills | Status: DC

## 2021-02-08 MED ORDER — MIRTAZAPINE 15 MG PO TABS: 15.0000 mg | ORAL_TABLET | Freq: Every day | ORAL | 0 refills | Status: DC

## 2021-02-08 MED ORDER — OLANZAPINE 10 MG PO TBDP: ORAL_TABLET | ORAL | 0 refills | Status: DC

## 2021-02-08 MED ORDER — BENZTROPINE MESYLATE 0.5 MG PO TABS: 1.0000 mg | ORAL_TABLET | Freq: Two times a day (BID) | ORAL | 0 refills | Status: DC

## 2021-02-08 MED ORDER — BENZTROPINE MESYLATE 0.5 MG PO TABS: 0.5000 mg | ORAL_TABLET | Freq: Two times a day (BID) | ORAL | 0 refills | Status: DC

## 2021-02-08 MED ORDER — DIVALPROEX SODIUM 500 MG PO DR TAB: 1000.0000 mg | DELAYED_RELEASE_TABLET | ORAL | 0 refills | Status: DC

## 2021-02-08 MED ORDER — ATORVASTATIN CALCIUM 10 MG PO TABS: 10.0000 mg | ORAL_TABLET | Freq: Every day | ORAL | 0 refills | Status: DC

## 2021-02-08 NOTE — ED Provider Notes (Signed)
Emergency Medicine Observation Re-evaluation Note  David Davila. is a 40 y.o. male, seen on rounds today.  Pt initially presented to the ED for complaints of Psychiatric Evaluation Currently, the patient is awaiting TTS re-evaluation. Currently under IVC from group home.  Physical Exam  BP 99/74   Pulse 62   Temp 98.2 F (36.8 C) (Oral)   Resp 18   Ht 5\' 6"  (1.676 m)   Wt 63.5 kg   SpO2 100%   BMI 22.60 kg/m  Physical Exam General: Calm, cooperative and ambulatory without difficulty.  Cardiac: Well perfused.  Lungs: Even, unlabored respirations.  Psych: Cooperative.   ED Course / MDM  EKG:   I have reviewed the labs performed to date as well as medications administered while in observation.  Recent changes in the last 24 hours include TTS evaluation. Patient is under IVC at this time.   Discussed case with , NP with TTS. They feel the patient is safe for discharge. Requesting medication to be dispensed at the time of discharge which we are unable to do.  Patient does have refills on his medications at the pharmacy.  I have rescinded the IVC paperwork.  Patient to return to his group home.   Plan  Current plan is for Re-evaluation this AM. Patient is under full IVC at this time.   Dorena Bodo, MD 02/08/21 1329

## 2021-02-08 NOTE — ED Notes (Signed)
Patient being assessed by TTS at this time.  

## 2021-02-08 NOTE — Consult Note (Signed)
Telepsych Consultation   Reason for Consult:  psychiatric evaluation Referring Physician:  Dr. Jacqulyn BathLong, EDP Location of Patient: [facility]  Location of Provider: [facility]  Patient Identification: David Mulleraul Stephenson Armon Jr. MRN:  [id number] Principal Diagnosis: <principal problem not specified> Diagnosis:  Active Problems:   * No active hospital problems. *   Total Time spent with patient: 30 minutes  Subjective:   David Mulleraul Stephenson Parkerson Jr. is a 40 y.o. male patient admitted with per admit note:   David DaviesPaul Difabio Jr. Is a 40 year old male presenting under IVC to [facility] due to agitation. Patient was brought in by group home owner as he became agitated and was throwing things. Patient is under IVC by attending EDP-- per IVC, patient has a history of Schizophrenia and is currently not taking medication. Patient was agitated and aggressive at his group home.[name].  HPI:  David Davila, is 40 y.o male, seen by this provider via tele psyche, while in the emergency department at [facility].  He is alert, oriented to place, refuses to give me his last name reports "I do not have a last name, I was adopted "reports his name is David Davila.   He is lying calmly on the emergency room stretcher, cooperative with interview reports that "I feel fine nothing is wrong with me "speech slow, volume soft.  Denies suicidal ideation, no intent no plan, denies homicidal ideation, denies auditory and visual hallucinations.  He is able to contract for safety.  He does not appear to be responding to internal or external stimuli.  He denies past suicide attempts, reports he does not have access to a gun.  Reports "I am not going to hurt myself "when asked about living arrangements, "I do not have a house "I am living in someone else's house "reports that he does have a psychiatrist, does not know the name.  Reports that he just got out of prison and he does not have a therapist.  Is unaware of his current medications, reports that  they forced me to take medications.  Past Psychiatric History: schizophrenia  Risk to Self:  no Risk to Others:  no Prior Inpatient Therapy:   Prior Outpatient Therapy:    Past Medical History:  Past Medical History:  Diagnosis Date   Acute psychosis (HCC)    Antisocial personality disorder (HCC)    Cannabis abuse    Past   Paranoid schizophrenia (HCC)    Schizoaffective disorder, bipolar type (HCC)    History reviewed. No pertinent surgical history. Family History: History reviewed. No pertinent family history. Family Psychiatric  History:  Social History: recently released from jail.  Social History   Substance and Sexual Activity  Alcohol Use No     Social History   Substance and Sexual Activity  Drug Use Not Currently    Social History   Socioeconomic History   Marital status: Single    Spouse name: Not on file   Number of children: Not on file   Years of education: Not on file   Highest education level: Not on file  Occupational History   Not on file  Tobacco Use   Smoking status: Every Day    Packs/day: 1.00    Pack years: 0.00    Types: Cigarettes   Smokeless tobacco: Never  Substance and Sexual Activity   Alcohol use: No   Drug use: Not Currently   Sexual activity: Not Currently  Other Topics Concern   Not on file  Social  History Narrative   Not on file   Social Determinants of Health   Financial Resource Strain: Not on file  Food Insecurity: Not on file  Transportation Needs: Not on file  Physical Activity: Not on file  Stress: Not on file  Social Connections: Not on file   Additional Social History:    Allergies:   Allergies  Allergen Reactions   Invega Sustenna [Paliperidone Palmitate Er] Other (See Comments)    Caused drop in WBC and neutropenia following injection in August 2019 (he was not on other medications that could have caused this drop). Has tolerated it in the past (with no drop in WBC). Repeat CBC a few days later in  August then showed improved ANC and WBC. Would recommend monitoring CBC while on this medication   Zyprexa [Olanzapine] Other (See Comments)    Reaction: unknown    Labs:  Results for orders placed or performed during the hospital encounter of 02/06/21 (from the past 48 hour(s))  Comprehensive metabolic panel     Status: Abnormal   Collection Time: 02/06/21  6:39 PM  Result Value Ref Range   Sodium 138 135 - 145 mmol/L   Potassium 4.2 3.5 - 5.1 mmol/L   Chloride 102 98 - 111 mmol/L   CO2 24 22 - 32 mmol/L   Glucose, Bld 81 70 - 99 mg/dL    Comment: Glucose reference range applies only to samples taken after fasting for at least 8 hours.   BUN 15 6 - 20 mg/dL   Creatinine, Ser 7.62 0.61 - 1.24 mg/dL   Calcium 9.4 8.9 - 26.3 mg/dL   Total Protein 8.3 (H) 6.5 - 8.1 g/dL   Albumin 4.5 3.5 - 5.0 g/dL   AST 43 (H) 15 - 41 U/L   ALT 85 (H) 0 - 44 U/L   Alkaline Phosphatase 82 38 - 126 U/L   Total Bilirubin 2.1 (H) 0.3 - 1.2 mg/dL   GFR, Estimated >33 >54 mL/min    Comment: (NOTE) Calculated using the CKD-EPI Creatinine Equation (2021)    Anion gap 12 5 - 15    Comment: Performed at [facility], [address], [place], [place] [address]  Ethanol     Status: None   Collection Time: 02/06/21  6:39 PM  Result Value Ref Range   Alcohol, Ethyl (B) <10 <10 mg/dL    Comment: (NOTE) Lowest detectable limit for serum alcohol is 10 mg/dL.  For medical purposes only. Performed at [facility], [address] [place], [place] [address]   CBC with Diff     Status: None   Collection Time: 02/06/21  6:39 PM  Result Value Ref Range   WBC 6.4 4.0 - 10.5 K/uL   RBC 4.70 4.22 - 5.81 MIL/uL   Hemoglobin 14.5 13.0 - 17.0 g/dL   HCT 34.2 87.6 - 81.1 %   MCV 94.5 80.0 - 100.0 fL   MCH 30.9 26.0 - 34.0 pg   MCHC 32.7 30.0 - 36.0 g/dL   RDW 57.2 62.0 - 35.5 %   Platelets 203 150 - 400 K/uL   nRBC 0.0 0.0 - 0.2 %   Neutrophils Relative % 73 %   Neutro Abs 4.7 1.7 - 7.7 K/uL   Lymphocytes  Relative 19 %   Lymphs Abs 1.2 0.7 - 4.0 K/uL   Monocytes Relative 6 %   Monocytes Absolute 0.4 0.1 - 1.0 K/uL   Eosinophils Relative 1 %   Eosinophils Absolute 0.1 0.0 - 0.5 K/uL  Basophils Relative 1 %   Basophils Absolute 0.0 0.0 - 0.1 K/uL   Immature Granulocytes 0 %   Abs Immature Granulocytes 0.01 0.00 - 0.07 K/uL    Comment: Performed at [facility], [address] [place], [place] [address]  Salicylate level     Status: Abnormal   Collection Time: 02/06/21  6:39 PM  Result Value Ref Range   Salicylate Lvl <7.0 (L) 7.0 - 30.0 mg/dL    Comment: Performed at [facility], [address] [place], [place] [address]  Acetaminophen level     Status: Abnormal   Collection Time: 02/06/21  6:39 PM  Result Value Ref Range   Acetaminophen (Tylenol), Serum <10 (L) 10 - 30 ug/mL    Comment: (NOTE) Therapeutic concentrations vary significantly. A range of 10-30 ug/mL  may be an effective concentration for many patients. However, some  are best treated at concentrations outside of this range. Acetaminophen concentrations >150 ug/mL at 4 hours after ingestion  and >50 ug/mL at 12 hours after ingestion are often associated with  toxic reactions.  Performed at [facility], [address], [place], [place] [address]     Medications:  Current Facility-Administered Medications  Medication Dose Route Frequency Provider Last Rate Last Admin   benztropine (COGENTIN) tablet 0.5 mg  0.5 mg Oral BID Cristina Gong, PA-C   0.5 mg at 02/08/21 1324   haloperidol (HALDOL) tablet 5 mg  5 mg Oral Q12H Cristina Gong, PA-C   5 mg at 02/08/21 4010   mirtazapine (REMERON) tablet 15 mg  15 mg Oral QHS Cristina Gong, [place]   15 mg at 02/07/21 2113   OLANZapine zydis (ZYPREXA) disintegrating tablet 10 mg  10 mg Oral BID Cristina Gong, PA-C   10 mg at 02/08/21 2725   Current Outpatient Medications  Medication Sig Dispense Refill   benztropine (COGENTIN) 1 MG tablet Take 1 tablet  (1 mg total) by mouth at bedtime. (Patient taking differently: Take 0.5 mg by mouth every 12 (twelve) hours.) 30 tablet 0   haloperidol (HALDOL) 5 MG tablet Take 1 tablet (5 mg total) by mouth at bedtime. (Patient taking differently: Take 5 mg by mouth every 12 (twelve) hours.) 30 tablet 0   mirtazapine (REMERON) 15 MG tablet Take 15 mg by mouth at bedtime.     OLANZapine zydis (ZYPREXA) 10 MG disintegrating tablet Take 10 mg by mouth in the morning and at bedtime.     atorvastatin (LIPITOR) 10 MG tablet Take 1 tablet (10 mg total) by mouth daily at 6 PM. (Patient not taking: No sig reported) 30 tablet 0   divalproex (DEPAKOTE) 500 MG DR tablet Take 2 tablets (1,000 mg total) by mouth every morning. (Patient not taking: No sig reported) 60 tablet 0   OLANZapine (ZYPREXA) 10 MG tablet 2 (two) times daily. (Patient not taking: Reported on 02/06/2021)     paliperidone (INVEGA SUSTENNA) 156 MG/ML SUSY injection Inject 1 mL (156 mg total) into the muscle once for 1 dose. (Patient not taking: Reported on 02/06/2021) 1 mL 0   paliperidone (INVEGA SUSTENNA) 234 MG/1.5ML SUSY injection Inject 234 mg into the muscle once for 1 dose. Next injection due 05/27/18 (Patient not taking: Reported on 02/06/2021) 1.5 mL 0   paliperidone (INVEGA) 6 MG 24 hr tablet Take 1 tablet (6 mg total) by mouth daily. (Patient not taking: No sig reported) 30 tablet 0   traZODone (DESYREL) 100 MG tablet Take 1 tablet (100 mg total) by  mouth at bedtime as needed for sleep. (Patient not taking: No sig reported) 30 tablet 0    Musculoskeletal: Strength & Muscle Tone: within normal limits Gait & Station: normal Patient leans: N/A   Psychiatric Specialty Exam:  Presentation  General Appearance:  Appropriate for Environment Eye Contact: Good Speech: Clear and Coherent Speech Volume: Decreased Handedness: Right  Mood and Affect  Mood: Euthymic Affect: Congruent  Thought Process  Thought  Processes: Coherent Descriptions of Associations:Intact Orientation:Partial Thought Content:No data recorded History of Schizophrenia/Schizoaffective disorder:No data recorded Duration of Psychotic Symptoms:No data recorded Hallucinations:Hallucinations: None Ideas of Reference:None Suicidal Thoughts:Suicidal Thoughts: No Homicidal Thoughts:Homicidal Thoughts: No  Sensorium  Memory: Immediate Fair; Recent Fair; Remote Fair Judgment: Fair Insight: Fair  Art therapist  Concentration: Good Attention Span: Good Recall: Good Fund of Knowledge: Fair Language: Good  Psychomotor Activity  Psychomotor Activity: Psychomotor Activity: Normal  Assets  Assets: Communication Skills; Housing; Health and safety inspector; Social Support; Physical Health  Sleep  Sleep: Sleep: Good   Physical Exam: Physical Exam Cardiovascular:     Rate and Rhythm: Normal rate.  Pulmonary:     Effort: Pulmonary effort is normal.  Neurological:     Mental Status: He is alert.     Comments: Refused to identify himself by full name. Verified DOB. States "I don't have a last name, I am adopted"  Psychiatric:        Attention and Perception: Attention normal.        Speech: Speech is delayed.        Behavior: Behavior is cooperative.        Thought Content: Thought content is not paranoid or delusional. Thought content does not include homicidal or suicidal ideation. Thought content does not include homicidal or suicidal plan.   Review of Systems  Respiratory:  Negative for shortness of breath.   Cardiovascular:  Negative for chest pain.  Gastrointestinal:  Negative for abdominal pain.  Neurological:  Negative for headaches.  Psychiatric/Behavioral:  Negative for hallucinations, substance abuse and suicidal ideas. The patient is not nervous/anxious and does not have insomnia.   Blood pressure 107/68, pulse 79, temperature 98.6 F (37 C), temperature source Oral, resp. rate 16,  height 5\' 6"  (1.676 m), weight 63.5 kg, SpO2 97 %. Body mass index is 22.6 kg/m.  Treatment Plan Summary: Plan safe for outpatient treatment with resources provided.  Patient is psychiatrically cleared.   Disposition: No evidence of imminent risk to self or others at present.   Patient does not meet criteria for psychiatric inpatient admission. Supportive therapy provided about ongoing stressors. Discussed crisis plan, support from social network, calling 911, coming to the Emergency Department, and calling Suicide Hotline. To return to boarding house. Prescriptions for medications will be sent to boarding house upon discharge.   Collateral: Spoke with legal guardian, , number [phone], [phone] reports that he does not know this patient very well he is only been working with him for 2 days reports that he is working hard to get him connected with [facility] in [place], which does not have walk-in hours.  Understands that he is not meeting inpatient criteria, request that medications be provided at discharge.  Reports that he is staying at a boarding house with minimal supervision, contact for the boarding house is CULHAM number [phone].  This service was provided via telemedicine using a 2-way, interactive audio and video technology.  Names of all persons participating in this telemedicine service and their role in this encounter. Name: David Davila Role:  patient  Name: Dorena Bodo Role: NP  EDP, ED staff, and CSW notified of disposition via secure chat.   Novella Olive, NP 02/08/2021 2:03 PM

## 2021-02-08 NOTE — Discharge Instructions (Addendum)
Please take your medications as prescribed.  Follow closely with your outpatient psychiatry team.  Return to the emergency department any new or suddenly worsening symptoms.

## 2021-02-08 NOTE — ED Notes (Signed)
Recommendation to discharge patient with medications from our pharmacy if possible from TTS and patient caregiver request. Spoke with [facility] and they state this is not possible and is legal issue. Dr. Dereck Ligas made aware.

## 2021-02-08 NOTE — ED Notes (Signed)
Pt ambulatory to bathroom. UA not obtained. Pt back in room and allowed this tech to obtain vitals. Pt calm and coroperative in bed at this time. This tech will continue to monitor

## 2021-02-08 NOTE — ED Notes (Addendum)
Spoke with guardian Ane Payment multiple times in reference to patient discharge. Agreeable to discharge so long as medications sent to pharmacy and patient taken directly to group home via safe transport. Dr. Jacqulyn Bath has sent medication to pharmacy. Safe transport contacted and sending staff member to ED to transport patient to group home. Personal belongings returned to patient. LG verbalized understanding of discharge instructions. Address to group home confirmed via LG, [name]. Patient alert and cooperative. TTS aware of situation and group home director asked for transport due to emergent situation.

## 2021-04-13 ENCOUNTER — Emergency Department (HOSPITAL_COMMUNITY)
Admission: EM | Admit: 2021-04-13 | Discharge: 2021-04-25 | Disposition: A | Discharge status: Home / Self Care | Payer: Medicare Other | Attending: Emergency Medicine | Admitting: Emergency Medicine

## 2021-04-13 ENCOUNTER — Encounter (HOSPITAL_COMMUNITY): Payer: Self-pay

## 2021-04-13 DIAGNOSIS — F99 Mental disorder, not otherwise specified: Secondary | ICD-10-CM

## 2021-04-13 DIAGNOSIS — F200 Paranoid schizophrenia: Secondary | ICD-10-CM | POA: Diagnosis present

## 2021-04-13 DIAGNOSIS — F602 Antisocial personality disorder: Secondary | ICD-10-CM | POA: Diagnosis not present

## 2021-04-13 DIAGNOSIS — Z046 Encounter for general psychiatric examination, requested by authority: Secondary | ICD-10-CM | POA: Diagnosis present

## 2021-04-13 DIAGNOSIS — Z20822 Contact with and (suspected) exposure to covid-19: Secondary | ICD-10-CM | POA: Diagnosis not present

## 2021-04-13 DIAGNOSIS — F17210 Nicotine dependence, cigarettes, uncomplicated: Secondary | ICD-10-CM | POA: Diagnosis not present

## 2021-04-13 LAB — CBC WITH DIFFERENTIAL/PLATELET
Basophils Absolute: 0 10*3/uL (ref 0.0–0.1)
Basophils Relative: 1 %
Eosinophils Absolute: 0.1 10*3/uL (ref 0.0–0.5)
Eosinophils Relative: 4 %
HCT: 37.7 % — ABNORMAL LOW (ref 39.0–52.0)
Hemoglobin: 12.6 g/dL — ABNORMAL LOW (ref 13.0–17.0)
Lymphocytes Relative: 36 %
Lymphs Abs: 1.3 10*3/uL (ref 0.7–4.0)
MCH: 31.3 pg (ref 26.0–34.0)
MCHC: 33.4 g/dL (ref 30.0–36.0)
MCV: 93.5 fL (ref 80.0–100.0)
Monocytes Absolute: 0.3 10*3/uL (ref 0.1–1.0)
Monocytes Relative: 7 %
Neutro Abs: 1.9 10*3/uL (ref 1.7–7.7)
Neutrophils Relative %: 52 %
Platelets: 217 10*3/uL (ref 150–400)
RBC: 4.03 MIL/uL — ABNORMAL LOW (ref 4.22–5.81)
RDW: 13.1 % (ref 11.5–15.5)
WBC: 3.6 10*3/uL — ABNORMAL LOW (ref 4.0–10.5)
nRBC: 0 % (ref 0.0–0.2)

## 2021-04-13 LAB — ETHANOL: Alcohol, Ethyl (B): <10 mg/dL (ref <10)

## 2021-04-13 LAB — COMPREHENSIVE METABOLIC PANEL
ALT: 23 U/L (ref 0–44)
AST: 43 U/L — ABNORMAL HIGH (ref 15–41)
Albumin: 3.9 g/dL (ref 3.5–5.0)
Alkaline Phosphatase: 61 U/L (ref 38–126)
Anion gap: 7 (ref 5–15)
BUN: 20 mg/dL (ref 6–20)
CO2: 25 mmol/L (ref 22–32)
Calcium: 9.1 mg/dL (ref 8.9–10.3)
Chloride: 103 mmol/L (ref 98–111)
Creatinine, Ser: 0.93 mg/dL (ref 0.61–1.24)
GFR, Estimated: >60 mL/min (ref >60)
Glucose, Bld: 107 mg/dL — ABNORMAL HIGH (ref 70–99)
Potassium: 3.9 mmol/L (ref 3.5–5.1)
Sodium: 135 mmol/L (ref 135–145)
Total Bilirubin: 0.8 mg/dL (ref 0.3–1.2)
Total Protein: 7 g/dL (ref 6.5–8.1)

## 2021-04-13 NOTE — ED Notes (Signed)
Attempted to get VS on pt. Pt ripped EKG leads on his chest stating we did not get the EKG. I tried to get BP, pt would love let me place BP cuff on his arm and repeatedly  Stating "are you a doctor"

## 2021-04-13 NOTE — ED Triage Notes (Signed)
Pt brought to ED under IVC by [place] [employer]. IVC papers taken out by Maggie Schwalbe (over independent living home where he was staying), IVC papers state "The respondent is mentally ill and is not taking his medication. The respondent has lost a lot of weight in the last few weeks because he is not eating. The respondent broke windows at the facility. The respondent is a danger to himself and others. The respondent needs to be examined."

## 2021-04-13 NOTE — ED Notes (Signed)
Patient refused vitals. Rn notified.

## 2021-04-13 NOTE — BH Assessment (Addendum)
Comprehensive Clinical Assessment (CCA) Screening, Triage and Referral Note  04/13/2021 David Davila [id number] Disposition: Clinician discussed patient care with Roselyn Bering, NP.  She recommended getting collateral information since patient was uncooperative during assessment.  Pt to be observed overnight and collateral info sought in the meantime.  Clinician informed RN Adline Potter of the recommendation.    Patient was uncooperative during assessment.  He turned his back on clinician and covered himself w/ blanket.  He would talk some but could not be clearly heard and he refused to turn around and engage clearly.  From a previous assessment done on 02/07/21 patient has a guardian named Bertram Millard [phone].  Clinician attempted to call but there was no answer.  Clinician attempted to call petitioner Maggie Schwalbe [phone] and a HIPPA compliant message was left.   Chief Complaint:  Chief Complaint  Patient presents with   V70.1   Visit Diagnosis: Schizophrenia; Antisocial personality d/o  Patient Reported Information How did you hear about [place]? Other (Comment) (Pt is on petition and was brought to [facility].)  What Is the Reason for Your Visit/Call Today? Pt says he is unsure why he is at the hospital.  Pt denies any SI.  When asked if he lived by himself or with others he said "They took my house from me."  He turned over and did not talk for a bit.  When he did talk he kept his head turned away and a blanket over his face.  He was hard to hear and he did not turn around and talk as he was requested.  Clinician left message with petitioner.  Attempted to call the guardian, no answer.  Per chart, patient was released from jail in June.  Has been living in an independent living facility but pt has not been taking meds.  How Long Has This Been Causing You Problems? No data recorded What Do You Feel Would Help You the Most Today? Treatment for Depression or  other mood problem   Have You Recently Had Any Thoughts About Hurting Yourself? No  Are You Planning to Commit Suicide/Harm Yourself At This time? No   Have you Recently Had Thoughts About Hurting Someone Else? -- (Pt is unclear)  Are You Planning to Harm Someone at This Time? -- (Pt is unclear.)  Explanation: No data recorded  Have You Used Any Alcohol or Drugs in the Past 24 Hours? -- (Unknown)  How Long Ago Did You Use Drugs or Alcohol? No data recorded What Did You Use and How Much? No data recorded  Do You Currently Have a Therapist/Psychiatrist? -- (Unknown)  Name of Therapist/Psychiatrist: No data recorded  Have You Been Recently Discharged From Any Office Practice or Programs? No  Explanation of Discharge From Practice/Program: No data recorded   CCA Screening Triage Referral Assessment Type of Contact: Tele-Assessment  Telemedicine Service Delivery:   Is this Initial or Reassessment? Initial Assessment  Date Telepsych consult ordered in [facility]:  04/13/21  Time Telepsych consult ordered in [facility]:  2132  Location of Assessment: [facility]  Provider Location: [facility] Assessment Services   Collateral Involvement: Attempted to call petitioner and guardian.  No answer.  Message left with petitioner.   Does Patient Have a Automotive engineer Guardian? No data recorded Name and Contact of Legal Guardian: No data recorded If Minor and Not Living with Parent(s), Who has Custody? No data recorded Is CPS involved or ever been involved? No data recorded Is APS  involved or ever been involved? No data recorded  Patient Determined To Be At Risk for Harm To Self or Others Based on Review of Patient Reported Information or Presenting Complaint? -- (Unknown.)  Method: No data recorded Availability of Means: No data recorded Intent: No data recorded Notification Required: No data recorded Additional Information for Danger to Others Potential: No data recorded Additional Comments  for Danger to Others Potential: No data recorded Are There Guns or Other Weapons in Your Home? No data recorded Types of Guns/Weapons: No data recorded Are These Weapons Safely Secured?                            No data recorded Who Could Verify You Are Able To Have These Secured: No data recorded Do You Have any Outstanding Charges, Pending Court Dates, Parole/Probation? No data recorded Contacted To Inform of Risk of Harm To Self or Others: No data recorded  Does Patient Present under Involuntary Commitment? Yes  IVC Papers Initial File Date: 04/13/21   [place] of Residence: [place]   Patient Currently Receiving the Following Services: Not Receiving Services   Determination of Need: Urgent (48 hours)   Options For Referral: Other: Comment (Pt needs to be observed.  Collateral information needs to be gathered.)   Discharge Disposition:     [place], LCAS

## 2021-04-13 NOTE — ED Notes (Signed)
Pt changed out into burgundy scrubs, personal belongings (1 bag) labeled et locked in locker per protocol with officer presence. Pt not wanded yet because taking one step at a time to keep pt calm.

## 2021-04-13 NOTE — ED Notes (Signed)
Unable to obtain full set of VS at this time. Pt uncooperative.
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
## 2021-04-14 DIAGNOSIS — F200 Paranoid schizophrenia: Secondary | ICD-10-CM

## 2021-04-14 MED ORDER — MIRTAZAPINE 15 MG PO TBDP
15.0000 mg | ORAL_TABLET | Freq: Every day | ORAL | Status: DC
  Administered 2021-04-15 – 2021-04-24 (×5): 15 mg via ORAL
  Filled 2021-04-14 (×13): qty 1

## 2021-04-14 MED ORDER — BENZTROPINE MESYLATE 1 MG PO TABS
0.5000 mg | ORAL_TABLET | Freq: Two times a day (BID) | ORAL | Status: DC
  Administered 2021-04-15 – 2021-04-18 (×5): 0.5 mg via ORAL
  Filled 2021-04-14 (×7): qty 1

## 2021-04-14 MED ORDER — LORAZEPAM 2 MG/ML IJ SOLN
2.0000 mg | Freq: Once | INTRAMUSCULAR | Status: AC
  Administered 2021-04-14: 2 mg via INTRAMUSCULAR
  Filled 2021-04-14: qty 1

## 2021-04-14 MED ORDER — ZIPRASIDONE MESYLATE 20 MG IM SOLR
20.0000 mg | Freq: Once | INTRAMUSCULAR | Status: AC
  Administered 2021-04-14: 20 mg via INTRAMUSCULAR
  Filled 2021-04-14: qty 20

## 2021-04-14 MED ORDER — OLANZAPINE 5 MG PO TBDP: 10.0000 mg | ORAL_TABLET | Freq: Every day | ORAL | Status: DC

## 2021-04-14 MED ORDER — HALOPERIDOL 5 MG PO TABS
5.0000 mg | ORAL_TABLET | Freq: Two times a day (BID) | ORAL | Status: DC
  Administered 2021-04-15 – 2021-04-18 (×5): 5 mg via ORAL
  Filled 2021-04-14 (×7): qty 1

## 2021-04-14 MED ORDER — HALOPERIDOL 5 MG PO TABS: 5.0000 mg | ORAL_TABLET | Freq: Two times a day (BID) | ORAL | Status: DC

## 2021-04-14 MED ORDER — HALOPERIDOL LACTATE 5 MG/ML IJ SOLN
5.0000 mg | Freq: Once | INTRAMUSCULAR | Status: AC
  Administered 2021-04-14: 5 mg via INTRAMUSCULAR
  Filled 2021-04-14: qty 1

## 2021-04-14 MED ORDER — STERILE WATER FOR INJECTION IJ SOLN
INTRAMUSCULAR | Status: AC
  Filled 2021-04-14: qty 10

## 2021-04-14 NOTE — ED Provider Notes (Signed)
Patient is acutely agitated and aggressive with staff.  He is involuntarily committed and acutely psychotic.  He will be given IM Haldol.   Pricilla Loveless, MD 04/14/21 2255

## 2021-04-14 NOTE — ED Notes (Signed)
Pt refused vital signs.

## 2021-04-14 NOTE — ED Notes (Signed)
Re-attempted Vs, COVID swab, and administration of night medications. Pt refused and became agitated. Pt stated "I gave my stuff away." Attempted to inform pt importance of taking medication. For that pt continued to repeat "no, I gave my stuff away." Due to pt increasing agitation pt was not pressed further. Will continue to monitor. Sitter remains at bedside. Pt eating dinner. Refused any other needs.

## 2021-04-14 NOTE — ED Notes (Signed)
Pt was sleeping et not willing to allow me to get vital signs on him at current

## 2021-04-14 NOTE — ED Notes (Signed)
Observed patient trying to shut room door in sitter's face. Sitter attempted to open door patient pushed it back closed. Security called to room.

## 2021-04-14 NOTE — ED Notes (Signed)
Went to introduce self to pt. Pt seems to be responding to external stimuli. Pt speech is incomprehensible as he looks around his room. Sitter at bedside. Refusing VS/Meds.

## 2021-04-14 NOTE — Progress Notes (Signed)
-  Pt received night time snack food tray and drink.
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
## 2021-04-14 NOTE — ED Notes (Signed)
Per EDP pt is medically cleared and ready for psych admit.

## 2021-04-15 LAB — RESP PANEL BY RT-PCR (FLU A&B, COVID) ARPGX2
Influenza A by PCR: NEGATIVE
Influenza B by PCR: NEGATIVE
SARS Coronavirus 2 by RT PCR: NEGATIVE

## 2021-04-15 MED ORDER — LORAZEPAM 2 MG/ML IJ SOLN
2.0000 mg | Freq: Once | INTRAMUSCULAR | Status: AC
  Administered 2021-04-15: 2 mg via INTRAMUSCULAR
  Filled 2021-04-15: qty 1

## 2021-04-15 MED ORDER — HALOPERIDOL LACTATE 5 MG/ML IJ SOLN
INTRAMUSCULAR | Status: AC
  Administered 2021-04-15: 5 mg via INTRAMUSCULAR
  Filled 2021-04-15: qty 1

## 2021-04-15 MED ORDER — HALOPERIDOL LACTATE 5 MG/ML IJ SOLN: 5.0000 mg | Freq: Once | INTRAMUSCULAR | Status: AC
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
## 2021-04-15 NOTE — ED Notes (Signed)
Patient ambulated to bathroom with officer at this time. Did not provide urine sample at this time. Behaviors have improved at this time.

## 2021-04-15 NOTE — Progress Notes (Signed)
Per Tilford Pillar, patient meets criteria for inpatient treatment. There are no available or appropriate beds at [facility] today. CSW faxed referrals to the following facilities for review:  [facility]  Pending - No Request Sent N/A [address] [place] [address] [phone] --  [facility]  Pending - No Request Sent N/A [address] [place] [address] [phone] --  [facility]  Pending - No Request Sent N/A [address] [place] [address] [phone] --  [facility]  Pending - No Request Sent N/A [address] [place], [place] [address] [phone] --  [facility]  Pending - No Request Sent N/A [address] [place] [address] [phone] --  [facility]  Pending - No Request Sent N/A [address] [place] [address] [phone] --  [facility]  Pending - No Request Sent N/A [address] [place] [address] [phone] --  [facility]  Pending - No Request Sent N/A [address] [place] [address] [phone] --  [facility]  Pending - No Request Sent N/A [address] [place] [address] [phone] --  [facility]  Pending - No Request Sent N/A [address] Karolee Ohs [place] [address] [phone] --  [facility]  Pending - No Request Sent N/A [address], [place] [address] [phone] --  [facility]  Pending - No Request Sent N/A [address], [place] [address] [phone] --  [facility]  Pending - No Request Sent N/A [address] [place] [address] [phone] --   TTS will  continue to seek bed placement.  Crissie Reese, MSW, LCSW-A, LCAS-A Phone: [phone] Disposition/TOC

## 2021-04-15 NOTE — ED Notes (Signed)
Pt informed a COVID swab needed to be collected. Became very agitated and yelling. Thought process was incoherent. Pt began to yell about not being at home, then about the TV, and then about not wanting RN in room. Security at bedside. Attempted to calm pt down and was unsuccessful. Pt began to continue to yell and was auidiable throughout the department. Pt began to make threats. Pt pushed RN Beth who was attempted to convince pt to self swab. Emergency medicines were administered. Pt allowed charge RN Sheralyn Boatman to administer medications without need to be restrained. Security remains at bedside due to staff safety concerns. Pt is pacing in room and out in hallway. Sitter at bedside.
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
## 2021-04-16 MED ORDER — ZIPRASIDONE MESYLATE 20 MG IM SOLR
20.0000 mg | Freq: Once | INTRAMUSCULAR | Status: AC | PRN
  Administered 2021-04-16: 20 mg via INTRAMUSCULAR
  Filled 2021-04-16: qty 20

## 2021-04-16 MED ORDER — STERILE WATER FOR INJECTION IJ SOLN
INTRAMUSCULAR | Status: AC
  Administered 2021-04-16: 10 mL
  Filled 2021-04-16: qty 10

## 2021-04-16 MED ORDER — LORAZEPAM 2 MG/ML IJ SOLN: 2.0000 mg | Freq: Once | INTRAMUSCULAR | Status: DC

## 2021-04-16 MED ORDER — LORAZEPAM 2 MG/ML IJ SOLN
2.0000 mg | Freq: Once | INTRAMUSCULAR | Status: AC | PRN
  Administered 2021-04-19: 2 mg via INTRAVENOUS
  Filled 2021-04-16 (×2): qty 1

## 2021-04-16 NOTE — ED Notes (Signed)
Pt is very agitated keep coming to door and standing and asking what's in his chart and standing over sitter. He also is making all 3 sitters feel uncombable because he wont sit down. He keep closing door and then keep asking foe sitter to sit in room with him

## 2021-04-16 NOTE — Progress Notes (Signed)
Patient continues to meet criteria for [facility] inpatient and has been recommended to be faxed out for review per Dr. Lucianne Muss. Patient meets inpatient criteria per Tanika Lewis,NP. Patient referred to the following facilities:  [facility]  [address] [place] [address] [phone]  [facility]  [address] [place] [address] [phone]  [facility]  [address] [place] [address] [phone]  [facility]  [address] [place], [place] [address] [phone]  [facility]  [address] [place] [address] [phone]  [facility]  [address] [place] [address] [phone]  [facility]  [address] [place] [address] [phone]  [facility]  [address] [place] [address] [phone]  [facility]  [address], [place] [address] [phone]  [facility]  [address] [place] [address] [phone]  [facility]  [address], [place] [address] [phone]  [facility]  [address], [place] [address] [phone]  [facility]  [address], [place] [address] [phone]    CSW will continue to monitor disposition.    Damita Dunnings, MSW, LCSW-A  9:25 AM 04/16/2021

## 2021-04-16 NOTE — ED Notes (Signed)
Went to pt's room to given PM meds. Pt stated "I already took meds, I aint taking more meds"

## 2021-04-16 NOTE — ED Notes (Signed)
Pt called me in his room and stated (do not bring any more food or drink in his room unless he tell me to, and tell everybody else)

## 2021-04-16 NOTE — ED Notes (Signed)
Pt refused vitals at this time will attempt again later

## 2021-04-16 NOTE — ED Notes (Signed)
Pt cllled

## 2021-04-16 NOTE — ED Notes (Signed)
Pt becoming increasingly agitated pacing in room, standing in door way, demanding his sitter sit down in the chair and told her she was not allowed to stand. He then demanded that she sit in his room. Began to get upset standing over sitter, clenching fists, demanding she show him his chart and what she was typing. She explained she could not do that right now and would let the nurse know and asked him to please sit down but he refused. Pt then closed the door and became further agitated and aggressive when told that it must remain open. He then stormed out of the room and began hovering over another sitter with fists clenched. RN attempted to intervene and pt states "you better quit getting involved and stay out of my way, youre about to be sorry". Security and [employer] called during escalation of event. New orders placed for prn geodon if patient was unable to calm down and continued to be aggressive toward staff. Additional RNs attempted to help de-escalate situation verbally, but unsuccessful. Pt continues to verbally threaten staff. He did however agree to allow this nurse to give him his prn medication.

## 2021-04-16 NOTE — ED Notes (Signed)
Pt throwing drinks around in room. EDP notified.

## 2021-04-16 NOTE — ED Notes (Signed)
Pt breakfast at bedside 

## 2021-04-16 NOTE — ED Notes (Signed)
Pt is walking around room stating that sitter can not leave where she sitting and asking sitter to come in room and sit he also keep closing door and stating come in room.

## 2021-04-16 NOTE — ED Notes (Signed)
Went in to obtain vitals. Pt gave me his arm and I applied BP cuff. Then pt slapped my arm, stating dont touch me. Vitals were not obtained

## 2021-04-16 NOTE — ED Notes (Signed)
Correction to sterile water reconstitution. Only 1.84ml used to reconstitute, 8.8 mL wasted.

## 2021-04-17 MED ORDER — LORAZEPAM 1 MG PO TABS
1.0000 mg | ORAL_TABLET | Freq: Four times a day (QID) | ORAL | Status: DC
  Administered 2021-04-17 – 2021-04-24 (×14): 1 mg via ORAL
  Filled 2021-04-17 (×17): qty 1

## 2021-04-17 NOTE — ED Notes (Signed)
Pt is already up removing blanket out his room putting it in hall

## 2021-04-17 NOTE — ED Notes (Signed)
Vitals obtained by 2 staff RN Caitey and NT Jul;ia. Pt now up moving trash cans in hall. Asked pt t[o please go back into room and lay down. Pt proceeds to close the door in my face multiple times. [employer] and security called to bed side.

## 2021-04-17 NOTE — ED Notes (Signed)
Pt breakfast is at bedside °

## 2021-04-17 NOTE — ED Notes (Signed)
Lunch tray given to pt.

## 2021-04-17 NOTE — ED Notes (Signed)
Updated legal guardian on plan of care.

## 2021-04-17 NOTE — ED Notes (Signed)
Attempted to take pts vital signs. Pt stated "do not take my vital signs, go to your own house and take your own peoples vital signs".

## 2021-04-17 NOTE — ED Notes (Signed)
Pt is in hall have to try and redirect  him back in his room. Pt also keep closing door and sitter open it. Pt stated this is your last chance don't open door again

## 2021-04-17 NOTE — ED Notes (Signed)
Pt resting at this time. Will attempt vitals at another time.

## 2021-04-17 NOTE — ED Notes (Signed)
Pt is still open and closing door and will not sit down he continue standing at door. He open and closing the drawers taking stuff out.

## 2021-04-17 NOTE — ED Notes (Signed)
Pt took meds without any issue at this time.

## 2021-04-17 NOTE — ED Provider Notes (Signed)
  Physical Exam  BP (!) 130/59 (BP Location: Left Arm)   Pulse 80   Temp 98.6 F (37 C) (Oral)   Resp 20   Ht 6' (1.829 m)   Wt 54.4 kg   SpO2 98%   BMI 16.27 kg/m   Physical Exam  ED Course/Procedures     Procedures  MDM  In bed at this time.  Still pending inpatient treatment.  Reportedly moving trash cans and throwing drinks yesterday.       Benjiman Core, MD 04/17/21 [phone]

## 2021-04-17 NOTE — Progress Notes (Signed)
Patient has been faxed out due to [facility] being under COVID restrictions. Patient meets inpatient criteria per Hillery Jacks ,NP. Patient referred to the following facilities:  [facility]  [address] [place] [address] [phone]  [facility]  [address] [place] [address] [phone]  [facility]  [address] [place] [address] [phone]  [facility]  [address] [place], [place] [address] [phone]  [facility]  [address] [place] [address] [phone]  [facility]  [address] [place] [address] [phone]  [facility]  [address] [place] [address] [phone]  [facility]  [address] [place] [address] [phone]  [facility]  [address] [place] [address] [phone]  [facility]  [address], [place] [address] [phone]  [facility]  [address], [place] [address] [phone]  [facility]  [address] [place] [address] [phone]  [facility]  [address] [place] [address] [phone]    CSW will continue to monitor disposition.    Damita Dunnings, MSW, LCSW-A  10:12 AM 04/17/2021

## 2021-04-18 MED ORDER — ZIPRASIDONE MESYLATE 20 MG IM SOLR
20.0000 mg | Freq: Once | INTRAMUSCULAR | Status: AC
  Administered 2021-04-18: 20 mg via INTRAMUSCULAR
  Filled 2021-04-18: qty 20

## 2021-04-18 MED ORDER — STERILE WATER FOR INJECTION IJ SOLN
INTRAMUSCULAR | Status: AC
  Filled 2021-04-18: qty 10

## 2021-04-18 NOTE — ED Notes (Signed)
Pt refuses to take his 1400 dose of Ativan PO. Pt states, "That is David Davila medicine and I am not David Davila. I am David Davila and I am not taking anymore of David Davila medicine." RN attempted to reassure pt this medication was ordered for him, but he continuously refused to comply with medication administration. RN will continue to monitor.

## 2021-04-18 NOTE — Progress Notes (Signed)
Patient has been denied by [facility] due to COVID restrictions and no bed availability. Patient meets inpatient criteria per Tanika Lewis,NP. Patient referred to the following facilities:  [facility]  [address] [place] [address] [phone]  [facility]  [address] [place] [address] [phone]  [facility]  [address] [place] [address] [phone]  [facility]  [address] [place], [place] [address] [phone]  [facility]  [address] [place] [address] [phone]  [facility]  [address] [place] [address] [phone]  [facility]  [address], [place] [address] [phone]  [facility]  [address] [place] [place] [address] [phone]  [facility]  [address] [place] [address] [phone]  [facility]  [address], [place] [address] [phone]  [facility]  [address] [place] [address] [phone]  [facility]  [address] [place] [address] [phone]  [facility]  [address] [place] [address] [phone]    CSW will continue to monitor disposition.    Damita Dunnings, MSW, LCSW-A  10:35 AM 04/18/2021

## 2021-04-18 NOTE — ED Notes (Signed)
Sitter called RN to bedside due to pt standing on the bed and what looked to her like he was trying to hang from the garage door. RN, police and security went straight to bedside, but pt had already gotten down and was walking around the room calmly. Pt now lying down in bed calm and cooperative. Will continue to monitor.

## 2021-04-18 NOTE — ED Notes (Signed)
Pt becoming agitated with staff. Pt coming out of room and unable to redirect. Pt visibly upset and agitated. Informed EDP pt upset and agitated. Security and [employer] to bedside. Orders received.

## 2021-04-18 NOTE — ED Notes (Signed)
Pt refused VS through the night. RR-16, asleep. Will attempt when patient wakes up.

## 2021-04-18 NOTE — ED Notes (Signed)
Pt refused vital signs.

## 2021-04-18 NOTE — ED Notes (Signed)
Pt refused VS at this time .

## 2021-04-19 NOTE — ED Notes (Signed)
Resting quietly in bed at this time with sheet pulled over head. No further behaviors noted after medication administered earlier.

## 2021-04-19 NOTE — ED Notes (Signed)
Pt up to restroom at this time

## 2021-04-19 NOTE — ED Notes (Signed)
Security to door to remove tray from patient's possession. Patient upset and and slammed door to room. After speaking with patient he calmed and sat down on end of bed.

## 2021-04-19 NOTE — ED Notes (Signed)
Patient asking for and received Ginger ale to drink

## 2021-04-19 NOTE — ED Notes (Signed)
Pa 

## 2021-04-19 NOTE — ED Notes (Signed)
Patient ambulated to restroom with no incident. Patient in room dancing and singing to self

## 2021-04-19 NOTE — ED Notes (Signed)
Patient pacing in room. Standing in doorway

## 2021-04-19 NOTE — ED Notes (Signed)
Patient to cart containing patient's trays and removed one from top of cart after being told repeatedly not to touch it. Patient proceeds back to room.Tray contains knife and fork, security called at this time to remove utensils from patient.

## 2021-04-19 NOTE — ED Notes (Signed)
Pt pacing in room and will not let us get vital signs at this time.

## 2021-04-19 NOTE — ED Notes (Signed)
Patient out in hallway. Asked patient to step back in room. Patient walked towards me with closed fist. Asked Aggie Cosier to call Security, patient goes in room and close door. Security and [employer] at room.

## 2021-04-19 NOTE — Progress Notes (Signed)
Patient has been faxed out due to COVID restrictions at [facility] and the need for a thought d/o bed. Patient meets inpatient criteria per Tanika Lewis,NP. Patient referred to the following facilities:  [facility]  [address] [place] [address] [phone]  [facility]  [address], [place] [address] [phone]  [facility]  [address] [place] [address] [phone]  [facility]  [address] [place], [place] [address] [phone]  [facility]  [address] [place] [address] [phone]  [facility]  [address] [place] [address] [phone]  [facility]  [address], [place] [address] [phone]  [facility]  [address] [place] [address] [phone]  [facility]  [address], [place] [address] [phone]  [facility]  [address] [place] [address] [phone]  [facility]  [address], [place] [address] [phone]  [facility]  [address], [place] [address] [phone]  [facility]  [address] [place] [address] [phone]    CSW will continue to monitor disposition.    Damita Dunnings, MSW, LCSW-A  9:31 AM 04/19/2021

## 2021-04-19 NOTE — ED Notes (Signed)
Patient sitting up watching tv at this time

## 2021-04-19 NOTE — ED Notes (Signed)
Continue to refuse VS or PO medications. Pacing about in room at this time. Will not speak when spoken to.

## 2021-04-19 NOTE — ED Notes (Signed)
Pt has refused vital signs multiple times.

## 2021-04-19 NOTE — ED Notes (Signed)
Patient standing in door way talking to himself.

## 2021-04-20 MED ORDER — STERILE WATER FOR INJECTION IJ SOLN
INTRAMUSCULAR | Status: AC
  Filled 2021-04-20: qty 10

## 2021-04-20 MED ORDER — HALOPERIDOL LACTATE 5 MG/ML IJ SOLN
5.0000 mg | Freq: Two times a day (BID) | INTRAMUSCULAR | Status: DC
  Administered 2021-04-21 – 2021-04-22 (×2): 5 mg via INTRAMUSCULAR
  Filled 2021-04-20 (×2): qty 1

## 2021-04-20 MED ORDER — ZIPRASIDONE MESYLATE 20 MG IM SOLR
20.0000 mg | Freq: Four times a day (QID) | INTRAMUSCULAR | Status: DC | PRN
  Administered 2021-04-20 – 2021-04-21 (×4): 20 mg via INTRAMUSCULAR
  Filled 2021-04-20 (×4): qty 20

## 2021-04-20 MED ORDER — BENZTROPINE MESYLATE 1 MG/ML IJ SOLN
1.0000 mg | Freq: Two times a day (BID) | INTRAMUSCULAR | Status: DC
  Administered 2021-04-22: 1 mg via INTRAMUSCULAR
  Filled 2021-04-20 (×3): qty 1

## 2021-04-20 MED ORDER — BENZTROPINE MESYLATE 1 MG PO TABS
1.0000 mg | ORAL_TABLET | Freq: Two times a day (BID) | ORAL | Status: DC
  Administered 2021-04-22 – 2021-04-24 (×5): 1 mg via ORAL
  Filled 2021-04-20 (×7): qty 1

## 2021-04-20 MED ORDER — HALOPERIDOL 5 MG PO TABS
5.0000 mg | ORAL_TABLET | Freq: Two times a day (BID) | ORAL | Status: DC
  Administered 2021-04-22 – 2021-04-24 (×5): 5 mg via ORAL
  Filled 2021-04-20 (×7): qty 1

## 2021-04-20 NOTE — ED Notes (Signed)
Attempted to give patients his medications.  Patient is laying in the bed singing.  Patient told me to leave him alone.  Asked the patient if I could take his blood pressure, patient told me to get out of his room.  Asked again and the patient wouldn't speak.

## 2021-04-20 NOTE — ED Notes (Addendum)
RN assessed and introduced self to Pt. Pt observed reorganizing sheets on bed. Pt turned to RN and said "I don't need you to watch me" "Stay away from me" Pt noted to start to get agitated and repeat himself.  Pt refusing Vital Signs at this time.

## 2021-04-20 NOTE — ED Notes (Signed)
Pt sleeping at this time. Will assess vitals when pt awakens.

## 2021-04-20 NOTE — ED Notes (Signed)
After pt took shower he was noted become increasingly agitated, cussing at staff, clenching fists, and appeared to be squaring up and getting into security officer's face. Pt refused to return to room and told the security officer that he wanted him to go into the room first. Backup security called and [employer] at bedside. Pt continues to cuss at staff and attempts to tower over staff when verbal attempts made to de-escalate. Pt becomes more aggressive and hostile with attempts to verbally de-escalate situation.

## 2021-04-20 NOTE — ED Notes (Signed)
Asked the patient if he would take his medication.  Patient stated if I put it in his hand he would take it.  Brought the patient his medication, patient is standing in the doorway.  Placed the medication in the patients hand and the patient took it.  Asked the patient if I could check his blood pressure, patient states no.

## 2021-04-20 NOTE — ED Provider Notes (Signed)
Patient has been mildly agitated at the night shift, but is now resting comfortably. He has refused vital signs multiple times. Due to his continued behavior, patient will have his involuntary commitment paperwork continued.  I have filled out a new IVC form   Zadie Rhine, MD 04/20/21 [phone]

## 2021-04-20 NOTE — Progress Notes (Signed)
Patient has been faxed out due to no appropriate beds at [facility]. Patient is in need of a thought d/o bed. Patient meets inpatient criteria per Tanika Lewis,NP. Patient referred to the following facilities:  [facility]  [address] [place] [address] [phone]  [facility]  [address], [place] [address] [phone]  [facility]  [address] [place] [address] [phone]  [facility]  [address] [place], [place] [address] [phone]  [facility]  [address] [place] [address] [phone]  [facility]  [address] [place] [address] [phone]  [facility]  [address] [place] [address] [phone]  [facility]  [address] [place] [address] [phone]  [facility]  [address] [place] [address] [phone]  [facility]  [address], [place] [address] [phone]  [facility]  [address], [place] [address] [phone]  [facility]  [address], [place] [address] [phone]  [facility]  [address], [place] [address] [phone]    CSW will continue to monitor disposition.    Damita Dunnings, MSW, LCSW-A  9:43 AM 04/20/2021

## 2021-04-20 NOTE — ED Notes (Signed)
Patient refused to have his vitals checked.

## 2021-04-20 NOTE — ED Notes (Signed)
Pt is pacing and standing outside of door. Pt slammed door a few times. Pt calm at this time. Pt refused VS to be taken. Will attempt again later.

## 2021-04-20 NOTE — ED Notes (Signed)
Pt walked out of room to confront this RN. Security present at this time. Pt jittery and argumentative. Pt simply wanted to reinforce no one is going to give him any medicine.

## 2021-04-21 MED ORDER — STERILE WATER FOR INJECTION IJ SOLN
INTRAMUSCULAR | Status: AC
  Administered 2021-04-21: 1 mL
  Filled 2021-04-21: qty 10

## 2021-04-21 NOTE — ED Notes (Signed)
Pt refuses vital signs.

## 2021-04-21 NOTE — ED Notes (Signed)
Appears sleeping at this time.  1 to 1 sitter at bedside.

## 2021-04-21 NOTE — Progress Notes (Signed)
CSW followed-up with Merry Proud with [facility] who advised that the patient was denied due to aggression.   Crissie Reese, MSW, LCSW-A, LCAS-A Phone: [phone] Disposition/TOC

## 2021-04-21 NOTE — ED Notes (Signed)
Pt observed pacing in room, wandering to the door way where he mumbles something to the sitter, then observed returning to bed to lay down and speak to himself while staring at the ceiling.

## 2021-04-21 NOTE — ED Notes (Signed)
Pt agitated in room.  Keeps coming to hall.  Cursing at staff.  Unable to redirect back to room.  Security called.  Pt returned to room.  Security on standby until im medication can be given.  Pt refused to take po medications.

## 2021-04-21 NOTE — Progress Notes (Signed)
Per Tilford Pillar, patient meets criteria for inpatient treatment. There are no available or appropriate beds at [facility] today. CSW faxed referrals to the following facilities for review:  [facility]  Pending - Request Sent N/A [address], [place] [address] [phone] --  [facility]  Pending - Request Sent N/A [address] [place] [address] [phone] --  [facility]  Pending - Request Sent N/A [address] [place] [address] [phone] --  [facility]  Pending - Request Sent N/A [address] [place], [place] [address] [phone] --  [facility]  Pending - Request Sent N/A [address] [place] [address] [phone] --  [facility]  Pending - Request Sent N/A [address], [place] [address] [phone] --  [facility]  Pending - Request Sent N/A [address], [place] [address] [phone] --  [facility]  Pending - Request Sent N/A [address] [place] [address] [phone] --  [facility]  Pending - Request Sent N/A [address] [place] [address] [phone] --  [facility]  Pending - Request Sent N/A [address]olee [address]., [place] [address] [phone] --  [facility]  Pending - Request Sent N/A [address], [place] [address] [phone] --  [facility]  Pending - Request Sent N/A [address], [place] [address] [phone] --  [facility]  Pending - Request Sent N/A [address], [place] [address] [phone] --  [facility]  Pending -  No Request Sent N/A [address], [place] [address] [phone] --  [facility]  Pending - No Request Sent N/A [address] [place] [address] [phone] --  [facility]  Pending - No Request Sent N/A [address] [place] [address] [phone] --  [facility]  Pending - No Request Sent N/A [address], [place] [address] [phone] --  [facility]  Pending - No Request Sent N/A [address], [place] [address] [phone] --  [facility]  Pending - No Request Sent N/A [address], [place] [address] [phone] --  [facility]  Pending - No Request Sent N/A [address] [place] [address] [phone] --  [facility]  Pending - No Request Sent N/A [address] [place], [place] [address] [phone] --  [facility]  Pending - No Request Sent N/A [address] [place] [address] [phone] --  [facility]  Pending - No Request Sent N/A [address] [place], [place] [address] [phone] --   TTS will continue to seek bed placement.  Crissie Reese, MSW, LCSW-A, LCAS-A Phone: [phone] Disposition/TOC

## 2021-04-21 NOTE — ED Notes (Signed)
Pt now awake, lying in bed observed howling at the ceiling like a wolf. Pt still refusing to allow RN in room to check Vital Signs. Pt does not want to be touched.

## 2021-04-21 NOTE — ED Notes (Signed)
Pt standing in doorway banging on chest and wall, verbally getting aggressive towards staff. Security notified.

## 2021-04-21 NOTE — ED Notes (Signed)
Resting in bed with eye closed.  1 to 1 safety sitter at bedside.
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
## 2021-04-21 NOTE — BH Assessment (Signed)
TTS reassessment  Pt refused to cooperate with TTS clinician.

## 2021-04-22 MED ORDER — LORAZEPAM 2 MG/ML IJ SOLN
1.0000 mg | Freq: Once | INTRAMUSCULAR | Status: AC
  Administered 2021-04-22: 1 mg via INTRAMUSCULAR
  Filled 2021-04-22: qty 1

## 2021-04-22 NOTE — ED Notes (Signed)
Gave pt his tray and asked him if he was going to eat he said that he would also asked pt if he needing anything else he stated No that he didn't need anything else .

## 2021-04-22 NOTE — ED Notes (Signed)
Pt sleeping. Will attempt vitals when he wakes up

## 2021-04-22 NOTE — BH Assessment (Addendum)
Requested cart at 3:00 pm for reassessment  3:19 pm:  :Pt is sedated.

## 2021-04-22 NOTE — ED Notes (Signed)
Sitter and RN attempted to obtain vital signs from pt, but he would not allow VS to be taken. Noted to become increasingly agitated when nurse asked a second time.

## 2021-04-22 NOTE — ED Notes (Signed)
Asked pt if he was ok. He shook his head yes.

## 2021-04-22 NOTE — ED Notes (Addendum)
Pt stepped out to put trash into the trash can. Had no problems.

## 2021-04-22 NOTE — ED Notes (Signed)
Pt agreed to take oral medications, being cooperative at this time. Denies any needs at this time. Sitter present.

## 2021-04-22 NOTE — Progress Notes (Signed)
[facility] advised that patient is under review.   Crissie Reese, MSW, LCSW-A, LCAS-A Phone: [phone] Disposition/TOC

## 2021-04-22 NOTE — ED Notes (Signed)
Asked pt if he needed anything he said he was fine. Pt has not eaten yet.

## 2021-04-23 NOTE — Progress Notes (Signed)
Patient has been faxed out under the recommendation of Dr. Lucianne Muss. Patient meets inpatient criteria per Ophelia Shoulder ,NP. Patient referred to the following facilities:  [facility]  [address] [place] [address] [phone]  [facility]  [address], [place] [address] [phone]  [facility]  [address] [place] [address] [phone]  [facility]  [address] [place], [place] [address] [phone]  [facility]  [address] [place] [address] [phone]  [facility]  [address] [place] [address] [phone]  [facility]  [address] [place] [address] [phone]  [facility]  [address] [place] [address] [phone]  [facility]  [address] [place] [address] [phone]  [facility]  [address], [place] [address] [phone]  [facility]  [address] [place] [address] [phone]  [facility]  [address] [place] [address] [phone]  [facility]  [address], [place] [address] [phone]  [facility]  [address] [place] [address] [phone]  [facility]  [address] [place] [address] [phone]  [facility]  [address], [place] [address] [phone]  [facility]  [address], [place] [address] [phone]  [facility]  [address] [place] [address] [phone]  [facility]  [address] [place] [address] [phone]  [facility]  [address] [place] [address] [phone]  [facility]  [address] [place] [address] [phone]  [facility]  [address] [place] [address] [phone]  [facility]  [address] [place], [place] [address] [phone]    CSW will continue to monitor disposition.    Damita Dunnings, MSW, LCSW-A  9:44 AM 04/23/2021

## 2021-04-23 NOTE — ED Notes (Signed)
Pt resting comfortably at this time, will obtain vitals at later time

## 2021-04-24 ENCOUNTER — Encounter (HOSPITAL_COMMUNITY): Payer: Self-pay | Admitting: Registered Nurse

## 2021-04-24 MED ORDER — PALIPERIDONE PALMITATE ER 156 MG/ML IM SUSY
156.0000 mg | PREFILLED_SYRINGE | Freq: Once | INTRAMUSCULAR | Status: AC
  Administered 2021-04-25: 156 mg via INTRAMUSCULAR
  Filled 2021-04-24: qty 1

## 2021-04-24 NOTE — ED Notes (Signed)
Pt cooperative this morning.  Took po medication with no difficulty.  In room watching tv.  1 to 1 sitter with direct observation

## 2021-04-24 NOTE — ED Notes (Signed)
During pt interaction pt was cooperative but suspicious of staff. RN allowed pt to open up medications and take them. He wanted to confirm which medications he was taking. Pt also kept packaging as a reminder of which medications he took. Pt denied any other needs.

## 2021-04-24 NOTE — Progress Notes (Signed)
CSW spoke with David Davila who states David Davila can return to her facility however David Davila needs to have the Invega injection before returning. CSW confirmed David Davila has not had injection since 2020. Attending MD has ordered the injection. CSW also requested all of pts scripts be sent to [facility] for David Davila. David Davila is able to pick David Davila up in the morning. David Davila has been set up with [facility] services for outpatient follow up. TOC to follow.

## 2021-04-24 NOTE — ED Notes (Signed)
Pt is awake and cooperative. Sitter at bedside. Offered snack. Pt conversing appropriately.

## 2021-04-24 NOTE — Consult Note (Signed)
Telepsych Consultation   Reason for Consult: Paranoid schizophrenia Referring Physician: EPD Location of Patient: [facility] Location of Provider: [facility]  Patient Identification: Vinson Tietze. MRN:  [id number] Principal Diagnosis: Paranoid schizophrenia (HCC) Diagnosis:  Principal Problem:   Paranoid schizophrenia (HCC)   Total Time spent with patient: 15 minutes  Subjective:   Keri Tavella. is a 40 y.o. male patient admitted under Involuntary Commitment.    [name], 40 y.o., male patient seen via tele health by this provider, consulted with Dr. Nelly Rout; and chart reviewed on 04/24/21.  On evaluation [name] Reports he is feeling fine.  Patient states he was brought to the hospital after getting into an altercation with staff member at assisted living.  "I got into argument with staff person and I just left.  The lady that own the place tried to stop me telling me I needed to go to the hospital."  Patient denies suicidal/self-harm/homicidal ideation, psychosis, and paranoia.  Patient states he is sleeping without difficulty.  States he is eating but doesn't feel that he is getting enough food because "They are still bring meat on my tray when I told them I don't eat meat.  I don't feel like I'm getting a full course meal."  Patient the went on to describe a full course meal.  Patient states he has been taking his medications.   During evaluation [name] Is elevated up in bed with towel covering the top of his head.  He is in no acute distress.  He is alert, oriented x 3, calm and cooperative.  His mood is euthymic with congruent affect.  He does not appear to be responding to internal/external stimuli or delusional thoughts.  Patient states that "They say I'm delusional cause I say I don't eat meat."  Patient denies suicidal/self-harm/homicidal ideation, psychosis, and paranoia.  Patient  answered question appropriately. Patient report he was getting and taking medications while in assisted living; states he unsure if they will let him return which would leave him homeless.    Collateral information:  Spoke to patients nurse Fayette Pho who informs that he has noticed patient responding to internal stimuli today.  States that patient also took medications  Per Collateral previous assessment:: NP spoke to petitioner Maggie Schwalbe [phone], who reported patient resides independently living facility and recently found that patient had not been taking his p.o. medications as indicated.  She reports worsening aggressive behavior.  Reports patient was scheduled to take a long-acting injectable to [facility] however has not had his medications in the past month.  Past Psychiatric History: See above  Risk to Self:  Denies Risk to Others:  Denies Prior Inpatient Therapy:  Denies Prior Outpatient Therapy:  Denies  Past Medical History:  Past Medical History:  Diagnosis Date   Acute psychosis (HCC)    Antisocial personality disorder (HCC)    Cannabis abuse    Past   Paranoid schizophrenia (HCC)    Schizoaffective disorder, bipolar type (HCC)    History reviewed. No pertinent surgical history. Family History: History reviewed. No pertinent family history. Family Psychiatric  History:  Social History:  Social History   Substance and Sexual Activity  Alcohol Use No     Social History   Substance and Sexual Activity  Drug Use Not Currently    Social History   Socioeconomic History   Marital status: Single    Spouse name: Not on file  Number of children: Not on file   Years of education: Not on file   Highest education level: Not on file  Occupational History   Not on file  Tobacco Use   Smoking status: Every Day    Packs/day: 1.00    Types: Cigarettes   Smokeless tobacco: Never  Substance and Sexual Activity   Alcohol use: No   Drug use: Not Currently    Sexual activity: Not Currently  Other Topics Concern   Not on file  Social History Narrative   Not on file   Social Determinants of Health   Financial Resource Strain: Not on file  Food Insecurity: Not on file  Transportation Needs: Not on file  Physical Activity: Not on file  Stress: Not on file  Social Connections: Not on file   Additional Social History:    Allergies:   Allergies  Allergen Reactions   Invega Sustenna [Paliperidone Palmitate Er] Other (See Comments)    Caused drop in WBC and neutropenia following injection in August 2019 (he was not on other medications that could have caused this drop). Has tolerated it in the past (with no drop in WBC). Repeat CBC a few days later in August then showed improved ANC and WBC. Would recommend monitoring CBC while on this medication   Zyprexa [Olanzapine] Other (See Comments)    Reaction: unknown    Labs:  No results found for this or any previous visit (from the past 48 hour(s)).   Medications:  Current Facility-Administered Medications  Medication Dose Route Frequency Provider Last Rate Last Admin   benztropine (COGENTIN) tablet 1 mg  1 mg Oral BID Leevy-Johnson, Brooke A, NP   1 mg at 04/24/21 1039   Or   benztropine mesylate (COGENTIN) injection 1 mg  1 mg Intramuscular BID Leevy-Johnson, Brooke A, NP   1 mg at 04/22/21 1127   haloperidol (HALDOL) tablet 5 mg  5 mg Oral BID Leevy-Johnson, Brooke A, NP   5 mg at 04/24/21 1039   Or   haloperidol lactate (HALDOL) injection 5 mg  5 mg Intramuscular BID Leevy-Johnson, Brooke A, NP   5 mg at 04/22/21 1123   LORazepam (ATIVAN) tablet 1 mg  1 mg Oral QID Novella Olive, NP   1 mg at 04/24/21 1039   mirtazapine (REMERON SOL-TAB) disintegrating tablet 15 mg  15 mg Oral QHS Oneta Rack, NP   15 mg at 04/23/21 2203   ziprasidone (GEODON) injection 20 mg  20 mg Intramuscular Q6H PRN Zadie Rhine, MD   20 mg at 04/21/21 2318   Current Outpatient Medications  Medication Sig  Dispense Refill   atorvastatin (LIPITOR) 10 MG tablet Take 1 tablet (10 mg total) by mouth daily at 6 PM. 30 tablet 0   benztropine (COGENTIN) 0.5 MG tablet Take 1 tablet (0.5 mg total) by mouth every 12 (twelve) hours. (Patient not taking: Reported on 04/16/2021) 60 tablet 0   benztropine (COGENTIN) 0.5 MG tablet Take 2 tablets (1 mg total) by mouth 2 (two) times daily. 60 tablet 0   divalproex (DEPAKOTE) 500 MG DR tablet Take 2 tablets (1,000 mg total) by mouth every morning. 60 tablet 0   haloperidol (HALDOL) 5 MG tablet Take 1 tablet (5 mg total) by mouth at bedtime. 30 tablet 0   haloperidol (HALDOL) 5 MG tablet Take 1 tablet (5 mg total) by mouth 2 (two) times daily. 60 tablet 0   loperamide (IMODIUM) 2 MG capsule 1 capsule  mirtazapine (REMERON) 15 MG tablet Take 1 tablet (15 mg total) by mouth at bedtime. (Patient not taking: Reported on 04/16/2021) 30 tablet 0   mirtazapine (REMERON) 15 MG tablet Take 1 tablet (15 mg total) by mouth at bedtime. 30 tablet 0   OLANZapine (ZYPREXA) 10 MG tablet Take 1 tablet (10 mg total) by mouth 2 (two) times daily. 60 tablet 0   OLANZapine zydis (ZYPREXA ZYDIS) 10 MG disintegrating tablet Bid 60 tablet 0   paliperidone (INVEGA SUSTENNA) 156 MG/ML SUSY injection Inject 1 mL (156 mg total) into the muscle once for 1 dose. (Patient not taking: Reported on 02/06/2021) 1 mL 0   paliperidone (INVEGA SUSTENNA) 234 MG/1.5ML SUSY injection Inject 234 mg into the muscle once for 1 dose. Next injection due 05/27/18 (Patient not taking: Reported on 02/06/2021) 1.5 mL 0   paliperidone (INVEGA) 6 MG 24 hr tablet Take 1 tablet (6 mg total) by mouth daily. (Patient not taking: No sig reported) 30 tablet 0   traZODone (DESYREL) 100 MG tablet Take 1 tablet (100 mg total) by mouth at bedtime as needed for sleep. 30 tablet 0    Musculoskeletal: Strength & Muscle Tone: within normal limits Gait & Station: normal Patient leans: N/A   Psychiatric Specialty  Exam:  Presentation  General Appearance: Appropriate for Environment  Eye Contact:Good  Speech:Clear and Coherent; Normal Rate  Speech Volume:Normal  Handedness:Right   Mood and Affect  Mood:Euthymic  Affect:Congruent   Thought Process  Thought Processes:Coherent  Descriptions of Associations:Intact  Orientation:Full (Time, Place and Person)  Thought Content:WDL History of Schizophrenia/Schizoaffective disorder:No data recorded Duration of Psychotic Symptoms:No data recorded Hallucinations:Hallucinations: None (Patient denies auditory/visual hallucinations at this time.) Ideas of Reference:None  Suicidal Thoughts:No data recorded Homicidal Thoughts:No data recorded  Sensorium  Memory:Immediate Fair; Recent Fair; Remote Fair  Judgment:Fair  Insight:Fair   Executive Functions  Concentration:Good  Attention Span:Good  Recall:Good  Fund of Knowledge:Fair  Language:Good   Psychomotor Activity  Psychomotor Activity: No data recorded  Assets  Assets:Communication Skills; Housing; Health and safety inspector; Social Support; Physical Health   Sleep  Sleep: No data recorded   Physical Exam: Physical Exam Vitals reviewed.  Cardiovascular:     Rate and Rhythm: Normal rate.  Neurological:     Mental Status: He is alert.  Psychiatric:        Attention and Perception: Attention normal.        Mood and Affect: Mood and affect normal.        Behavior: Behavior normal. Behavior is not aggressive. Behavior is cooperative.        Thought Content: Thought content normal. Thought content is not paranoid. Thought content does not include homicidal or suicidal ideation.        Judgment: Judgment is impulsive.   Review of Systems  Constitutional: Negative.   HENT: Negative.    Eyes: Negative.   Respiratory: Negative.    Cardiovascular: Negative.   Gastrointestinal: Negative.   Genitourinary: Negative.   Musculoskeletal: Negative.   Skin: Negative.    Neurological: Negative.   Endo/Heme/Allergies: Negative.   Psychiatric/Behavioral:  Depression: Stable. Hallucinations: Denies. Suicidal ideas: Denies. Nervous/anxious: Stable.        Patient reporting that caregiver saying he delusion cause he don't eat meat.  Patient stating the is vegan and only eat fruits and vegetables.   Blood pressure 99/62, pulse 63, temperature 97.8 F (36.6 C), temperature source Oral, resp. rate 16, height 6' (1.829 m), weight 54.4 kg, SpO2 100 %. Body mass index  is 16.27 kg/m.  Treatment Plan Summary: Medication management Social work consult ordered to assist with setting up outpatient psychiatric services for medication management and to check with patient previous care giver to see if patient is able to return.  Disposition:  Psychiatrically cleared; referred to social work/TOC No evidence of imminent risk to self or others at present.   Patient does not meet criteria for psychiatric inpatient admission. Supportive therapy provided about ongoing stressors. Discussed crisis plan, support from social network, calling 911, coming to the Emergency Department, and calling Suicide Hotline.  - Restarted home medication This service was provided via telemedicine using a 2-way, interactive audio and video technology.  Names of all persons participating in this telemedicine service and their role in this encounter. Name:  Rafael Bihari  Role: patient   Name: Assunta Found Role: NP   Name: Dr. Nelly Rout Role: Psychiatrist  Name:  Role:    Secure message sent to patients nurse Fayette Pho informing:  Psychiatric consult completed.  Patient appears to be doing better and is taking his medications.   Patient psychiatrically cleared.  Prior to arrival patient reports living in an assisted living.  Need to check if patient is able to return or where he is going from hospital. Patient will also need outpatient psychiatric services for medication management.  Social work  consult ordered.  Patient will also need prescription for psychotropic medications at least 3-4 weeks' worth until can get psychiatric services to continue medications.  Patient psychiatrically cleared.  Inform MD only default listed.      Felishia Wartman, NP 04/24/2021 2:41 PM

## 2021-04-24 NOTE — ED Notes (Signed)
Pt breakfast was placed at bedside 

## 2021-04-24 NOTE — Progress Notes (Signed)
Patient has been faxed out per the recommendation of Dr. Lucianne Muss. Patient meets inpatient criteria per [facility]. Patient referred to the following facilities:  [facility]  [address] [place] [address] [phone]  [facility]  [address] [place] [address] [phone]  [facility]  [address] [place] [address] [phone]  [facility]  [address] [place], [place] [address] [phone]  [facility]  [address] [place] [address] [phone]  [facility]  [address] [place] [address] [phone]  [facility]  [address] [place] [address] [phone]  [facility]  [address] [place] [address] [phone]  [facility]  [address], [place] [address] [phone]  [facility]  [address], [place] [address] [phone]  [facility]  [address] [place] [address] [phone]  [facility]  [address], [place] [address] [phone]  [facility]  [address], [place] [address] [phone]  [facility]  [address], [place] [address] [phone]  [facility]  [address] [place] [address] [phone]  [facility]  [address], [place] [address] [phone]  [facility]  [address] [place] [address] [phone]  [facility]  [address] [place] [address] [phone]  [facility]  [address] [place] [address] [phone]  [facility]  [address] [place] [address] [phone]  [facility]  [address], [place] [address] [phone]  [facility]  [address] [place] [address] [phone]  [facility]  [address] [place], [place] [address] [phone]    CSW will continue to monitor disposition.    Damita Dunnings, MSW, LCSW-A  9:52 AM 04/24/2021

## 2021-04-24 NOTE — Progress Notes (Signed)
CSW has left VM for Steward Drone with the independent living facility. CSW to await return call.

## 2021-04-24 NOTE — ED Notes (Signed)
Per pharmacy invega will be here in the morning.  Pt has been calm and cooperative.  No outbust this shift.  Pt took po medication without difficulty.  1 to 1 sitter with direct observation,

## 2021-04-24 NOTE — ED Notes (Signed)
Assumed patient care at 11pm. Patient has remained in bed. Eyes closed. Respirations are even and unlabored

## 2021-04-25 LAB — POC SARS CORONAVIRUS 2 AG -  ED: SARSCOV2ONAVIRUS 2 AG: NEGATIVE

## 2021-04-25 MED ORDER — OLANZAPINE 10 MG PO TABS: 10.0000 mg | ORAL_TABLET | Freq: Two times a day (BID) | ORAL | 0 refills | Status: DC

## 2021-04-25 MED ORDER — HALOPERIDOL 5 MG PO TABS: 5.0000 mg | ORAL_TABLET | Freq: Every day | ORAL | 0 refills | Status: DC

## 2021-04-25 MED ORDER — TRAZODONE HCL 100 MG PO TABS: 100.0000 mg | ORAL_TABLET | Freq: Every evening | ORAL | 0 refills | Status: DC | PRN

## 2021-04-25 MED ORDER — BENZTROPINE MESYLATE 1 MG PO TABS: 1.0000 mg | ORAL_TABLET | Freq: Two times a day (BID) | ORAL | 0 refills | Status: DC

## 2021-04-25 MED ORDER — MIRTAZAPINE 15 MG PO TABS: 15.0000 mg | ORAL_TABLET | Freq: Every day | ORAL | 0 refills | Status: DC

## 2021-04-25 MED ORDER — ATORVASTATIN CALCIUM 10 MG PO TABS: 10.0000 mg | ORAL_TABLET | Freq: Every day | ORAL | 0 refills | Status: DC

## 2021-04-25 MED ORDER — DIVALPROEX SODIUM 500 MG PO DR TAB: 1000.0000 mg | DELAYED_RELEASE_TABLET | ORAL | 0 refills | Status: DC

## 2021-04-25 NOTE — Progress Notes (Signed)
CSW added recommended resources to AVS due to the Pt being psych cleared. CSW consulted with ED SW regarding the Pt's return to ALF. ED SW confirmed that the Pt is eligible to return.   Damita Dunnings, MSW, LCSW-A  8:52 AM 04/25/2021

## 2021-04-25 NOTE — NC FL2 (Signed)
[place] MEDICAID FL2 LEVEL OF CARE SCREENING TOOL     IDENTIFICATION  Patient Name: David Davila. Birthdate: 1981-01-12 Sex: male Admission Date (Current Location): 04/13/2021  [place] Number:  [employer] and Address:  [facility]  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   TBI (traumatic brain injury) (HCC) 07/10/2016   Vitamin B12 deficiency 07/10/2016   Noncompliance with medication regimen 06/25/2016   Cocaine use disorder, mild, abuse (HCC) 06/25/2016   Cannabis use disorder, mild, abuse 06/25/2016   Paranoid schizophrenia (HCC) 06/24/2016    Orientation RESPIRATION BLADDER Height & Weight     Self, Time, Situation, Place  Normal Continent Weight: 120 lb (54.4 kg) Height:  6' (182.9 cm)  BEHAVIORAL SYMPTOMS/MOOD NEUROLOGICAL BOWEL NUTRITION STATUS      Continent Diet  AMBULATORY STATUS COMMUNICATION OF NEEDS Skin   Independent Verbally Normal                       Personal Care Assistance Level of Assistance  Bathing, Feeding, Dressing, Total care Bathing Assistance: Independent Feeding assistance: Independent Dressing Assistance: Independent Total Care Assistance: Independent   Functional Limitations Info  Sight, Hearing, Speech Sight Info: Adequate Hearing Info: Adequate Speech Info: Adequate    SPECIAL CARE FACTORS FREQUENCY                        Contractures Contractures Info: Not present    Additional Factors Info  Code Status, Allergies, Psychotropic Code Status Info: FULL Allergies Info: Zyprexa (olanzapine) and Invega sustenna (paliperidone palmitate er) Psychotropic Info: haloperidol (HALDOL) tablet 5 mg, LORazepam (ATIVAN) tablet 1 mg, mirtazapine (REMERON SOL-TAB) disintegrating tablet 15 mg, paliperidone (INVEGA SUSTENNA) injection 156 mg         Current Medications (04/25/2021):  This is the current hospital active medication list Current Facility-Administered Medications  Medication Dose Route Frequency Provider Last Rate Last Admin   benztropine (COGENTIN) tablet 1 mg  1 mg Oral BID Leevy-Johnson, Brooke A, NP   1 mg at 04/24/21 2144   Or   benztropine mesylate (COGENTIN) injection 1 mg  1 mg Intramuscular BID Leevy-Johnson, Brooke A, NP   1 mg at 04/22/21 1127   haloperidol (HALDOL) tablet 5 mg  5 mg Oral BID Leevy-Johnson, Brooke A, NP   5 mg at 04/24/21 2145   Or   haloperidol lactate (HALDOL) injection 5 mg  5 mg Intramuscular BID Leevy-Johnson, Brooke A, NP   5 mg at 04/22/21 1123   LORazepam (ATIVAN) tablet 1 mg  1 mg Oral QID Novella Olive, NP   1 mg at 04/24/21 2145   mirtazapine (REMERON SOL-TAB) disintegrating tablet 15 mg  15 mg Oral QHS Oneta Rack, NP   15 mg at 04/24/21 2145   ziprasidone (GEODON) injection 20 mg  20 mg Intramuscular Q6H PRN Zadie Rhine, MD  20 mg at 04/21/21 2318   Current Outpatient Medications  Medication Sig Dispense Refill   atorvastatin (LIPITOR) 10 MG tablet Take 1 tablet (10 mg total) by mouth daily at 6 PM. 30 tablet 0   benztropine (COGENTIN) 1 MG tablet Take 1 tablet (1 mg total) by mouth 2 (two) times daily. 60 tablet 0   divalproex (DEPAKOTE) 500 MG DR tablet Take 2 tablets (1,000 mg total) by mouth every morning. 60 tablet 0   haloperidol (HALDOL) 5 MG tablet Take 1 tablet (5 mg total) by mouth at bedtime. 30 tablet 0   loperamide  (IMODIUM) 2 MG capsule 1 capsule     mirtazapine (REMERON) 15 MG tablet Take 1 tablet (15 mg total) by mouth at bedtime. 30 tablet 0   OLANZapine (ZYPREXA) 10 MG tablet Take 1 tablet (10 mg total) by mouth 2 (two) times daily. 60 tablet 0   paliperidone (INVEGA SUSTENNA) 156 MG/ML SUSY injection Inject 1 mL (156 mg total) into the muscle once for 1 dose. (Patient not taking: Reported on 02/06/2021) 1 mL 0   paliperidone (INVEGA SUSTENNA) 234 MG/1.5ML SUSY injection Inject 234 mg into the muscle once for 1 dose. Next injection due 05/27/18 (Patient not taking: Reported on 02/06/2021) 1.5 mL 0   paliperidone (INVEGA) 6 MG 24 hr tablet Take 1 tablet (6 mg total) by mouth daily. (Patient not taking: No sig reported) 30 tablet 0   traZODone (DESYREL) 100 MG tablet Take 1 tablet (100 mg total) by mouth at bedtime as needed for sleep. 30 tablet 0     Discharge Medications: Please see discharge summary for a list of discharge medications.  Relevant Imaging Results:  Relevant Lab Results:   Additional Information SSN: [id number] [place]

## 2021-04-25 NOTE — Progress Notes (Signed)
CSW confirmed with [facility] pharmacy that they received scripts for pt. CSW spoke with Vertell Limber who states that pt can return to the facility and she will need a new fl2. CSW completed and printed Fl2. Steward Drone will pick it up tomorrow. Steward Drone confirms that pt can walk to the facility if he would like, pt knows the way and states it is very close to the hospital. TOC signing off.

## 2021-04-25 NOTE — Discharge Instructions (Signed)
Patient is recommended to continue psychiatric medication management and outpatient therapeutic services. Attached below are local providers within his area that can meet his psychiatric needs:   [facility]  [address], [place], [place] [address] [phone]  [facility] [address], [place], [place] [address] [phone]  [facility]  [address], [place], [place] [address] [phone]  [facility]  [address] [place], [place], [place]  [phone]

## 2021-04-25 NOTE — ED Notes (Signed)
Pt sitting up in bed at this time eating breakfast. Pt has been calm and cooperative thus far today.

## 2021-04-25 NOTE — ED Provider Notes (Signed)
Emergency Medicine Observation Re-evaluation Note  David Davila. is a 40 y.o. male, seen on rounds today.  Pt initially presented to the ED for complaints of V70.1 Currently, the patient is transfer pack to facility today.  Physical Exam  BP (!) 101/57 (BP Location: Right Arm)   Pulse 73   Temp 98.2 F (36.8 C) (Oral)   Resp 18   Ht 6' (1.829 m)   Wt 54.4 kg   SpO2 100%   BMI 16.27 kg/m  Physical Exam General: Calm Cardiac: Well perfused.  Lungs: Even, unlabored respirations.  Psych: Calm  ED Course / MDM  EKG:EKG Interpretation  Date/Time:  Friday April 13 2021 14:14:50 EDT Ventricular Rate:  74 PR Interval:  136 QRS Duration: 80 QT Interval:  394 QTC Calculation: 437 R Axis:   92 Text Interpretation: Normal sinus rhythm Rightward axis Septal infarct , age undetermined Abnormal ECG Confirmed by Norman Clay ([id number]) on 04/13/2021 3:03:34 PM  I have reviewed the labs performed to date as well as medications administered while in observation.  Recent changes in the last 24 hours include plan for discharge later today.  Plan  Current plan is for d/c back to facility. Will send refills of home medications to the pharmacy. David Davila. is under involuntary commitment.      Maia Plan, MD 04/25/21 1102

## 2021-05-01 NOTE — Progress Notes (Signed)
CSW printed pts AVS to add to packet that Lucia Bitter with pts independent living facility will be picking up today.

## 2021-06-14 ENCOUNTER — Other Ambulatory Visit: Payer: Self-pay

## 2021-06-14 ENCOUNTER — Encounter: Payer: Self-pay | Admitting: Emergency Medicine

## 2021-06-14 ENCOUNTER — Emergency Department
Admission: EM | Admit: 2021-06-14 | Discharge: 2021-06-22 | Disposition: A | Discharge status: Home / Self Care | Payer: Medicare Other | Attending: Emergency Medicine | Admitting: Emergency Medicine

## 2021-06-14 DIAGNOSIS — R451 Restlessness and agitation: Secondary | ICD-10-CM | POA: Diagnosis not present

## 2021-06-14 DIAGNOSIS — Z0000 Encounter for general adult medical examination without abnormal findings: Secondary | ICD-10-CM | POA: Insufficient documentation

## 2021-06-14 DIAGNOSIS — F200 Paranoid schizophrenia: Secondary | ICD-10-CM | POA: Diagnosis present

## 2021-06-14 DIAGNOSIS — Z139 Encounter for screening, unspecified: Secondary | ICD-10-CM

## 2021-06-14 DIAGNOSIS — Z79899 Other long term (current) drug therapy: Secondary | ICD-10-CM | POA: Diagnosis not present

## 2021-06-14 DIAGNOSIS — Y900 Blood alcohol level of less than 20 mg/100 ml: Secondary | ICD-10-CM | POA: Diagnosis not present

## 2021-06-14 DIAGNOSIS — F17210 Nicotine dependence, cigarettes, uncomplicated: Secondary | ICD-10-CM | POA: Insufficient documentation

## 2021-06-14 DIAGNOSIS — Z20822 Contact with and (suspected) exposure to covid-19: Secondary | ICD-10-CM | POA: Insufficient documentation

## 2021-06-14 LAB — CBC
HCT: 42.5 % (ref 39.0–52.0)
Hemoglobin: 14.3 g/dL (ref 13.0–17.0)
MCH: 30.6 pg (ref 26.0–34.0)
MCHC: 33.6 g/dL (ref 30.0–36.0)
MCV: 91 fL (ref 80.0–100.0)
Platelets: 183 10*3/uL (ref 150–400)
RBC: 4.67 MIL/uL (ref 4.22–5.81)
RDW: 12.1 % (ref 11.5–15.5)
WBC: 4 10*3/uL (ref 4.0–10.5)
nRBC: 0 % (ref 0.0–0.2)

## 2021-06-14 LAB — RESP PANEL BY RT-PCR (FLU A&B, COVID) ARPGX2
Influenza A by PCR: NEGATIVE
Influenza B by PCR: NEGATIVE
SARS Coronavirus 2 by RT PCR: NEGATIVE

## 2021-06-14 LAB — URINE DRUG SCREEN, QUALITATIVE (ARMC ONLY)
Amphetamines, Ur Screen: NOT DETECTED
Barbiturates, Ur Screen: NOT DETECTED
Benzodiazepine, Ur Scrn: NOT DETECTED
Cannabinoid 50 Ng, Ur ~~LOC~~: NOT DETECTED
Cocaine Metabolite,Ur ~~LOC~~: NOT DETECTED
MDMA (Ecstasy)Ur Screen: NOT DETECTED
Methadone Scn, Ur: NOT DETECTED
Opiate, Ur Screen: NOT DETECTED
Phencyclidine (PCP) Ur S: NOT DETECTED
Tricyclic, Ur Screen: NOT DETECTED

## 2021-06-14 LAB — COMPREHENSIVE METABOLIC PANEL
ALT: 13 U/L (ref 0–44)
AST: 24 U/L (ref 15–41)
Albumin: 4.2 g/dL (ref 3.5–5.0)
Alkaline Phosphatase: 68 U/L (ref 38–126)
Anion gap: 7 (ref 5–15)
BUN: 12 mg/dL (ref 6–20)
CO2: 30 mmol/L (ref 22–32)
Calcium: 9.7 mg/dL (ref 8.9–10.3)
Chloride: 101 mmol/L (ref 98–111)
Creatinine, Ser: 0.91 mg/dL (ref 0.61–1.24)
GFR, Estimated: >60 mL/min (ref >60)
Glucose, Bld: 108 mg/dL — ABNORMAL HIGH (ref 70–99)
Potassium: 4 mmol/L (ref 3.5–5.1)
Sodium: 138 mmol/L (ref 135–145)
Total Bilirubin: 2 mg/dL — ABNORMAL HIGH (ref 0.3–1.2)
Total Protein: 7.9 g/dL (ref 6.5–8.1)

## 2021-06-14 LAB — ETHANOL: Alcohol, Ethyl (B): <10 mg/dL (ref <10)

## 2021-06-14 LAB — ACETAMINOPHEN LEVEL: Acetaminophen (Tylenol), Serum: <10 ug/mL — ABNORMAL LOW (ref 10–30)

## 2021-06-14 LAB — SALICYLATE LEVEL: Salicylate Lvl: <7 mg/dL — ABNORMAL LOW (ref 7.0–30.0)

## 2021-06-14 MED ORDER — MIRTAZAPINE 15 MG PO TABS
15.0000 mg | ORAL_TABLET | Freq: Every day | ORAL | Status: DC
  Administered 2021-06-14 – 2021-06-19 (×5): 15 mg via ORAL
  Filled 2021-06-14 (×7): qty 1

## 2021-06-14 MED ORDER — DIVALPROEX SODIUM 500 MG PO DR TAB
1000.0000 mg | DELAYED_RELEASE_TABLET | ORAL | Status: DC
  Administered 2021-06-15 – 2021-06-22 (×4): 1000 mg via ORAL
  Filled 2021-06-14 (×7): qty 2

## 2021-06-14 MED ORDER — TRAZODONE HCL 100 MG PO TABS
100.0000 mg | ORAL_TABLET | Freq: Every evening | ORAL | Status: DC | PRN
  Administered 2021-06-16 – 2021-06-18 (×3): 100 mg via ORAL
  Filled 2021-06-14 (×4): qty 1

## 2021-06-14 MED ORDER — OLANZAPINE 10 MG PO TABS
10.0000 mg | ORAL_TABLET | Freq: Two times a day (BID) | ORAL | Status: DC
  Administered 2021-06-14 – 2021-06-22 (×10): 10 mg via ORAL
  Filled 2021-06-14 (×15): qty 1

## 2021-06-14 MED ORDER — BENZTROPINE MESYLATE 1 MG PO TABS
1.0000 mg | ORAL_TABLET | Freq: Two times a day (BID) | ORAL | Status: DC
  Administered 2021-06-14 – 2021-06-22 (×10): 1 mg via ORAL
  Filled 2021-06-14 (×15): qty 1

## 2021-06-14 MED ORDER — PALIPERIDONE PALMITATE ER 156 MG/ML IM SUSY
156.0000 mg | PREFILLED_SYRINGE | Freq: Once | INTRAMUSCULAR | Status: AC
  Administered 2021-06-14: 156 mg via INTRAMUSCULAR
  Filled 2021-06-14: qty 1

## 2021-06-14 MED ORDER — OLANZAPINE 5 MG PO TBDP
10.0000 mg | ORAL_TABLET | Freq: Once | ORAL | Status: AC
  Administered 2021-06-14: 10 mg via ORAL
  Filled 2021-06-14: qty 2

## 2021-06-14 MED ORDER — ATORVASTATIN CALCIUM 20 MG PO TABS
10.0000 mg | ORAL_TABLET | Freq: Every day | ORAL | Status: DC
  Administered 2021-06-14 – 2021-06-22 (×6): 10 mg via ORAL
  Filled 2021-06-14 (×6): qty 1

## 2021-06-14 MED ORDER — HALOPERIDOL 5 MG PO TABS
5.0000 mg | ORAL_TABLET | Freq: Every day | ORAL | Status: DC
  Administered 2021-06-14: 5 mg via ORAL
  Filled 2021-06-14: qty 1

## 2021-06-14 NOTE — ED Notes (Signed)
Report to Selena Batten, RN for care handoff

## 2021-06-14 NOTE — ED Notes (Signed)
Dr.Clapacs at bedside  

## 2021-06-14 NOTE — ED Notes (Signed)
Report received from [place], English as a second language teacher. Patient alert and oriented, warm and dry, and in no acute distress. Patient denies HI, VH and pain. Patient made aware of Q15 minute rounds and Psychologist, counselling presence for their safety. Patient instructed to come to this nurse with needs or concerns.

## 2021-06-14 NOTE — ED Notes (Signed)
Given dinner tray

## 2021-06-14 NOTE — ED Notes (Signed)
Snack way given to pt.

## 2021-06-14 NOTE — ED Triage Notes (Signed)
Pt comes into the ED via [facility] under IVC.  Pt apparently has known mental illnesses.  Pt appeared in front of court yesterday where he became aggressive and unable to be controlled.  Pt does have a legal guardian listed in the system.  Pt calm and cooperative at this time.  Per the DA they think the patient is a harm to himself and others seeing that he presented to them covered in blood.

## 2021-06-14 NOTE — ED Notes (Signed)
IVC, pend psych consult 

## 2021-06-14 NOTE — BH Assessment (Addendum)
Writer attempted to speak with patient to complete TTS Consult. Patient refused to talk. He stated he can only talk with the "first doctor." Patient kept blanket over his head.

## 2021-06-14 NOTE — ED Provider Notes (Signed)
[facility] Emergency Department Provider Note ____________________________________________   Event Date/Time   First MD Initiated Contact with Patient 06/14/21 1337     (approximate)  I have reviewed the triage vital signs and the nursing notes.  HISTORY  Chief Complaint Psychiatric Evaluation   HPI David Prats. is a 40 y.o. David Davila presents to the ED for evaluation of his mental health.   Chart review indicates various psychiatric diagnoses.   Patient presents to the ED under IVC from jail via the local sheriff's department.  I am told that he has been in custody in jail for 3 to 4 weeks in a private cell.  I reviewed his IVC paperwork, which includes the following: He reportedly had a hearing in front of a judge yesterday and was quite disruptive.  He was reportedly found "covered in blood" this morning and therefore deemed a danger to himself or others, placed under IVC and brought to the ER for evaluation.  Here in the ED, patient is quite reticent and does not provide much information.  He reports that he feels fine.  He reports he feels pain in his arm, where he was stuck for screening blood work.  He denies any suicidality or AV hallucinations.  Thinks the year is 2025 and he refuses to open his mouth because we might try to feed him. Says his name is David Davila and that he "woke up with this name."  Reports he lives with a guy named Alvin. History limited due to his mental status and refusal to participate.  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 No pertinent surgical history.  Prior to Admission medications   Medication Sig Start Date End Date Taking? Authorizing Provider  atorvastatin (LIPITOR) 10 MG tablet Take 1 tablet (10 mg total) by mouth daily at 6 PM. 04/25/21 05/25/21  Long, Arlyss Repress, MD  benztropine (COGENTIN) 1 MG tablet Take 1 tablet (1 mg total) by mouth 2 (two) times daily. 04/25/21 05/25/21  Long, Arlyss Repress, MD  divalproex (DEPAKOTE) 500 MG DR tablet Take 2 tablets (1,000 mg total) by mouth every morning. 04/25/21 05/25/21  Long, Arlyss Repress, MD  haloperidol (HALDOL) 5 MG tablet Take 1 tablet (5 mg total) by mouth at bedtime. 04/25/21 05/25/21  Long, Arlyss Repress, MD  loperamide (IMODIUM) 2 MG capsule 1 capsule Patient not taking: Reported on 06/14/2021    [provider]  mirtazapine (REMERON) 15 MG tablet Take 1 tablet (15 mg total) by mouth at bedtime. 04/25/21 05/25/21  Long, Arlyss Repress, MD  OLANZapine (ZYPREXA) 10 MG tablet Take 1 tablet (10 mg total) by mouth 2 (two) times daily. 04/25/21 05/25/21  Long, Arlyss Repress, MD  paliperidone (INVEGA SUSTENNA) 156 MG/ML SUSY injection Inject 1 mL (156 mg total) into the muscle once for 1 dose. Patient not taking: Reported on 02/06/2021 04/27/19 04/27/19  Sharman Cheek, MD  paliperidone (INVEGA SUSTENNA) 234 MG/1.5ML SUSY injection Inject 234 mg into the muscle once for 1 dose. Next injection due 05/27/18 Patient not taking: Reported on 02/06/2021 09/02/18 09/02/18  Willy Eddy, MD  paliperidone (INVEGA) 6 MG 24 hr tablet Take 1 tablet (6 mg total) by mouth daily. Patient not taking: No sig reported 04/18/19   Charm Rings, NP  traZODone (DESYREL) 100 MG tablet Take 1 tablet (100 mg total) by mouth at bedtime as needed for sleep. Patient not taking:  Reported on 06/14/2021 04/25/21   Long, Arlyss Repress, MD    Allergies Hinda Glatter sustenna [paliperidone palmitate er] and Zyprexa [olanzapine]  History reviewed. No pertinent family history.  Social History Social History   Tobacco Use   Smoking status: Every Day    Packs/day: 1.00    Types: Cigarettes   Smokeless tobacco: Never  Substance Use Topics   Alcohol use: No   Drug use: Not Currently    Review of Systems  Unable to accurately assess due to patient's mental status and refusal to participate. ____________________________________________   PHYSICAL EXAM:  VITAL SIGNS: Vitals:   06/14/21 1326  BP: 127/78  Pulse: 70  Resp: 16  Temp: 97.6 F (36.4 C)  SpO2: 99%    Constitutional: Alert. Well appearing and in no acute distress. Eyes: Conjunctivae are normal. PERRL. EOMI. Head: Atraumatic. Nose: No congestion/rhinnorhea. Mouth/Throat: Mucous membranes are moist.  Oropharynx non-erythematous. Neck: No stridor. No cervical spine tenderness to palpation. Cardiovascular: Normal rate, regular rhythm. Grossly normal heart sounds.  Good peripheral circulation. Respiratory: Normal respiratory effort.  No retractions. Lungs CTAB. Gastrointestinal: Soft , nondistended, nontender to palpation. No CVA tenderness. Musculoskeletal: No lower extremity tenderness nor edema.  No joint effusions. No signs of acute trauma. Neurologic:  Normal speech and language. No gross focal neurologic deficits are appreciated. No gait instability noted. Skin:  Skin is warm, dry and intact. No rash noted. Psychiatric: Mood and affect are normal. Speech and behavior are normal. ____________________________________________   LABS (all labs ordered are listed, but only abnormal results are displayed)  Labs Reviewed  COMPREHENSIVE METABOLIC PANEL - Abnormal; Notable for the following components:      Result Value   Glucose, Bld 108 (*)    Total Bilirubin 2.0 (*)    All other components within normal  limits  RESP PANEL BY RT-PCR (FLU A&B, COVID) ARPGX2  ETHANOL  CBC  SALICYLATE LEVEL  ACETAMINOPHEN LEVEL  URINE DRUG SCREEN, QUALITATIVE ([facility] ONLY)   ____________________________________________  12 Lead EKG   ____________________________________________  RADIOLOGY  ED MD interpretation:    Official radiology report(s): No results found.  ____________________________________________   PROCEDURES and INTERVENTIONS  Procedure(s) performed (including Critical Care):  Procedures  Medications  OLANZapine zydis (ZYPREXA) disintegrating tablet 10 mg (10 mg Oral Given 06/14/21 1407)    ____________________________________________   MDM / ED COURSE   40 year old male presents to the ED under IVC for evaluation of his mental health.  Normal vitals and no evidence of medical pathology to preclude psychiatric evaluation and disposition.  We will hold under IVC and have psychiatry evaluate him.  Clinical Course as of 06/14/21 1409  Thu Jun 14, 2021  1407 The patient has been placed in psychiatric observation due to the need to provide a safe environment for the patient while obtaining psychiatric consultation and evaluation, as well as ongoing medical and medication management to treat the patient's condition.  The patient has been placed under full IVC at this  time.   [DS]    Clinical Course User Index [DS] Delton Prairie, MD    ____________________________________________   FINAL CLINICAL IMPRESSION(S) / ED DIAGNOSES  Final diagnoses:  Encounter for medical screening examination  Agitated     ED Discharge Orders     None        Darshawn Boateng Katrinka Blazing   Note:  This document was prepared using Dragon voice recognition software and may include unintentional dictation errors.    Delton Prairie, MD 06/14/21 1409

## 2021-06-14 NOTE — BH Assessment (Addendum)
Per Psyc NP Gabriel Cirri patient is recommended for Inpatient treatment.  This writer attempted to contact [facility] Sages [phone] (person to contact if patient needs to be discharged) to try to obtain further information regarding if patient is under the care and supervision of the prison. No answer, a HIPAA compliant voicemail was left to return phone call.  Writer attempted to contact patient's legal guardian Ane Payment [phone] but no answer, a HIPAA compliant voicemail was left to return phone call  TTS discussed this with Associate Director Demetria in regards to the patient's next steps for referral out or Inpatient on BMU. Associate Director reports based on the provider's assessment more information is needed, patient is not willing to talk with psych team currently. Plan is to obtain more information from prison staff or legal guardian to attempt to complete a full assessment so next steps can begin for placement.    Update: TTS received phone call from [facility] [phone] who reports "He was in our jail and then the process of court proceedings, the juge wanted him evaluated to see if he was okay with continuing his trail, upon completion of his psyc evaluation his bond goes back into effect. If yall found that he needs to be at another facility I don't know how that works."  [facility] [employer] is able to be contacted at [phone] until 2am 06/15/21 afterwards contact would be to [facility] [phone] (ask for sargent on duty in the jail)

## 2021-06-14 NOTE — Consult Note (Signed)
[facility] Face-to-Face Psychiatry Consult   Reason for Consult:  psychosis Referring Physician:  Katrinka Blazing Patient Identification: David Davila. MRN:  [id number] Principal Diagnosis: Paranoid schizophrenia (HCC) Diagnosis:  Principal Problem:   Paranoid schizophrenia (HCC)   Total Time spent with patient: 30 minutes  Subjective:   David Balles. is a 40 y.o. male patient admitted with psychosis  HPI: Patient presented to ED from jail via police under IVC. According to IVC documentation, patient presented to court for his trial from jail and and judge placed "Involuntary Commitment Custody Order Defendant Found Incapable To Proceed." Findings on this document state "Based upon report of probation office, defendant is unable to be supervised."   Writer reviewed patient's chart. He has previously taken [place], last known injection while in the ED in Sept 2022.  Patient refused to talk to writer, keeping the blanket over his head. When attempting to evaluate with Dr. Toni Amend, patient only said that he needed some food and then that he killed his family, then said we killed his family. Patient then put the blanket back over his head and said that he was done talking.   Dr. Toni Amend contacted guardian Ane Payment [phone]) who stated that patient was in jail d/t a probation violation.     Writer reviewed medications with pharmacist, Darl Pikes, and re-ordered as appropriate per instructions of Dr. Toni Amend.    Past Psychiatric History: Paranoid schizophrenia  Risk to Self:   Risk to Others:   Prior Inpatient Therapy:   Prior Outpatient Therapy:    Past Medical History:  Past Medical History:  Diagnosis Date   Acute psychosis (HCC)    Antisocial personality disorder (HCC)    Cannabis abuse    Past   Paranoid schizophrenia (HCC)    Schizoaffective disorder, bipolar type (HCC)    History reviewed. No pertinent surgical history. Family History: History reviewed.  No pertinent family history. Family Psychiatric  History: unknown Social History:  Social History   Substance and Sexual Activity  Alcohol Use No     Social History   Substance and Sexual Activity  Drug Use Not Currently    Social History   Socioeconomic History   Marital status: Single    Spouse name: Not on file   Number of children: Not on file   Years of education: Not on file   Highest education level: Not on file  Occupational History   Not on file  Tobacco Use   Smoking status: Every Day    Packs/day: 1.00    Types: Cigarettes   Smokeless tobacco: Never  Substance and Sexual Activity   Alcohol use: No   Drug use: Not Currently   Sexual activity: Not Currently  Other Topics Concern   Not on file  Social History Narrative   Not on file   Social Determinants of Health   Financial Resource Strain: Not on file  Food Insecurity: Not on file  Transportation Needs: Not on file  Physical Activity: Not on file  Stress: Not on file  Social Connections: Not on file   Additional Social History:    Allergies:   Allergies  Allergen Reactions   Invega Sustenna [Paliperidone Palmitate Er] Other (See Comments)    Caused drop in WBC and neutropenia following injection in August 2019 (he was not on other medications that could have caused this drop). Has tolerated it in the past (with no drop in WBC). Repeat CBC a few days later in August then showed improved  ANC and WBC. Would recommend monitoring CBC while on this medication   Zyprexa [Olanzapine] Other (See Comments)    Reaction: unknown    Labs:  Results for orders placed or performed during the hospital encounter of 06/14/21 (from the past 48 hour(s))  Comprehensive metabolic panel     Status: Abnormal   Collection Time: 06/14/21  1:33 PM  Result Value Ref Range   Sodium 138 135 - 145 mmol/L   Potassium 4.0 3.5 - 5.1 mmol/L   Chloride 101 98 - 111 mmol/L   CO2 30 22 - 32 mmol/L   Glucose, Bld 108 (H) 70 - 99  mg/dL    Comment: Glucose reference range applies only to samples taken after fasting for at least 8 hours.   BUN 12 6 - 20 mg/dL   Creatinine, Ser 7.82 0.61 - 1.24 mg/dL   Calcium 9.7 8.9 - 95.6 mg/dL   Total Protein 7.9 6.5 - 8.1 g/dL   Albumin 4.2 3.5 - 5.0 g/dL   AST 24 15 - 41 U/L   ALT 13 0 - 44 U/L   Alkaline Phosphatase 68 38 - 126 U/L   Total Bilirubin 2.0 (H) 0.3 - 1.2 mg/dL   GFR, Estimated >21 >30 mL/min    Comment: (NOTE) Calculated using the CKD-EPI Creatinine Equation (2021)    Anion gap 7 5 - 15    Comment: Performed at [facility], [address] [place], [place] [address]  Ethanol     Status: None   Collection Time: 06/14/21  1:33 PM  Result Value Ref Range   Alcohol, Ethyl (B) <10 <10 mg/dL    Comment: (NOTE) Lowest detectable limit for serum alcohol is 10 mg/dL.  For medical purposes only. Performed at [facility], [address] [place], [place] [address]   Salicylate level     Status: Abnormal   Collection Time: 06/14/21  1:33 PM  Result Value Ref Range   Salicylate Lvl <7.0 (L) 7.0 - 30.0 mg/dL    Comment: Performed at [facility], [address] [place], [place] [address]  Acetaminophen level     Status: Abnormal   Collection Time: 06/14/21  1:33 PM  Result Value Ref Range   Acetaminophen (Tylenol), Serum <10 (L) 10 - 30 ug/mL    Comment: (NOTE) Therapeutic concentrations vary significantly. A range of 10-30 ug/mL  may be an effective concentration for many patients. However, some  are best treated at concentrations outside of this range. Acetaminophen concentrations >150 ug/mL at 4 hours after ingestion  and >50 ug/mL at 12 hours after ingestion are often associated with  toxic reactions.  Performed at [facility], [address] [place], [place] [address]   cbc     Status: None   Collection Time: 06/14/21  1:33 PM  Result Value Ref Range   WBC 4.0 4.0 - 10.5 K/uL   RBC 4.67 4.22 - 5.81 MIL/uL    Hemoglobin 14.3 13.0 - 17.0 g/dL   HCT 01.0 27.2 - 53.6 %   MCV 91.0 80.0 - 100.0 fL   MCH 30.6 26.0 - 34.0 pg   MCHC 33.6 30.0 - 36.0 g/dL   RDW 64.4 03.4 - 74.2 %   Platelets 183 150 - 400 K/uL   nRBC 0.0 0.0 - 0.2 %    Comment: Performed at [facility], [address] [place], [place] [address]  Resp Panel by RT-PCR (Flu A&B, Covid) Nasopharyngeal Swab     Status:  None   Collection Time: 06/14/21  2:10 PM   Specimen: Nasopharyngeal Swab; Nasopharyngeal(NP) swabs in vial transport medium  Result Value Ref Range   SARS Coronavirus 2 by RT PCR NEGATIVE NEGATIVE    Comment: (NOTE) SARS-CoV-2 target nucleic acids are NOT DETECTED.  The SARS-CoV-2 RNA is generally detectable in upper respiratory specimens during the acute phase of infection. The lowest concentration of SARS-CoV-2 viral copies this assay can detect is 138 copies/mL. A negative result does not preclude SARS-Cov-2 infection and should not be used as the sole basis for treatment or other patient management decisions. A negative result may occur with  improper specimen collection/handling, submission of specimen other than nasopharyngeal swab, presence of viral mutation(s) within the areas targeted by this assay, and inadequate number of viral copies(<138 copies/mL). A negative result must be combined with clinical observations, patient history, and epidemiological information. The expected result is Negative.  Fact Sheet for Patients:  BloggerCourse.com  Fact Sheet for Healthcare Providers:  SeriousBroker.it  This test is no t yet approved or cleared by the [place] FDA and  has been authorized for detection and/or diagnosis of SARS-CoV-2 by FDA under an Emergency Use Authorization (EUA). This EUA will remain  in effect (meaning this test can be used) for the duration of the COVID-19 declaration under Section 564(b)(1) of the Act, 21 U.S.C.section  360bbb-3(b)(1), unless the authorization is terminated  or revoked sooner.       Influenza A by PCR NEGATIVE NEGATIVE   Influenza B by PCR NEGATIVE NEGATIVE    Comment: (NOTE) The Xpert Xpress SARS-CoV-2/FLU/RSV plus assay is intended as an aid in the diagnosis of influenza from Nasopharyngeal swab specimens and should not be used as a sole basis for treatment. Nasal washings and aspirates are unacceptable for Xpert Xpress SARS-CoV-2/FLU/RSV testing.  Fact Sheet for Patients: BloggerCourse.com  Fact Sheet for Healthcare Providers: SeriousBroker.it  This test is not yet approved or cleared by the [place] FDA and has been authorized for detection and/or diagnosis of SARS-CoV-2 by FDA under an Emergency Use Authorization (EUA). This EUA will remain in effect (meaning this test can be used) for the duration of the COVID-19 declaration under Section 564(b)(1) of the Act, 21 U.S.C. section 360bbb-3(b)(1), unless the authorization is terminated or revoked.  Performed at [facility], [address] [place], [place] [address]     Current Facility-Administered Medications  Medication Dose Route Frequency Provider Last Rate Last Admin   atorvastatin (LIPITOR) tablet 10 mg  10 mg Oral q1800 Vanetta Mulders, NP       benztropine (COGENTIN) tablet 1 mg  1 mg Oral BID Vanetta Mulders, NP       [START ON 06/15/2021] divalproex (DEPAKOTE) DR tablet 1,000 mg  1,000 mg Oral BH-q7a Cartel Mauss F, NP       haloperidol (HALDOL) tablet 5 mg  5 mg Oral QHS Sahra Converse F, NP       mirtazapine (REMERON) tablet 15 mg  15 mg Oral QHS Shannen Flansburg, Nickola Major, NP       OLANZapine (ZYPREXA) tablet 10 mg  10 mg Oral BID Gabriel Cirri F, NP       traZODone (DESYREL) tablet 100 mg  100 mg Oral QHS PRN Vanetta Mulders, NP       Current Outpatient Medications  Medication Sig Dispense Refill   atorvastatin (LIPITOR) 10 MG  tablet Take 1 tablet (10 mg total) by mouth daily at 6 PM. 30 tablet 0  benztropine (COGENTIN) 1 MG tablet Take 1 tablet (1 mg total) by mouth 2 (two) times daily. 60 tablet 0   divalproex (DEPAKOTE) 500 MG DR tablet Take 2 tablets (1,000 mg total) by mouth every morning. 60 tablet 0   haloperidol (HALDOL) 5 MG tablet Take 1 tablet (5 mg total) by mouth at bedtime. 30 tablet 0   loperamide (IMODIUM) 2 MG capsule 1 capsule (Patient not taking: Reported on 06/14/2021)     mirtazapine (REMERON) 15 MG tablet Take 1 tablet (15 mg total) by mouth at bedtime. 30 tablet 0   OLANZapine (ZYPREXA) 10 MG tablet Take 1 tablet (10 mg total) by mouth 2 (two) times daily. 60 tablet 0   paliperidone (INVEGA SUSTENNA) 156 MG/ML SUSY injection Inject 1 mL (156 mg total) into the muscle once for 1 dose. (Patient not taking: Reported on 02/06/2021) 1 mL 0   paliperidone (INVEGA SUSTENNA) 234 MG/1.5ML SUSY injection Inject 234 mg into the muscle once for 1 dose. Next injection due 05/27/18 (Patient not taking: Reported on 02/06/2021) 1.5 mL 0   paliperidone (INVEGA) 6 MG 24 hr tablet Take 1 tablet (6 mg total) by mouth daily. (Patient not taking: No sig reported) 30 tablet 0   traZODone (DESYREL) 100 MG tablet Take 1 tablet (100 mg total) by mouth at bedtime as needed for sleep. (Patient not taking: Reported on 06/14/2021) 30 tablet 0    Musculoskeletal: Strength & Muscle Tone: within normal limits Gait & Station: normal Patient leans: N/A            Psychiatric Specialty Exam:  Presentation  General Appearance: Disheveled  Eye Contact:Minimal  Speech:Clear and Coherent  Speech Volume:Decreased  Handedness:Right   Mood and Affect  Mood:Irritable  Affect:Restricted   Thought Process  Thought Processes:Disorganized  Descriptions of Associations:Loose  Orientation:Partial  Thought Content:Illogical; Tangential  History of Schizophrenia/Schizoaffective disorder:No data recorded Duration  of Psychotic Symptoms:No data recorded Hallucinations:No data recorded Ideas of Reference:Delusions; Paranoia  Suicidal Thoughts:Suicidal Thoughts: -- (UTA) Homicidal Thoughts:Homicidal Thoughts: -- (UTA)  Sensorium  Memory:-- Rich Reining)  Judgment:Impaired  Insight:None   Executive Functions  Concentration:Poor  Attention Span:Poor  Recall:Poor  Fund of Knowledge:Poor  Language:Poor   Psychomotor Activity  Psychomotor Activity:Psychomotor Activity: Normal  Assets  Assets:Resilience   Sleep  Sleep:No data recorded  Physical Exam: Physical Exam Vitals and nursing note reviewed.  HENT:     Head: Normocephalic.     Nose: No congestion or rhinorrhea.  Eyes:     General:        Right eye: No discharge.        Left eye: No discharge.  Cardiovascular:     Rate and Rhythm: Normal rate.  Pulmonary:     Effort: Pulmonary effort is normal.  Musculoskeletal:        General: Normal range of motion.     Cervical back: Normal range of motion.  Skin:    General: Skin is dry.  Neurological:     Mental Status: He is alert.  Psychiatric:        Attention and Perception: He is inattentive.        Mood and Affect: Affect is flat and inappropriate.        Speech: Speech normal.        Behavior: Behavior is uncooperative and withdrawn.        Thought Content: Thought content is paranoid and delusional.   Review of Systems  Psychiatric/Behavioral:         Paranoid  schizophrenia  All other systems reviewed and are negative. Blood pressure 127/78, pulse 70, temperature 97.6 F (36.4 C), temperature source Oral, resp. rate 16, height 6' (1.829 m), weight 54.4 kg, SpO2 99 %. Body mass index is 16.27 kg/m.  Treatment Plan Summary: Daily contact with patient to assess and evaluate symptoms and progress in treatment and Medication management. Plan for this 40 year old male with paranoid schizophrenia is to resume his medications. Pharmacist recommends not starting oral Invega at  this time.   Disposition: Recommend psychiatric Inpatient admission when medically cleared.  Vanetta Mulders, NP 06/14/2021 6:56 PM

## 2021-06-14 NOTE — ED Notes (Signed)
Pt. To BHU from ED ambulatory without difficulty, to room  BHU 2. Report from [facility]. Pt. Is alert and oriented, warm and dry in no distress. Pt. Denies SI, HI, and AVH. Pt. Calm and cooperative. Pt. Made aware of security cameras and Q15 minute rounds. Pt. Encouraged to let Nursing staff know of any concerns or needs.   ENVIRONMENTAL ASSESSMENT Potentially harmful objects out of patient reach: Yes.   Personal belongings secured: Yes.   Patient dressed in hospital provided attire only: Yes.   Plastic bags out of patient reach: Yes.   Patient care equipment (cords, cables, call bells, lines, and drains) shortened, removed, or accounted for: Yes.   Equipment and supplies removed from bottom of stretcher: Yes.   Potentially toxic materials out of patient reach: Yes.   Sharps container removed or out of patient reach: Yes.

## 2021-06-14 NOTE — ED Notes (Signed)
EDP speaking with patient.  

## 2021-06-14 NOTE — ED Notes (Signed)
Offered snack, declined. Pt accepted a ginger ale.

## 2021-06-15 NOTE — BH Assessment (Addendum)
Patient referred to [facility], pending review.   Pt. referred to [facility].  Verbal screening completed with [facility]([phone]), information faxed and confirmed it was received.  Update: 06/16/21 5:35am-TTS contacted [facility] back, staff reports referral is still under review and to contact back after 9am to check on the status

## 2021-06-15 NOTE — ED Notes (Signed)
Pt agitated anytime staff enters his room.  Pt refuses medication, VS and lunch tray.

## 2021-06-15 NOTE — ED Notes (Signed)
Entered room with lunch patient stated get out of my room now ! Now!

## 2021-06-15 NOTE — ED Notes (Signed)
Hourly rounding reveals patient in room. No complaints, stable, in no acute distress. Q15 minute rounds and monitoring via Security Cameras to continue. 

## 2021-06-15 NOTE — ED Notes (Signed)
NT entered pt's room to deliver his breakfast tray.  Pt stated, "I didn't ask for no breakfast and I'm not going to eat your breakfast.  Get out of my room!"  Pt also refused VS.    *pt observed on camera eating his biscuit

## 2021-06-15 NOTE — ED Notes (Signed)
Pt to nurse's station requesting to use the bathroom.  Pt given his lunch tray and agreeable to taking his morning medications.

## 2021-06-15 NOTE — ED Notes (Addendum)
RN attempted to take pt's VS in dayroom with security present on unit.  Pt's speech was unintelligible and he walked back to his room.  Pt appears to be responding to internal stimuli.

## 2021-06-15 NOTE — Consult Note (Signed)
[facility] Face-to-Face Psychiatry Consult   Reason for Consult:  psych follow-up Referring Physician:  Dr Roxan Hockey Patient Identification: David Davila. MRN:  [id number] Principal Diagnosis: Paranoid schizophrenia (HCC) Diagnosis:  Principal Problem:   Paranoid schizophrenia (HCC)   Total Time spent with patient: 15 minutes  David Davila. is a 40 y.o. male patient admitted with psychosis.  Came to interview the patient and reassess re need for inpatient psych admission. Patient refused to talk to me and to [facility] counselor. Per nursing report, patient refused psych medications this morning, then took it later. He remains very guarded and not open to conversations.  Per Psyc NP Gabriel Cirri note, patient is recommended for Inpatient treatment. I will keep the same recommendations for now.    Past Medical History:  Past Medical History:  Diagnosis Date   Acute psychosis (HCC)    Antisocial personality disorder (HCC)    Cannabis abuse    Past   Paranoid schizophrenia (HCC)    Schizoaffective disorder, bipolar type (HCC)    History reviewed. No pertinent surgical history. Family History: History reviewed. No pertinent family history. Family Psychiatric  History: n/a Social History:  Social History   Substance and Sexual Activity  Alcohol Use No     Social History   Substance and Sexual Activity  Drug Use Not Currently    Social History   Socioeconomic History   Marital status: Single    Spouse name: Not on file   Number of children: Not on file   Years of education: Not on file   Highest education level: Not on file  Occupational History   Not on file  Tobacco Use   Smoking status: Every Day    Packs/day: 1.00    Types: Cigarettes   Smokeless tobacco: Never  Substance and Sexual Activity   Alcohol use: No   Drug use: Not Currently   Sexual activity: Not Currently  Other Topics Concern   Not on file  Social History Narrative   Not on file    Social Determinants of Health   Financial Resource Strain: Not on file  Food Insecurity: Not on file  Transportation Needs: Not on file  Physical Activity: Not on file  Stress: Not on file  Social Connections: Not on file   Additional Social History:    Allergies:   Allergies  Allergen Reactions   Invega Sustenna [Paliperidone Palmitate Er] Other (See Comments)    Caused drop in WBC and neutropenia following injection in August 2019 (he was not on other medications that could have caused this drop). Has tolerated it in the past (with no drop in WBC). Repeat CBC a few days later in August then showed improved ANC and WBC. Would recommend monitoring CBC while on this medication   Zyprexa [Olanzapine] Other (See Comments)    Reaction: unknown    Labs:  Results for orders placed or performed during the hospital encounter of 06/14/21 (from the past 48 hour(s))  Comprehensive metabolic panel     Status: Abnormal   Collection Time: 06/14/21  1:33 PM  Result Value Ref Range   Sodium 138 135 - 145 mmol/L   Potassium 4.0 3.5 - 5.1 mmol/L   Chloride 101 98 - 111 mmol/L   CO2 30 22 - 32 mmol/L   Glucose, Bld 108 (H) 70 - 99 mg/dL    Comment: Glucose reference range applies only to samples taken after fasting for at least 8 hours.   BUN 12 6 -  20 mg/dL   Creatinine, Ser 8.67 0.61 - 1.24 mg/dL   Calcium 9.7 8.9 - 54.4 mg/dL   Total Protein 7.9 6.5 - 8.1 g/dL   Albumin 4.2 3.5 - 5.0 g/dL   AST 24 15 - 41 U/L   ALT 13 0 - 44 U/L   Alkaline Phosphatase 68 38 - 126 U/L   Total Bilirubin 2.0 (H) 0.3 - 1.2 mg/dL   GFR, Estimated >92 >01 mL/min    Comment: (NOTE) Calculated using the CKD-EPI Creatinine Equation (2021)    Anion gap 7 5 - 15    Comment: Performed at [facility], [address] [place], [place] [address]  Ethanol     Status: None   Collection Time: 06/14/21  1:33 PM  Result Value Ref Range   Alcohol, Ethyl (B) <10 <10 mg/dL    Comment: (NOTE) Lowest  detectable limit for serum alcohol is 10 mg/dL.  For medical purposes only. Performed at [facility], [address] [place], [place] [address]   Salicylate level     Status: Abnormal   Collection Time: 06/14/21  1:33 PM  Result Value Ref Range   Salicylate Lvl <7.0 (L) 7.0 - 30.0 mg/dL    Comment: Performed at [facility], [address] [place], [place] [address]  Acetaminophen level     Status: Abnormal   Collection Time: 06/14/21  1:33 PM  Result Value Ref Range   Acetaminophen (Tylenol), Serum <10 (L) 10 - 30 ug/mL    Comment: (NOTE) Therapeutic concentrations vary significantly. A range of 10-30 ug/mL  may be an effective concentration for many patients. However, some  are best treated at concentrations outside of this range. Acetaminophen concentrations >150 ug/mL at 4 hours after ingestion  and >50 ug/mL at 12 hours after ingestion are often associated with  toxic reactions.  Performed at [facility], [address] [place], [place] [address]   cbc     Status: None   Collection Time: 06/14/21  1:33 PM  Result Value Ref Range   WBC 4.0 4.0 - 10.5 K/uL   RBC 4.67 4.22 - 5.81 MIL/uL   Hemoglobin 14.3 13.0 - 17.0 g/dL   HCT 15.8 30.9 - 40.7 %   MCV 91.0 80.0 - 100.0 fL   MCH 30.6 26.0 - 34.0 pg   MCHC 33.6 30.0 - 36.0 g/dL   RDW 68.0 88.1 - 10.3 %   Platelets 183 150 - 400 K/uL   nRBC 0.0 0.0 - 0.2 %    Comment: Performed at [facility], [address] [place], [place] [address]  Resp Panel by RT-PCR (Flu A&B, Covid) Nasopharyngeal Swab     Status: None   Collection Time: 06/14/21  2:10 PM   Specimen: Nasopharyngeal Swab; Nasopharyngeal(NP) swabs in vial transport medium  Result Value Ref Range   SARS Coronavirus 2 by RT PCR NEGATIVE NEGATIVE    Comment: (NOTE) SARS-CoV-2 target nucleic acids are NOT DETECTED.  The SARS-CoV-2 RNA is generally detectable in upper respiratory specimens during the acute phase of  infection. The lowest concentration of SARS-CoV-2 viral copies this assay can detect is 138 copies/mL. A negative result does not preclude SARS-Cov-2 infection and should not be used as the sole basis for treatment or other patient management decisions. A negative result may occur with  improper specimen collection/handling, submission of specimen other than nasopharyngeal swab, presence of viral mutation(s) within the areas targeted by this assay, and inadequate number of viral  copies(<138 copies/mL). A negative result must be combined with clinical observations, patient history, and epidemiological information. The expected result is Negative.  Fact Sheet for Patients:  BloggerCourse.com  Fact Sheet for Healthcare Providers:  SeriousBroker.it  This test is no t yet approved or cleared by the [place] FDA and  has been authorized for detection and/or diagnosis of SARS-CoV-2 by FDA under an Emergency Use Authorization (EUA). This EUA will remain  in effect (meaning this test can be used) for the duration of the COVID-19 declaration under Section 564(b)(1) of the Act, 21 U.S.C.section 360bbb-3(b)(1), unless the authorization is terminated  or revoked sooner.       Influenza A by PCR NEGATIVE NEGATIVE   Influenza B by PCR NEGATIVE NEGATIVE    Comment: (NOTE) The Xpert Xpress SARS-CoV-2/FLU/RSV plus assay is intended as an aid in the diagnosis of influenza from Nasopharyngeal swab specimens and should not be used as a sole basis for treatment. Nasal washings and aspirates are unacceptable for Xpert Xpress SARS-CoV-2/FLU/RSV testing.  Fact Sheet for Patients: BloggerCourse.com  Fact Sheet for Healthcare Providers: SeriousBroker.it  This test is not yet approved or cleared by the [place] FDA and has been authorized for detection and/or diagnosis of SARS-CoV-2 by FDA  under an Emergency Use Authorization (EUA). This EUA will remain in effect (meaning this test can be used) for the duration of the COVID-19 declaration under Section 564(b)(1) of the Act, 21 U.S.C. section 360bbb-3(b)(1), unless the authorization is terminated or revoked.  Performed at [facility], [address] [place], [place] [address]   Urine Drug Screen, Qualitative     Status: None   Collection Time: 06/14/21 10:12 PM  Result Value Ref Range   Tricyclic, Ur Screen NONE DETECTED NONE DETECTED   Amphetamines, Ur Screen NONE DETECTED NONE DETECTED   MDMA (Ecstasy)Ur Screen NONE DETECTED NONE DETECTED   Cocaine Metabolite,Ur [place] NONE DETECTED NONE DETECTED   Opiate, Ur Screen NONE DETECTED NONE DETECTED   Phencyclidine (PCP) Ur S NONE DETECTED NONE DETECTED   Cannabinoid 50 Ng, Ur [place] NONE DETECTED NONE DETECTED   Barbiturates, Ur Screen NONE DETECTED NONE DETECTED   Benzodiazepine, Ur Scrn NONE DETECTED NONE DETECTED   Methadone Scn, Ur NONE DETECTED NONE DETECTED    Comment: (NOTE) Tricyclics + metabolites, urine    Cutoff 1000 ng/mL Amphetamines + metabolites, urine  Cutoff 1000 ng/mL MDMA (Ecstasy), urine              Cutoff 500 ng/mL Cocaine Metabolite, urine          Cutoff 300 ng/mL Opiate + metabolites, urine        Cutoff 300 ng/mL Phencyclidine (PCP), urine         Cutoff 25 ng/mL Cannabinoid, urine                 Cutoff 50 ng/mL Barbiturates + metabolites, urine  Cutoff 200 ng/mL Benzodiazepine, urine              Cutoff 200 ng/mL Methadone, urine                   Cutoff 300 ng/mL  The urine drug screen provides only a preliminary, unconfirmed analytical test result and should not be used for non-medical purposes. Clinical consideration and professional judgment should be applied to any positive drug screen result due to possible interfering substances. A more specific alternate chemical method must be used in order to obtain a confirmed analytical  result.  Gas chromatography / mass spectrometry (GC/MS) is the preferred confirm atory method. Performed at [facility], [address] [place], [place] [address]     Current Facility-Administered Medications  Medication Dose Route Frequency Provider Last Rate Last Admin   atorvastatin (LIPITOR) tablet 10 mg  10 mg Oral q1800 Vanetta Mulders, NP   10 mg at 06/14/21 2137   benztropine (COGENTIN) tablet 1 mg  1 mg Oral BID Gabriel Cirri F, NP   1 mg at 06/15/21 1349   divalproex (DEPAKOTE) DR tablet 1,000 mg  1,000 mg Oral Bolivar Haw, Louise F, NP   1,000 mg at 06/15/21 1349   haloperidol (HALDOL) tablet 5 mg  5 mg Oral QHS Gabriel Cirri F, NP   5 mg at 06/14/21 2136   mirtazapine (REMERON) tablet 15 mg  15 mg Oral QHS Vanetta Mulders, NP   15 mg at 06/14/21 2137   OLANZapine (ZYPREXA) tablet 10 mg  10 mg Oral BID Vanetta Mulders, NP   10 mg at 06/15/21 1349   traZODone (DESYREL) tablet 100 mg  100 mg Oral QHS PRN Vanetta Mulders, NP       Current Outpatient Medications  Medication Sig Dispense Refill   atorvastatin (LIPITOR) 10 MG tablet Take 1 tablet (10 mg total) by mouth daily at 6 PM. 30 tablet 0   benztropine (COGENTIN) 1 MG tablet Take 1 tablet (1 mg total) by mouth 2 (two) times daily. 60 tablet 0   divalproex (DEPAKOTE) 500 MG DR tablet Take 2 tablets (1,000 mg total) by mouth every morning. 60 tablet 0   haloperidol (HALDOL) 5 MG tablet Take 1 tablet (5 mg total) by mouth at bedtime. 30 tablet 0   loperamide (IMODIUM) 2 MG capsule 1 capsule (Patient not taking: Reported on 06/14/2021)     mirtazapine (REMERON) 15 MG tablet Take 1 tablet (15 mg total) by mouth at bedtime. 30 tablet 0   OLANZapine (ZYPREXA) 10 MG tablet Take 1 tablet (10 mg total) by mouth 2 (two) times daily. 60 tablet 0   paliperidone (INVEGA SUSTENNA) 156 MG/ML SUSY injection Inject 1 mL (156 mg total) into the muscle once for 1 dose. (Patient not taking: Reported on 02/06/2021) 1  mL 0   paliperidone (INVEGA SUSTENNA) 234 MG/1.5ML SUSY injection Inject 234 mg into the muscle once for 1 dose. Next injection due 05/27/18 (Patient not taking: Reported on 02/06/2021) 1.5 mL 0   paliperidone (INVEGA) 6 MG 24 hr tablet Take 1 tablet (6 mg total) by mouth daily. (Patient not taking: No sig reported) 30 tablet 0   traZODone (DESYREL) 100 MG tablet Take 1 tablet (100 mg total) by mouth at bedtime as needed for sleep. (Patient not taking: Reported on 06/14/2021) 30 tablet 0   Psychiatric Specialty Exam: MENTAL STATUS EXAM:  Appearance:  AAM,  wearing hospital clothes, with decreased grooming and hygiene.   Attitude/Behavior: calm, non-cooperative, not engaging, no eye contact.  Motor: dyskinesias not evident.   Speech: refuses to talk  Mood: appears dysthymic  Affect: restricted  Thought process: unable to assess  Thought content: unable to assess  Thought perception: unable to assess.  Cognition: unable to assess.  Insight: poor  Judgement: poor  Physical Exam: Physical Exam ROS Blood pressure (!) 121/57, pulse 88, temperature 98.1 F (36.7 C), temperature source Oral, resp. rate 18, height 6' (1.829 m), weight 54.4 kg, SpO2 100 %. Body mass index is 16.27 kg/m.  Treatment Plan Summary: Daily contact with  patient to assess and evaluate symptoms and progress in treatment and Medication management  Gean [place] 156mg  IM administered yesterday Continue Olanzapine 10mg  PO BID for psychosis Continue Depakote 1000mg  PO QAM for mood stabilization Continue Haldol 5mg  PO QHS for psychosis and agitation Continue Mirtazapine 10mg  PO QHS for depression, sleep Continue Benztropin 1mg  PO BID for EPS propx   Disposition: Recommend psychiatric Inpatient admission when medically cleared.  , MD 06/15/2021 3:28 PM

## 2021-06-15 NOTE — ED Notes (Signed)
Pt refused to speak with psych team.

## 2021-06-15 NOTE — ED Notes (Signed)
Report to include Situation, Background, Assessment, and Recommendations received from Amy RN. Patient alert and oriented, warm and dry, in no acute distress. Patient denies SI, HI, AVH and pain. Patient made aware of Q15 minute rounds and security cameras for their safety. Patient instructed to come to me with needs or concerns.  

## 2021-06-15 NOTE — ED Notes (Addendum)
Pt pacing around the unit.  Calm and cooperative.

## 2021-06-15 NOTE — ED Notes (Signed)
Pt given snack. 

## 2021-06-16 MED ORDER — HALOPERIDOL 5 MG PO TABS
5.0000 mg | ORAL_TABLET | Freq: Two times a day (BID) | ORAL | Status: DC
  Administered 2021-06-16 – 2021-06-20 (×7): 5 mg via ORAL
  Filled 2021-06-16 (×11): qty 1

## 2021-06-16 NOTE — ED Notes (Signed)
Hourly rounding reveals patient in room. No complaints, stable, in no acute distress. Q15 minute rounds and monitoring via Security Cameras to continue. 

## 2021-06-16 NOTE — ED Notes (Signed)
Pt comes up to nurses' station asking for medications now. Meds given.

## 2021-06-16 NOTE — Consult Note (Signed)
[facility] Face-to-Face Psychiatry Consult   Reason for Consult:  psych follow-up Referring Physician:  Dr Roxan Hockey Patient Identification: David Davila. MRN:  [id number] Principal Diagnosis: Paranoid schizophrenia (HCC) Diagnosis:  Principal Problem:   Paranoid schizophrenia (HCC)   Total Time spent with patient: 15 minutes  David Davila. is a 40 y.o. male patient admitted with psychosis.  Client agitated today and refused to allow the tech and nurse to get vital signs.  He was upset there were "2 nurses" and left his room and started pacing around the unit.  Disorganized on assessment as he was pacing as he could not sit.  "I was just in the bed".  When asked about how he slept.  Then, switched to "I don't have a break."  Then, he walked off.  Later, he did take his medications.  The client is very unstable and continues to meet criteria for admission.     Past Medical History:  Past Medical History:  Diagnosis Date   Acute psychosis (HCC)    Antisocial personality disorder (HCC)    Cannabis abuse    Past   Paranoid schizophrenia (HCC)    Schizoaffective disorder, bipolar type (HCC)    History reviewed. No pertinent surgical history. Family History: History reviewed. No pertinent family history. Family Psychiatric  History: n/a Social History:  Social History   Substance and Sexual Activity  Alcohol Use No     Social History   Substance and Sexual Activity  Drug Use Not Currently    Social History   Socioeconomic History   Marital status: Single    Spouse name: Not on file   Number of children: Not on file   Years of education: Not on file   Highest education level: Not on file  Occupational History   Not on file  Tobacco Use   Smoking status: Every Day    Packs/day: 1.00    Types: Cigarettes   Smokeless tobacco: Never  Substance and Sexual Activity   Alcohol use: No   Drug use: Not Currently   Sexual activity: Not Currently  Other  Topics Concern   Not on file  Social History Narrative   Not on file   Social Determinants of Health   Financial Resource Strain: Not on file  Food Insecurity: Not on file  Transportation Needs: Not on file  Physical Activity: Not on file  Stress: Not on file  Social Connections: Not on file   Additional Social History:    Allergies:   Allergies  Allergen Reactions   Invega Sustenna [Paliperidone Palmitate Er] Other (See Comments)    Caused drop in WBC and neutropenia following injection in August 2019 (he was not on other medications that could have caused this drop). Has tolerated it in the past (with no drop in WBC). Repeat CBC a few days later in August then showed improved ANC and WBC. Would recommend monitoring CBC while on this medication   Zyprexa [Olanzapine] Other (See Comments)    Reaction: unknown    Labs:  Results for orders placed or performed during the hospital encounter of 06/14/21 (from the past 48 hour(s))  Comprehensive metabolic panel     Status: Abnormal   Collection Time: 06/14/21  1:33 PM  Result Value Ref Range   Sodium 138 135 - 145 mmol/L   Potassium 4.0 3.5 - 5.1 mmol/L   Chloride 101 98 - 111 mmol/L   CO2 30 22 - 32 mmol/L   Glucose, Bld 108 (  H) 70 - 99 mg/dL    Comment: Glucose reference range applies only to samples taken after fasting for at least 8 hours.   BUN 12 6 - 20 mg/dL   Creatinine, Ser 7.42 0.61 - 1.24 mg/dL   Calcium 9.7 8.9 - 59.5 mg/dL   Total Protein 7.9 6.5 - 8.1 g/dL   Albumin 4.2 3.5 - 5.0 g/dL   AST 24 15 - 41 U/L   ALT 13 0 - 44 U/L   Alkaline Phosphatase 68 38 - 126 U/L   Total Bilirubin 2.0 (H) 0.3 - 1.2 mg/dL   GFR, Estimated >63 >87 mL/min    Comment: (NOTE) Calculated using the CKD-EPI Creatinine Equation (2021)    Anion gap 7 5 - 15    Comment: Performed at [facility], [address] [place], [place] [address]  Ethanol     Status: None   Collection Time: 06/14/21  1:33 PM  Result Value Ref  Range   Alcohol, Ethyl (B) <10 <10 mg/dL    Comment: (NOTE) Lowest detectable limit for serum alcohol is 10 mg/dL.  For medical purposes only. Performed at [facility], [address] [place], [place] [address]   Salicylate level     Status: Abnormal   Collection Time: 06/14/21  1:33 PM  Result Value Ref Range   Salicylate Lvl <7.0 (L) 7.0 - 30.0 mg/dL    Comment: Performed at [facility], [address] [place], [place] [address]  Acetaminophen level     Status: Abnormal   Collection Time: 06/14/21  1:33 PM  Result Value Ref Range   Acetaminophen (Tylenol), Serum <10 (L) 10 - 30 ug/mL    Comment: (NOTE) Therapeutic concentrations vary significantly. A range of 10-30 ug/mL  may be an effective concentration for many patients. However, some  are best treated at concentrations outside of this range. Acetaminophen concentrations >150 ug/mL at 4 hours after ingestion  and >50 ug/mL at 12 hours after ingestion are often associated with  toxic reactions.  Performed at [facility], [address] [place], [place] [address]   cbc     Status: None   Collection Time: 06/14/21  1:33 PM  Result Value Ref Range   WBC 4.0 4.0 - 10.5 K/uL   RBC 4.67 4.22 - 5.81 MIL/uL   Hemoglobin 14.3 13.0 - 17.0 g/dL   HCT 60.1 09.3 - 23.5 %   MCV 91.0 80.0 - 100.0 fL   MCH 30.6 26.0 - 34.0 pg   MCHC 33.6 30.0 - 36.0 g/dL   RDW 57.3 22.0 - 25.4 %   Platelets 183 150 - 400 K/uL   nRBC 0.0 0.0 - 0.2 %    Comment: Performed at [facility], [address], [place], [place] [address]  Resp Panel by RT-PCR (Flu A&B, Covid) Nasopharyngeal Swab     Status: None   Collection Time: 06/14/21  2:10 PM   Specimen: Nasopharyngeal Swab; Nasopharyngeal(NP) swabs in vial transport medium  Result Value Ref Range   SARS Coronavirus 2 by RT PCR NEGATIVE NEGATIVE    Comment: (NOTE) SARS-CoV-2 target nucleic acids are NOT DETECTED.  The SARS-CoV-2 RNA is generally  detectable in upper respiratory specimens during the acute phase of infection. The lowest concentration of SARS-CoV-2 viral copies this assay can detect is 138 copies/mL. A negative result does not preclude SARS-Cov-2 infection and should not be used as the sole basis for treatment or other patient management decisions. A negative result  may occur with  improper specimen collection/handling, submission of specimen other than nasopharyngeal swab, presence of viral mutation(s) within the areas targeted by this assay, and inadequate number of viral copies(<138 copies/mL). A negative result must be combined with clinical observations, patient history, and epidemiological information. The expected result is Negative.  Fact Sheet for Patients:  BloggerCourse.com  Fact Sheet for Healthcare Providers:  SeriousBroker.it  This test is no t yet approved or cleared by the [place] FDA and  has been authorized for detection and/or diagnosis of SARS-CoV-2 by FDA under an Emergency Use Authorization (EUA). This EUA will remain  in effect (meaning this test can be used) for the duration of the COVID-19 declaration under Section 564(b)(1) of the Act, 21 U.S.C.section 360bbb-3(b)(1), unless the authorization is terminated  or revoked sooner.       Influenza A by PCR NEGATIVE NEGATIVE   Influenza B by PCR NEGATIVE NEGATIVE    Comment: (NOTE) The Xpert Xpress SARS-CoV-2/FLU/RSV plus assay is intended as an aid in the diagnosis of influenza from Nasopharyngeal swab specimens and should not be used as a sole basis for treatment. Nasal washings and aspirates are unacceptable for Xpert Xpress SARS-CoV-2/FLU/RSV testing.  Fact Sheet for Patients: BloggerCourse.com  Fact Sheet for Healthcare Providers: SeriousBroker.it  This test is not yet approved or cleared by the [place] FDA and has  been authorized for detection and/or diagnosis of SARS-CoV-2 by FDA under an Emergency Use Authorization (EUA). This EUA will remain in effect (meaning this test can be used) for the duration of the COVID-19 declaration under Section 564(b)(1) of the Act, 21 U.S.C. section 360bbb-3(b)(1), unless the authorization is terminated or revoked.  Performed at [facility], [address] [place], [place] [address]   Urine Drug Screen, Qualitative     Status: None   Collection Time: 06/14/21 10:12 PM  Result Value Ref Range   Tricyclic, Ur Screen NONE DETECTED NONE DETECTED   Amphetamines, Ur Screen NONE DETECTED NONE DETECTED   MDMA (Ecstasy)Ur Screen NONE DETECTED NONE DETECTED   Cocaine Metabolite,Ur [place] NONE DETECTED NONE DETECTED   Opiate, Ur Screen NONE DETECTED NONE DETECTED   Phencyclidine (PCP) Ur S NONE DETECTED NONE DETECTED   Cannabinoid 50 Ng, Ur [place] NONE DETECTED NONE DETECTED   Barbiturates, Ur Screen NONE DETECTED NONE DETECTED   Benzodiazepine, Ur Scrn NONE DETECTED NONE DETECTED   Methadone Scn, Ur NONE DETECTED NONE DETECTED    Comment: (NOTE) Tricyclics + metabolites, urine    Cutoff 1000 ng/mL Amphetamines + metabolites, urine  Cutoff 1000 ng/mL MDMA (Ecstasy), urine              Cutoff 500 ng/mL Cocaine Metabolite, urine          Cutoff 300 ng/mL Opiate + metabolites, urine        Cutoff 300 ng/mL Phencyclidine (PCP), urine         Cutoff 25 ng/mL Cannabinoid, urine                 Cutoff 50 ng/mL Barbiturates + metabolites, urine  Cutoff 200 ng/mL Benzodiazepine, urine              Cutoff 200 ng/mL Methadone, urine                   Cutoff 300 ng/mL  The urine drug screen provides only a preliminary, unconfirmed analytical test result and should not be used for non-medical purposes. Clinical consideration and professional judgment should be  applied to any positive drug screen result due to possible interfering substances. A more specific alternate  chemical method must be used in order to obtain a confirmed analytical result. Gas chromatography / mass spectrometry (GC/MS) is the preferred confirm atory method. Performed at [facility], [address] [place], [place] [address]     Current Facility-Administered Medications  Medication Dose Route Frequency Provider Last Rate Last Admin   atorvastatin (LIPITOR) tablet 10 mg  10 mg Oral q1800 Vanetta Mulders, NP   10 mg at 06/15/21 1850   benztropine (COGENTIN) tablet 1 mg  1 mg Oral BID Gabriel Cirri F, NP   1 mg at 06/16/21 1025   divalproex (DEPAKOTE) DR tablet 1,000 mg  1,000 mg Oral Bolivar Haw, Louise F, NP   1,000 mg at 06/16/21 1025   haloperidol (HALDOL) tablet 5 mg  5 mg Oral BID Charm Rings, NP   5 mg at 06/16/21 1025   mirtazapine (REMERON) tablet 15 mg  15 mg Oral QHS Vanetta Mulders, NP   15 mg at 06/14/21 2137   OLANZapine (ZYPREXA) tablet 10 mg  10 mg Oral BID Vanetta Mulders, NP   10 mg at 06/16/21 1026   traZODone (DESYREL) tablet 100 mg  100 mg Oral QHS PRN Vanetta Mulders, NP       Current Outpatient Medications  Medication Sig Dispense Refill   atorvastatin (LIPITOR) 10 MG tablet Take 1 tablet (10 mg total) by mouth daily at 6 PM. 30 tablet 0   benztropine (COGENTIN) 1 MG tablet Take 1 tablet (1 mg total) by mouth 2 (two) times daily. 60 tablet 0   divalproex (DEPAKOTE) 500 MG DR tablet Take 2 tablets (1,000 mg total) by mouth every morning. 60 tablet 0   haloperidol (HALDOL) 5 MG tablet Take 1 tablet (5 mg total) by mouth at bedtime. 30 tablet 0   loperamide (IMODIUM) 2 MG capsule 1 capsule (Patient not taking: Reported on 06/14/2021)     mirtazapine (REMERON) 15 MG tablet Take 1 tablet (15 mg total) by mouth at bedtime. 30 tablet 0   OLANZapine (ZYPREXA) 10 MG tablet Take 1 tablet (10 mg total) by mouth 2 (two) times daily. 60 tablet 0   paliperidone (INVEGA SUSTENNA) 156 MG/ML SUSY injection Inject 1 mL (156 mg total) into the  muscle once for 1 dose. (Patient not taking: Reported on 02/06/2021) 1 mL 0   paliperidone (INVEGA SUSTENNA) 234 MG/1.5ML SUSY injection Inject 234 mg into the muscle once for 1 dose. Next injection due 05/27/18 (Patient not taking: Reported on 02/06/2021) 1.5 mL 0   paliperidone (INVEGA) 6 MG 24 hr tablet Take 1 tablet (6 mg total) by mouth daily. (Patient not taking: No sig reported) 30 tablet 0   traZODone (DESYREL) 100 MG tablet Take 1 tablet (100 mg total) by mouth at bedtime as needed for sleep. (Patient not taking: Reported on 06/14/2021) 30 tablet 0   Psychiatric Specialty Exam: Physical Exam Vitals and nursing note reviewed.  Constitutional:      Appearance: Normal appearance.  HENT:     Head: Normocephalic.     Nose: Nose normal.  Pulmonary:     Effort: Pulmonary effort is normal.  Musculoskeletal:        General: Normal range of motion.     Cervical back: Normal range of motion.  Neurological:     General: No focal deficit present.     Mental Status: He is alert.  Psychiatric:  Attention and Perception: He is inattentive. He perceives auditory hallucinations.        Mood and Affect: Mood is anxious. Affect is blunt.        Speech: Speech normal.        Behavior: Behavior is agitated and aggressive.        Thought Content: Thought content is paranoid and delusional.        Cognition and Memory: Cognition is impaired.        Judgment: Judgment is impulsive and inappropriate.    Review of Systems  Psychiatric/Behavioral:  Positive for depression, hallucinations and substance abuse. The patient is nervous/anxious.   All other systems reviewed and are negative.  Blood pressure (!) 121/57, pulse 88, temperature 98.1 F (36.7 C), temperature source Oral, resp. rate 18, height 6' (1.829 m), weight 54.4 kg, SpO2 100 %.Body mass index is 16.27 kg/m.  General Appearance: Disheveled  Eye Contact:  Minimal  Speech:  Normal Rate  Volume:  Normal  Mood:  Anxious and Irritable   Affect:  Blunt  Thought Process:  Disorganized and Descriptions of Associations: Loose  Orientation:  Other:  person  Thought Content:  Delusions, Hallucinations: Auditory, and Paranoid Ideation  Suicidal Thoughts:  No  Homicidal Thoughts:  No  Memory:  Immediate;   Poor Recent;   Poor Remote;   Poor  Judgement:  Impaired  Insight:  Lacking  Psychomotor Activity:  Increased and Restlessness  Concentration:  Concentration: Poor and Attention Span: Poor  Recall:  Poor  Fund of Knowledge:   unable to assess  Language:  Fair  Akathisia:  No  Handed:  Right  AIMS (if indicated):     Assets:  Leisure Time Physical Health Resilience  ADL's:  Intact  Cognition:  Impaired,  Moderate  Sleep:        Physical Exam: Physical Exam Vitals and nursing note reviewed.  Constitutional:      Appearance: Normal appearance.  HENT:     Head: Normocephalic.     Nose: Nose normal.  Pulmonary:     Effort: Pulmonary effort is normal.  Musculoskeletal:        General: Normal range of motion.     Cervical back: Normal range of motion.  Neurological:     General: No focal deficit present.     Mental Status: He is alert.  Psychiatric:        Attention and Perception: He is inattentive. He perceives auditory hallucinations.        Mood and Affect: Mood is anxious. Affect is blunt.        Speech: Speech normal.        Behavior: Behavior is agitated and aggressive.        Thought Content: Thought content is paranoid and delusional.        Cognition and Memory: Cognition is impaired.        Judgment: Judgment is impulsive and inappropriate.   Review of Systems  Psychiatric/Behavioral:  Positive for depression, hallucinations and substance abuse. The patient is nervous/anxious.   All other systems reviewed and are negative. Blood pressure (!) 121/57, pulse 88, temperature 98.1 F (36.7 C), temperature source Oral, resp. rate 18, height 6' (1.829 m), weight 54.4 kg, SpO2 100 %. Body mass index  is 16.27 kg/m.  Treatment Plan Summary: Daily contact with patient to assess and evaluate symptoms and progress in treatment and Medication management:  Schizophrenia, paranoid type: Hinda Glatter Sustenna  IM administered yesterday Continue Olanzapine  PO BID  for psychosis Continue Depakote 1000mg  PO QAM for mood stabilization Continue Haldol 5mg  PO QHS for psychosis and agitation increased BID  Insomnia: Continue Mirtazapine 15 mg PO QHS for depression, sleep Continue Trazodone 100 mg qhs PRN  EPS: Continue Benztropin 1mg  PO BID for EPS propx  Disposition: Recommend psychiatric Inpatient admission when medically cleared.  Nanine Means, NP 06/16/2021 11:38 AM

## 2021-06-16 NOTE — ED Notes (Signed)
Patient came out of his room pacing in the dayroom. Staff redirected patient back to his room. No issues.

## 2021-06-16 NOTE — ED Notes (Signed)
Tech went by pt door and explained to him that in the hospital vital signs need to be taken at least once a day. Pt again, began talking about needing a Clinical research associate . Tech explained that as long as he is not willing to be cooperative then his stay will most likely be longer. PT again talking about the need to get a lawyer first. Tech told him that's not something he can do while here at the hospital and pt said "yes it is" Tech tried once more to ask if he would let her get vitals and pt began humming through the tech talking as if ignoring her. Tech also told pt that he needed to walk his trash up to the door to put in trash can versus putting it on a chair in the day room. He has done this several times today.

## 2021-06-16 NOTE — ED Notes (Signed)
Staff approached pt room with dinner tray. Pt came to door and said he wanted sprite when asked. Tech asked pt if she could get vital signs and told him he could stand right there. Pt said yes and placed his dinner tray and drink on the floor and as tech went to put the blood pressure cuff on pt he said "no no, I am not doing this until I talk to my lawyer" Staff asked pt what his lawyer had to do with his vitals and explained that we needed at least one set of vitals every day while he is in the hospital. Pt continues to say "I need to speak with my lawyer" and then backs into room and shuts the doors.

## 2021-06-16 NOTE — ED Notes (Signed)
Pt refusing meds and vitals. Started to let tech take BP, then ripped it off. States that there can only be one nurse when this RN comes into room. Starts getting mad when this RN attempts to talk to pt. Attempted to let officer hand him meds since this RN has upset pt. When handed meds, pt states same thing back to officer "here are your meds and here is your water" and handed meds back to officer.

## 2021-06-16 NOTE — BH Assessment (Signed)
This Clinical research associate contacted [facility] [phone]) and spoke with Molly Maduro who reports that referral is currently with the [employer] and to contact the [employer] and ask for [place]. Department is only available Monday-Friday 8am-5pm. Contact the main line and ask to be transferred to [facility]. Molly Maduro did mention that patient will have to have a Forensics Evaluation completed by the facility to move forward with the process.   TTS to follow up on Monday

## 2021-06-16 NOTE — ED Notes (Signed)
Pt started with vitals and then seemed uneasy and paranoid about the pulse ox and would not put it on his finger.  Pt then started talking in incomplete thoughts Pt told us that he didn't need two RNs in his room. Pt became very aggravated and pulled cuff off and said no.  Pt also denied meds this morning  Staff left the room

## 2021-06-16 NOTE — ED Notes (Signed)
Psych NP at bedside with officer 

## 2021-06-16 NOTE — ED Notes (Signed)
VS not taken, patient asleep 

## 2021-06-16 NOTE — ED Notes (Signed)
Pt stated he would like to take a shower after the pt going before him. Staff got supplies ready, cleaned shower, and went and told pt that the shower room was ready. Pt got up from bed and walked to shower door. Staff explained all the supplies she had put out for him. He hesitantly said, "I dont want to take a shower, I took one yesterday"  Staff told him that was fine and pt went back and laid down in bed.

## 2021-06-16 NOTE — ED Provider Notes (Signed)
Emergency Medicine Observation Re-evaluation Note  David Carino. is a 40 y.o. male, seen on rounds today.  Pt initially presented to the ED for complaints of Psychiatric Evaluation .  Physical Exam  BP (!) 121/57 (BP Location: Right Arm)   Pulse 88   Temp 98.1 F (36.7 C) (Oral)   Resp 18   Ht 6' (1.829 m)   Wt 54.4 kg   SpO2 100%   BMI 16.27 kg/m  Physical Exam General: no acute distress Psych: calm  ED Course / MDM  EKG:   I have reviewed the labs performed to date as well as medications administered while in observation.  Recent changes in the last 24 hours include none.  Plan  Current plan is for inpatient. David Muller. is under involuntary commitment.      Gilles Chiquito, MD 06/16/21 1450

## 2021-06-17 NOTE — ED Provider Notes (Signed)
Emergency Medicine Observation Re-evaluation Note  David Davila. is a 40 y.o. male, seen on rounds today.  Pt initially presented to the ED for complaints of Psychiatric Evaluation Currently, the patient is sitting upright, calm not in distress.   Physical Exam  BP (!) 121/57 (BP Location: Right Arm)   Pulse 88   Temp 98.1 F (36.7 C) (Oral)   Resp 18   Ht 6' (1.829 m)   Wt 54.4 kg   SpO2 100%   BMI 16.27 kg/m  Physical Exam General: no distress  Lungs: no resp distress  Psych: staring forward, no agitation   ED Course / MDM  EKG:   I have reviewed the labs performed to date as well as medications administered while in observation.  Recent changes in the last 24 hours include pt refusing to have vital signs and refused depakote this AM.   Plan  Current plan is for psych admission. Attempting to persuade pt to take Depakote now.  [name] is under involuntary commitment.      Georga Hacking, MD 06/17/21 [phone]

## 2021-06-17 NOTE — ED Notes (Signed)
Pt pacing in the hallway, attempted to offer morning medications again. Pt dumped them in hand, and gave them back to this RN. Pt states, "these aren't mine." Pt refused medication at this point.

## 2021-06-17 NOTE — ED Notes (Signed)
Meal tray given. Patient was pleasant, smiling, came to the door to get his orange juice.

## 2021-06-17 NOTE — ED Notes (Signed)
Pt. Was given breakfast tray, also this tech attempted to collect AM vitals, pt stated "not now".

## 2021-06-17 NOTE — ED Notes (Signed)
Patient came to common room and requested a Sprite which was given. Patient finished drink and requested the TV in his room to be changed to a different channel. David Davila changed the channel.

## 2021-06-17 NOTE — ED Notes (Signed)
This RN at bedside. Pt attempting to give morning medication. Pt states, "what is this birth control medicine" reassured pt that this was his home medication. Pt still refusing at this time. Will attempt again.

## 2021-06-17 NOTE — ED Notes (Signed)
Hourly rounding reveals patient in room. No complaints, stable, in no acute distress. Q15 minute rounds and monitoring via Security Cameras to continue. 

## 2021-06-17 NOTE — ED Notes (Signed)
Pt refused VS after Clinical research associate and RN made advances to take VS

## 2021-06-17 NOTE — ED Notes (Signed)
Pt. Got dinner tray.

## 2021-06-17 NOTE — ED Notes (Signed)
Pt. Alert and oriented, warm and dry, in no distress. Pt. Denies SI, HI, and AVH. Patient appears to be paranoid with Clinical research associate. Looking around and not at [employer]. Pt. Encouraged to let nursing staff know of any concerns or needs. Marland KitchenENVIRONMENTAL ASSESSMENT Potentially harmful objects out of patient reach: Yes.   Personal belongings secured: Yes.   Patient dressed in hospital provided attire only: Yes.   Plastic bags out of patient reach: Yes.   Patient care equipment (cords, cables, call bells, lines, and drains) shortened, removed, or accounted for: Yes.   Equipment and supplies removed from bottom of stretcher: Yes.   Potentially toxic materials out of patient reach: Yes.   Sharps container removed or out of patient reach: Yes.

## 2021-06-17 NOTE — ED Notes (Signed)
Pt received snack and drink 

## 2021-06-17 NOTE — ED Notes (Signed)
IVC pending placement 

## 2021-06-17 NOTE — ED Notes (Signed)
This tech tried to get pt. AM vitals but refused.

## 2021-06-17 NOTE — ED Notes (Signed)
Patient refusing vitals

## 2021-06-17 NOTE — ED Notes (Signed)
Patient came out to the common area, but refused to talk with this Clinical research associate and walked back to his room.

## 2021-06-17 NOTE — ED Notes (Signed)
Pt. Currently has shower supplies, pt. Is in the shower.

## 2021-06-17 NOTE — ED Notes (Signed)
Report to include Situation, Background, Assessment, and Recommendations received from [facility]. Patient alert and oriented, warm and dry, in no acute distress. UTA SI, HI, AVH and pain due to patient refusing to talk. Patient made aware of Q15 minute rounds and security cameras for their safety. Patient instructed to come to me with needs or concerns.

## 2021-06-17 NOTE — ED Notes (Signed)
Pt. Returned all shower supplies, pt. Is done showering.

## 2021-06-17 NOTE — ED Notes (Signed)
Report given to Hewan RN 

## 2021-06-18 NOTE — ED Notes (Signed)
Hourly rounding reveals patient in room. No complaints, stable, in no acute distress. Q15 minute rounds and monitoring via Security Cameras to continue. 

## 2021-06-18 NOTE — ED Notes (Signed)
Pt came out of room to throw trash away in bag outside of nurse's station. Pt immediately returned to his room and laid down.  Will attempt to give meds later this morning.

## 2021-06-18 NOTE — ED Notes (Signed)
VS not taken, patient asleep 

## 2021-06-18 NOTE — ED Notes (Signed)
IVC inpatient recommended

## 2021-06-18 NOTE — ED Notes (Signed)
Patient refused vsigns 

## 2021-06-18 NOTE — ED Notes (Signed)
Meal tray given 

## 2021-06-18 NOTE — ED Notes (Signed)
Report received from Amy, English as a second language teacher. Patient alert and oriented, warm and dry, in no acute distress. Patient does not answer questions on SI, HI, AVH and pain. Patient made aware of Q15 minute rounds and security cameras for their safety. Patient instructed to come to this nurse with needs or concerns.

## 2021-06-18 NOTE — ED Notes (Signed)
Pt would not take any medications this shift.  Pt stated there were no medications he needed.  RN attempted 3x's with no success.  Pt has remained calm.

## 2021-06-18 NOTE — ED Notes (Signed)
Unable to get pt to take meds at this time. Pt appears to be responding to internal stimuli.

## 2021-06-19 NOTE — ED Notes (Signed)
IVC/  PENDING  PLACEMENT 

## 2021-06-19 NOTE — ED Provider Notes (Signed)
Emergency Medicine Observation Re-evaluation Note  David Davila. is a 40 y.o. male, seen on rounds today.  Pt initially presented to the ED for complaints of Psychiatric Evaluation  Currently, the patient is calm, no acute complaints.  Physical Exam  Blood pressure 118/84, pulse 98, temperature 98.2 F (36.8 C), temperature source Oral, resp. rate 18, height 6' (1.829 m), weight 54.4 kg, SpO2 98 %. Physical Exam General: NAD Lungs: CTAB Psych: not agitated  ED Course / MDM  EKG:    I have reviewed the labs performed to date as well as medications administered while in observation.  Recent changes in the last 24 hours include no acute events overnight.    Plan  Current plan is for psych admission. Patient is under full IVC at this time.   Sharman Cheek, MD 06/19/21 1432

## 2021-06-19 NOTE — ED Notes (Signed)
Snack was given. 

## 2021-06-19 NOTE — ED Notes (Signed)
Patient refusing to participate in psych assessment. Will continue to monitor.

## 2021-06-19 NOTE — ED Notes (Signed)
RN handed pt his breakfast tray.  Pt asking if it was a vegetarian tray.  RN replied she could change his diet order and get one for him.  Pt then asked multiple questions about what came on the tray.  Pt then asked about a vegan tray.   Pt did take tray and head to his room.  Pt observed eating on camera.

## 2021-06-20 NOTE — ED Provider Notes (Signed)
Emergency Medicine Observation Re-evaluation Note  David Davila. is a 39 y.o. male, seen in the emergency department for psychiatric evaluation No acute events overnight.  Patient has no acute complaints.  Physical Exam  BP 102/71 (BP Location: Left Arm)   Pulse 95   Temp 98.4 F (36.9 C) (Oral)   Resp 18   Ht 6' (1.829 m)   Wt 54.4 kg   SpO2 96%   BMI 16.27 kg/m    ED Course / MDM  No new lab work for review.   Plan  Current plan is for inpatient placement. David Davila. is under involuntary commitment.      Minna Antis, MD 06/20/21 1530

## 2021-06-20 NOTE — ED Notes (Addendum)
Pt's guardian David Davila called for an update. Informed pt has been referred out to [facility] and currently awaiting response at this time. Guardian attempted to speak with pt on phone but pt refused to speak with him stating "I don't have a guardian".

## 2021-06-20 NOTE — ED Notes (Signed)
IVC/Pending further evaluation

## 2021-06-20 NOTE — BH Assessment (Signed)
Writer spoke with [facility] in [place] [phone]. She stated that they have received patient's paperwork and is awaiting a date to complete patient evaluation either via video or in person. For hospitalization(inpatient) that requires another form. Julieanne Cotton suggested to fax IVC paperwork to [phone] for bed availability.

## 2021-06-20 NOTE — ED Notes (Signed)
Pt refused vitals by this Clinical research associate.

## 2021-06-20 NOTE — ED Notes (Signed)
Up to bathroom

## 2021-06-20 NOTE — ED Notes (Signed)
IVC/  PENDING  PLACEMENT 

## 2021-06-21 NOTE — ED Notes (Signed)
Pt given dinner tray.

## 2021-06-21 NOTE — ED Notes (Signed)
Report received from [facility]. Patient care assumed. Patient/RN introduction complete. Will continue to monitor. Pt standing in room watching TV. Pt asking about reason for being in ED and why he is being forced meds. Pt reoriented to situation, calm and cooperative at present.

## 2021-06-21 NOTE — BH Assessment (Signed)
Writer followed up with [facility] Koleen Nimrod [phone]), they have his information but he is on "the [place] List." When he is stable and discharged from [facility], he is going back to jail. The jail and [facility] will be in communication about him going. Writer asked if their was anything else they needed and she said no.

## 2021-06-21 NOTE — ED Notes (Signed)
Pt given lunch tray.

## 2021-06-21 NOTE — ED Notes (Signed)
IVC renewed by Dr. Toni Amend

## 2021-06-21 NOTE — ED Notes (Signed)
Pt very anxious and angry because he has no idea what time of day it is.  States "How am I supposed to know if its day or night?  I havent seen the sun in a long time.  How am I supposed to know".  Pt agitated.

## 2021-06-21 NOTE — ED Notes (Signed)
Pt provided with snack. Pt appears restless but cooperative.

## 2021-06-21 NOTE — ED Notes (Signed)
Pt given breakfast tray

## 2021-06-21 NOTE — ED Provider Notes (Signed)
Emergency Medicine Observation Re-evaluation Note  David Davila. is a 40 y.o. male, seen in the emergency department for psychiatric evaluation. No acute events overnight.  Physical Exam  BP 105/64 (BP Location: Left Arm)   Pulse 77   Temp 98.3 F (36.8 C) (Oral)   Resp 16   Ht 6' (1.829 m)   Wt 54.4 kg   SpO2 98%   BMI 16.27 kg/m    ED Course / MDM   No new lab work for review  Plan  Current plan is for inpatient placement. David Davila. is under involuntary commitment.      Minna Antis, MD 06/21/21 1348

## 2021-06-22 ENCOUNTER — Inpatient Hospital Stay
Admission: AD | Admit: 2021-06-22 | Discharge: 2021-07-25 | DRG: 885 | Payer: 59 | Source: Intra-hospital | Attending: Psychiatry | Admitting: Psychiatry

## 2021-06-22 DIAGNOSIS — Z91148 Patient's other noncompliance with medication regimen for other reason: Secondary | ICD-10-CM

## 2021-06-22 DIAGNOSIS — Z9114 Patient's other noncompliance with medication regimen: Secondary | ICD-10-CM | POA: Diagnosis not present

## 2021-06-22 DIAGNOSIS — S069XAA Unspecified intracranial injury with loss of consciousness status unknown, initial encounter: Secondary | ICD-10-CM | POA: Diagnosis present

## 2021-06-22 DIAGNOSIS — F200 Paranoid schizophrenia: Principal | ICD-10-CM | POA: Diagnosis present

## 2021-06-22 DIAGNOSIS — F940 Selective mutism: Secondary | ICD-10-CM | POA: Diagnosis present

## 2021-06-22 DIAGNOSIS — F17210 Nicotine dependence, cigarettes, uncomplicated: Secondary | ICD-10-CM | POA: Diagnosis present

## 2021-06-22 DIAGNOSIS — Z87820 Personal history of traumatic brain injury: Secondary | ICD-10-CM | POA: Diagnosis not present

## 2021-06-22 MED ORDER — OLANZAPINE 10 MG PO TABS
10.0000 mg | ORAL_TABLET | Freq: Two times a day (BID) | ORAL | Status: DC
  Administered 2021-06-22 – 2021-07-04 (×18): 10 mg via ORAL
  Filled 2021-06-22 (×22): qty 1

## 2021-06-22 MED ORDER — MIRTAZAPINE 15 MG PO TABS
15.0000 mg | ORAL_TABLET | Freq: Every day | ORAL | Status: DC
  Administered 2021-06-22 – 2021-07-24 (×24): 15 mg via ORAL
  Filled 2021-06-22 (×27): qty 1

## 2021-06-22 MED ORDER — HALOPERIDOL 5 MG PO TABS
7.5000 mg | ORAL_TABLET | Freq: Two times a day (BID) | ORAL | Status: DC
  Administered 2021-06-22: 7.5 mg via ORAL
  Filled 2021-06-22: qty 1

## 2021-06-22 MED ORDER — ACETAMINOPHEN 325 MG PO TABS: 650.0000 mg | ORAL_TABLET | Freq: Four times a day (QID) | ORAL | Status: DC | PRN

## 2021-06-22 MED ORDER — BENZTROPINE MESYLATE 1 MG PO TABS
1.0000 mg | ORAL_TABLET | Freq: Two times a day (BID) | ORAL | Status: DC
  Administered 2021-06-22 – 2021-07-25 (×52): 1 mg via ORAL
  Filled 2021-06-22 (×61): qty 1

## 2021-06-22 MED ORDER — TRAZODONE HCL 100 MG PO TABS
100.0000 mg | ORAL_TABLET | Freq: Every evening | ORAL | Status: DC | PRN
  Administered 2021-06-26 – 2021-07-24 (×13): 100 mg via ORAL
  Filled 2021-06-22 (×15): qty 1

## 2021-06-22 MED ORDER — ATORVASTATIN CALCIUM 20 MG PO TABS: 10.0000 mg | ORAL_TABLET | Freq: Every day | ORAL | Status: DC

## 2021-06-22 MED ORDER — ALUM & MAG HYDROXIDE-SIMETH 200-200-20 MG/5ML PO SUSP: 30.0000 mL | ORAL | Status: DC | PRN

## 2021-06-22 MED ORDER — MAGNESIUM HYDROXIDE 400 MG/5ML PO SUSP: 30.0000 mL | Freq: Every day | ORAL | Status: DC | PRN

## 2021-06-22 MED ORDER — HALOPERIDOL 5 MG PO TABS
7.5000 mg | ORAL_TABLET | Freq: Two times a day (BID) | ORAL | Status: DC
  Administered 2021-06-22 – 2021-06-27 (×8): 7.5 mg via ORAL
  Filled 2021-06-22 (×8): qty 2

## 2021-06-22 MED ORDER — DIVALPROEX SODIUM 500 MG PO DR TAB
1000.0000 mg | DELAYED_RELEASE_TABLET | ORAL | Status: DC
  Administered 2021-06-23 – 2021-07-25 (×28): 1000 mg via ORAL
  Filled 2021-06-22 (×32): qty 2

## 2021-06-22 NOTE — ED Notes (Addendum)
Pt transferring to BMU Rm 323. Pt informed of admission and he is agreeable to it. Report given to [place], [place]

## 2021-06-22 NOTE — ED Notes (Signed)
Report to [employer], rn.

## 2021-06-22 NOTE — ED Notes (Signed)
IVC, pend BMU placement

## 2021-06-22 NOTE — ED Notes (Signed)
IVC/Pending Placement 

## 2021-06-22 NOTE — BH Assessment (Signed)
Patient is to be admitted to [facility] by Psychiatric Nurse Practitioner Nanine Means.  Attending Physician will be Dr.  Toni Amend .   Patient has been assigned to room 325, by [facility] Charge Nurse Lovie Chol staff is aware of the admission: Nitchia, ER Secretary   Dr. Elige Radon, ER MD  Caren Griffins., Patient's Nurse  Sue Lush, Patient Access.

## 2021-06-22 NOTE — ED Notes (Signed)
Pt given dinner tray & OJ.

## 2021-06-22 NOTE — ED Notes (Signed)
Phyllis RN called back and said the admission has to wait until 1930.

## 2021-06-23 ENCOUNTER — Other Ambulatory Visit: Payer: Self-pay

## 2021-06-23 ENCOUNTER — Encounter: Payer: Self-pay | Admitting: Psychiatry

## 2021-06-23 MED ORDER — ATORVASTATIN CALCIUM 20 MG PO TABS
10.0000 mg | ORAL_TABLET | Freq: Every day | ORAL | Status: DC
  Administered 2021-06-25 – 2021-07-24 (×21): 10 mg via ORAL
  Filled 2021-06-23 (×25): qty 1

## 2021-06-23 NOTE — H&P (Signed)
Psychiatric Admission Assessment Adult  Patient Identification: David Davila. MRN:  [id number] Date of Evaluation:  06/23/2021 Chief Complaint:  Psychosis  Schizophrenia, paranoid type (HCC) [F20.0] Principal Diagnosis: Schizophrenia Diagnosis:  Active Problems:   Schizophrenia, paranoid type (HCC)  History of Present Illness: Patient presented on 06/14/2021,  to the ED under IVC from jail via the local sheriff's department.  He has been in custody in jail for 3 to 4 weeks in a private cell.  I reviewed his IVC paperwork, which includes the following: He reportedly had a hearing in front of a judge yesterday and was quite disruptive.  He was reportedly found "covered in blood" this morning and therefore deemed a danger to himself or others, placed under IVC and brought to the ER for evaluation.   In the ED, patient was quite reticent and did not provide much information.  He denied any suicidality or AV hallucinations.  Thinks the year is 2025 and he refuses to open his mouth because we might try to feed him. Says his name is David Davila and that he "woke up with this name."  Reports he lives with a guy named Julio. He remained in ER for over 7 days and was eventually transferred to the [facility]. He has been followed by psychiatry in the ER and has been started on medicines. He has been laying in his bed this morning, took his meds with some prompting and initially did talk to me for a bit. He seems disorganized and displaying tangential thoughts. He is paranoid, not keeping an eye contact and refused to answer further inquires after a few minutes.   Associated Signs/Symptoms: Depression Symptoms:  anxiety, Duration of Depression Symptoms: No data recorded (Hypo) Manic Symptoms:  Delusions, Hallucinations, Irritable Mood, Anxiety Symptoms:  Excessive Worry, Psychotic Symptoms:  Delusions, Hallucinations: Auditory Paranoia, PTSD Symptoms: Negative Total Time spent with patient: 45  minutes  Past Psychiatric History: Schizophrenia ? Schizoaffective  Is the patient at risk to self? Yes.    Has the patient been a risk to self in the past 6 months? Yes.    Has the patient been a risk to self within the distant past? Yes.    Is the patient a risk to others? Yes.    Has the patient been a risk to others in the past 6 months? No.  Has the patient been a risk to others within the distant past? No.   Prior Inpatient Therapy:   Prior Outpatient Therapy:    Alcohol Screening: Patient refused Alcohol Screening Tool: Yes 1. How often do you have a drink containing alcohol?: Monthly or less 2. How many drinks containing alcohol do you have on a typical day when you are drinking?: 1 or 2 3. How often do you have six or more drinks on one occasion?: Never AUDIT-C Score: 1 4. How often during the last year have you found that you were not able to stop drinking once you had started?: Never 5. How often during the last year have you failed to do what was normally expected from you because of drinking?: Never 6. How often during the last year have you needed a first drink in the morning to get yourself going after a heavy drinking session?: Never 7. How often during the last year have you had a feeling of guilt of remorse after drinking?: Never 8. How often during the last year have you been unable to remember what happened the night before because you had been drinking?:  Never 9. Have you or someone else been injured as a result of your drinking?: No 10. Has a relative or friend or a doctor or another health worker been concerned about your drinking or suggested you cut down?: No Alcohol Use Disorder Identification Test Final Score (AUDIT): 1 Substance Abuse History in the last 12 months:  No. Consequences of Substance Abuse: NA Previous Psychotropic Medications:  unknown Psychological Evaluations: No  Past Medical History:  Past Medical History:  Diagnosis Date   Acute  psychosis (HCC)    Antisocial personality disorder (HCC)    Cannabis abuse    Past   Paranoid schizophrenia (HCC)    Schizoaffective disorder, bipolar type (HCC)    History reviewed. No pertinent surgical history. Family History: History reviewed. No pertinent family history. Family Psychiatric  History: unknown Tobacco Screening:   Social History:  Social History   Substance and Sexual Activity  Alcohol Use No     Social History   Substance and Sexual Activity  Drug Use Not Currently    Additional Social History:     Was in jail for 3 weeks prior to arrival in ER                      Allergies:   Allergies  Allergen Reactions   Invega Sustenna [Paliperidone Palmitate Er] Other (See Comments)    Caused drop in WBC and neutropenia following injection in August 2019 (he was not on other medications that could have caused this drop). Has tolerated it in the past (with no drop in WBC). Repeat CBC a few days later in August then showed improved ANC and WBC. Would recommend monitoring CBC while on this medication   Zyprexa [Olanzapine] Other (See Comments)    Reaction: unknown   Lab Results: No results found for this or any previous visit (from the past 48 hour(s)).  Blood Alcohol level:  Lab Results  Component Value Date   ETH <10 06/14/2021   ETH <10 04/13/2021    Metabolic Disorder Labs:  Lab Results  Component Value Date   HGBA1C 5.4 03/25/2018   MPG 108.28 03/25/2018   No results found for: PROLACTIN Lab Results  Component Value Date   CHOL 184 03/25/2018   TRIG 43 03/25/2018   HDL 50 03/25/2018   CHOLHDL 3.7 03/25/2018   VLDL 9 03/25/2018   LDLCALC 125 (H) 03/25/2018    Current Medications: Current Facility-Administered Medications  Medication Dose Route Frequency Provider Last Rate Last Admin   acetaminophen (TYLENOL) tablet 650 mg  650 mg Oral Q6H PRN Charm Rings, NP       alum & mag hydroxide-simeth (MAALOX/MYLANTA) 200-200-20 MG/5ML  suspension 30 mL  30 mL Oral Q4H PRN Charm Rings, NP       atorvastatin (LIPITOR) tablet 10 mg  10 mg Oral q1800 Deal, Adonis Housekeeper, RPH       benztropine (COGENTIN) tablet 1 mg  1 mg Oral BID Charm Rings, NP   1 mg at 06/23/21 0813   divalproex (DEPAKOTE) DR tablet 1,000 mg  1,000 mg Oral Earl Lites, Jamison Y, NP   1,000 mg at 06/23/21 0813   haloperidol (HALDOL) tablet 7.5 mg  7.5 mg Oral BID Charm Rings, NP   7.5 mg at 06/23/21 0813   magnesium hydroxide (MILK OF MAGNESIA) suspension 30 mL  30 mL Oral Daily PRN Charm Rings, NP       mirtazapine (REMERON) tablet 15 mg  15 mg Oral QHS Charm Rings, NP   15 mg at 06/22/21 2115   OLANZapine (ZYPREXA) tablet 10 mg  10 mg Oral BID Charm Rings, NP   10 mg at 06/23/21 0813   traZODone (DESYREL) tablet 100 mg  100 mg Oral QHS PRN Charm Rings, NP       PTA Medications: Medications Prior to Admission  Medication Sig Dispense Refill Last Dose   atorvastatin (LIPITOR) 10 MG tablet Take 1 tablet (10 mg total) by mouth daily at 6 PM. 30 tablet 0    benztropine (COGENTIN) 1 MG tablet Take 1 tablet (1 mg total) by mouth 2 (two) times daily. 60 tablet 0    divalproex (DEPAKOTE) 500 MG DR tablet Take 2 tablets (1,000 mg total) by mouth every morning. 60 tablet 0    haloperidol (HALDOL) 5 MG tablet Take 1 tablet (5 mg total) by mouth at bedtime. 30 tablet 0    mirtazapine (REMERON) 15 MG tablet Take 1 tablet (15 mg total) by mouth at bedtime. 30 tablet 0    OLANZapine (ZYPREXA) 10 MG tablet Take 1 tablet (10 mg total) by mouth 2 (two) times daily. 60 tablet 0    paliperidone (INVEGA SUSTENNA) 156 MG/ML SUSY injection Inject 1 mL (156 mg total) into the muscle once for 1 dose. (Patient not taking: Reported on 02/06/2021) 1 mL 0    paliperidone (INVEGA SUSTENNA) 234 MG/1.5ML SUSY injection Inject 234 mg into the muscle once for 1 dose. Next injection due 05/27/18 (Patient not taking: Reported on 02/06/2021) 1.5 mL 0    paliperidone (INVEGA)  6 MG 24 hr tablet Take 1 tablet (6 mg total) by mouth daily. (Patient not taking: No sig reported) 30 tablet 0    traZODone (DESYREL) 100 MG tablet Take 1 tablet (100 mg total) by mouth at bedtime as needed for sleep. (Patient not taking: Reported on 06/14/2021) 30 tablet 0     Musculoskeletal: Strength & Muscle Tone: within normal limits Gait & Station:  not checked Patient leans: Front            Psychiatric Specialty Exam:  Presentation  General Appearance: Disheveled; Bizarre  Eye Contact:Poor  Speech:Garbled  Speech Volume:Decreased  Handedness:Right   Mood and Affect  Mood:Anxious; Irritable  Affect:Constricted   Thought Process  Thought Processes:Disorganized; Irrevelant  Duration of Psychotic Symptoms: Greater than six months Past Diagnosis of Schizophrenia or Psychoactive disorder: Yes Descriptions of Associations:Tangential  Orientation:Partial  Thought Content:Delusions; Illogical; Paranoid Ideation  Hallucinations:Hallucinations: Auditory Ideas of Reference:Paranoia  Suicidal Thoughts:Suicidal Thoughts: No Homicidal Thoughts:Homicidal Thoughts: No  Sensorium  Memory:-- (UTA)  Judgment:Impaired  Insight:Lacking   Executive Functions  Concentration:Poor  Attention Span:Poor  Recall:Poor  Fund of Knowledge:Poor  Language:Poor   Psychomotor Activity  Psychomotor Activity:Psychomotor Activity: Decreased  Assets  Assets:Resilience   Sleep  Sleep:Sleep: Poor   Physical Exam: Physical Exam Constitutional:      Appearance: Normal appearance.  HENT:     Head: Normocephalic and atraumatic.     Nose: Nose normal.     Mouth/Throat:     Mouth: Mucous membranes are moist.  Eyes:     Extraocular Movements: Extraocular movements intact.     Conjunctiva/sclera: Conjunctivae normal.     Pupils: Pupils are equal, round, and reactive to light.  Cardiovascular:     Rate and Rhythm: Normal rate and regular rhythm.     Pulses:  Normal pulses.     Heart sounds: Normal heart sounds.  Pulmonary:  Effort: Pulmonary effort is normal.     Breath sounds: Normal breath sounds.  Abdominal:     General: Abdomen is flat. Bowel sounds are normal.  Musculoskeletal:        General: Normal range of motion.     Cervical back: Normal range of motion and neck supple.  Skin:    General: Skin is warm.  Neurological:     General: No focal deficit present.   Review of Systems  Constitutional: Negative.   HENT: Negative.    Eyes: Negative.   Respiratory: Negative.    Cardiovascular: Negative.   Gastrointestinal: Negative.   Genitourinary: Negative.   Musculoskeletal: Negative.   Skin: Negative.   Neurological: Negative.   Endo/Heme/Allergies: Negative.   Psychiatric/Behavioral:  Positive for hallucinations. The patient is nervous/anxious.   Blood pressure 114/70, pulse 97, temperature 98.8 F (37.1 C), temperature source Oral, resp. rate 18, height 5\' 10"  (1.778 m), weight 55.8 kg, SpO2 100 %. Body mass index is 17.65 kg/m.  Treatment Plan Summary: Daily contact with patient to assess and evaluate symptoms and progress in treatment, Medication management, and Plan continue current meds.   Observation Level/Precautions:  Elopement 15 minute checks  Laboratory:   none  Psychotherapy:    Medications:    Consultations:    Discharge Concerns:    Estimated LOS:  Other:     Physician Treatment Plan for Primary Diagnosis: <principal problem not specified> Long Term Goal(s): Improvement in symptoms so as ready for discharge  Short Term Goals: Ability to disclose and discuss suicidal ideas, Ability to identify and develop effective coping behaviors will improve, and Compliance with prescribed medications will improve  Physician Treatment Plan for Secondary Diagnosis: Active Problems:   Schizophrenia, paranoid type (HCC)  Long Term Goal(s): Improvement in symptoms so as ready for discharge  Short Term Goals: Ability  to demonstrate self-control will improve, Ability to maintain clinical measurements within normal limits will improve, and Compliance with prescribed medications will improve  I certify that inpatient services furnished can reasonably be expected to improve the patient's condition.    Ellamae Lybeck 11/12/202212:12 PM

## 2021-06-23 NOTE — Progress Notes (Addendum)
Patient present on the unit throughout the day with periods of isolation.Denies SI/HI/AH/VH. When asked questions during assessment. Patient shrugs shoulders and states, "I dont know." Takes medications with prompting. No adverse reactions noted. No s/s of distress. See pervious notes for details. No s/s of distress. Cont Q 15 minute check for safety.

## 2021-06-23 NOTE — Group Note (Signed)
LCSW Group Therapy Note  Group Date: 06/23/2021 Start Time: 1300 End Time: 1400   Type of Therapy and Topic:  Group Therapy - Healthy vs Unhealthy Coping Skills  Participation Level:  Did Not Attend   Description of Group The focus of this group was to determine what unhealthy coping techniques typically are used by group members and what healthy coping techniques would be helpful in coping with various problems. Patients were guided in becoming aware of the differences between healthy and unhealthy coping techniques. Patients were asked to identify 2-3 healthy coping skills they would like to learn to use more effectively.  Therapeutic Goals Patients learned that coping is what human beings do all day long to deal with various situations in their lives Patients defined and discussed healthy vs unhealthy coping techniques Patients identified their preferred coping techniques and identified whether these were healthy or unhealthy Patients determined 2-3 healthy coping skills they would like to become more familiar with and use more often. Patients provided support and ideas to each other   Summary of Patient Progress: Patient did not attend group despite encouraged participation.   Therapeutic Modalities Cognitive Behavioral Therapy Motivational Interviewing  Norberto Sorenson, Theresia Majors 06/23/2021  4:24 PM

## 2021-06-23 NOTE — Tx Team (Signed)
Initial Treatment Plan 06/23/2021 5:48 AM [name] KCC:[id number]    PATIENT STRESSORS: Financial difficulties   Medication change or noncompliance     PATIENT STRENGTHS: Motivation for treatment/growth    PATIENT IDENTIFIED PROBLEMS: Psychosis     Schizoaffective                  DISCHARGE CRITERIA:  Improved stabilization in mood, thinking, and/or behavior Motivation to continue treatment in a less acute level of care  PRELIMINARY DISCHARGE PLAN: Outpatient therapy  PATIENT/FAMILY INVOLVEMENT: This treatment plan has been presented to and reviewed with the patient, David Zentner Jr.,The patient and family have been given the opportunity to ask questions and make suggestions.  Trula Ore, RN 06/23/2021, 5:48 AM

## 2021-06-23 NOTE — Plan of Care (Signed)
Patient appears responding to internal stimuli, does not make appropriate eye contact, was offered emotional support, 15 minutes safety checks maintained will continue to monitor

## 2021-06-23 NOTE — Plan of Care (Signed)
Patient still exhibiting psychotic symptoms which include paranoia and delusions. Believes name on armband is incorrect and had to be prompted for medications. Refused to answer questions regarding auditory and visual hallucinations.  When asked by RN appropriate assessment, patient shrugged shoulders and shook head and stated, "I don't know." Will continue to monitor for safety.   Problem: Activity: Goal: Will verbalize the importance of balancing activity with adequate rest periods Outcome: Progressing   Problem: Education: Goal: Will be free of psychotic symptoms Outcome: Not Progressing Goal: Knowledge of the prescribed therapeutic regimen will improve Outcome: Progressing   Problem: Coping: Goal: Coping ability will improve Outcome: Not Progressing   Problem: Coping: Goal: Coping ability will improve Outcome: Not Progressing Goal: Will verbalize feelings Outcome: Not Progressing   Problem: Nutritional: Goal: Ability to achieve adequate nutritional intake will improve Outcome: Progressing

## 2021-06-23 NOTE — Progress Notes (Signed)
Patient admitted from ED after getting sent over by [facility] court  DA after coming in  court covered in blood per report. He appears to be paranoid and selectively mute when he is engaged in conversation. He refused to answer admission questions, admission search was completed and he was shown to his room. He requested snack on shift and took medications. He is currently in bed resting at this time.

## 2021-06-23 NOTE — B[facility] Counselor (Signed)
This CSW greeted patient at bedside in attempt to complete PSA. Patient did not arouse when greeted and was observed asleep in bed. CSW will make another attempt to complete PSA at a later time.   Albertine Patricia, MSW, [place], Bridget Hartshorn 06/23/2021 11:23AM

## 2021-06-24 NOTE — [facility] Suicide Risk Assessment (Signed)
[facility] Admission Suicide Risk Assessment   Nursing information obtained from:    Demographic factors:  Male Current Mental Status:  NA Loss Factors:  NA Historical Factors:  Domestic violence Risk Reduction Factors:  NA  Total Time spent with patient: 20 minutes Principal Problem: <principal problem not specified> Diagnosis:  Active Problems:   Schizophrenia, paranoid type (HCC)  Subjective Data: Patient presented on 06/14/2021,  to the ED under IVC from jail via the local sheriff's department.  He has been in custody in jail for 3 to 4 weeks in a private cell.  I reviewed his IVC paperwork, which includes the following: He reportedly had a hearing in front of a judge yesterday and was quite disruptive.  He was reportedly found "covered in blood" this morning and therefore deemed a danger to himself or others, placed under IVC and brought to the ER for evaluation.  Continued Clinical Symptoms:  Alcohol Use Disorder Identification Test Final Score (AUDIT): 1 The "Alcohol Use Disorders Identification Test", Guidelines for Use in Primary Care, Second Edition.  World Science writer [facility]). Score between 0-7:  no or low risk or alcohol related problems. Score between 8-15:  moderate risk of alcohol related problems. Score between 16-19:  high risk of alcohol related problems. Score 20 or above:  warrants further diagnostic evaluation for alcohol dependence and treatment.   CLINICAL FACTORS:   Schizophrenia:   Paranoid or undifferentiated type Previous Psychiatric Diagnoses and Treatments   Musculoskeletal: Strength & Muscle Tone: within normal limits Gait & Station: normal Patient leans: N/A  Psychiatric Specialty Exam:  Presentation  General Appearance: Casual; Neat  Eye Contact:Fleeting  Speech:Garbled  Speech Volume:Decreased  Handedness:Right   Mood and Affect  Mood:Anxious; Irritable  Affect:Constricted   Thought Process  Thought Processes:Disorganized;  Irrevelant  Descriptions of Associations:Loose  Orientation:Partial  Thought Content:Paranoid Ideation; Illogical  History of Schizophrenia/Schizoaffective disorder:Yes  Duration of Psychotic Symptoms:Greater than six months  Hallucinations:Hallucinations: Auditory  Ideas of Reference:Paranoia  Suicidal Thoughts:Suicidal Thoughts: No  Homicidal Thoughts:Homicidal Thoughts: No   Sensorium  Memory:Immediate Good  Judgment:Impaired  Insight:Poor   Executive Functions  Concentration:Poor  Attention Span:Poor  Recall:Fair  Fund of Knowledge:Fair  Language:Fair   Psychomotor Activity  Psychomotor Activity:Psychomotor Activity: Normal   Assets  Assets:Physical Health   Sleep  Sleep:Sleep: Fair    Physical Exam: Physical Exam ROS Blood pressure 114/70, pulse 97, temperature 98.8 F (37.1 C), temperature source Oral, resp. rate 18, height 5\' 10"  (1.778 m), weight 55.8 kg, SpO2 100 %. Body mass index is 17.65 kg/m.   COGNITIVE FEATURES THAT CONTRIBUTE TO RISK:  Closed-mindedness and Thought constriction (tunnel vision)    SUICIDE RISK:   Mild:  Suicidal ideation of limited frequency, intensity, duration, and specificity.  There are no identifiable plans, no associated intent, mild dysphoria and related symptoms, good self-control (both objective and subjective assessment), few other risk factors, and identifiable protective factors, including available and accessible social support.  PLAN OF CARE: Continue current meds.   I certify that inpatient services furnished can reasonably be expected to improve the patient's condition.   06/24/2021, 12:05 PM

## 2021-06-24 NOTE — Progress Notes (Signed)
Patient refused his medication at 1800 , Zyprexa, Haldol and Cogentin. Patient educated on the medication but continues to be selective mutism. Support and encouragement provided. Patient remains safe.

## 2021-06-24 NOTE — Progress Notes (Signed)
[facility] MD Progress Note  06/24/2021 12:04 PM David Davila.  MRN:  [id number] Subjective:  Patient has been up and about in the unit this morning and has been selectively mute. He has been taking his medicines and has been eating well.   Patient refused to talk to me after a few attempts and states" I have nothing to talk to you about". He is more visible on the floor today, is eating well and has showered.  Principal Problem: <principal problem not specified> Diagnosis: Active Problems:   Schizophrenia, paranoid type (HCC)  Total Time spent with patient: 20 minutes  Past Psychiatric History: Schizophrenia  Past Medical History:  Past Medical History:  Diagnosis Date   Acute psychosis (HCC)    Antisocial personality disorder (HCC)    Cannabis abuse    Past   Paranoid schizophrenia (HCC)    Schizoaffective disorder, bipolar type (HCC)    History reviewed. No pertinent surgical history. Family History: History reviewed. No pertinent family history. Family Psychiatric  History:  Social History:  Social History   Substance and Sexual Activity  Alcohol Use No     Social History   Substance and Sexual Activity  Drug Use Not Currently    Social History   Socioeconomic History   Marital status: Single    Spouse name: Not on file   Number of children: Not on file   Years of education: Not on file   Highest education level: Not on file  Occupational History   Not on file  Tobacco Use   Smoking status: Every Day    Packs/day: 1.00    Types: Cigarettes   Smokeless tobacco: Never  Vaping Use   Vaping Use: Unknown  Substance and Sexual Activity   Alcohol use: No   Drug use: Not Currently   Sexual activity: Not Currently  Other Topics Concern   Not on file  Social History Narrative   Not on file   Social Determinants of Health   Financial Resource Strain: Not on file  Food Insecurity: Not on file  Transportation Needs: Not on file  Physical Activity: Not  on file  Stress: Not on file  Social Connections: Not on file   Additional Social History:                         Sleep: Good  Appetite:  Good  Current Medications: Current Facility-Administered Medications  Medication Dose Route Frequency Provider Last Rate Last Admin   acetaminophen (TYLENOL) tablet 650 mg  650 mg Oral Q6H PRN Charm Rings, NP       alum & mag hydroxide-simeth (MAALOX/MYLANTA) 200-200-20 MG/5ML suspension 30 mL  30 mL Oral Q4H PRN Charm Rings, NP       atorvastatin (LIPITOR) tablet 10 mg  10 mg Oral q1800 Deal, Adonis Housekeeper, RPH       benztropine (COGENTIN) tablet 1 mg  1 mg Oral BID Charm Rings, NP   1 mg at 06/24/21 0806   divalproex (DEPAKOTE) DR tablet 1,000 mg  1,000 mg Oral Earl Lites, Jamison Y, NP   1,000 mg at 06/24/21 2841   haloperidol (HALDOL) tablet 7.5 mg  7.5 mg Oral BID Charm Rings, NP   7.5 mg at 06/24/21 0805   magnesium hydroxide (MILK OF MAGNESIA) suspension 30 mL  30 mL Oral Daily PRN Charm Rings, NP       mirtazapine (REMERON) tablet 15 mg  15  mg Oral QHS Charm Rings, NP   15 mg at 06/22/21 2115   OLANZapine (ZYPREXA) tablet 10 mg  10 mg Oral BID Charm Rings, NP   10 mg at 06/24/21 0809   traZODone (DESYREL) tablet 100 mg  100 mg Oral QHS PRN Charm Rings, NP        Lab Results: No results found for this or any previous visit (from the past 48 hour(s)).  Blood Alcohol level:  Lab Results  Component Value Date   ETH <10 06/14/2021   ETH <10 04/13/2021    Metabolic Disorder Labs: Lab Results  Component Value Date   HGBA1C 5.4 03/25/2018   MPG 108.28 03/25/2018   No results found for: PROLACTIN Lab Results  Component Value Date   CHOL 184 03/25/2018   TRIG 43 03/25/2018   HDL 50 03/25/2018   CHOLHDL 3.7 03/25/2018   VLDL 9 03/25/2018   LDLCALC 125 (H) 03/25/2018    Physical Findings: AIMS: Facial and Oral Movements Muscles of Facial Expression: None, normal Lips and Perioral Area: None,  normal Jaw: None, normal Tongue: None, normal,Extremity Movements Upper (arms, wrists, hands, fingers): None, normal Lower (legs, knees, ankles, toes): None, normal, Trunk Movements Neck, shoulders, hips: None, normal, Overall Severity Severity of abnormal movements (highest score from questions above): None, normal Incapacitation due to abnormal movements: None, normal Patient's awareness of abnormal movements (rate only patient's report): No Awareness, Dental Status Current problems with teeth and/or dentures?: No Does patient usually wear dentures?: No  CIWA:    COWS:     Musculoskeletal: Strength & Muscle Tone: within normal limits Gait & Station: normal Patient leans: N/A  Psychiatric Specialty Exam:  Presentation  General Appearance: Casual; Neat  Eye Contact:Fleeting  Speech:Garbled  Speech Volume:Decreased  Handedness:Right   Mood and Affect  Mood:Anxious; Irritable  Affect:Constricted   Thought Process  Thought Processes:Disorganized; Irrevelant  Descriptions of Associations:Loose  Orientation:Partial  Thought Content:Paranoid Ideation; Illogical  History of Schizophrenia/Schizoaffective disorder:Yes  Duration of Psychotic Symptoms:Greater than six months  Hallucinations:Hallucinations: Auditory  Ideas of Reference:Paranoia  Suicidal Thoughts:Suicidal Thoughts: No  Homicidal Thoughts:Homicidal Thoughts: No   Sensorium  Memory:Immediate Good  Judgment:Impaired  Insight:Poor   Executive Functions  Concentration:Poor  Attention Span:Poor  Recall:Fair  Fund of Knowledge:Fair  Language:Fair   Psychomotor Activity  Psychomotor Activity:Psychomotor Activity: Normal   Assets  Assets:Physical Health   Sleep  Sleep:Sleep: Fair    Physical Exam: Physical Exam Constitutional:      Appearance: Normal appearance. He is normal weight.  HENT:     Head: Normocephalic and atraumatic.     Nose: Nose normal.     Mouth/Throat:      Mouth: Mucous membranes are moist.     Pharynx: Oropharynx is clear.  Eyes:     Extraocular Movements: Extraocular movements intact.     Conjunctiva/sclera: Conjunctivae normal.     Pupils: Pupils are equal, round, and reactive to light.  Cardiovascular:     Rate and Rhythm: Normal rate and regular rhythm.     Pulses: Normal pulses.     Heart sounds: Normal heart sounds.  Pulmonary:     Effort: Pulmonary effort is normal.     Breath sounds: Normal breath sounds.  Abdominal:     General: Abdomen is flat. Bowel sounds are normal.     Palpations: Abdomen is soft.  Musculoskeletal:        General: Normal range of motion.     Cervical back: Normal range of  motion and neck supple.  Skin:    General: Skin is warm.  Neurological:     General: No focal deficit present.     Mental Status: He is alert.   Review of Systems  Constitutional: Negative.   HENT: Negative.    Eyes: Negative.   Respiratory: Negative.    Cardiovascular: Negative.   Gastrointestinal: Negative.   Genitourinary: Negative.   Musculoskeletal: Negative.   Skin: Negative.   Neurological: Negative.   Endo/Heme/Allergies: Negative.   Psychiatric/Behavioral:  Positive for hallucinations and substance abuse. The patient is nervous/anxious.   Blood pressure 114/70, pulse 97, temperature 98.8 F (37.1 C), temperature source Oral, resp. rate 18, height 5\' 10"  (1.778 m), weight 55.8 kg, SpO2 100 %. Body mass index is 17.65 kg/m.   Treatment Plan Summary: Daily contact with patient to assess and evaluate symptoms and progress in treatment, Medication management, and Plan : continue current meds.   06/24/2021, 12:04 PM

## 2021-06-24 NOTE — Progress Notes (Signed)
   06/24/21 0900  Psych Admission Type (Psych Patients Only)  Admission Status Involuntary  Psychosocial Assessment  Patient Complaints Irritability;Isolation;Suspiciousness  Eye Contact Brief;Suspiciousness  Facial Expression Flat  Affect Apprehensive;Preoccupied  Speech Soft  Interaction Guarded;Isolative;Cautious  Motor Activity Other (Comment) (normal)  Appearance/Hygiene In scrubs  Behavior Characteristics Guarded  Mood Suspicious;Preoccupied  Aggressive Behavior  Effect No apparent injury  Thought Process  Coherency Disorganized  Content Paranoia  Delusions None reported or observed  Perception WDL  Hallucination None reported or observed  Judgment Poor  Confusion WDL  Danger to Self  Current suicidal ideation? Denies  Danger to Others  Danger to Others None reported or observed   D. Patient has been selective mutism Denies SI/HI/A/VH. Compliant with medications. Support and  encouragement provided as needed.  A. Scheduled medications administered per Provider order. Support and encouragement provided. Routine safety checks conducted every 15 minutes. Patient notified to inform staff with problems or concerns. R. No adverse drug reactions noted. Patient contracts for safety at this time.

## 2021-06-24 NOTE — [facility] Counselor (Addendum)
ADDENDUM:  CSW placed call to patient's legal guardian, David Davila [phone]) in attempt to complete PSA on behalf of patient. Phone line did not trill and went to voicemail. CSW left HIPAA compliant voicemail asking guardian to return call to [facility] nurse's station [phone]) and ask to speak with social work.  Albertine Patricia, MSW, [place], Bridget Hartshorn 06/24/2021 9:28AM   This CSW approached patient on the milieu iin attempt to complete PSA with patient. CSW introduced self and role and asked patient if he would be willing to complete an assessment with CSW. Patient responded, "I need a legal lawyer". This CSW explained the purpose of the assessment and asked patient a second time if he would complete an assessment. Patient responded, "not until I get a legal lawyer".  Patient has a legal guardian. This CSW will make contact with legal guardian to complete PSA on behalf of patient due to patient's inability to communicate with CSW at this time.   Albertine Patricia, MSW, [place], [place] 06/24/2021 9:22AM

## 2021-06-24 NOTE — Group Note (Signed)
LCSW Group Therapy Note  Group Date: 06/24/2021 Start Time: 1315 End Time: 1400   Type of Therapy and Topic:  Group Therapy - How To Cope with Nervousness about Discharge   Participation Level:  Did Not Attend   Description of Group This process group involved identification of patients' feelings about discharge. Some of them are scheduled to be discharged soon, while others are new admissions, but each of them was asked to share thoughts and feelings surrounding discharge from the hospital. One common theme was that they are excited at the prospect of going home, while another was that many of them are apprehensive about sharing why they were hospitalized. Patients were given the opportunity to discuss these feelings with their peers in preparation for discharge.  Therapeutic Goals  Patient will identify their overall feelings about pending discharge. Patient will think about how they might proactively address issues that they believe will once again arise once they get home (i.e. with parents). Patients will participate in discussion about having hope for change.   Summary of Patient Progress: Patient did not attend group despite encouraged participation.   Therapeutic Modalities Cognitive Behavioral Therapy   Norberto Sorenson, Theresia Majors 06/24/2021  2:17 PM

## 2021-06-25 DIAGNOSIS — F200 Paranoid schizophrenia: Principal | ICD-10-CM

## 2021-06-25 NOTE — Progress Notes (Signed)
Patient pacing in hallway this am. Refused to speak with nurse and answer high risk assessment questions. Refused to take medication. See MAR and previous progress note for details. Will cont Q 15 minute check for safety.

## 2021-06-25 NOTE — Group Note (Signed)
[facility] LCSW Group Therapy Note    Group Date: 06/25/2021 Start Time: 1300 End Time: 1400  Type of Therapy and Topic:  Group Therapy:  Overcoming Obstacles  Participation Level:  [facility] PARTICIPATION LEVEL: None  Mood:  Description of Group:   In this group patients will be encouraged to explore what they see as obstacles to their own wellness and recovery. They will be guided to discuss their thoughts, feelings, and behaviors related to these obstacles. The group will process together ways to cope with barriers, with attention given to specific choices patients can make. Each patient will be challenged to identify changes they are motivated to make in order to overcome their obstacles. This group will be process-oriented, with patients participating in exploration of their own experiences as well as giving and receiving support and challenge from other group members.  Therapeutic Goals: 1. Patient will identify personal and current obstacles as they relate to admission. 2. Patient will identify barriers that currently interfere with their wellness or overcoming obstacles.  3. Patient will identify feelings, thought process and behaviors related to these barriers. 4. Patient will identify two changes they are willing to make to overcome these obstacles:    Summary of Patient Progress   Patient was present in group, however, did not engage in group discussions.    Therapeutic Modalities:   Cognitive Behavioral Therapy Solution Focused Therapy Motivational Interviewing Relapse Prevention Therapy   Harden Mo, LCSW

## 2021-06-25 NOTE — BH IP Treatment Plan (Signed)
Interdisciplinary Treatment and Diagnostic Plan Update  06/25/2021 Time of Session: 9:00 AM David Davila. MRN: [id number]  Principal Diagnosis: <principal problem not specified>  Secondary Diagnoses: Active Problems:   Schizophrenia, paranoid type ()   Current Medications:  Current Facility-Administered Medications  Medication Dose Route Frequency Provider Last Rate Last Admin   acetaminophen (TYLENOL) tablet 650 mg  650 mg Oral Q6H PRN Patrecia Pour, NP       alum & mag hydroxide-simeth (MAALOX/MYLANTA) 200-200-20 MG/5ML suspension 30 mL  30 mL Oral Q4H PRN Patrecia Pour, NP       atorvastatin (LIPITOR) tablet 10 mg  10 mg Oral q1800 Deal, Justice Britain, RPH       benztropine (COGENTIN) tablet 1 mg  1 mg Oral BID Patrecia Pour, NP   1 mg at 06/24/21 0806   divalproex (DEPAKOTE) DR tablet 1,000 mg  1,000 mg Oral Aurora Mask, NP   1,000 mg at 06/24/21 6237   haloperidol (HALDOL) tablet 7.5 mg  7.5 mg Oral BID Patrecia Pour, NP   7.5 mg at 06/24/21 0805   magnesium hydroxide (MILK OF MAGNESIA) suspension 30 mL  30 mL Oral Daily PRN Patrecia Pour, NP       mirtazapine (REMERON) tablet 15 mg  15 mg Oral QHS Patrecia Pour, NP   15 mg at 06/22/21 2115   OLANZapine (ZYPREXA) tablet 10 mg  10 mg Oral BID Patrecia Pour, NP   10 mg at 06/24/21 0809   traZODone (DESYREL) tablet 100 mg  100 mg Oral QHS PRN Patrecia Pour, NP       PTA Medications: Medications Prior to Admission  Medication Sig Dispense Refill Last Dose   atorvastatin (LIPITOR) 10 MG tablet Take 1 tablet (10 mg total) by mouth daily at 6 PM. 30 tablet 0    benztropine (COGENTIN) 1 MG tablet Take 1 tablet (1 mg total) by mouth 2 (two) times daily. 60 tablet 0    divalproex (DEPAKOTE) 500 MG DR tablet Take 2 tablets (1,000 mg total) by mouth every morning. 60 tablet 0    haloperidol (HALDOL) 5 MG tablet Take 1 tablet (5 mg total) by mouth at bedtime. 30 tablet 0    mirtazapine (REMERON) 15 MG  tablet Take 1 tablet (15 mg total) by mouth at bedtime. 30 tablet 0    OLANZapine (ZYPREXA) 10 MG tablet Take 1 tablet (10 mg total) by mouth 2 (two) times daily. 60 tablet 0    paliperidone (INVEGA SUSTENNA) 156 MG/ML SUSY injection Inject 1 mL (156 mg total) into the muscle once for 1 dose. (Patient not taking: Reported on 02/06/2021) 1 mL 0    paliperidone (INVEGA SUSTENNA) 234 MG/1.5ML SUSY injection Inject 234 mg into the muscle once for 1 dose. Next injection due 05/27/18 (Patient not taking: Reported on 02/06/2021) 1.5 mL 0    paliperidone (INVEGA) 6 MG 24 hr tablet Take 1 tablet (6 mg total) by mouth daily. (Patient not taking: No sig reported) 30 tablet 0    traZODone (DESYREL) 100 MG tablet Take 1 tablet (100 mg total) by mouth at bedtime as needed for sleep. (Patient not taking: Reported on 06/14/2021) 30 tablet 0     Patient Stressors: Financial difficulties   Medication change or noncompliance    Patient Strengths: Motivation for treatment/growth   Treatment Modalities: Medication Management, Group therapy, Case management,  1 to 1 session with clinician, Psychoeducation, Recreational therapy.   Physician  Treatment Plan for Primary Diagnosis: <principal problem not specified> Long Term Goal(s): Improvement in symptoms so as ready for discharge   Short Term Goals: Ability to demonstrate self-control will improve Ability to maintain clinical measurements within normal limits will improve Compliance with prescribed medications will improve Ability to disclose and discuss suicidal ideas Ability to identify and develop effective coping behaviors will improve  Medication Management: Evaluate patient's response, side effects, and tolerance of medication regimen.  Therapeutic Interventions: 1 to 1 sessions, Unit Group sessions and Medication administration.  Evaluation of Outcomes: Not Met  Physician Treatment Plan for Secondary Diagnosis: Active Problems:   Schizophrenia, paranoid  type ([facility])  Long Term Goal(s): Improvement in symptoms so as ready for discharge   Short Term Goals: Ability to demonstrate self-control will improve Ability to maintain clinical measurements within normal limits will improve Compliance with prescribed medications will improve Ability to disclose and discuss suicidal ideas Ability to identify and develop effective coping behaviors will improve     Medication Management: Evaluate patient's response, side effects, and tolerance of medication regimen.  Therapeutic Interventions: 1 to 1 sessions, Unit Group sessions and Medication administration.  Evaluation of Outcomes: Not Met   RN Treatment Plan for Primary Diagnosis: <principal problem not specified> Long Term Goal(s): Knowledge of disease and therapeutic regimen to maintain health will improve  Short Term Goals: Ability to remain free from injury will improve, Ability to verbalize frustration and anger appropriately will improve, Ability to demonstrate self-control, Ability to participate in decision making will improve, Ability to verbalize feelings will improve, Ability to disclose and discuss suicidal ideas, Ability to identify and develop effective coping behaviors will improve, and Compliance with prescribed medications will improve  Medication Management: RN will administer medications as ordered by provider, will assess and evaluate patient's response and provide education to patient for prescribed medication. RN will report any adverse and/or side effects to prescribing provider.  Therapeutic Interventions: 1 on 1 counseling sessions, Psychoeducation, Medication administration, Evaluate responses to treatment, Monitor vital signs and CBGs as ordered, Perform/monitor CIWA, COWS, AIMS and Fall Risk screenings as ordered, Perform wound care treatments as ordered.  Evaluation of Outcomes: Not Met   LCSW Treatment Plan for Primary Diagnosis: <principal problem not specified> Long  Term Goal(s): Safe transition to appropriate next level of care at discharge, Engage patient in therapeutic group addressing interpersonal concerns.  Short Term Goals: Engage patient in aftercare planning with referrals and resources, Increase social support, Increase ability to appropriately verbalize feelings, Increase emotional regulation, Facilitate acceptance of mental health diagnosis and concerns, and Increase skills for wellness and recovery  Therapeutic Interventions: Assess for all discharge needs, 1 to 1 time with Social worker, Explore available resources and support systems, Assess for adequacy in community support network, Educate family and significant other(s) on suicide prevention, Complete Psychosocial Assessment, Interpersonal group therapy.  Evaluation of Outcomes: Not Met   Progress in Treatment: Attending groups: No. Participating in groups: No. Taking medication as prescribed: No. Toleration medication: No. Family/Significant other contact made: Yes, individual(s) contacted:  legal guardian, Marisue Humble. Patient understands diagnosis: No. Discussing patient identified problems/goals with staff: No. Medical problems stabilized or resolved: Yes. Denies suicidal/homicidal ideation: Yes. Issues/concerns per patient self-inventory: No. Other: none.  New problem(s) identified: No, Describe:  none.  New Short Term/Long Term Goal(s): elimination of symptoms of psychosis, medication management for mood stabilization; elimination of SI thoughts; development of comprehensive mental wellness/sobriety plan.  Patient Goals: "I don't know what they brought me to the  hospital for."   Discharge Plan or Barriers: CSW will assist pt/guardian with development of an appropriate aftercare/discharge plan.  Reason for Continuation of Hospitalization: Delusions  Medication stabilization  Estimated Length of Stay: 1-7 days   Scribe for Treatment Team: Shirl Harris,  LCSW 06/25/2021 10:27 AM

## 2021-06-25 NOTE — Progress Notes (Signed)
Patient remains present on the milieu today yet isolative from staff and peers. Observed responding to internal stimuli at dinner when RN observed patient laughing to himself. Cont Q15 minute check for safety.

## 2021-06-25 NOTE — Progress Notes (Signed)
Patient required encouragement to take medication this shift. Patient appears to be responding to internal stimuli. Though Patient denies it. Denies SI/HI/A/VH, Patient is pacing down the hall but pleasant."They were not giving me food in the jail they said my color has changed and my family will not talk to me. Support and encouragement provided as needed.

## 2021-06-25 NOTE — Plan of Care (Signed)
Patient continues to refuse medications, pace on the unit, present in milieu but does not interact with staff and peers.  Problem: Activity: Goal: Will verbalize the importance of balancing activity with adequate rest periods Outcome: Not Progressing   Problem: Education: Goal: Will be free of psychotic symptoms Outcome: Not Progressing Goal: Knowledge of the prescribed therapeutic regimen will improve Outcome: Not Progressing   Problem: Coping: Goal: Coping ability will improve Outcome: Not Progressing Goal: Will verbalize feelings Outcome: Not Progressing   Problem: Coping: Goal: Will verbalize feelings Outcome: Not Progressing   Problem: Health Behavior/Discharge Planning: Goal: Compliance with prescribed medication regimen will improve Outcome: Not Progressing   Problem: Role Relationship: Goal: Ability to communicate needs accurately will improve Outcome: Not Progressing Goal: Ability to interact with others will improve Outcome: Not Progressing   Problem: Nutritional: Goal: Ability to achieve adequate nutritional intake will improve Outcome: Progressing   Problem: Health Behavior/Discharge Planning: Goal: Compliance with prescribed medication regimen will improve Outcome: Not Progressing

## 2021-06-25 NOTE — Progress Notes (Signed)
Patient alert and oriented x 2 with periods of confusion to time and situation, he's isolated to his room, brief eye contact with Clinical research associate, he denies SI/HI/AVH minimal interaction with peers and staff. Patient refused scheduled evening medication no distress noted, 15 minutes safety checks maintained will continue to monitor.

## 2021-06-25 NOTE — Progress Notes (Addendum)
Patient pacing in hallway this am. Prompted by RN's x2 to take medication. Patient continues to pace and ignores both RN's prompting for medication. States, "I'm not taking no medication. I have to go to court." As RN continued to attempted to explain patient interrupted and starting yelling out stating, "I don't want to hear you."
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
## 2021-06-25 NOTE — Progress Notes (Signed)
[facility] MD Progress Note  06/25/2021 7:23 PM David Davila.  MRN:  [id number] Subjective: Follow-up for this 40 year old man with schizophrenia.  Patient continues to be very disorganized and inappropriate.  Attended treatment team today.  Conversation made no sense.  Unable to engage in appropriate back-and-forth.  Refusing medicine very consistently now. Principal Problem: Schizophrenia, paranoid type (HCC) Diagnosis: Principal Problem:   Schizophrenia, paranoid type (HCC) Active Problems:   Noncompliance with medication regimen   TBI (traumatic brain injury)  Total Time spent with patient: 30 minutes  Past Psychiatric History: Patient has a history of schizophrenia or schizoaffective disorder with episodes of severe psychosis.  Past Medical History:  Past Medical History:  Diagnosis Date   Acute psychosis (HCC)    Antisocial personality disorder (HCC)    Cannabis abuse    Past   Paranoid schizophrenia (HCC)    Schizoaffective disorder, bipolar type (HCC)    History reviewed. No pertinent surgical history. Family History: History reviewed. No pertinent family history. Family Psychiatric  History: See previous Social History:  Social History   Substance and Sexual Activity  Alcohol Use No     Social History   Substance and Sexual Activity  Drug Use Not Currently    Social History   Socioeconomic History   Marital status: Single    Spouse name: Not on file   Number of children: Not on file   Years of education: Not on file   Highest education level: Not on file  Occupational History   Not on file  Tobacco Use   Smoking status: Every Day    Packs/day: 1.00    Types: Cigarettes   Smokeless tobacco: Never  Vaping Use   Vaping Use: Unknown  Substance and Sexual Activity   Alcohol use: No   Drug use: Not Currently   Sexual activity: Not Currently  Other Topics Concern   Not on file  Social History Narrative   Not on file   Social Determinants of  Health   Financial Resource Strain: Not on file  Food Insecurity: Not on file  Transportation Needs: Not on file  Physical Activity: Not on file  Stress: Not on file  Social Connections: Not on file   Additional Social History:                         Sleep: Fair  Appetite:  Fair  Current Medications: Current Facility-Administered Medications  Medication Dose Route Frequency Provider Last Rate Last Admin   acetaminophen (TYLENOL) tablet 650 mg  650 mg Oral Q6H PRN Charm Rings, NP       alum & mag hydroxide-simeth (MAALOX/MYLANTA) 200-200-20 MG/5ML suspension 30 mL  30 mL Oral Q4H PRN Charm Rings, NP       atorvastatin (LIPITOR) tablet 10 mg  10 mg Oral q1800 Deal, Adonis Housekeeper, RPH       benztropine (COGENTIN) tablet 1 mg  1 mg Oral BID Charm Rings, NP   1 mg at 06/24/21 0806   divalproex (DEPAKOTE) DR tablet 1,000 mg  1,000 mg Oral Earl Lites, Jamison Y, NP   1,000 mg at 06/24/21 2841   haloperidol (HALDOL) tablet 7.5 mg  7.5 mg Oral BID Charm Rings, NP   7.5 mg at 06/24/21 0805   magnesium hydroxide (MILK OF MAGNESIA) suspension 30 mL  30 mL Oral Daily PRN Charm Rings, NP       mirtazapine (REMERON) tablet 15 mg  15 mg Oral QHS Charm Rings, NP   15 mg at 06/22/21 2115   OLANZapine (ZYPREXA) tablet 10 mg  10 mg Oral BID Charm Rings, NP   10 mg at 06/24/21 0809   traZODone (DESYREL) tablet 100 mg  100 mg Oral QHS PRN Charm Rings, NP        Lab Results: No results found for this or any previous visit (from the past 48 hour(s)).  Blood Alcohol level:  Lab Results  Component Value Date   ETH <10 06/14/2021   ETH <10 04/13/2021    Metabolic Disorder Labs: Lab Results  Component Value Date   HGBA1C 5.4 03/25/2018   MPG 108.28 03/25/2018   No results found for: PROLACTIN Lab Results  Component Value Date   CHOL 184 03/25/2018   TRIG 43 03/25/2018   HDL 50 03/25/2018   CHOLHDL 3.7 03/25/2018   VLDL 9 03/25/2018   LDLCALC 125 (H)  03/25/2018    Physical Findings: AIMS: Facial and Oral Movements Muscles of Facial Expression: None, normal Lips and Perioral Area: None, normal Jaw: None, normal Tongue: None, normal,Extremity Movements Upper (arms, wrists, hands, fingers): None, normal Lower (legs, knees, ankles, toes): None, normal, Trunk Movements Neck, shoulders, hips: None, normal, Overall Severity Severity of abnormal movements (highest score from questions above): None, normal Incapacitation due to abnormal movements: None, normal Patient's awareness of abnormal movements (rate only patient's report): No Awareness, Dental Status Current problems with teeth and/or dentures?: No Does patient usually wear dentures?: No  CIWA:    COWS:     Musculoskeletal: Strength & Muscle Tone: within normal limits Gait & Station: normal Patient leans: N/A  Psychiatric Specialty Exam:  Presentation  General Appearance: Casual; Neat  Eye Contact:Fleeting  Speech:Garbled  Speech Volume:Decreased  Handedness:Right   Mood and Affect  Mood:Anxious; Irritable  Affect:Constricted   Thought Process  Thought Processes:Disorganized; Irrevelant  Descriptions of Associations:Loose  Orientation:Partial  Thought Content:Paranoid Ideation; Illogical  History of Schizophrenia/Schizoaffective disorder:Yes  Duration of Psychotic Symptoms:Greater than six months  Hallucinations:Hallucinations: Auditory  Ideas of Reference:Paranoia  Suicidal Thoughts:Suicidal Thoughts: No  Homicidal Thoughts:Homicidal Thoughts: No   Sensorium  Memory:Immediate Good  Judgment:Impaired  Insight:Poor   Executive Functions  Concentration:Poor  Attention Span:Poor  Recall:Fair  Fund of Knowledge:Fair  Language:Fair   Psychomotor Activity  Psychomotor Activity:Psychomotor Activity: Normal   Assets  Assets:Physical Health   Sleep  Sleep:Sleep: Fair    Physical Exam: Physical Exam Vitals and nursing note  reviewed.  Constitutional:      Appearance: Normal appearance.  HENT:     Head: Normocephalic and atraumatic.     Mouth/Throat:     Pharynx: Oropharynx is clear.  Eyes:     Pupils: Pupils are equal, round, and reactive to light.  Cardiovascular:     Rate and Rhythm: Normal rate and regular rhythm.  Pulmonary:     Effort: Pulmonary effort is normal.     Breath sounds: Normal breath sounds.  Abdominal:     General: Abdomen is flat.     Palpations: Abdomen is soft.  Musculoskeletal:        General: Normal range of motion.  Skin:    General: Skin is warm and dry.  Neurological:     General: No focal deficit present.     Mental Status: He is alert. Mental status is at baseline.  Psychiatric:        Attention and Perception: He is inattentive.  Mood and Affect: Affect is inappropriate.        Speech: He is noncommunicative. Speech is tangential.        Thought Content: Thought content is delusional.        Cognition and Memory: Cognition is impaired.        Judgment: Judgment is inappropriate.   Review of Systems  Constitutional: Negative.   HENT: Negative.    Eyes: Negative.   Respiratory: Negative.    Cardiovascular: Negative.   Gastrointestinal: Negative.   Musculoskeletal: Negative.   Skin: Negative.   Neurological: Negative.   Blood pressure 114/67, pulse 73, temperature 98.2 F (36.8 C), temperature source Oral, resp. rate 18, height 5\' 10"  (1.778 m), weight 55.8 kg, SpO2 98 %. Body mass index is 17.65 kg/m.   Treatment Plan Summary: Medication management and Plan patient received a shot of Invega in the emergency room but is now continuing to be noncompliant with medicine.  Probably we will need to do forced medicine again to get him back on his and try to get him back on his other medicines.  In the past he has been able to achieve some stability when he was fully compliant.  Hinda Glatter, MD 06/25/2021, 7:23 PM

## 2021-06-25 NOTE — [facility] Suicide Risk Assessment (Signed)
[facility] INPATIENT:  Family/Significant Other Suicide Prevention Education  Suicide Prevention Education:  Education Completed; Ane Payment, [place] guardian, [phone],  (name of family member/significant other) has been identified by the patient as the family member/significant other with whom the patient will be residing, and identified as the person(s) who will aid the patient in the event of a mental health crisis (suicidal ideations/suicide attempt).  With written consent from the patient, the family member/significant other has been provided the following suicide prevention education, prior to the and/or following the discharge of the patient.  The suicide prevention education provided includes the following: Suicide risk factors Suicide prevention and interventions National Suicide Hotline telephone number [facility] assessment telephone number [facility] Emergency Assistance 911 [facility] and/or Residential Mobile Crisis Unit telephone number  Request made of family/significant other to: Remove weapons (e.g., guns, rifles, knives), all items previously/currently identified as safety concern.   Remove drugs/medications (over-the-counter, prescriptions, illicit drugs), all items previously/currently identified as a safety concern.  The family member/significant other verbalizes understanding of the suicide prevention education information provided.  The family member/significant other agrees to remove the items of safety concern listed above.  Guardian reports that patient is a danger to self or others when he is not stable.  He reports that the patient was picked up for  a probation violation and was covered in blood.  It remains unclear whose blood was on the patient.  He reports that the patient can be difficult to understand and often people have to "read between the lines" when talking to him.    Harden Mo 06/25/2021, 3:18 PM

## 2021-06-26 NOTE — Progress Notes (Signed)
Recreation Therapy Notes  INPATIENT RECREATION THERAPY ASSESSMENT  Patient Details Name: David Davila. MRN: [id number] DOB: 1981-02-05 Today's Date: 06/26/2021       Information Obtained From: Patient (Patient declined expressed he has talked to someone already.)  Able to Participate in Assessment/Interview:    Patient Presentation:    Reason for Admission (Per Patient):    Patient Stressors:    Coping Skills:      Leisure Interests (2+):     Frequency of Recreation/Participation:    Awareness of Community Resources:     [employer]:     Current Use:    If no, Barriers?:    Expressed Interest in [employer] Information:    [place] of Residence:     Patient Main Form of Transportation:    Patient Strengths:     Patient Identified Areas of Improvement:     Patient Goal for Hospitalization:     Current SI (including self-harm):     Current HI:     Current AVH:    Staff Intervention Plan:    Consent to Intern Participation:    Lavern Crimi 06/26/2021, 4:22 PM

## 2021-06-26 NOTE — Plan of Care (Signed)
Patient remains isolated this shift. Does not participate in milieu therapy or perform own ADLs. Nurse continues to observe patient responding to internal stimuli. Patient refuses to have blood drawn today. Is visible on unit for meals. Cont Q15 minute check for safety.  Problem: Activity: Goal: Will verbalize the importance of balancing activity with adequate rest periods Outcome: Not Progressing   Problem: Education: Goal: Will be free of psychotic symptoms Outcome: Not Progressing Goal: Knowledge of the prescribed therapeutic regimen will improve Outcome: Not Progressing   Problem: Coping: Goal: Coping ability will improve Outcome: Not Progressing   Problem: Nutritional: Goal: Ability to achieve adequate nutritional intake will improve Outcome: Progressing   Problem: Role Relationship: Goal: Ability to communicate needs accurately will improve Outcome: Not Progressing   Problem: Safety: Goal: Ability to redirect hostility and anger into socially appropriate behaviors will improve Outcome: Not Progressing Goal: Ability to remain free from injury will improve Outcome: Progressing   Problem: Self-Care: Goal: Ability to participate in self-care as condition permits will improve Outcome: Not Progressing   Problem: Self-Concept: Goal: Will verbalize positive feelings about self Outcome: Not Progressing   Problem: Self-Care: Goal: Ability to participate in self-care as condition permits will improve Outcome: Not Progressing

## 2021-06-26 NOTE — Progress Notes (Signed)
Recreation Therapy Notes  Date: 06/26/2021  Time: 10:00 am      Location:  Craft room    Behavioral response: N/A   Intervention Topic: Decision Making    Discussion/Intervention: Patient did not attend group.   Clinical Observations/Feedback:  Patient did not attend group.   Kloey Cazarez LRT/CTRS          Somalia Segler 06/26/2021 11:59 AM

## 2021-06-26 NOTE — Progress Notes (Signed)
Patient sitting in chair this am after taking meds. RN asked patient if she could take his vital signs and he agreed. When RN attempted to take temperature axillary patient became angry and snatched cord out of RN's hand. Stating,"You cant take this under my arm. You think I'm stupid." Rn attempted to redirect patient and explain ways temperature can be taken and reason for taking axillary temp. Patient continued to yell and defend self. Patient eventually walked away.   Patient observed responding to internal stimuli but refused to answer high risk assessment questions. Cont to monitor for safety.

## 2021-06-26 NOTE — Progress Notes (Signed)
[facility] MD Progress Note  06/26/2021 11:08 AM David Davila.  MRN:  [id number] Subjective: Follow-up for this 40 year old man with schizophrenia.  Patient seen today.  Today he has been compliant with his medication.  He can tell an immediate difference really because he was actually relatively polite and cooperative during our interview much more than he was at treatment team yesterday.  Patient remains confused and he is aware of it.  His descriptions of the events that happened recently before going to jail show how idiosyncratic and confusing his thought pattern can be.  Apparently they had moved him from transitional housing to independent living not long ago, but somehow he did not understand that that was supposed to be a positive thing.  Instead he says that he was moved into a facility where there were women and this confused him greatly so he had to run away.  He also described a little bit more about the episode of feeding dogs that somehow got him in trouble.  From what I can understand it sounds like he took quite a bit of food from the facility he was at and gave it to some dogs for reasons that do not make much sense and then understandably got in trouble since it sounds like it was several pounds of meat.  Anyway he is a little more lucid today and is certainly more polite.  He still refuses to consider long-acting injections saying that it "hurts worse than anything in the world" but seems to be more open to compliance with medication. Principal Problem: Schizophrenia, paranoid type (HCC) Diagnosis: Principal Problem:   Schizophrenia, paranoid type (HCC) Active Problems:   Noncompliance with medication regimen   TBI (traumatic brain injury)  Total Time spent with patient: 30 minutes  Past Psychiatric History: History of schizophrenia.  Off medicine becomes very disorganized but on medicine usually improves quite a bit.  Did well when he was on long-acting injectables or in a  supervised environment.  I suspect that he would not be reliable in taking oral medicine all by himself over the long term.  Past Medical History:  Past Medical History:  Diagnosis Date   Acute psychosis (HCC)    Antisocial personality disorder (HCC)    Cannabis abuse    Past   Paranoid schizophrenia (HCC)    Schizoaffective disorder, bipolar type (HCC)    History reviewed. No pertinent surgical history. Family History: History reviewed. No pertinent family history. Family Psychiatric  History: See previous Social History:  Social History   Substance and Sexual Activity  Alcohol Use No     Social History   Substance and Sexual Activity  Drug Use Not Currently    Social History   Socioeconomic History   Marital status: Single    Spouse name: Not on file   Number of children: Not on file   Years of education: Not on file   Highest education level: Not on file  Occupational History   Not on file  Tobacco Use   Smoking status: Every Day    Packs/day: 1.00    Types: Cigarettes   Smokeless tobacco: Never  Vaping Use   Vaping Use: Unknown  Substance and Sexual Activity   Alcohol use: No   Drug use: Not Currently   Sexual activity: Not Currently  Other Topics Concern   Not on file  Social History Narrative   Not on file   Social Determinants of Health   Financial Resource Strain: Not on  file  Food Insecurity: Not on file  Transportation Needs: Not on file  Physical Activity: Not on file  Stress: Not on file  Social Connections: Not on file   Additional Social History:                         Sleep: Good  Appetite:  Good  Current Medications: Current Facility-Administered Medications  Medication Dose Route Frequency Provider Last Rate Last Admin   acetaminophen (TYLENOL) tablet 650 mg  650 mg Oral Q6H PRN Charm Rings, NP       alum & mag hydroxide-simeth (MAALOX/MYLANTA) 200-200-20 MG/5ML suspension 30 mL  30 mL Oral Q4H PRN Charm Rings,  NP       atorvastatin (LIPITOR) tablet 10 mg  10 mg Oral q1800 Deal, Adonis Housekeeper, RPH   10 mg at 06/25/21 2100   benztropine (COGENTIN) tablet 1 mg  1 mg Oral BID Charm Rings, NP   1 mg at 06/26/21 0759   divalproex (DEPAKOTE) DR tablet 1,000 mg  1,000 mg Oral Earl Lites, Jamison Y, NP   1,000 mg at 06/26/21 0759   haloperidol (HALDOL) tablet 7.5 mg  7.5 mg Oral BID Charm Rings, NP   7.5 mg at 06/26/21 0758   magnesium hydroxide (MILK OF MAGNESIA) suspension 30 mL  30 mL Oral Daily PRN Charm Rings, NP       mirtazapine (REMERON) tablet 15 mg  15 mg Oral QHS Charm Rings, NP   15 mg at 06/25/21 2106   OLANZapine (ZYPREXA) tablet 10 mg  10 mg Oral BID Charm Rings, NP   10 mg at 06/26/21 0759   traZODone (DESYREL) tablet 100 mg  100 mg Oral QHS PRN Charm Rings, NP        Lab Results: No results found for this or any previous visit (from the past 48 hour(s)).  Blood Alcohol level:  Lab Results  Component Value Date   ETH <10 06/14/2021   ETH <10 04/13/2021    Metabolic Disorder Labs: Lab Results  Component Value Date   HGBA1C 5.4 03/25/2018   MPG 108.28 03/25/2018   No results found for: PROLACTIN Lab Results  Component Value Date   CHOL 184 03/25/2018   TRIG 43 03/25/2018   HDL 50 03/25/2018   CHOLHDL 3.7 03/25/2018   VLDL 9 03/25/2018   LDLCALC 125 (H) 03/25/2018    Physical Findings: AIMS: Facial and Oral Movements Muscles of Facial Expression: None, normal Lips and Perioral Area: None, normal Jaw: None, normal Tongue: None, normal,Extremity Movements Upper (arms, wrists, hands, fingers): None, normal Lower (legs, knees, ankles, toes): None, normal, Trunk Movements Neck, shoulders, hips: None, normal, Overall Severity Severity of abnormal movements (highest score from questions above): None, normal Incapacitation due to abnormal movements: None, normal Patient's awareness of abnormal movements (rate only patient's report): No Awareness, Dental  Status Current problems with teeth and/or dentures?: No Does patient usually wear dentures?: No  CIWA:    COWS:     Musculoskeletal: Strength & Muscle Tone: within normal limits Gait & Station: normal Patient leans: N/A  Psychiatric Specialty Exam:  Presentation  General Appearance: Casual; Neat  Eye Contact:Fleeting  Speech:Garbled  Speech Volume:Decreased  Handedness:Right   Mood and Affect  Mood:Anxious; Irritable  Affect:Constricted   Thought Process  Thought Processes:Disorganized; Irrevelant  Descriptions of Associations:Loose  Orientation:Partial  Thought Content:Paranoid Ideation; Illogical  History of Schizophrenia/Schizoaffective disorder:Yes  Duration of Psychotic  Symptoms:Greater than six months  Hallucinations:No data recorded Ideas of Reference:Paranoia  Suicidal Thoughts:No data recorded Homicidal Thoughts:No data recorded  Sensorium  Memory:Immediate Good  Judgment:Impaired  Insight:Poor   Executive Functions  Concentration:Poor  Attention Span:Poor  Recall:Fair  Fund of Knowledge:Fair  Language:Fair   Psychomotor Activity  Psychomotor Activity:No data recorded  Assets  Assets:Physical Health   Sleep  Sleep:No data recorded   Physical Exam: Physical Exam Vitals and nursing note reviewed.  Constitutional:      Appearance: Normal appearance.  HENT:     Head: Normocephalic and atraumatic.     Mouth/Throat:     Pharynx: Oropharynx is clear.  Eyes:     Pupils: Pupils are equal, round, and reactive to light.  Cardiovascular:     Rate and Rhythm: Normal rate and regular rhythm.  Pulmonary:     Effort: Pulmonary effort is normal.     Breath sounds: Normal breath sounds.  Abdominal:     General: Abdomen is flat.     Palpations: Abdomen is soft.  Musculoskeletal:        General: Normal range of motion.  Skin:    General: Skin is warm and dry.  Neurological:     General: No focal deficit present.     Mental  Status: He is alert. Mental status is at baseline.  Psychiatric:        Attention and Perception: He is inattentive.        Mood and Affect: Mood normal. Affect is blunt.        Speech: Speech is tangential.        Behavior: Behavior is slowed.        Thought Content: Thought content is paranoid. Thought content does not include homicidal or suicidal ideation.        Cognition and Memory: Cognition is impaired.        Judgment: Judgment is inappropriate.   Review of Systems  Constitutional: Negative.   HENT: Negative.    Eyes: Negative.   Respiratory: Negative.    Cardiovascular: Negative.   Gastrointestinal: Negative.   Musculoskeletal: Negative.   Skin: Negative.   Neurological: Negative.   Psychiatric/Behavioral:  Positive for memory loss. Negative for depression, hallucinations, substance abuse and suicidal ideas. The patient is nervous/anxious.   Blood pressure (!) 89/68, pulse (!) 130, temperature 98.2 F (36.8 C), temperature source Oral, resp. rate 18, height 5\' 10"  (1.778 m), weight 55.8 kg, SpO2 98 %. Body mass index is 17.65 kg/m.   Treatment Plan Summary: Plan still of course very psychotic.  He opened our conversation by asking me if I would prefer if he ate food or took medicine.  When I told him that I would prefer if he did both of those things he said that food was only for free people and medicine was only for prisoners.  I do not think he was trying to be symbolic with this I think he is just really psychotic.  For now I am not going to change any of the medicine because I do not want to startle him or give him another reason to balk at taking the medicine if he is possibly going to become more compliant.  Continue with the Haldol and Zyprexa.  Continue with Depakote even though the current dose is probably lower than he ultimately needs.  Hope that we can gradually getting well enough that they would accept him back to jail which seems to be the inevitable discharge  plan.  I  did not bring that up with him today again because I did not want to upset him.  David Rasmussen, MD 06/26/2021, 11:08 AM

## 2021-06-26 NOTE — Progress Notes (Signed)
Patient pacing the unit this evening but was pleasant with this Clinical research associate. Patient denies SI/HI/AVH but presents as responding to internal stimuli. Patient observed interacting appropriately with staff and peers on the unit. Patient compliant with medication administration per MD orders. Pt given education, support, and encouragement to be active in his treatment plan. Pt being monitored Q 15 minutes for safety per unit protocol. Pt remains safe on the unit.

## 2021-06-26 NOTE — Group Note (Signed)
[facility] LCSW Group Therapy Note   Group Date: 06/26/2021 Start Time: 1310 End Time: 1420  Type of Therapy/Topic:  Group Therapy:  Feelings about Diagnosis  Participation Level:  Did Not Attend      Description of Group:    This group will allow patients to explore their thoughts and feelings about diagnoses they have received. Patients will be guided to explore their level of understanding and acceptance of these diagnoses. Facilitator will encourage patients to process their thoughts and feelings about the reactions of others to their diagnosis, and will guide patients in identifying ways to discuss their diagnosis with significant others in their lives. This group will be process-oriented, with patients participating in exploration of their own experiences as well as giving and receiving support and challenge from other group members.   Therapeutic Goals: 1. Patient will demonstrate understanding of diagnosis as evidence by identifying two or more symptoms of the disorder:  2. Patient will be able to express two feelings regarding the diagnosis 3. Patient will demonstrate ability to communicate their needs through discussion and/or role plays  Summary of Patient Progress:    x    Therapeutic Modalities:   Cognitive Behavioral Therapy Brief Therapy Feelings Identification    Thierry Dobosz F Sharone Picchi, Student-Social Work 

## 2021-06-26 NOTE — Progress Notes (Signed)
Recreation Therapy Notes  INPATIENT RECREATION TR PLAN  Patient Details Name: David Davila. MRN: [id number] DOB: [birth year] Today's Date: 06/26/2021  Rec Therapy Plan Is patient appropriate for Therapeutic Recreation?: Yes Treatment times per week: at least 3 Estimated Length of Stay: 5-7 days TR Treatment/Interventions: Group participation (Comment)  Discharge Criteria Pt will be discharged from therapy if:: Discharged Treatment plan/goals/alternatives discussed and agreed upon by:: Patient/family  Discharge Summary     Chetan Mehring 06/26/2021, 4:22 PM

## 2021-06-27 DIAGNOSIS — F200 Paranoid schizophrenia: Secondary | ICD-10-CM | POA: Diagnosis not present

## 2021-06-27 MED ORDER — HALOPERIDOL 5 MG PO TABS
10.0000 mg | ORAL_TABLET | Freq: Two times a day (BID) | ORAL | Status: DC
  Administered 2021-06-28 – 2021-07-15 (×31): 10 mg via ORAL
  Filled 2021-06-27 (×34): qty 2

## 2021-06-27 NOTE — Progress Notes (Signed)
Patient pacing the unit this evening but was pleasant with this writer. Patient denies SI/HI/AVH but presents as responding to internal stimuli. Patient observed interacting appropriately with staff and peers on the unit. Patient compliant with medication administration per MD orders. Pt given education, support, and encouragement to be active in his treatment plan. Pt being monitored Q 15 minutes for safety per unit protocol. Pt remains safe on the unit.  

## 2021-06-27 NOTE — Group Note (Signed)
[facility] LCSW Group Therapy Note   Group Date: 06/27/2021 Start Time: 1310 End Time: 1415   Type of Therapy/Topic:  Group Therapy:  Emotion Regulation  Participation Level:  Did Not Attend   Mood:  Description of Group:    The purpose of this group is to assist patients in learning to regulate negative emotions and experience positive emotions. Patients will be guided to discuss ways in which they have been vulnerable to their negative emotions. These vulnerabilities will be juxtaposed with experiences of positive emotions or situations, and patients challenged to use positive emotions to combat negative ones. Special emphasis will be placed on coping with negative emotions in conflict situations, and patients will process healthy conflict resolution skills.  Therapeutic Goals: Patient will identify two positive emotions or experiences to reflect on in order to balance out negative emotions:  Patient will label two or more emotions that they find the most difficult to experience:  Patient will be able to demonstrate positive conflict resolution skills through discussion or role plays:   Summary of Patient Progress: X   Therapeutic Modalities:   Cognitive Behavioral Therapy Feelings Identification Dialectical Behavioral Therapy   Mazikeen Hehn R Tradarius Reinwald, LCSW 

## 2021-06-27 NOTE — Progress Notes (Signed)
Recreation Therapy Notes  Date: 06/27/2021  Time: 10:00 am      Location:  Craft room    Behavioral response: N/A   Intervention Topic: Stress Management    Discussion/Intervention: Patient did not attend group.   Clinical Observations/Feedback:  Patient did not attend group.   Havard Radigan LRT/CTRS          Adaly Puder 06/27/2021 11:55 AM

## 2021-06-27 NOTE — Progress Notes (Signed)
[facility] MD Progress Note  06/27/2021 5:23 PM David Davila.  MRN:  [id number] Subjective: Follow-up 40 year old man with schizophrenia.  Patient has been taking his medicine today.  Consequently he is a little more sedated and has been in his room more.  Not very cooperative with the interview today.  Yesterday afternoon apparently he went off angrily on one of the nurses for some psychotic reason.  No report so far today of any outbursts of behavior. Principal Problem: Schizophrenia, paranoid type (HCC) Diagnosis: Principal Problem:   Schizophrenia, paranoid type (HCC) Active Problems:   Noncompliance with medication regimen   TBI (traumatic brain injury)  Total Time spent with patient: 30 minutes  Past Psychiatric History: Past history of schizophrenia  Past Medical History:  Past Medical History:  Diagnosis Date   Acute psychosis (HCC)    Antisocial personality disorder (HCC)    Cannabis abuse    Past   Paranoid schizophrenia (HCC)    Schizoaffective disorder, bipolar type (HCC)    History reviewed. No pertinent surgical history. Family History: History reviewed. No pertinent family history. Family Psychiatric  History: See previous Social History:  Social History   Substance and Sexual Activity  Alcohol Use No     Social History   Substance and Sexual Activity  Drug Use Not Currently    Social History   Socioeconomic History   Marital status: Single    Spouse name: Not on file   Number of children: Not on file   Years of education: Not on file   Highest education level: Not on file  Occupational History   Not on file  Tobacco Use   Smoking status: Every Day    Packs/day: 1.00    Types: Cigarettes   Smokeless tobacco: Never  Vaping Use   Vaping Use: Unknown  Substance and Sexual Activity   Alcohol use: No   Drug use: Not Currently   Sexual activity: Not Currently  Other Topics Concern   Not on file  Social History Narrative   Not on file    Social Determinants of Health   Financial Resource Strain: Not on file  Food Insecurity: Not on file  Transportation Needs: Not on file  Physical Activity: Not on file  Stress: Not on file  Social Connections: Not on file   Additional Social History:                         Sleep: Fair  Appetite:  Fair  Current Medications: Current Facility-Administered Medications  Medication Dose Route Frequency Provider Last Rate Last Admin   acetaminophen (TYLENOL) tablet 650 mg  650 mg Oral Q6H PRN Charm Rings, NP       alum & mag hydroxide-simeth (MAALOX/MYLANTA) 200-200-20 MG/5ML suspension 30 mL  30 mL Oral Q4H PRN Charm Rings, NP       atorvastatin (LIPITOR) tablet 10 mg  10 mg Oral q1800 Deal, Adonis Housekeeper, RPH   10 mg at 06/26/21 2104   benztropine (COGENTIN) tablet 1 mg  1 mg Oral BID Charm Rings, NP   1 mg at 06/27/21 0744   divalproex (DEPAKOTE) DR tablet 1,000 mg  1,000 mg Oral Earl Lites, Jamison Y, NP   1,000 mg at 06/27/21 0744   [START ON 06/28/2021] haloperidol (HALDOL) tablet 10 mg  10 mg Oral BID Isabellamarie Randa T, MD       magnesium hydroxide (MILK OF MAGNESIA) suspension 30 mL  30 mL  Oral Daily PRN Charm Rings, NP       mirtazapine (REMERON) tablet 15 mg  15 mg Oral QHS Charm Rings, NP   15 mg at 06/26/21 2104   OLANZapine (ZYPREXA) tablet 10 mg  10 mg Oral BID Charm Rings, NP   10 mg at 06/27/21 0744   traZODone (DESYREL) tablet 100 mg  100 mg Oral QHS PRN Charm Rings, NP   100 mg at 06/26/21 2104    Lab Results: No results found for this or any previous visit (from the past 48 hour(s)).  Blood Alcohol level:  Lab Results  Component Value Date   ETH <10 06/14/2021   ETH <10 04/13/2021    Metabolic Disorder Labs: Lab Results  Component Value Date   HGBA1C 5.4 03/25/2018   MPG 108.28 03/25/2018   No results found for: PROLACTIN Lab Results  Component Value Date   CHOL 184 03/25/2018   TRIG 43 03/25/2018   HDL 50 03/25/2018    CHOLHDL 3.7 03/25/2018   VLDL 9 03/25/2018   LDLCALC 125 (H) 03/25/2018    Physical Findings: AIMS: Facial and Oral Movements Muscles of Facial Expression: None, normal Lips and Perioral Area: None, normal Jaw: None, normal Tongue: None, normal,Extremity Movements Upper (arms, wrists, hands, fingers): None, normal Lower (legs, knees, ankles, toes): None, normal, Trunk Movements Neck, shoulders, hips: None, normal, Overall Severity Severity of abnormal movements (highest score from questions above): None, normal Incapacitation due to abnormal movements: None, normal Patient's awareness of abnormal movements (rate only patient's report): No Awareness, Dental Status Current problems with teeth and/or dentures?: No Does patient usually wear dentures?: No  CIWA:    COWS:     Musculoskeletal: Strength & Muscle Tone: within normal limits Gait & Station: normal Patient leans: N/A  Psychiatric Specialty Exam:  Presentation  General Appearance: Casual; Neat  Eye Contact:Fleeting  Speech:Garbled  Speech Volume:Decreased  Handedness:Right   Mood and Affect  Mood:Anxious; Irritable  Affect:Constricted   Thought Process  Thought Processes:Disorganized; Irrevelant  Descriptions of Associations:Loose  Orientation:Partial  Thought Content:Paranoid Ideation; Illogical  History of Schizophrenia/Schizoaffective disorder:Yes  Duration of Psychotic Symptoms:Greater than six months  Hallucinations:No data recorded Ideas of Reference:Paranoia  Suicidal Thoughts:No data recorded Homicidal Thoughts:No data recorded  Sensorium  Memory:Immediate Good  Judgment:Impaired  Insight:Poor   Executive Functions  Concentration:Poor  Attention Span:Poor  Recall:Fair  Fund of Knowledge:Fair  Language:Fair   Psychomotor Activity  Psychomotor Activity:No data recorded  Assets  Assets:Physical Health   Sleep  Sleep:No data recorded   Physical Exam: Physical  Exam Vitals and nursing note reviewed.  Constitutional:      Appearance: Normal appearance.  HENT:     Head: Normocephalic and atraumatic.     Mouth/Throat:     Pharynx: Oropharynx is clear.  Eyes:     Pupils: Pupils are equal, round, and reactive to light.  Cardiovascular:     Rate and Rhythm: Normal rate and regular rhythm.  Pulmonary:     Effort: Pulmonary effort is normal.     Breath sounds: Normal breath sounds.  Abdominal:     General: Abdomen is flat.     Palpations: Abdomen is soft.  Musculoskeletal:        General: Normal range of motion.  Skin:    General: Skin is warm and dry.  Neurological:     General: No focal deficit present.     Mental Status: He is alert. Mental status is at baseline.  Psychiatric:  Attention and Perception: He is inattentive.        Mood and Affect: Mood normal. Affect is blunt and inappropriate.        Speech: He is noncommunicative.        Behavior: Behavior is slowed.        Thought Content: Thought content is paranoid and delusional.        Cognition and Memory: Cognition is impaired.   Review of Systems  Constitutional: Negative.   HENT: Negative.    Eyes: Negative.   Respiratory: Negative.    Cardiovascular: Negative.   Gastrointestinal: Negative.   Musculoskeletal: Negative.   Skin: Negative.   Neurological: Negative.   Psychiatric/Behavioral:  Negative for depression, hallucinations, substance abuse and suicidal ideas. The patient is nervous/anxious.   Blood pressure 128/72, pulse 78, temperature 98.6 F (37 C), temperature source Oral, resp. rate 18, height 5\' 10"  (1.778 m), weight 55.8 kg, SpO2 100 %. Body mass index is 17.65 kg/m.   Treatment Plan Summary: Medication management and Plan increased dose of Haldol to 10 mg twice a day.  Continue current dose of Depakote which was just raised.  Urged patient to attend groups.  Daily reassessment of psychosis and behavior to assess when he will be ready to return to the  jail environment  , MD 06/27/2021, 5:23 PM

## 2021-06-27 NOTE — Progress Notes (Signed)
Patient is fixitated on name being incorrect on arm band and insist that staff call him "David Davila". He says, why does my bracelet says Bartholome Bill if my name is [name]. Patient refuse to comply with treatment or talk to staff if name is not addressed correctly. Takes several staff to redirect him.

## 2021-06-27 NOTE — Progress Notes (Signed)
Patient awake and alert some throughout the day. More isolative than usual today. Takes am medications and is present for meals without any problems but needs prompting for vital signs. Refuses blood draw and evening meds. Disorganized in thoughts and appearance and does not answer high risk assessment questions about SI/HI/AH/VH or any other assessment questions.   Will cont to monitor for safety.

## 2021-06-27 NOTE — Plan of Care (Signed)
Patient continues to pace the unit and is observed responding to internal stimuli. Patient willing takes mediations periodically but refuses at time. Continues to refuse lab draws. Will cont to monitor.  Problem: Activity: Goal: Will verbalize the importance of balancing activity with adequate rest periods Outcome: Not Progressing   Problem: Education: Goal: Will be free of psychotic symptoms Outcome: Not Progressing Goal: Knowledge of the prescribed therapeutic regimen will improve Outcome: Not Progressing   Problem: Coping: Goal: Coping ability will improve Outcome: Not Progressing Goal: Will verbalize feelings Outcome: Not Progressing   Problem: Health Behavior/Discharge Planning: Goal: Compliance with prescribed medication regimen will improve Outcome: Not Progressing   Problem: Nutritional: Goal: Ability to achieve adequate nutritional intake will improve Outcome: Not Progressing   Problem: Role Relationship: Goal: Ability to communicate needs accurately will improve Outcome: Not Progressing Goal: Ability to interact with others will improve Outcome: Not Progressing   Problem: Safety: Goal: Ability to redirect hostility and anger into socially appropriate behaviors will improve Outcome: Not Progressing Goal: Ability to remain free from injury will improve Outcome: Not Progressing   Problem: Self-Care: Goal: Ability to participate in self-care as condition permits will improve Outcome: Not Progressing   Problem: Self-Concept: Goal: Will verbalize positive feelings about self Outcome: Not Progressing   Problem: Consults Goal: Concurrent Medical Patient Education Description: (See Patient Education Module for education specifics) Outcome: Not Progressing   Problem: [facility] Concurrent Medical Problem Goal: LTG-Pt will be physically stable and he/significant other Description: (Patient will be physically stable and he/significant other will be able to verbalize  understanding of follow-up care and symptoms that would warrant further treatment) Outcome: Not Progressing Goal: STG-Vital signs will be within defined limits or stabilized Description: (STG- Vital signs will be within defined limits or stabilized for individual) Outcome: Not Progressing Goal: STG-Compliance with medication and/or treatment as ordered Description: (STG-Compliance with medication and/or treatment as ordered by MD) Outcome: Not Progressing Goal: STG-Verbalize two symptoms that would warrant further Description: (STG-Verbalize two symptoms that would warrant further treatment) Outcome: Not Progressing Goal: STG-Patient will participate in management/stabilization Description: (STG-Patient will participate in management/stabilization of medical condition) Outcome: Not Progressing Goal: STG-Other (Specify): Description: STG-Other Concurrent Medical (Specify): Outcome: Not Progressing

## 2021-06-27 NOTE — Progress Notes (Signed)
Pt refused VS this am °

## 2021-06-28 DIAGNOSIS — F200 Paranoid schizophrenia: Secondary | ICD-10-CM | POA: Diagnosis not present

## 2021-06-28 NOTE — Group Note (Signed)
[facility] LCSW Group Therapy Note   Group Date: 06/28/2021 Start Time: 1300 End Time: 1400   Type of Therapy/Topic:  Group Therapy:  Balance in Life  Participation Level:  None   Description of Group:    This group will address the concept of balance and how it feels and looks when one is unbalanced. Patients will be encouraged to process areas in their lives that are out of balance, and identify reasons for remaining unbalanced. Facilitators will guide patients utilizing problem- solving interventions to address and correct the stressor making their life unbalanced. Understanding and applying boundaries will be explored and addressed for obtaining  and maintaining a balanced life. Patients will be encouraged to explore ways to assertively make their unbalanced needs known to significant others in their lives, using other group members and facilitator for support and feedback.  Therapeutic Goals: Patient will identify two or more emotions or situations they have that consume much of in their lives. Patient will identify signs/triggers that life has become out of balance:  Patient will identify two ways to set boundaries in order to achieve balance in their lives:  Patient will demonstrate ability to communicate their needs through discussion and/or role plays  Summary of Patient Progress: Group not held at this time due to the acuity of the unit.     Therapeutic Modalities:   Cognitive Behavioral Therapy Solution-Focused Therapy Assertiveness Training   Elisabet Gutzmer J Ourania Hamler, LCSW 

## 2021-06-28 NOTE — Plan of Care (Signed)
Patient continues to exhibit bizarre/disorganized behaviors. Occasionally mute. Refused to talk to Clinical research associate but accepted vital signs check. Ate breakfast and received AM medications. Expressed no concerns.

## 2021-06-28 NOTE — Progress Notes (Signed)
[facility] MD Progress Note  06/28/2021 4:08 PM [name]  MRN:  [id number] Subjective: Follow-up for 40 year old man with schizophrenia.  Went to see patient this afternoon.  He very pointedly refused to talk with me.  He was wide awake sitting on his bed but he turned and stared out the window when I spoke to him.  Tried to give him space and not press him but he was not willing to communicate or discuss anything.  Seems to still be pretty psychotic much of the time.  Taking his medicine.  No complaints. Principal Problem: Schizophrenia, paranoid type (HCC) Diagnosis: Principal Problem:   Schizophrenia, paranoid type (HCC) Active Problems:   Noncompliance with medication regimen   TBI (traumatic brain injury)  Total Time spent with patient: 30 minutes  Past Psychiatric History: Past history of recurrent psychotic disorder with difficulty functioning outside the hospital  Past Medical History:  Past Medical History:  Diagnosis Date   Acute psychosis (HCC)    Antisocial personality disorder (HCC)    Cannabis abuse    Past   Paranoid schizophrenia (HCC)    Schizoaffective disorder, bipolar type (HCC)    History reviewed. No pertinent surgical history. Family History: History reviewed. No pertinent family history. Family Psychiatric  History: See previous Social History:  Social History   Substance and Sexual Activity  Alcohol Use No     Social History   Substance and Sexual Activity  Drug Use Not Currently    Social History   Socioeconomic History   Marital status: Single    Spouse name: Not on file   Number of children: Not on file   Years of education: Not on file   Highest education level: Not on file  Occupational History   Not on file  Tobacco Use   Smoking status: Every Day    Packs/day: 1.00    Types: Cigarettes   Smokeless tobacco: Never  Vaping Use   Vaping Use: Unknown  Substance and Sexual Activity   Alcohol use: No   Drug use: Not  Currently   Sexual activity: Not Currently  Other Topics Concern   Not on file  Social History Narrative   Not on file   Social Determinants of Health   Financial Resource Strain: Not on file  Food Insecurity: Not on file  Transportation Needs: Not on file  Physical Activity: Not on file  Stress: Not on file  Social Connections: Not on file   Additional Social History:                         Sleep: Fair  Appetite:  Fair  Current Medications: Current Facility-Administered Medications  Medication Dose Route Frequency Provider Last Rate Last Admin   acetaminophen (TYLENOL) tablet 650 mg  650 mg Oral Q6H PRN Charm Rings, NP       alum & mag hydroxide-simeth (MAALOX/MYLANTA) 200-200-20 MG/5ML suspension 30 mL  30 mL Oral Q4H PRN Charm Rings, NP       atorvastatin (LIPITOR) tablet 10 mg  10 mg Oral q1800 Deal, Adonis Housekeeper, RPH   10 mg at 06/27/21 2135   benztropine (COGENTIN) tablet 1 mg  1 mg Oral BID Charm Rings, NP   1 mg at 06/28/21 0830   divalproex (DEPAKOTE) DR tablet 1,000 mg  1,000 mg Oral Blenda Mounts, NP   1,000 mg at 06/28/21 0830   haloperidol (HALDOL) tablet 10 mg  10 mg  Oral BID Pattye Meda, Jackquline Denmark, MD   10 mg at 06/28/21 7829   magnesium hydroxide (MILK OF MAGNESIA) suspension 30 mL  30 mL Oral Daily PRN Charm Rings, NP       mirtazapine (REMERON) tablet 15 mg  15 mg Oral QHS Charm Rings, NP   15 mg at 06/27/21 2135   OLANZapine (ZYPREXA) tablet 10 mg  10 mg Oral BID Charm Rings, NP   10 mg at 06/28/21 0830   traZODone (DESYREL) tablet 100 mg  100 mg Oral QHS PRN Charm Rings, NP   100 mg at 06/27/21 2135    Lab Results: No results found for this or any previous visit (from the past 48 hour(s)).  Blood Alcohol level:  Lab Results  Component Value Date   ETH <10 06/14/2021   ETH <10 04/13/2021    Metabolic Disorder Labs: Lab Results  Component Value Date   HGBA1C 5.4 03/25/2018   MPG 108.28 03/25/2018   No results  found for: PROLACTIN Lab Results  Component Value Date   CHOL 184 03/25/2018   TRIG 43 03/25/2018   HDL 50 03/25/2018   CHOLHDL 3.7 03/25/2018   VLDL 9 03/25/2018   LDLCALC 125 (H) 03/25/2018    Physical Findings: AIMS: Facial and Oral Movements Muscles of Facial Expression: None, normal Lips and Perioral Area: None, normal Jaw: None, normal Tongue: None, normal,Extremity Movements Upper (arms, wrists, hands, fingers): None, normal Lower (legs, knees, ankles, toes): None, normal, Trunk Movements Neck, shoulders, hips: None, normal, Overall Severity Severity of abnormal movements (highest score from questions above): None, normal Incapacitation due to abnormal movements: None, normal Patient's awareness of abnormal movements (rate only patient's report): No Awareness, Dental Status Current problems with teeth and/or dentures?: No Does patient usually wear dentures?: No  CIWA:    COWS:     Musculoskeletal: Strength & Muscle Tone: within normal limits Gait & Station: normal Patient leans: N/A  Psychiatric Specialty Exam:  Presentation  General Appearance: Casual; Neat  Eye Contact:Fleeting  Speech:Garbled  Speech Volume:Decreased  Handedness:Right   Mood and Affect  Mood:Anxious; Irritable  Affect:Constricted   Thought Process  Thought Processes:Disorganized; Irrevelant  Descriptions of Associations:Loose  Orientation:Partial  Thought Content:Paranoid Ideation; Illogical  History of Schizophrenia/Schizoaffective disorder:Yes  Duration of Psychotic Symptoms:Greater than six months  Hallucinations:No data recorded Ideas of Reference:Paranoia  Suicidal Thoughts:No data recorded Homicidal Thoughts:No data recorded  Sensorium  Memory:Immediate Good  Judgment:Impaired  Insight:Poor   Executive Functions  Concentration:Poor  Attention Span:Poor  Recall:Fair  Fund of Knowledge:Fair  Language:Fair   Psychomotor Activity  Psychomotor  Activity:No data recorded  Assets  Assets:Physical Health   Sleep  Sleep:No data recorded   Physical Exam: Physical Exam Vitals and nursing note reviewed.  Constitutional:      Appearance: Normal appearance.  HENT:     Head: Normocephalic and atraumatic.     Mouth/Throat:     Pharynx: Oropharynx is clear.  Eyes:     Pupils: Pupils are equal, round, and reactive to light.  Cardiovascular:     Rate and Rhythm: Normal rate and regular rhythm.  Pulmonary:     Effort: Pulmonary effort is normal.     Breath sounds: Normal breath sounds.  Abdominal:     General: Abdomen is flat.     Palpations: Abdomen is soft.  Musculoskeletal:        General: Normal range of motion.  Skin:    General: Skin is warm and dry.  Neurological:  General: No focal deficit present.     Mental Status: He is alert. Mental status is at baseline.  Psychiatric:        Attention and Perception: He is inattentive.        Mood and Affect: Mood normal. Affect is blunt.        Speech: He is noncommunicative.        Behavior: Behavior is withdrawn.   Review of Systems  Unable to perform ROS: Psychiatric disorder  Psychiatric/Behavioral:  Negative for depression and suicidal ideas.   Blood pressure 122/78, pulse (!) 114, temperature 98.7 F (37.1 C), resp. rate 18, height 5\' 10"  (1.778 m), weight 55.8 kg, SpO2 97 %. Body mass index is 17.65 kg/m.   Treatment Plan Summary: Plan continue current medicine.  He is on Depakote Haldol and Zyprexa.  May just take more time.  Gave him room in the conversation did not be hospital and encouraged him to please let know how we can help and tried to reassure him we were only trying to help him be able to get out of the hospital.  Encourage group attendance.  [place], MD 06/28/2021, 4:08 PM

## 2021-06-28 NOTE — Progress Notes (Signed)
Recreation Therapy Notes Date: 06/28/2021  Time: 10:30 am      Location:  Craft room    Behavioral response: N/A   Intervention Topic: Creative expressions   Discussion/Intervention: Patient did not attend group.   Clinical Observations/Feedback:  Patient did not attend group.   Xiao Graul LRT/CTRS        Collis Thede 06/28/2021 12:50 PM

## 2021-06-29 DIAGNOSIS — F200 Paranoid schizophrenia: Secondary | ICD-10-CM | POA: Diagnosis not present

## 2021-06-29 LAB — LIPID PANEL
Cholesterol: 210 mg/dL — ABNORMAL HIGH (ref 0–200)
HDL: 60 mg/dL (ref >40)
LDL Cholesterol: 135 mg/dL — ABNORMAL HIGH (ref 0–99)
Total CHOL/HDL Ratio: 3.5 RATIO
Triglycerides: 74 mg/dL (ref <150)
VLDL: 15 mg/dL (ref 0–40)

## 2021-06-29 LAB — HEMOGLOBIN A1C
Hgb A1c MFr Bld: 5.3 % (ref 4.8–5.6)
Mean Plasma Glucose: 105.41 mg/dL

## 2021-06-29 NOTE — Group Note (Signed)
[facility] LCSW Group Therapy Note   Group Date: 06/29/2021 Start Time: 1300 End Time: 1400  Type of Therapy and Topic:  Group Therapy:  Feelings around Relapse and Recovery  Participation Level:  Did Not Attend   Mood:  Description of Group:    Patients in this group will discuss emotions they experience before and after a relapse. They will process how experiencing these feelings, or avoidance of experiencing them, relates to having a relapse. Facilitator will guide patients to explore emotions they have related to recovery. Patients will be encouraged to process which emotions are more powerful. They will be guided to discuss the emotional reaction significant others in their lives may have to patients' relapse or recovery. Patients will be assisted in exploring ways to respond to the emotions of others without this contributing to a relapse.  Therapeutic Goals: Patient will identify two or more emotions that lead to relapse for them:  Patient will identify two emotions that result when they relapse:  Patient will identify two emotions related to recovery:  Patient will demonstrate ability to communicate their needs through discussion and/or role plays.   Summary of Patient Progress: Patient given the opportunity to attend group, however, declined.    Therapeutic Modalities:   Cognitive Behavioral Therapy Solution-Focused Therapy Assertiveness Training Relapse Prevention Therapy   Laquenta Whitsell J Kameran Lallier, LCSW 

## 2021-06-29 NOTE — Progress Notes (Signed)
Patient pacing the unit this evening but was pleasant with this Clinical research associate. Patient denies SI/HI/AVH but presents as responding to internal stimuli. Patient observed interacting appropriately with staff and peers on the unit. Patient refused medications tonight, Pt given education but still refused. Pt given education, support, and encouragement to be active in his treatment plan. Pt being monitored Q 15 minutes for safety per unit protocol. Pt remains safe on the unit.

## 2021-06-29 NOTE — Progress Notes (Signed)
Patient went down to the dayroom and ate dinner. He was watching television, when this Clinical research associate approached him for evening medication. He initially shook his head "no" when this Clinical research associate asked him to come up for medication. About three minutes later, patient came through the double doors and was looking at the medication room, so this writer administered medication. Patient tolerated medication well, without any issues. Patient has been in his room and remains safe on the unit.

## 2021-06-29 NOTE — Progress Notes (Signed)
Recreation Therapy Notes  Date: 06/29/2021  Time: 10:00 am    Location: Craft room    Behavioral response: Appropriate  Intervention Topic: Relaxation   Discussion/Intervention:  Group content today was focused on relaxation. The group defined relaxation and identified healthy ways to relax. Individuals expressed how much time they spend relaxing. Patients expressed how much their life would be if they did not make time for themselves to relax. The group stated ways they could improve their relaxation techniques in the future.  Individuals participated in the intervention "Time to Relax" where they had a chance to experience different relaxation techniques.  Clinical Observations/Feedback: Patient came to group and explained that yoga could be a good way to relax. He expressed his desire to go on vacation and relax. Though out group on and off participant was responding to internal stimuli and laughing to himself. Individual was social with staff and peers while participating in the intervention. [name] LRT/CTRS         Lekeshia Kram 06/29/2021 11:29 AM

## 2021-06-29 NOTE — Progress Notes (Signed)
[facility] MD Progress Note  06/29/2021 3:28 PM David Davila.  MRN:  [id number] Subjective: Follow-up for this patient with schizophrenia.  Patient seen today.  Early in the day he was pacing around in the hallways and nodded at me.  Later on spoke to some of the staff members.  This afternoon would make eye contact with me but not speak.  Eventually he did speak to me asking why he had to eat.  It turns out this is what he was speaking with the staff about as well.  He has not eaten anything today and has some sort of psychotic thinking about eating.  A little worrisome except that he is basically in pretty good health.  Taking medicine for the most part.  Presumably taking medicine but I will check a Depakote level tomorrow. Principal Problem: Schizophrenia, paranoid type (HCC) Diagnosis: Principal Problem:   Schizophrenia, paranoid type (HCC) Active Problems:   Noncompliance with medication regimen   TBI (traumatic brain injury)  Total Time spent with patient: 30 minutes  Past Psychiatric History: Past history of recurrent psychosis often very sick when off of medicine.  Previously had pretty good responses to medication  Past Medical History:  Past Medical History:  Diagnosis Date   Acute psychosis (HCC)    Antisocial personality disorder (HCC)    Cannabis abuse    Past   Paranoid schizophrenia (HCC)    Schizoaffective disorder, bipolar type (HCC)    History reviewed. No pertinent surgical history. Family History: History reviewed. No pertinent family history. Family Psychiatric  History: None reported Social History:  Social History   Substance and Sexual Activity  Alcohol Use No     Social History   Substance and Sexual Activity  Drug Use Not Currently    Social History   Socioeconomic History   Marital status: Single    Spouse name: Not on file   Number of children: Not on file   Years of education: Not on file   Highest education level: Not on file   Occupational History   Not on file  Tobacco Use   Smoking status: Every Day    Packs/day: 1.00    Types: Cigarettes   Smokeless tobacco: Never  Vaping Use   Vaping Use: Unknown  Substance and Sexual Activity   Alcohol use: No   Drug use: Not Currently   Sexual activity: Not Currently  Other Topics Concern   Not on file  Social History Narrative   Not on file   Social Determinants of Health   Financial Resource Strain: Not on file  Food Insecurity: Not on file  Transportation Needs: Not on file  Physical Activity: Not on file  Stress: Not on file  Social Connections: Not on file   Additional Social History:                         Sleep: Fair  Appetite:  Poor  Current Medications: Current Facility-Administered Medications  Medication Dose Route Frequency Provider Last Rate Last Admin   acetaminophen (TYLENOL) tablet 650 mg  650 mg Oral Q6H PRN Charm Rings, NP       alum & mag hydroxide-simeth (MAALOX/MYLANTA) 200-200-20 MG/5ML suspension 30 mL  30 mL Oral Q4H PRN Charm Rings, NP       atorvastatin (LIPITOR) tablet 10 mg  10 mg Oral q1800 Deal, Adonis Housekeeper, RPH   10 mg at 06/27/21 2135   benztropine (COGENTIN) tablet 1  mg  1 mg Oral BID Charm Rings, NP   1 mg at 06/29/21 8850   divalproex (DEPAKOTE) DR tablet 1,000 mg  1,000 mg Oral Earl Lites, Herminio Heads, NP   1,000 mg at 06/29/21 0820   haloperidol (HALDOL) tablet 10 mg  10 mg Oral BID Yomar Mejorado, Jackquline Denmark, MD   10 mg at 06/29/21 0820   magnesium hydroxide (MILK OF MAGNESIA) suspension 30 mL  30 mL Oral Daily PRN Charm Rings, NP       mirtazapine (REMERON) tablet 15 mg  15 mg Oral QHS Charm Rings, NP   15 mg at 06/27/21 2135   OLANZapine (ZYPREXA) tablet 10 mg  10 mg Oral BID Charm Rings, NP   10 mg at 06/29/21 2774   traZODone (DESYREL) tablet 100 mg  100 mg Oral QHS PRN Charm Rings, NP   100 mg at 06/27/21 2135    Lab Results:  Results for orders placed or performed during the  hospital encounter of 06/22/21 (from the past 48 hour(s))  Hemoglobin A1c     Status: None   Collection Time: 06/29/21  8:13 AM  Result Value Ref Range   Hgb A1c MFr Bld 5.3 4.8 - 5.6 %    Comment: (NOTE) Pre diabetes:          5.7%-6.4%  Diabetes:              >6.4%  Glycemic control for   <7.0% adults with diabetes    Mean Plasma Glucose 105.41 mg/dL    Comment: Performed at [facility], [address] [place], [place] [address]  Lipid panel     Status: Abnormal   Collection Time: 06/29/21  8:13 AM  Result Value Ref Range   Cholesterol 210 (H) 0 - 200 mg/dL   Triglycerides 74 <676 mg/dL   HDL 60 >72 mg/dL   Total CHOL/HDL Ratio 3.5 RATIO   VLDL 15 0 - 40 mg/dL   LDL Cholesterol 094 (H) 0 - 99 mg/dL    Comment:        Total Cholesterol/HDL:CHD Risk Coronary Heart Disease Risk Table                     Men   Women  1/2 Average Risk   3.4   3.3  Average Risk       5.0   4.4  2 X Average Risk   9.6   7.1  3 X Average Risk  23.4   11.0        Use the calculated Patient Ratio above and the CHD Risk Table to determine the patient's CHD Risk.        ATP III CLASSIFICATION (LDL):  <100     mg/dL   Optimal  709-628  mg/dL   Near or Above                    Optimal  130-159  mg/dL   Borderline  366-294  mg/dL   High  >765     mg/dL   Very High Performed at [facility], [address] [place], [place] [address]     Blood Alcohol level:  Lab Results  Component Value Date   [facility] <10 06/14/2021   ETH <10 04/13/2021    Metabolic Disorder Labs: Lab Results  Component Value Date   HGBA1C 5.3 06/29/2021   MPG 105.41 06/29/2021   MPG 108.28 03/25/2018  No results found for: PROLACTIN Lab Results  Component Value Date   CHOL 210 (H) 06/29/2021   TRIG 74 06/29/2021   HDL 60 06/29/2021   CHOLHDL 3.5 06/29/2021   VLDL 15 06/29/2021   LDLCALC 135 (H) 06/29/2021   LDLCALC 125 (H) 03/25/2018    Physical Findings: AIMS: Facial and Oral  Movements Muscles of Facial Expression: None, normal Lips and Perioral Area: None, normal Jaw: None, normal Tongue: None, normal,Extremity Movements Upper (arms, wrists, hands, fingers): None, normal Lower (legs, knees, ankles, toes): None, normal, Trunk Movements Neck, shoulders, hips: None, normal, Overall Severity Severity of abnormal movements (highest score from questions above): None, normal Incapacitation due to abnormal movements: None, normal Patient's awareness of abnormal movements (rate only patient's report): No Awareness, Dental Status Current problems with teeth and/or dentures?: No Does patient usually wear dentures?: No  CIWA:    COWS:     Musculoskeletal: Strength & Muscle Tone: within normal limits Gait & Station: normal Patient leans: N/A  Psychiatric Specialty Exam:  Presentation  General Appearance: Casual; Neat  Eye Contact:Fleeting  Speech:Garbled  Speech Volume:Decreased  Handedness:Right   Mood and Affect  Mood:Anxious; Irritable  Affect:Constricted   Thought Process  Thought Processes:Disorganized; Irrevelant  Descriptions of Associations:Loose  Orientation:Partial  Thought Content:Paranoid Ideation; Illogical  History of Schizophrenia/Schizoaffective disorder:Yes  Duration of Psychotic Symptoms:Greater than six months  Hallucinations:No data recorded Ideas of Reference:Paranoia  Suicidal Thoughts:No data recorded Homicidal Thoughts:No data recorded  Sensorium  Memory:Immediate Good  Judgment:Impaired  Insight:Poor   Executive Functions  Concentration:Poor  Attention Span:Poor  Recall:Fair  Fund of Knowledge:Fair  Language:Fair   Psychomotor Activity  Psychomotor Activity:No data recorded  Assets  Assets:Physical Health   Sleep  Sleep:No data recorded   Physical Exam: Physical Exam Vitals and nursing note reviewed.  Constitutional:      Appearance: Normal appearance.  HENT:     Head:  Normocephalic and atraumatic.     Mouth/Throat:     Pharynx: Oropharynx is clear.  Eyes:     Pupils: Pupils are equal, round, and reactive to light.  Cardiovascular:     Rate and Rhythm: Normal rate and regular rhythm.  Pulmonary:     Effort: Pulmonary effort is normal.     Breath sounds: Normal breath sounds.  Abdominal:     General: Abdomen is flat.     Palpations: Abdomen is soft.  Musculoskeletal:        General: Normal range of motion.  Skin:    General: Skin is warm and dry.  Neurological:     General: No focal deficit present.     Mental Status: He is alert. Mental status is at baseline.  Psychiatric:        Mood and Affect: Mood normal.        Thought Content: Thought content normal.   ROS Blood pressure 111/66, pulse 84, temperature (!) 97.5 F (36.4 C), temperature source Oral, resp. rate 18, height 5\' 10"  (1.778 m), weight 55.8 kg, SpO2 98 %. Body mass index is 17.65 kg/m.   Treatment Plan Summary: Plan no change to medicine he is already on Depakote and 2 antipsychotics.  Depakote level will be checked tomorrow as 1 way of verifying if he is really swallowing medicine.  If it comes back very low or we have other evidence of cheeking medicine we may have to force injections.  Also may have to take more aggressive steps like that if he continues to starve himself.  Tried to talk  to patient about how he obviously needs to eat normally.  Staff all aware of the situation.  Mordecai Rasmussen, MD 06/29/2021, 3:28 PM

## 2021-06-29 NOTE — Progress Notes (Signed)
This writer assumed care of patient at 1500. Patient has been in his room, asleep, showing no signs of distress. Patient remains safe on the unit.

## 2021-06-29 NOTE — Plan of Care (Signed)
Patient pacing in the hallway talking to himself and laughing. Patient took medicine and commented on Haldol.States " I never took round Haldol. I used to take square ones. I know you want me to take this. I will take."  Denies SI,HI and AVH. Continues to have disorganized talks. Appetite and energy level good. Support and encouragement given.

## 2021-06-29 NOTE — Plan of Care (Signed)
°  Problem: Group Participation °Goal: STG - Patient will engage in groups without prompting or encouragement from LRT x3 group sessions within 5 recreation therapy group sessions °Description: STG - Patient will engage in groups without prompting or encouragement from LRT x3 group sessions within 5 recreation therapy group sessions °Outcome: Progressing °  °

## 2021-06-30 DIAGNOSIS — F200 Paranoid schizophrenia: Secondary | ICD-10-CM | POA: Diagnosis not present

## 2021-06-30 NOTE — Progress Notes (Addendum)
[facility] MD Progress Note  06/30/2021 8:07 AM David Davila.  MRN:  [id number] Subjective:   Patient was initially seen for a psychiatric evaluation on June 23, 2021, deemed to be psychotic with a history of schizophrenia. He [place]C [place] [facility]. Pt is on the following medication: Zyprexa, Haldol, Depakote Remeron Cogentin and trazodone.  Patient was seen walking the halls this morning. He would not speak and was not receptive to come into the office for interview. Unable to ascertain mood or  presence/extent of psychosis.  Per report, he suspiciously peaked into the cafeteria.  He was upset about having the wrong milk on his tray. He placed the tray back. A few minutes later, he said "it is what it is," and then pick up the tray and started eating." This morning he is actively eating the food on his tray.   Staff nursing notes indicate that he is selectively social, interacting in the day area with other patients but not with staff.  Sleeping well, per nursing report.   Depakote level as pending this time. Principal Problem: Schizophrenia, paranoid type (HCC) Diagnosis: Principal Problem:   Schizophrenia, paranoid type (HCC) Active Problems:   Noncompliance with medication regimen   TBI (traumatic brain injury)  Total Time spent with patient: 26 minutes  Past Psychiatric History: Past history of recurrent psychosis often very sick when off of medicine.  Previously had pretty good responses to medication  Past Medical History:  Past Medical History:  Diagnosis Date   Acute psychosis (HCC)    Antisocial personality disorder (HCC)    Cannabis abuse    Past   Paranoid schizophrenia (HCC)    Schizoaffective disorder, bipolar type (HCC)    History reviewed. No pertinent surgical history. Family History: History reviewed. No pertinent family history. Family Psychiatric  History: None reported Social History:  Social History    Substance and Sexual Activity  Alcohol Use No     Social History   Substance and Sexual Activity  Drug Use Not Currently    Social History   Socioeconomic History   Marital status: Single    Spouse name: Not on file   Number of children: Not on file   Years of education: Not on file   Highest education level: Not on file  Occupational History   Not on file  Tobacco Use   Smoking status: Every Day    Packs/day: 1.00    Types: Cigarettes   Smokeless tobacco: Never  Vaping Use   Vaping Use: Unknown  Substance and Sexual Activity   Alcohol use: No   Drug use: Not Currently   Sexual activity: Not Currently  Other Topics Concern   Not on file  Social History Narrative   Not on file   Social Determinants of Health   Financial Resource Strain: Not on file  Food Insecurity: Not on file  Transportation Needs: Not on file  Physical Activity: Not on file  Stress: Not on file  Social Connections: Not on file   Additional Social History:                         Sleep: Fair  Appetite:  Poor  Current Medications: Current Facility-Administered Medications  Medication Dose Route Frequency Provider Last Rate Last Admin   acetaminophen (TYLENOL) tablet 650 mg  650 mg Oral Q6H PRN Charm Rings, NP       alum & mag hydroxide-simeth (  MAALOX/MYLANTA) 200-200-20 MG/5ML suspension 30 mL  30 mL Oral Q4H PRN Charm Rings, NP       atorvastatin (LIPITOR) tablet 10 mg  10 mg Oral q1800 Deal, Adonis Housekeeper, RPH   10 mg at 06/27/21 2135   benztropine (COGENTIN) tablet 1 mg  1 mg Oral BID Charm Rings, NP   1 mg at 06/29/21 1646   divalproex (DEPAKOTE) DR tablet 1,000 mg  1,000 mg Oral Earl Lites, Herminio Heads, NP   1,000 mg at 06/29/21 0820   haloperidol (HALDOL) tablet 10 mg  10 mg Oral BID Clapacs, Jackquline Denmark, MD   10 mg at 06/29/21 1646   magnesium hydroxide (MILK OF MAGNESIA) suspension 30 mL  30 mL Oral Daily PRN Charm Rings, NP       mirtazapine (REMERON) tablet 15  mg  15 mg Oral QHS Charm Rings, NP   15 mg at 06/27/21 2135   OLANZapine (ZYPREXA) tablet 10 mg  10 mg Oral BID Charm Rings, NP   10 mg at 06/29/21 1646   traZODone (DESYREL) tablet 100 mg  100 mg Oral QHS PRN Charm Rings, NP   100 mg at 06/27/21 2135    Lab Results:  Results for orders placed or performed during the hospital encounter of 06/22/21 (from the past 48 hour(s))  Hemoglobin A1c     Status: None   Collection Time: 06/29/21  8:13 AM  Result Value Ref Range   Hgb A1c MFr Bld 5.3 4.8 - 5.6 %    Comment: (NOTE) Pre diabetes:          5.7%-6.4%  Diabetes:              >6.4%  Glycemic control for   <7.0% adults with diabetes    Mean Plasma Glucose 105.41 mg/dL    Comment: Performed at [facility], [address] [place], [place] [address]  Lipid panel     Status: Abnormal   Collection Time: 06/29/21  8:13 AM  Result Value Ref Range   Cholesterol 210 (H) 0 - 200 mg/dL   Triglycerides 74 <229 mg/dL   HDL 60 >79 mg/dL   Total CHOL/HDL Ratio 3.5 RATIO   VLDL 15 0 - 40 mg/dL   LDL Cholesterol 892 (H) 0 - 99 mg/dL    Comment:        Total Cholesterol/HDL:CHD Risk Coronary Heart Disease Risk Table                     Men   Women  1/2 Average Risk   3.4   3.3  Average Risk       5.0   4.4  2 X Average Risk   9.6   7.1  3 X Average Risk  23.4   11.0        Use the calculated Patient Ratio above and the CHD Risk Table to determine the patient's CHD Risk.        ATP III CLASSIFICATION (LDL):  <100     mg/dL   Optimal  119-417  mg/dL   Near or Above                    Optimal  130-159  mg/dL   Borderline  408-144  mg/dL   High  >818     mg/dL   Very High Performed at [facility], [address], [place], [place] [address]  Blood Alcohol level:  Lab Results  Component Value Date   ETH <10 06/14/2021   ETH <10 04/13/2021    Metabolic Disorder Labs: Lab Results  Component Value Date   HGBA1C 5.3 06/29/2021   MPG 105.41  06/29/2021   MPG 108.28 03/25/2018   No results found for: PROLACTIN Lab Results  Component Value Date   CHOL 210 (H) 06/29/2021   TRIG 74 06/29/2021   HDL 60 06/29/2021   CHOLHDL 3.5 06/29/2021   VLDL 15 06/29/2021   LDLCALC 135 (H) 06/29/2021   LDLCALC 125 (H) 03/25/2018    Physical Findings: AIMS: Facial and Oral Movements Muscles of Facial Expression: None, normal Lips and Perioral Area: None, normal Jaw: None, normal Tongue: None, normal,Extremity Movements Upper (arms, wrists, hands, fingers): None, normal Lower (legs, knees, ankles, toes): None, normal, Trunk Movements Neck, shoulders, hips: None, normal, Overall Severity Severity of abnormal movements (highest score from questions above): None, normal Incapacitation due to abnormal movements: None, normal Patient's awareness of abnormal movements (rate only patient's report): No Awareness, Dental Status Current problems with teeth and/or dentures?: No Does patient usually wear dentures?: No  CIWA:    COWS:     Musculoskeletal: Strength & Muscle Tone: within normal limits Gait & Station: normal Patient leans: N/A  Psychiatric Specialty Exam:  Presentation  General Appearance: Casual; Neat  Eye Contact:Fleeting  Speech:non verbal Handedness:Right   Mood and Affect  Mood:Unable to ascertain Affect:Odd  Thought Process  Thought Processes: Unable to ascertain, paucity of speech.  Descriptions of Associations: Orientation:Partial  Thought Content:Paranoid Ideation; Illogical  History of Schizophrenia/Schizoaffective disorder:Yes  Duration of Psychotic Symptoms:Greater than six months  Hallucinations:No data recorded Ideas of Reference:Paranoia  Suicidal Thoughts:No data recorded Homicidal Thoughts:No data recorded  Sensorium  Memory:Immediate Good  Judgment:Impaired  Insight:Poor   Executive Functions  Concentration:Poor  Attention Span:Poor  Recall:Fair  Fund of  Knowledge:Fair  Language:Fair   Psychomotor Activity  Psychomotor Activity:No data recorded  Assets  Assets:Physical Health   Sleep  Sleep:No data recorded   Physical Exam: Physical Exam Vitals and nursing note reviewed.  Constitutional:      Appearance: Normal appearance.  HENT:     Head: Normocephalic and atraumatic.     Mouth/Throat:     Pharynx: Oropharynx is clear.  Eyes:     Pupils: Pupils are equal, round, and reactive to light.  Cardiovascular:     Rate and Rhythm: Normal rate and regular rhythm.  Pulmonary:     Effort: Pulmonary effort is normal.     Breath sounds: Normal breath sounds.  Abdominal:     General: Abdomen is flat.     Palpations: Abdomen is soft.  Musculoskeletal:        General: Normal range of motion.  Skin:    General: Skin is warm and dry.  Neurological:     General: No focal deficit present.     Mental Status: He is alert. Mental status is at baseline.  Psychiatric:        Mood and Affect: Mood normal.        Thought Content: Thought content normal.   ROS Blood pressure 111/66, pulse 84, temperature (!) 97.5 F (36.4 C), temperature source Oral, resp. rate 18, height 5\' 10"  (1.778 m), weight 55.8 kg, SpO2 98 %. Body mass index is 17.65 kg/m.   Treatment Plan Summary: Plan no change to medicine he is already on Depakote and 2 antipsychotics.  Depakote level will be checked tomorrow as 1 way of  verifying if he is really swallowing medicine.  If it comes back very low or we have other evidence of cheeking medicine we may have to force injections.  Also may have to take more aggressive steps like that if he continues to starve himself.  Tried to talk to patient about how he obviously needs to eat normally.  Staff all aware of the situation.  11/19 Depakote level is pending this AM.   Reggie Pile, MD 06/30/2021, 8:07 AM

## 2021-06-30 NOTE — Progress Notes (Signed)
Patient alert and oriented x 3 with periods of confusion to situation, he's interacting with peers in the community room, he denies SI/HI/AVH, minimal interaction with staff. Patient refused scheduled evening medication he stated to writer "NO"  no distress noted, 15 minutes safety checks maintained will continue to monitor.

## 2021-06-30 NOTE — Plan of Care (Signed)
Patient continues to pace and has been refusing his medications. Guarded and preoccupied. Has no aggressive behaviors.

## 2021-06-30 NOTE — BH IP Treatment Plan (Signed)
Interdisciplinary Treatment and Diagnostic Plan Update  06/30/2021 Time of Session: 11:30AM David Davila. MRN: [id number]  Principal Diagnosis: Schizophrenia, paranoid type (HCC)  Secondary Diagnoses: Principal Problem:   Schizophrenia, paranoid type (HCC) Active Problems:   Noncompliance with medication regimen   TBI (traumatic brain injury)   Current Medications:  Current Facility-Administered Medications  Medication Dose Route Frequency Provider Last Rate Last Admin   acetaminophen (TYLENOL) tablet 650 mg  650 mg Oral Q6H PRN Charm Rings, NP       alum & mag hydroxide-simeth (MAALOX/MYLANTA) 200-200-20 MG/5ML suspension 30 mL  30 mL Oral Q4H PRN Charm Rings, NP       atorvastatin (LIPITOR) tablet 10 mg  10 mg Oral q1800 Deal, Adonis Housekeeper, RPH   10 mg at 06/27/21 2135   benztropine (COGENTIN) tablet 1 mg  1 mg Oral BID Charm Rings, NP   1 mg at 06/29/21 1646   divalproex (DEPAKOTE) DR tablet 1,000 mg  1,000 mg Oral BH-q7a Lord, Herminio Heads, NP   1,000 mg at 06/29/21 0820   haloperidol (HALDOL) tablet 10 mg  10 mg Oral BID Clapacs, Jackquline Denmark, MD   10 mg at 06/29/21 1646   magnesium hydroxide (MILK OF MAGNESIA) suspension 30 mL  30 mL Oral Daily PRN Charm Rings, NP       mirtazapine (REMERON) tablet 15 mg  15 mg Oral QHS Charm Rings, NP   15 mg at 06/27/21 2135   OLANZapine (ZYPREXA) tablet 10 mg  10 mg Oral BID Charm Rings, NP   10 mg at 06/29/21 1646   traZODone (DESYREL) tablet 100 mg  100 mg Oral QHS PRN Charm Rings, NP   100 mg at 06/27/21 2135   PTA Medications: Medications Prior to Admission  Medication Sig Dispense Refill Last Dose   atorvastatin (LIPITOR) 10 MG tablet Take 1 tablet (10 mg total) by mouth daily at 6 PM. 30 tablet 0    benztropine (COGENTIN) 1 MG tablet Take 1 tablet (1 mg total) by mouth 2 (two) times daily. 60 tablet 0    divalproex (DEPAKOTE) 500 MG DR tablet Take 2 tablets (1,000 mg total) by mouth every morning. 60  tablet 0    haloperidol (HALDOL) 5 MG tablet Take 1 tablet (5 mg total) by mouth at bedtime. 30 tablet 0    mirtazapine (REMERON) 15 MG tablet Take 1 tablet (15 mg total) by mouth at bedtime. 30 tablet 0    OLANZapine (ZYPREXA) 10 MG tablet Take 1 tablet (10 mg total) by mouth 2 (two) times daily. 60 tablet 0    paliperidone (INVEGA SUSTENNA) 156 MG/ML SUSY injection Inject 1 mL (156 mg total) into the muscle once for 1 dose. (Patient not taking: Reported on 02/06/2021) 1 mL 0    paliperidone (INVEGA SUSTENNA) 234 MG/1.5ML SUSY injection Inject 234 mg into the muscle once for 1 dose. Next injection due 05/27/18 (Patient not taking: Reported on 02/06/2021) 1.5 mL 0    paliperidone (INVEGA) 6 MG 24 hr tablet Take 1 tablet (6 mg total) by mouth daily. (Patient not taking: No sig reported) 30 tablet 0    traZODone (DESYREL) 100 MG tablet Take 1 tablet (100 mg total) by mouth at bedtime as needed for sleep. (Patient not taking: Reported on 06/14/2021) 30 tablet 0     Patient Stressors: Financial difficulties   Medication change or noncompliance    Patient Strengths: Motivation for treatment/growth   Treatment Modalities:  Medication Management, Group therapy, Case management,  1 to 1 session with clinician, Psychoeducation, Recreational therapy.   Physician Treatment Plan for Primary Diagnosis: Schizophrenia, paranoid type (HCC) Long Term Goal(s): Improvement in symptoms so as ready for discharge   Short Term Goals: Ability to demonstrate self-control will improve Ability to maintain clinical measurements within normal limits will improve Compliance with prescribed medications will improve Ability to disclose and discuss suicidal ideas Ability to identify and develop effective coping behaviors will improve  Medication Management: Evaluate patient's response, side effects, and tolerance of medication regimen.  Therapeutic Interventions: 1 to 1 sessions, Unit Group sessions and Medication  administration.  Evaluation of Outcomes: Not Progressing  Physician Treatment Plan for Secondary Diagnosis: Principal Problem:   Schizophrenia, paranoid type (HCC) Active Problems:   Noncompliance with medication regimen   TBI (traumatic brain injury)  Long Term Goal(s): Improvement in symptoms so as ready for discharge   Short Term Goals: Ability to demonstrate self-control will improve Ability to maintain clinical measurements within normal limits will improve Compliance with prescribed medications will improve Ability to disclose and discuss suicidal ideas Ability to identify and develop effective coping behaviors will improve     Medication Management: Evaluate patient's response, side effects, and tolerance of medication regimen.  Therapeutic Interventions: 1 to 1 sessions, Unit Group sessions and Medication administration.  Evaluation of Outcomes: Not Progressing   RN Treatment Plan for Primary Diagnosis: Schizophrenia, paranoid type (HCC) Long Term Goal(s): Knowledge of disease and therapeutic regimen to maintain health will improve  Short Term Goals: Ability to remain free from injury will improve, Ability to verbalize frustration and anger appropriately will improve, Ability to demonstrate self-control, Ability to participate in decision making will improve, Ability to verbalize feelings will improve, Ability to disclose and discuss suicidal ideas, Ability to identify and develop effective coping behaviors will improve, and Compliance with prescribed medications will improve  Medication Management: RN will administer medications as ordered by provider, will assess and evaluate patient's response and provide education to patient for prescribed medication. RN will report any adverse and/or side effects to prescribing provider.  Therapeutic Interventions: 1 on 1 counseling sessions, Psychoeducation, Medication administration, Evaluate responses to treatment, Monitor vital signs  and CBGs as ordered, Perform/monitor CIWA, COWS, AIMS and Fall Risk screenings as ordered, Perform wound care treatments as ordered.  Evaluation of Outcomes: Not Progressing   LCSW Treatment Plan for Primary Diagnosis: Schizophrenia, paranoid type (HCC) Long Term Goal(s): Safe transition to appropriate next level of care at discharge, Engage patient in therapeutic group addressing interpersonal concerns.  Short Term Goals: Engage patient in aftercare planning with referrals and resources, Increase social support, Increase ability to appropriately verbalize feelings, Increase emotional regulation, Facilitate acceptance of mental health diagnosis and concerns, and Increase skills for wellness and recovery  Therapeutic Interventions: Assess for all discharge needs, 1 to 1 time with Social worker, Explore available resources and support systems, Assess for adequacy in community support network, Educate family and significant other(s) on suicide prevention, Complete Psychosocial Assessment, Interpersonal group therapy.  Evaluation of Outcomes: Not Progressing   Progress in Treatment: Attending groups: No. Participating in groups: No. Taking medication as prescribed: No. Toleration medication: Yes. Family/Significant other contact made: Yes, individual(s) contacted:  Patient's guardian contacted. Patient understands diagnosis: No. Discussing patient identified problems/goals with staff: No. Medical problems stabilized or resolved: Yes. Denies suicidal/homicidal ideation: Yes. Issues/concerns per patient self-inventory: No. Other: none.  New problem(s) identified: No, Describe:  none.   New Short Term/Long Term  Goal(s): elimination of symptoms of psychosis, medication management for mood stabilization; elimination of SI thoughts; development of comprehensive mental wellness/sobriety plan. Update 06/30/2021: No changes at this time.   Patient Goals: "I don't know what they brought me to the  hospital for." Update 06/30/2021: No changes at this time.   Discharge Plan or Barriers: CSW will assist pt/guardian with development of an appropriate aftercare/discharge plan.06/30/2021: No changes at this time.     Reason for Continuation of Hospitalization: Delusions  Medication stabilization  Estimated Length of Stay: TBD   Scribe for Treatment Team: Marletta Lor 06/30/2021 11:32 AM

## 2021-06-30 NOTE — Progress Notes (Signed)
Patient  presented to the nurses station and reported that he will take medications if I give him ensure. Patient received ensure and accepted medications. Patient was experiencing disorganized thinking and was hypersexual. He continues to pace but has no aggressive behaviors.

## 2021-06-30 NOTE — Group Note (Signed)
LCSW Group Therapy Note  Group Date: 06/30/2021 Start Time: 1315 End Time: 1405   Type of Therapy and Topic:  Group Therapy - Healthy vs Unhealthy Coping Skills  Participation Level:  None   Description of Group The focus of this group was to determine what unhealthy coping techniques typically are used by group members and what healthy coping techniques would be helpful in coping with various problems. Patients were guided in becoming aware of the differences between healthy and unhealthy coping techniques. Patients were asked to identify 2-3 healthy coping skills they would like to learn to use more effectively.  Therapeutic Goals Patients learned that coping is what human beings do all day long to deal with various situations in their lives Patients defined and discussed healthy vs unhealthy coping techniques Patients identified their preferred coping techniques and identified whether these were healthy or unhealthy Patients determined 2-3 healthy coping skills they would like to become more familiar with and use more often. Patients provided support and ideas to each other   Summary of Patient Progress: Patient presented to the last 5 minutes of group. When invited to participate, patient stated that he is "just listening."   Therapeutic Modalities Cognitive Behavioral Therapy Motivational Interviewing  Norberto Sorenson, Theresia Majors 06/30/2021  5:47 PM

## 2021-07-01 DIAGNOSIS — F200 Paranoid schizophrenia: Secondary | ICD-10-CM | POA: Diagnosis not present

## 2021-07-01 NOTE — Group Note (Signed)
LCSW Group Therapy Note  Group Date: 07/01/2021 Start Time: 1315 End Time: 1405   Type of Therapy and Topic:  Group Therapy - How To Cope with Nervousness about Discharge   Participation Level:  Did Not Attend   Description of Group This process group involved identification of patients' feelings about discharge. Some of them are scheduled to be discharged soon, while others are new admissions, but each of them was asked to share thoughts and feelings surrounding discharge from the hospital. One common theme was that they are excited at the prospect of going home, while another was that many of them are apprehensive about sharing why they were hospitalized. Patients were given the opportunity to discuss these feelings with their peers in preparation for discharge.  Therapeutic Goals  Patient will identify their overall feelings about pending discharge. Patient will think about how they might proactively address issues that they believe will once again arise once they get home (i.e. with parents). Patients will participate in discussion about having hope for change.   Summary of Patient Progress: Patient did not attend group despite encouraged participation.   Therapeutic Modalities Cognitive Behavioral Therapy   Marletta Lor 07/01/2021  2:41 PM

## 2021-07-01 NOTE — Progress Notes (Signed)
Patient alert and oriented x 4 he's interacting with peers in the day room appropriately, he denies SI/HI/AVH, he refused to take scheduled evening medication he stated to Clinical research associate " I already took my medicine" writer advised him it was nighttime medication but he refused and unwillingly to cooperate. No distress noted, 15 minutes safety checks maintained will continue to monitor.

## 2021-07-01 NOTE — Plan of Care (Signed)
See nursing note for details  Problem: Activity: Goal: Will verbalize the importance of balancing activity with adequate rest periods Outcome: Progressing   Problem: Education: Goal: Will be free of psychotic symptoms Outcome: Not Progressing Goal: Knowledge of the prescribed therapeutic regimen will improve Outcome: Progressing   Problem: Coping: Goal: Coping ability will improve Outcome: Progressing Goal: Will verbalize feelings Outcome: Progressing   Problem: Health Behavior/Discharge Planning: Goal: Compliance with prescribed medication regimen will improve Outcome: Progressing   Problem: Role Relationship: Goal: Ability to communicate needs accurately will improve Outcome: Progressing Goal: Ability to interact with others will improve Outcome: Progressing   Problem: Safety: Goal: Ability to redirect hostility and anger into socially appropriate behaviors will improve Outcome: Progressing Goal: Ability to remain free from injury will improve Outcome: Progressing   Problem: Self-Care: Goal: Ability to participate in self-care as condition permits will improve Outcome: Progressing   Problem: Self-Concept: Goal: Will verbalize positive feelings about self Outcome: Progressing   Problem: Consults Goal: Concurrent Medical Patient Education Description: (See Patient Education Module for education specifics) Outcome: Progressing   Problem: B[facility] Concurrent Medical Problem Goal: LTG-Pt will be physically stable and he/significant other Description: (Patient will be physically stable and he/significant other will be able to verbalize understanding of follow-up care and symptoms that would warrant further treatment) Outcome: Progressing Goal: STG-Vital signs will be within defined limits or stabilized Description: (STG- Vital signs will be within defined limits or stabilized for individual) Outcome: Progressing Goal: STG-Compliance with medication and/or treatment as  ordered Description: (STG-Compliance with medication and/or treatment as ordered by MD) Outcome: Progressing Goal: STG-Verbalize two symptoms that would warrant further Description: (STG-Verbalize two symptoms that would warrant further treatment) Outcome: Progressing Goal: STG-Patient will participate in management/stabilization Description: (STG-Patient will participate in management/stabilization of medical condition) Outcome: Progressing Goal: STG-Other (Specify): Description: STG-Other Concurrent Medical (Specify): Outcome: Progressing

## 2021-07-01 NOTE — Progress Notes (Signed)
[facility] MD Progress Note  07/01/2021 8:29 AM David Davila.  MRN:  [id number] Subjective:  11/20 Patient refused some of his medications yesterday, but did take one dose of Haldol 10 mg as well as Cogentin 1 mg. He did take both dosages of Zyprexa 10 mg. Also refused his Depakote yesterday. Patient was seen watching television in the cafeteria today. "I don't wanna talk to you." Refuses to answer any questions today. Per chart, patient told the nurse that he would take some medications if he could get Ensure. He was described to be hypersexual but the details of this is not charted.  No aggressive behaviors. Yesterday afternoon, he was responding to internal stimuli as   11/19 Patient was initially seen for a psychiatric evaluation on June 23, 2021, deemed to be psychotic with a history of schizophrenia. He was under [facility]C from [facility] [place] the [place] [facility]. Pt is on the following medication: Zyprexa, Haldol, Depakote Remeron Cogentin and trazodone.  Patient was seen walking the halls this morning. He would not speak and was not receptive to come into the office for interview. Unable to ascertain mood or  presence/extent of psychosis.  Per report, he suspiciously peaked into the cafeteria.  He was upset about having the wrong milk on his tray. He placed the tray back. A few minutes later, he said "it is what it is," and then pick up the tray and started eating." This morning he is actively eating the food on his tray.   Staff nursing notes indicate that he is selectively social, interacting in the day area with other patients but not with staff.  Sleeping well, per nursing report.   Depakote level as pending this time. Principal Problem: Schizophrenia, paranoid type (HCC) Diagnosis: Principal Problem:   Schizophrenia, paranoid type (HCC) Active Problems:   Noncompliance with medication regimen   TBI (traumatic brain injury)  Total Time spent with patient: 26  minutes  Past Psychiatric History: Past history of recurrent psychosis often very sick when off of medicine.  Previously had pretty good responses to medication  Past Medical History:  Past Medical History:  Diagnosis Date   Acute psychosis (HCC)    Antisocial personality disorder (HCC)    Cannabis abuse    Past   Paranoid schizophrenia (HCC)    Schizoaffective disorder, bipolar type (HCC)    History reviewed. No pertinent surgical history. Family History: History reviewed. No pertinent family history. Family Psychiatric  History: None reported Social History:  Social History   Substance and Sexual Activity  Alcohol Use No     Social History   Substance and Sexual Activity  Drug Use Not Currently    Social History   Socioeconomic History   Marital status: Single    Spouse name: Not on file   Number of children: Not on file   Years of education: Not on file   Highest education level: Not on file  Occupational History   Not on file  Tobacco Use   Smoking status: Every Day    Packs/day: 1.00    Types: Cigarettes   Smokeless tobacco: Never  Vaping Use   Vaping Use: Unknown  Substance and Sexual Activity   Alcohol use: No   Drug use: Not Currently   Sexual activity: Not Currently  Other Topics Concern   Not on file  Social History Narrative   Not on file   Social Determinants of Health   Financial Resource Strain: Not on file  Food Insecurity:  Not on file  Transportation Needs: Not on file  Physical Activity: Not on file  Stress: Not on file  Social Connections: Not on file   Additional Social History:                         Sleep: Fair  Appetite:  Poor  Current Medications: Current Facility-Administered Medications  Medication Dose Route Frequency Provider Last Rate Last Admin   acetaminophen (TYLENOL) tablet 650 mg  650 mg Oral Q6H PRN Charm Rings, NP       alum & mag hydroxide-simeth (MAALOX/MYLANTA) 200-200-20 MG/5ML suspension 30  mL  30 mL Oral Q4H PRN Charm Rings, NP       atorvastatin (LIPITOR) tablet 10 mg  10 mg Oral q1800 Deal, Adonis Housekeeper, RPH   10 mg at 06/27/21 2135   benztropine (COGENTIN) tablet 1 mg  1 mg Oral BID Charm Rings, NP   1 mg at 06/30/21 1644   divalproex (DEPAKOTE) DR tablet 1,000 mg  1,000 mg Oral BH-q7a Lord, Jamison Y, NP   1,000 mg at 06/29/21 0820   haloperidol (HALDOL) tablet 10 mg  10 mg Oral BID Clapacs, John T, MD   10 mg at 06/30/21 1644   magnesium hydroxide (MILK OF MAGNESIA) suspension 30 mL  30 mL Oral Daily PRN Charm Rings, NP       mirtazapine (REMERON) tablet 15 mg  15 mg Oral QHS Charm Rings, NP   15 mg at 06/27/21 2135   OLANZapine (ZYPREXA) tablet 10 mg  10 mg Oral BID Charm Rings, NP   10 mg at 06/30/21 1644   traZODone (DESYREL) tablet 100 mg  100 mg Oral QHS PRN Charm Rings, NP   100 mg at 06/27/21 2135    Lab Results:  No results found for this or any previous visit (from the past 48 hour(s)).   Blood Alcohol level:  Lab Results  Component Value Date   ETH <10 06/14/2021   ETH <10 04/13/2021    Metabolic Disorder Labs: Lab Results  Component Value Date   HGBA1C 5.3 06/29/2021   MPG 105.41 06/29/2021   MPG 108.28 03/25/2018   No results found for: PROLACTIN Lab Results  Component Value Date   CHOL 210 (H) 06/29/2021   TRIG 74 06/29/2021   HDL 60 06/29/2021   CHOLHDL 3.5 06/29/2021   VLDL 15 06/29/2021   LDLCALC 135 (H) 06/29/2021   LDLCALC 125 (H) 03/25/2018    Physical Findings: AIMS: Facial and Oral Movements Muscles of Facial Expression: None, normal Lips and Perioral Area: None, normal Jaw: None, normal Tongue: None, normal,Extremity Movements Upper (arms, wrists, hands, fingers): None, normal Lower (legs, knees, ankles, toes): None, normal, Trunk Movements Neck, shoulders, hips: None, normal, Overall Severity Severity of abnormal movements (highest score from questions above): None, normal Incapacitation due to abnormal  movements: None, normal Patient's awareness of abnormal movements (rate only patient's report): No Awareness, Dental Status Current problems with teeth and/or dentures?: No Does patient usually wear dentures?: No  CIWA:    COWS:     Musculoskeletal: Strength & Muscle Tone: within normal limits Gait & Station: normal Patient leans: N/A  Psychiatric Specialty Exam:  Presentation  General Appearance: poor grooming  Eye Contact:none Speech:non verbal Handedness:Right   Mood and Affect  Mood:Unable to ascertain Affect:Odd  Thought Process  Thought Processes: Unable to ascertain, paucity of speech.  Descriptions of Associations: Orientation:Partial  Thought  Content:Paranoid Ideation; Illogical  History of Schizophrenia/Schizoaffective disorder:Yes  Duration of Psychotic Symptoms:Greater than six months  Hallucinations:No data recorded Ideas of Reference:Paranoia  Suicidal Thoughts:No data recorded Homicidal Thoughts:No data recorded  Sensorium  Memory:Immediate Good  Judgment:Impaired  Insight:Poor   Executive Functions  Concentration:Poor  Attention Span:Poor  Recall:Fair  Fund of Knowledge:Fair  Language:Fair   Psychomotor Activity  Psychomotor Activity:No data recorded  Assets  Assets:Physical Health   Sleep  Sleep:No data recorded   Physical Exam: Physical Exam Vitals and nursing note reviewed.  Constitutional:      Appearance: Normal appearance.  HENT:     Head: Normocephalic and atraumatic.     Mouth/Throat:     Pharynx: Oropharynx is clear.  Eyes:     Pupils: Pupils are equal, round, and reactive to light.  Cardiovascular:     Rate and Rhythm: Normal rate and regular rhythm.  Pulmonary:     Effort: Pulmonary effort is normal.     Breath sounds: Normal breath sounds.  Abdominal:     General: Abdomen is flat.     Palpations: Abdomen is soft.  Musculoskeletal:        General: Normal range of motion.  Skin:    General: Skin  is warm and dry.  Neurological:     General: No focal deficit present.     Mental Status: He is alert. Mental status is at baseline.  Psychiatric:        Mood and Affect: Mood normal.        Thought Content: Thought content normal.   ROS Blood pressure 111/66, pulse 84, temperature (!) 97.5 F (36.4 C), temperature source Oral, resp. rate 18, height 5\' 10"  (1.778 m), weight 55.8 kg, SpO2 98 %. Body mass index is 17.65 kg/m.   Treatment Plan Summary: Plan no change to medicine he is already on Depakote and 2 antipsychotics.  Depakote level will be checked tomorrow as 1 way of verifying if he is really swallowing medicine.  If it comes back very low or we have other evidence of cheeking medicine we may have to force injections.  Also may have to take more aggressive steps like that if he continues to starve himself.  Tried to talk to patient about how he obviously needs to eat normally.  Staff all aware of the situation.  11/19 Depakote level is pending this AM.   11/20 Depakote level has still not resulted. I called the lab and they are unsure if it was drawn. They did not chart if he refused. I placed a stat level this morning. Asked them to chart if he refuses this AM. Spoke with the nurse about this as well.    CPT: 12/20  [id number], MD 07/01/2021, 8:29 AM

## 2021-07-01 NOTE — Progress Notes (Addendum)
Patient requested medications and informed staff he wanted to eat lunch. Patient ate meal and medication given to patient without difficulty. Patient able to verbalize prescribed medication and is  pleasant during interaction. Denies SI/HI/AH/VH, pain, anxiety and depression. However is observed responding to internal stimuli. Will cont to monitor for safety. David Davila

## 2021-07-01 NOTE — Progress Notes (Signed)
Pt refused lab work despite encouragement and education provided regarding why MD requests pt's lab results. Pt was dismissive, states, "No. It's doesn't matter. I'm not going to give my blood." Then pt turned around while laying in bed then faced away from writer towards the wall, and pulled covers over his head.

## 2021-07-02 DIAGNOSIS — F200 Paranoid schizophrenia: Secondary | ICD-10-CM | POA: Diagnosis not present

## 2021-07-02 NOTE — Group Note (Signed)
[facility] LCSW Group Therapy Note    Group Date: 07/02/2021 Start Time: 1310 End Time: 1400  Type of Therapy and Topic:  Group Therapy:  Overcoming Obstacles  Participation Level:  [facility] PARTICIPATION LEVEL: None  Mood:  Description of Group:   In this group patients will be encouraged to explore what they see as obstacles to their own wellness and recovery. They will be guided to discuss their thoughts, feelings, and behaviors related to these obstacles. The group will process together ways to cope with barriers, with attention given to specific choices patients can make. Each patient will be challenged to identify changes they are motivated to make in order to overcome their obstacles. This group will be process-oriented, with patients participating in exploration of their own experiences as well as giving and receiving support and challenge from other group members.  Therapeutic Goals: 1. Patient will identify personal and current obstacles as they relate to admission. 2. Patient will identify barriers that currently interfere with their wellness or overcoming obstacles.  3. Patient will identify feelings, thought process and behaviors related to these barriers. 4. Patient will identify two changes they are willing to make to overcome these obstacles:    Summary of Patient Progress Patient was present for the beginning of group but walked out half way through. He did not speak even when questions were directed towards him.    Therapeutic Modalities:   Cognitive Behavioral Therapy Solution Focused Therapy Motivational Interviewing Relapse Prevention Therapy   Glenis Smoker, LCSW

## 2021-07-02 NOTE — Progress Notes (Signed)
[facility] MD Progress Note  07/02/2021 1:04 PM [name]  MRN:  [id number] Subjective: Follow-up for this 40 year old man with schizophrenia.  Patient was willing to sit down and talk with me today.  This was an improvement over his paranoid behavior last week.  He is still disorganized.  He asked me if I can explain why he is in the hospital and once again I did so in very simple terms emphasizing the parts that I did not have any information about.  Rather than getting angry as he had previously he seemed to accept this and was able to talk a little about his recent behavior although his insight and understanding is still lacking.  Denies suicidal thoughts.  Still has some evidence of having hallucinations. Principal Problem: Schizophrenia, paranoid type (HCC) Diagnosis: Principal Problem:   Schizophrenia, paranoid type (HCC) Active Problems:   Noncompliance with medication regimen   TBI (traumatic brain injury)  Total Time spent with patient: 30 minutes  Past Psychiatric History: Past history of schizophrenia  Past Medical History:  Past Medical History:  Diagnosis Date   Acute psychosis (HCC)    Antisocial personality disorder (HCC)    Cannabis abuse    Past   Paranoid schizophrenia (HCC)    Schizoaffective disorder, bipolar type (HCC)    History reviewed. No pertinent surgical history. Family History: History reviewed. No pertinent family history. Family Psychiatric  History: See previous Social History:  Social History   Substance and Sexual Activity  Alcohol Use No     Social History   Substance and Sexual Activity  Drug Use Not Currently    Social History   Socioeconomic History   Marital status: Single    Spouse name: Not on file   Number of children: Not on file   Years of education: Not on file   Highest education level: Not on file  Occupational History   Not on file  Tobacco Use   Smoking status: Every Day    Packs/day: 1.00    Types:  Cigarettes   Smokeless tobacco: Never  Vaping Use   Vaping Use: Unknown  Substance and Sexual Activity   Alcohol use: No   Drug use: Not Currently   Sexual activity: Not Currently  Other Topics Concern   Not on file  Social History Narrative   Not on file   Social Determinants of Health   Financial Resource Strain: Not on file  Food Insecurity: Not on file  Transportation Needs: Not on file  Physical Activity: Not on file  Stress: Not on file  Social Connections: Not on file   Additional Social History:                         Sleep: Fair  Appetite:  Fair  Current Medications: Current Facility-Administered Medications  Medication Dose Route Frequency Provider Last Rate Last Admin   acetaminophen (TYLENOL) tablet 650 mg  650 mg Oral Q6H PRN Charm Rings, NP       alum & mag hydroxide-simeth (MAALOX/MYLANTA) 200-200-20 MG/5ML suspension 30 mL  30 mL Oral Q4H PRN Charm Rings, NP       atorvastatin (LIPITOR) tablet 10 mg  10 mg Oral q1800 Deal, Adonis Housekeeper, RPH   10 mg at 06/27/21 2135   benztropine (COGENTIN) tablet 1 mg  1 mg Oral BID Charm Rings, NP   1 mg at 07/02/21 0834   divalproex (DEPAKOTE) DR tablet 1,000 mg  1,000 mg Oral Earl Lites, Herminio Heads, NP   1,000 mg at 07/02/21 7026   haloperidol (HALDOL) tablet 10 mg  10 mg Oral BID Nohelani Benning, Jackquline Denmark, MD   10 mg at 07/02/21 0834   magnesium hydroxide (MILK OF MAGNESIA) suspension 30 mL  30 mL Oral Daily PRN Charm Rings, NP       mirtazapine (REMERON) tablet 15 mg  15 mg Oral QHS Charm Rings, NP   15 mg at 06/27/21 2135   OLANZapine (ZYPREXA) tablet 10 mg  10 mg Oral BID Charm Rings, NP   10 mg at 07/02/21 3785   traZODone (DESYREL) tablet 100 mg  100 mg Oral QHS PRN Charm Rings, NP   100 mg at 06/27/21 2135    Lab Results: No results found for this or any previous visit (from the past 48 hour(s)).  Blood Alcohol level:  Lab Results  Component Value Date   ETH <10 06/14/2021   ETH <10  04/13/2021    Metabolic Disorder Labs: Lab Results  Component Value Date   HGBA1C 5.3 06/29/2021   MPG 105.41 06/29/2021   MPG 108.28 03/25/2018   No results found for: PROLACTIN Lab Results  Component Value Date   CHOL 210 (H) 06/29/2021   TRIG 74 06/29/2021   HDL 60 06/29/2021   CHOLHDL 3.5 06/29/2021   VLDL 15 06/29/2021   LDLCALC 135 (H) 06/29/2021   LDLCALC 125 (H) 03/25/2018    Physical Findings: AIMS: Facial and Oral Movements Muscles of Facial Expression: None, normal Lips and Perioral Area: None, normal Jaw: None, normal Tongue: None, normal,Extremity Movements Upper (arms, wrists, hands, fingers): None, normal Lower (legs, knees, ankles, toes): None, normal, Trunk Movements Neck, shoulders, hips: None, normal, Overall Severity Severity of abnormal movements (highest score from questions above): None, normal Incapacitation due to abnormal movements: None, normal Patient's awareness of abnormal movements (rate only patient's report): No Awareness, Dental Status Current problems with teeth and/or dentures?: No Does patient usually wear dentures?: No  CIWA:    COWS:     Musculoskeletal: Strength & Muscle Tone: within normal limits Gait & Station: normal Patient leans: N/A  Psychiatric Specialty Exam:  Presentation  General Appearance: Casual; Neat  Eye Contact:Fleeting  Speech:Garbled  Speech Volume:Decreased  Handedness:Right   Mood and Affect  Mood:Anxious; Irritable  Affect:Constricted   Thought Process  Thought Processes:Disorganized; Irrevelant  Descriptions of Associations:Loose  Orientation:Partial  Thought Content:Paranoid Ideation; Illogical  History of Schizophrenia/Schizoaffective disorder:Yes  Duration of Psychotic Symptoms:Greater than six months  Hallucinations:No data recorded Ideas of Reference:Paranoia  Suicidal Thoughts:No data recorded Homicidal Thoughts:No data recorded  Sensorium  Memory:Immediate  Good  Judgment:Impaired  Insight:Poor   Executive Functions  Concentration:Poor  Attention Span:Poor  Recall:Fair  Fund of Knowledge:Fair  Language:Fair   Psychomotor Activity  Psychomotor Activity:No data recorded  Assets  Assets:Physical Health   Sleep  Sleep:No data recorded   Physical Exam: Physical Exam Vitals and nursing note reviewed.  Constitutional:      Appearance: Normal appearance.  HENT:     Head: Normocephalic and atraumatic.     Mouth/Throat:     Pharynx: Oropharynx is clear.  Eyes:     Pupils: Pupils are equal, round, and reactive to light.  Cardiovascular:     Rate and Rhythm: Normal rate and regular rhythm.  Pulmonary:     Effort: Pulmonary effort is normal.     Breath sounds: Normal breath sounds.  Abdominal:     General: Abdomen  is flat.     Palpations: Abdomen is soft.  Musculoskeletal:        General: Normal range of motion.  Skin:    General: Skin is warm and dry.  Neurological:     General: No focal deficit present.     Mental Status: He is alert. Mental status is at baseline.  Psychiatric:        Attention and Perception: He is inattentive.        Mood and Affect: Mood normal. Affect is blunt.        Speech: Speech is tangential.        Behavior: Behavior is cooperative.        Thought Content: Thought content is delusional.        Cognition and Memory: Cognition is impaired.   Review of Systems  Constitutional: Negative.   HENT: Negative.    Eyes: Negative.   Respiratory: Negative.    Cardiovascular: Negative.   Gastrointestinal: Negative.   Musculoskeletal: Negative.   Skin: Negative.   Neurological: Negative.   Psychiatric/Behavioral:  Positive for hallucinations. Negative for depression, substance abuse and suicidal ideas. The patient is nervous/anxious.   Blood pressure 111/66, pulse 84, temperature (!) 97.5 F (36.4 C), temperature source Oral, resp. rate 18, height 5\' 10"  (1.778 m), weight 55.8 kg, SpO2 98 %.  Body mass index is 17.65 kg/m.   Treatment Plan Summary: Medication management and Plan 1 concern I have is that he continues to talk about how he "does not believe in eating".  He is not fasting completely but still eats a little bit irregularly.  This seems to be due to his psychosis.  Gently tried to encourage him to make sure he was still eating well.  No change to current medication which is I would hope at an appropriate level and showing some signs of improvement.  Treatment team will continue to monitor daily for improvement although I am not sure he will ever reach a point where he really understands his probation agreement.  , MD 07/02/2021, 1:04 PM

## 2021-07-02 NOTE — Plan of Care (Signed)
See nursing notes for details. Problem: Activity: Goal: Will verbalize the importance of balancing activity with adequate rest periods Outcome: Not Progressing   Problem: Education: Goal: Will be free of psychotic symptoms Outcome: Not Progressing Goal: Knowledge of the prescribed therapeutic regimen will improve Outcome: Not Progressing   Problem: Coping: Goal: Coping ability will improve Outcome: Not Progressing Goal: Will verbalize feelings Outcome: Progressing   Problem: Health Behavior/Discharge Planning: Goal: Compliance with prescribed medication regimen will improve Outcome: Not Progressing   Problem: Nutritional: Goal: Ability to achieve adequate nutritional intake will improve Outcome: Progressing   Problem: Role Relationship: Goal: Ability to communicate needs accurately will improve Outcome: Progressing Goal: Ability to interact with others will improve Outcome: Progressing   Problem: Safety: Goal: Ability to redirect hostility and anger into socially appropriate behaviors will improve Outcome: Not Progressing Goal: Ability to remain free from injury will improve Outcome: Progressing   Problem: Consults Goal: Concurrent Medical Patient Education Description: (See Patient Education Module for education specifics) Outcome: Not Progressing

## 2021-07-02 NOTE — Plan of Care (Signed)
  Problem: Activity: Goal: Will verbalize the importance of balancing activity with adequate rest periods Outcome: Not Progressing   Problem: Education: Goal: Knowledge of the prescribed therapeutic regimen will improve Outcome: Not Progressing   Problem: Coping: Goal: Will verbalize feelings Outcome: Not Progressing   Problem: Nutritional: Goal: Ability to achieve adequate nutritional intake will improve Outcome: Progressing   Problem: Role Relationship: Goal: Ability to communicate needs accurately will improve Outcome: Not Progressing Goal: Ability to interact with others will improve Outcome: Not Progressing

## 2021-07-02 NOTE — [facility] Counselor (Signed)
CSW spoke with the patient's guardian.  CSW informed that she is attempting to have the pt shower and change clothing as the patient has been noted to still be wearing the same outfit from admission.    CSW was informed that patient has court in [place] on 07/04/2021.  CSW will make the psychiatrist aware of this.    Guardian reports that he was unaware that pt remains this disorganized.  He is new to the patient and thought pt may improve.   legal guardian, [employer]ne [employer] [facility] [place]) [phone]  Penni Homans, MSW, LCSW 07/02/2021 3:57 PM

## 2021-07-02 NOTE — Progress Notes (Signed)
Patient spent most of the evening pacing the hallways. Observed responding to internal stimuli as he walks up and down the hall talking and laughing to himself. He denies all - si/hi/avh/depression/ anxiety and pain when inquiring about any symptoms that he may be exhibiting.  He refused medication during initial medication pass and attempts were made several times to try to coax him to take medicine. He stated that "I am tired of people telling me what to eat" every time  taking medication was discussed. Will continue to monitor him with Q 15 minute safety checks.       Cleo Butler-Nicholson, LPN

## 2021-07-02 NOTE — Progress Notes (Signed)
Recreation Therapy Notes    Date: 07/02/2021  Time: 10:30 am    Location: Craft room  Behavioral response: Appropriate  Intervention Topic: Goals    Discussion/Intervention:  Group content on today was focused on goals. Patients described what goals are and how they define goals. Individuals expressed how they go about setting goals and reaching them. The group identified how important goals are and if they make short term goals to reach long term goals. Patients described how many goals they work on at a time and what affects them not reaching their goal. Individuals described how much time they put into planning and obtaining their goals. The group participated in the intervention "My Goal Board" and made personal goal boards to help them achieve their goal. Clinical Observations/Feedback: Patient came to group and was not engaged in the conversation. He explained that his goal is to stay in  the hospital.  Daymon Hora LRT/CTRS          Aneta Hendershott 07/02/2021 1:13 PM

## 2021-07-02 NOTE — Progress Notes (Signed)
Patient observed pacing on the unit throughout the shift. Agreed to take night evening medications. Seen interacting with some peers and more cooperative with staff. No adverse reaction to medication noted. Cont Q15 minute check for safety.

## 2021-07-02 NOTE — Progress Notes (Signed)
Patient pacing in the hallway this morning. When approached by the nurse to perform assessment, Pt  stated, " I'm gone wait to talk to the doctor. " RN unable to ask assessment questions based on patients refusal to speak.  Patient later requested another RN to give medications. Will cont to monitor for safety.

## 2021-07-03 DIAGNOSIS — F200 Paranoid schizophrenia: Secondary | ICD-10-CM | POA: Diagnosis not present

## 2021-07-03 NOTE — Group Note (Signed)
[facility] LCSW Group Therapy Note   Group Date: 07/03/2021 Start Time: 1300 End Time: 1400  Type of Therapy/Topic:  Group Therapy:  Feelings about Diagnosis  Participation Level:  Did Not Attend   Description of Group:    This group will allow patients to explore their thoughts and feelings about diagnoses they have received. Patients will be guided to explore their level of understanding and acceptance of these diagnoses. Facilitator will encourage patients to process their thoughts and feelings about the reactions of others to their diagnosis, and will guide patients in identifying ways to discuss their diagnosis with significant others in their lives. This group will be process-oriented, with patients participating in exploration of their own experiences as well as giving and receiving support and challenge from other group members.   Therapeutic Goals: 1. Patient will demonstrate understanding of diagnosis as evidence by identifying two or more symptoms of the disorder:  2. Patient will be able to express two feelings regarding the diagnosis 3. Patient will demonstrate ability to communicate their needs through discussion and/or role plays  Summary of Patient Progress: Patient given the opportunity to attend group, however, declined to attend.    Therapeutic Modalities:   Cognitive Behavioral Therapy Brief Therapy Feelings Identification    Harden Mo, LCSW

## 2021-07-03 NOTE — Plan of Care (Signed)
See progress note Problem: Activity: Goal: Will verbalize the importance of balancing activity with adequate rest periods Outcome: Not Progressing   Problem: Activity: Goal: Will verbalize the importance of balancing activity with adequate rest periods Outcome: Not Progressing   Problem: Education: Goal: Will be free of psychotic symptoms Outcome: Not Progressing   Problem: Coping: Goal: Coping ability will improve Outcome: Not Progressing   Problem: Coping: Goal: Will verbalize feelings Outcome: Not Progressing   Problem: Education: Goal: Knowledge of the prescribed therapeutic regimen will improve Outcome: Not Progressing   Problem: Health Behavior/Discharge Planning: Goal: Compliance with prescribed medication regimen will improve Outcome: Not Progressing

## 2021-07-03 NOTE — Plan of Care (Signed)
  Problem: Activity: Goal: Will verbalize the importance of balancing activity with adequate rest periods Outcome: Not Progressing   Problem: Education: Goal: Will be free of psychotic symptoms Outcome: Not Progressing Goal: Knowledge of the prescribed therapeutic regimen will improve Outcome: Not Progressing   Problem: Coping: Goal: Coping ability will improve Outcome: Not Progressing Goal: Will verbalize feelings Outcome: Not Progressing   Problem: Health Behavior/Discharge Planning: Goal: Compliance with prescribed medication regimen will improve Outcome: Not Progressing   Problem: Nutritional: Goal: Ability to achieve adequate nutritional intake will improve Outcome: Not Progressing   Problem: Role Relationship: Goal: Ability to communicate needs accurately will improve Outcome: Not Progressing Goal: Ability to interact with others will improve Outcome: Not Progressing

## 2021-07-03 NOTE — Progress Notes (Signed)
[facility] MD Progress Note  07/03/2021 4:23 PM David Davila.  MRN:  [id number] Subjective: Follow-up for this patient with schizophrenia.  Talk to me today but not very much.  Continues to insist that he does not eat "dead things".  This includes plants apparently.  Would not tell me what he would eat but is not eating very much.  Continues to be confused and paranoid not very easy to communicate with.  Has refused blood draws multiple times Principal Problem: Schizophrenia, paranoid type (HCC) Diagnosis: Principal Problem:   Schizophrenia, paranoid type (HCC) Active Problems:   Noncompliance with medication regimen   TBI (traumatic brain injury)  Total Time spent with patient: 30 minutes  Past Psychiatric History: Past history of schizophrenia  Past Medical History:  Past Medical History:  Diagnosis Date   Acute psychosis (HCC)    Antisocial personality disorder (HCC)    Cannabis abuse    Past   Paranoid schizophrenia (HCC)    Schizoaffective disorder, bipolar type (HCC)    History reviewed. No pertinent surgical history. Family History: History reviewed. No pertinent family history. Family Psychiatric  History: See previous Social History:  Social History   Substance and Sexual Activity  Alcohol Use No     Social History   Substance and Sexual Activity  Drug Use Not Currently    Social History   Socioeconomic History   Marital status: Single    Spouse name: Not on file   Number of children: Not on file   Years of education: Not on file   Highest education level: Not on file  Occupational History   Not on file  Tobacco Use   Smoking status: Every Day    Packs/day: 1.00    Types: Cigarettes   Smokeless tobacco: Never  Vaping Use   Vaping Use: Unknown  Substance and Sexual Activity   Alcohol use: No   Drug use: Not Currently   Sexual activity: Not Currently  Other Topics Concern   Not on file  Social History Narrative   Not on file   Social  Determinants of Health   Financial Resource Strain: Not on file  Food Insecurity: Not on file  Transportation Needs: Not on file  Physical Activity: Not on file  Stress: Not on file  Social Connections: Not on file   Additional Social History:                         Sleep: Fair  Appetite:  Poor  Current Medications: Current Facility-Administered Medications  Medication Dose Route Frequency Provider Last Rate Last Admin   acetaminophen (TYLENOL) tablet 650 mg  650 mg Oral Q6H PRN Charm Rings, NP       alum & mag hydroxide-simeth (MAALOX/MYLANTA) 200-200-20 MG/5ML suspension 30 mL  30 mL Oral Q4H PRN Charm Rings, NP       atorvastatin (LIPITOR) tablet 10 mg  10 mg Oral q1800 Deal, Adonis Housekeeper, RPH   10 mg at 06/27/21 2135   benztropine (COGENTIN) tablet 1 mg  1 mg Oral BID Charm Rings, NP   1 mg at 07/03/21 0809   divalproex (DEPAKOTE) DR tablet 1,000 mg  1,000 mg Oral Blenda Mounts, NP   1,000 mg at 07/03/21 0816   haloperidol (HALDOL) tablet 10 mg  10 mg Oral BID Takaya Hyslop, Jackquline Denmark, MD   10 mg at 07/03/21 0809   magnesium hydroxide (MILK OF MAGNESIA) suspension 30 mL  30 mL Oral Daily PRN Charm Rings, NP       mirtazapine (REMERON) tablet 15 mg  15 mg Oral QHS Charm Rings, NP   15 mg at 06/27/21 2135   OLANZapine (ZYPREXA) tablet 10 mg  10 mg Oral BID Charm Rings, NP   10 mg at 07/03/21 0809   traZODone (DESYREL) tablet 100 mg  100 mg Oral QHS PRN Charm Rings, NP   100 mg at 06/27/21 2135    Lab Results: No results found for this or any previous visit (from the past 48 hour(s)).  Blood Alcohol level:  Lab Results  Component Value Date   ETH <10 06/14/2021   ETH <10 04/13/2021    Metabolic Disorder Labs: Lab Results  Component Value Date   HGBA1C 5.3 06/29/2021   MPG 105.41 06/29/2021   MPG 108.28 03/25/2018   No results found for: PROLACTIN Lab Results  Component Value Date   CHOL 210 (H) 06/29/2021   TRIG 74 06/29/2021    HDL 60 06/29/2021   CHOLHDL 3.5 06/29/2021   VLDL 15 06/29/2021   LDLCALC 135 (H) 06/29/2021   LDLCALC 125 (H) 03/25/2018    Physical Findings: AIMS: Facial and Oral Movements Muscles of Facial Expression: None, normal Lips and Perioral Area: None, normal Jaw: None, normal Tongue: None, normal,Extremity Movements Upper (arms, wrists, hands, fingers): None, normal Lower (legs, knees, ankles, toes): None, normal, Trunk Movements Neck, shoulders, hips: None, normal, Overall Severity Severity of abnormal movements (highest score from questions above): None, normal Incapacitation due to abnormal movements: None, normal Patient's awareness of abnormal movements (rate only patient's report): No Awareness, Dental Status Current problems with teeth and/or dentures?: No Does patient usually wear dentures?: No  CIWA:    COWS:     Musculoskeletal: Strength & Muscle Tone: within normal limits Gait & Station: normal Patient leans: N/A  Psychiatric Specialty Exam:  Presentation  General Appearance: Casual; Neat  Eye Contact:Fleeting  Speech:Garbled  Speech Volume:Decreased  Handedness:Right   Mood and Affect  Mood:Anxious; Irritable  Affect:Constricted   Thought Process  Thought Processes:Disorganized; Irrevelant  Descriptions of Associations:Loose  Orientation:Partial  Thought Content:Paranoid Ideation; Illogical  History of Schizophrenia/Schizoaffective disorder:Yes  Duration of Psychotic Symptoms:Greater than six months  Hallucinations:No data recorded Ideas of Reference:Paranoia  Suicidal Thoughts:No data recorded Homicidal Thoughts:No data recorded  Sensorium  Memory:Immediate Good  Judgment:Impaired  Insight:Poor   Executive Functions  Concentration:Poor  Attention Span:Poor  Recall:Fair  Fund of Knowledge:Fair  Language:Fair   Psychomotor Activity  Psychomotor Activity:No data recorded  Assets  Assets:Physical Health   Sleep   Sleep:No data recorded   Physical Exam: Physical Exam Vitals and nursing note reviewed.  Constitutional:      Appearance: Normal appearance.  HENT:     Head: Normocephalic and atraumatic.     Mouth/Throat:     Pharynx: Oropharynx is clear.  Eyes:     Pupils: Pupils are equal, round, and reactive to light.  Cardiovascular:     Rate and Rhythm: Normal rate and regular rhythm.  Pulmonary:     Effort: Pulmonary effort is normal.     Breath sounds: Normal breath sounds.  Abdominal:     General: Abdomen is flat.     Palpations: Abdomen is soft.  Musculoskeletal:        General: Normal range of motion.  Skin:    General: Skin is warm and dry.  Neurological:     General: No focal deficit present.  Mental Status: He is alert. Mental status is at baseline.  Psychiatric:        Attention and Perception: He is inattentive.        Mood and Affect: Mood normal. Affect is blunt and inappropriate.        Speech: He is noncommunicative. Speech is tangential.        Behavior: Behavior is slowed.        Thought Content: Thought content is delusional.   Review of Systems  Constitutional: Negative.   HENT: Negative.    Eyes: Negative.   Respiratory: Negative.    Cardiovascular: Negative.   Gastrointestinal: Negative.   Musculoskeletal: Negative.   Skin: Negative.   Neurological: Negative.   Psychiatric/Behavioral: Negative.    Blood pressure 118/79, pulse 74, temperature (!) 97.5 F (36.4 C), temperature source Oral, resp. rate 18, height 5\' 10"  (1.778 m), weight 55.8 kg, SpO2 99 %. Body mass index is 17.65 kg/m.   Treatment Plan Summary: Plan hard to adjust medication when he will not allow to check a Depakote level.  Already on high doses of multiple medicines.  We may need to resort to forcing injections to try and break through this and get more medicine into him in the hope that eventually he will get into a clear state of mind.  [place], MD 07/03/2021, 4:23  PM

## 2021-07-03 NOTE — Progress Notes (Signed)
Pt up this am in the  dining room for breakfast. RN requested a breakfast that he come to the medication room to take his medications. Pt discussed his medication, offered explanations about his them and then stated he would take them. Pt came to the medication and took his medication, after discussing what they were. RN talked to the patient while he was pacing in the hall. He stated, "I'm here because the cops brought me here, they watching me." He has paranoid delusions, walks the halls laughing and talking to himself. He stated, "you all are trying to drug me and take my blood , to give it to somebody else. You stick me in my arm and that hurts, he next thing you want to do is stick me in my butt." He denies SI/HI thoughts and A/V hallucinations. He is responding to internal stimuli, his thoughts don't make sense, and he is intense about his delusional thoughts. Visible on the unit minimal interaction with peers  and staff, but easy to engage.

## 2021-07-03 NOTE — Progress Notes (Signed)
Patient sitting alone eating eating his snack talking to himself.  Attempted to get him to take his medication hoping the food would preoccupy him, but he refused.    Cleo Butler-Nicholson, LPN

## 2021-07-03 NOTE — Progress Notes (Signed)
Met with patient in his room. Inquired if he would be willing to take his QHS medication.  Patient wraps himself up in his blankets and pulls the cover over his head.  Patient again refused his medications. Will continue to monitor with Q 15 minute safety checks.    David Butler-Nicholson, LPN

## 2021-07-03 NOTE — Progress Notes (Signed)
Recreation Therapy Notes   Date: 07/03/2021  Time: 10:00 am    Location: Craft room  Behavioral response: Appropriate  Intervention Topic: Problem Solving   Discussion/Intervention:  Group content on today was focused on problem solving. The group described what problem solving is. Patients expressed how problems affect them and how they deal with problems. Individuals identified healthy ways to deal with problems. Patients explained what normally happens to them when they do not deal with problems. The group expressed reoccurring problems for them. The group participated in the intervention "Ways to Solve problems" where patients were given a chance to explore different ways to solve problems.  Clinical Observations/Feedback: Patient came to the door of the craft room and explained to writer that he would like to come in group and just listen because he gets confused easily. Writer asked patient if he would at least introduce himself. He explained that he could not because he is  confused about his name. He stated that everyone is calling him David Davila but he woke up as David Davila and would like to be called David Davila. Individual was able to sit it group and focus on what peers and staff had to say about problem solving.  Gerrica Cygan LRT/CTRS         Kasheem Toner 07/03/2021 11:28 AM        Carlee Tesfaye 07/03/2021 11:26 AM

## 2021-07-04 DIAGNOSIS — F200 Paranoid schizophrenia: Secondary | ICD-10-CM | POA: Diagnosis not present

## 2021-07-04 LAB — VALPROIC ACID LEVEL: Valproic Acid Lvl: 37 ug/mL — ABNORMAL LOW (ref 50.0–100.0)

## 2021-07-04 MED ORDER — OLANZAPINE 5 MG PO TABS
15.0000 mg | ORAL_TABLET | Freq: Two times a day (BID) | ORAL | Status: DC
  Administered 2021-07-04 – 2021-07-25 (×35): 15 mg via ORAL
  Filled 2021-07-04 (×40): qty 1

## 2021-07-04 NOTE — Progress Notes (Signed)
[facility] MD Progress Note  07/04/2021 1:55 PM David Davila.  MRN:  [id number] Subjective: Follow-up for this 40 year old man with schizophrenia.  He continues to take his medicine is nearly as we can tell (although it is hard to be definite about it since he refuses to allow a Depakote level) but even though he is probably a little better than when he came in he still psychotic.  Paces around at times.  Not really clear whether this is akathisia or not since at other times he will sit or lie still for quite a while.  When approached he still disorganized in his speech talking about how he does not want to eat "dead things" and being fixated on his jail situation.  Will not really listen or engage in reasonable conversation.  No insight no understanding of his legal situation or really his personal situation. Principal Problem: Schizophrenia, paranoid type (HCC) Diagnosis: Principal Problem:   Schizophrenia, paranoid type (HCC) Active Problems:   Noncompliance with medication regimen   TBI (traumatic brain injury)  Total Time spent with patient: 30 minutes  Past Psychiatric History: Past history of recurrent psychosis.  Previously had responded okay to medicine although seems to be having a worse time this hospitalization  Past Medical History:  Past Medical History:  Diagnosis Date   Acute psychosis (HCC)    Antisocial personality disorder (HCC)    Cannabis abuse    Past   Paranoid schizophrenia (HCC)    Schizoaffective disorder, bipolar type (HCC)    History reviewed. No pertinent surgical history. Family History: History reviewed. No pertinent family history. Family Psychiatric  History: See previous Social History:  Social History   Substance and Sexual Activity  Alcohol Use No     Social History   Substance and Sexual Activity  Drug Use Not Currently    Social History   Socioeconomic History   Marital status: Single    Spouse name: Not on file   Number of  children: Not on file   Years of education: Not on file   Highest education level: Not on file  Occupational History   Not on file  Tobacco Use   Smoking status: Every Day    Packs/day: 1.00    Types: Cigarettes   Smokeless tobacco: Never  Vaping Use   Vaping Use: Unknown  Substance and Sexual Activity   Alcohol use: No   Drug use: Not Currently   Sexual activity: Not Currently  Other Topics Concern   Not on file  Social History Narrative   Not on file   Social Determinants of Health   Financial Resource Strain: Not on file  Food Insecurity: Not on file  Transportation Needs: Not on file  Physical Activity: Not on file  Stress: Not on file  Social Connections: Not on file   Additional Social History:                         Sleep: Fair  Appetite:  Fair  Current Medications: Current Facility-Administered Medications  Medication Dose Route Frequency Provider Last Rate Last Admin   acetaminophen (TYLENOL) tablet 650 mg  650 mg Oral Q6H PRN Charm Rings, NP       alum & mag hydroxide-simeth (MAALOX/MYLANTA) 200-200-20 MG/5ML suspension 30 mL  30 mL Oral Q4H PRN Charm Rings, NP       atorvastatin (LIPITOR) tablet 10 mg  10 mg Oral q1800 Deal, Adonis Housekeeper, [place]  10 mg at 07/03/21 2013   benztropine (COGENTIN) tablet 1 mg  1 mg Oral BID Charm Rings, NP   1 mg at 07/04/21 6144   divalproex (DEPAKOTE) DR tablet 1,000 mg  1,000 mg Oral Earl Lites, Herminio Heads, NP   1,000 mg at 07/04/21 0818   haloperidol (HALDOL) tablet 10 mg  10 mg Oral BID Griff Badley, Jackquline Denmark, MD   10 mg at 07/04/21 0818   magnesium hydroxide (MILK OF MAGNESIA) suspension 30 mL  30 mL Oral Daily PRN Charm Rings, NP       mirtazapine (REMERON) tablet 15 mg  15 mg Oral QHS Charm Rings, NP   15 mg at 07/03/21 2015   OLANZapine (ZYPREXA) tablet 15 mg  15 mg Oral BID Raciel Caffrey, Jackquline Denmark, MD       traZODone (DESYREL) tablet 100 mg  100 mg Oral QHS PRN Charm Rings, NP   100 mg at 07/03/21 2016     Lab Results:  Results for orders placed or performed during the hospital encounter of 06/22/21 (from the past 48 hour(s))  Valproic acid level     Status: Abnormal   Collection Time: 07/04/21 10:50 AM  Result Value Ref Range   Valproic Acid Lvl 37 (L) 50.0 - 100.0 ug/mL    Comment: Performed at [facility], [address] [place], [place] [address]    Blood Alcohol level:  Lab Results  Component Value Date   [facility] <10 06/14/2021   ETH <10 04/13/2021    Metabolic Disorder Labs: Lab Results  Component Value Date   HGBA1C 5.3 06/29/2021   MPG 105.41 06/29/2021   MPG 108.28 03/25/2018   No results found for: PROLACTIN Lab Results  Component Value Date   CHOL 210 (H) 06/29/2021   TRIG 74 06/29/2021   HDL 60 06/29/2021   CHOLHDL 3.5 06/29/2021   VLDL 15 06/29/2021   LDLCALC 135 (H) 06/29/2021   LDLCALC 125 (H) 03/25/2018    Physical Findings: AIMS: Facial and Oral Movements Muscles of Facial Expression: None, normal Lips and Perioral Area: None, normal Jaw: None, normal Tongue: None, normal,Extremity Movements Upper (arms, wrists, hands, fingers): None, normal Lower (legs, knees, ankles, toes): None, normal, Trunk Movements Neck, shoulders, hips: None, normal, Overall Severity Severity of abnormal movements (highest score from questions above): None, normal Incapacitation due to abnormal movements: None, normal Patient's awareness of abnormal movements (rate only patient's report): No Awareness, Dental Status Current problems with teeth and/or dentures?: No Does patient usually wear dentures?: No  CIWA:    COWS:     Musculoskeletal: Strength & Muscle Tone: within normal limits Gait & Station: normal Patient leans: N/A  Psychiatric Specialty Exam:  Presentation  General Appearance: Casual; Neat  Eye Contact:Fleeting  Speech:Garbled  Speech Volume:Decreased  Handedness:Right   Mood and Affect  Mood:Anxious;  Irritable  Affect:Constricted   Thought Process  Thought Processes:Disorganized; Irrevelant  Descriptions of Associations:Loose  Orientation:Partial  Thought Content:Paranoid Ideation; Illogical  History of Schizophrenia/Schizoaffective disorder:Yes  Duration of Psychotic Symptoms:Greater than six months  Hallucinations:No data recorded Ideas of Reference:Paranoia  Suicidal Thoughts:No data recorded Homicidal Thoughts:No data recorded  Sensorium  Memory:Immediate Good  Judgment:Impaired  Insight:Poor   Executive Functions  Concentration:Poor  Attention Span:Poor  Recall:Fair  Fund of Knowledge:Fair  Language:Fair   Psychomotor Activity  Psychomotor Activity:No data recorded  Assets  Assets:Physical Health   Sleep  Sleep:No data recorded   Physical Exam: Physical Exam Vitals and nursing note reviewed.  Constitutional:      Appearance: Normal appearance.  HENT:     Head: Normocephalic and atraumatic.     Mouth/Throat:     Pharynx: Oropharynx is clear.  Eyes:     Pupils: Pupils are equal, round, and reactive to light.  Cardiovascular:     Rate and Rhythm: Normal rate and regular rhythm.  Pulmonary:     Effort: Pulmonary effort is normal.     Breath sounds: Normal breath sounds.  Abdominal:     General: Abdomen is flat.     Palpations: Abdomen is soft.  Musculoskeletal:        General: Normal range of motion.  Skin:    General: Skin is warm and dry.  Neurological:     General: No focal deficit present.     Mental Status: He is alert. Mental status is at baseline.  Psychiatric:        Attention and Perception: He is inattentive.        Mood and Affect: Mood normal. Affect is blunt and inappropriate.        Speech: He is noncommunicative. Speech is tangential.        Behavior: Behavior is agitated. Behavior is not aggressive.        Thought Content: Thought content is paranoid and delusional.        Cognition and Memory: Cognition is  impaired.        Judgment: Judgment is inappropriate.   Review of Systems  Constitutional: Negative.   HENT: Negative.    Eyes: Negative.   Respiratory: Negative.    Cardiovascular: Negative.   Gastrointestinal: Negative.   Musculoskeletal: Negative.   Skin: Negative.   Neurological: Negative.   Psychiatric/Behavioral:  Negative for depression, hallucinations, substance abuse and suicidal ideas. The patient is nervous/anxious.   Blood pressure 118/79, pulse 74, temperature (!) 97.5 F (36.4 C), temperature source Oral, resp. rate 18, height 5\' 10"  (1.778 m), weight 55.8 kg, SpO2 99 %. Body mass index is 17.65 kg/m.   Treatment Plan Summary: Medication management and Plan I am going to increase his olanzapine to 15 mg twice a day.  This is on top of the Haldol and the Depakote that he is taking.  There is still a possibility he may be cheeking although nobody has seen him do it.  Certainly still unwell and not able to participate as far as I can tell in any legal situation which would make discharge difficult since he would just take him back to jail. I tried to talk with him about the need to get his blood test done but he continues to refuse.  , MD 07/04/2021, 1:55 PM

## 2021-07-04 NOTE — Progress Notes (Signed)
Recreation Therapy Notes  Date: 07/04/2021  Time: 10:30am    Location:  Craft room    Behavioral response: N/A   Intervention Topic: Time management    Discussion/Intervention: Patient unable to attend group.  Clinical Observations/Feedback:  Patient unable to attend group.   Ophie Burrowes LRT/CTRS        Zia Kanner 07/04/2021 12:16 PM

## 2021-07-04 NOTE — Progress Notes (Signed)
Pt has been alert and oriented to person, place, time, but not to situation, unable to report a reason for admission. Pt became irritable about his name Lynx on his armband and was suspicious and paranoid reporting he thought that he was getting medication that were not supposed to be for him but instead, that the medication belonged to his neighbor who goes by the name of Quadarius. Pt was reassured that the medications were for him and that staff were aware he goes by the name "[name]" which he was fixated on this fact and was repeating this. Pt finally agreed to take his morning medications. Pt is noted to be responding to internal stimuli, often laughes inappropriately. Pt does not initiate interactions with staff or peers, eye contact is poor. Some thought blocking is noted when pt is approached by staff and staff call his name, even if staff are standing right in front of pt, pt will not respond and will continue to look downward and continue responding internally. Pt is visible in the dayroom watching tv often. Pt is out for meals. Will continue to monitor pt per Q15 minute face checks and monitor for safety and progress.

## 2021-07-04 NOTE — Progress Notes (Signed)
Patient alert and oriented x 3 he is not interacting with peers in the day room appropriately, he denies SI/HI/AVH but appears responding to internal stimuli, he was argumentative and agitated with Clinical research associate when asked to take night time medication. Patient later took his night time medication from another nurse. No distress noted, 15 minutes safety checks maintained will continue to monitor.

## 2021-07-04 NOTE — Plan of Care (Signed)
°  Problem: Group Participation °Goal: STG - Patient will engage in groups without prompting or encouragement from LRT x3 group sessions within 5 recreation therapy group sessions °Description: STG - Patient will engage in groups without prompting or encouragement from LRT x3 group sessions within 5 recreation therapy group sessions °Outcome: Progressing °  °

## 2021-07-04 NOTE — Group Note (Signed)
[facility] LCSW Group Therapy Note   Group Date: 07/04/2021 Start Time: 1310 End Time: 1400   Type of Therapy/Topic:  Group Therapy:  Emotion Regulation  Participation Level:  Did Not Attend   Mood:  Description of Group:    The purpose of this group is to assist patients in learning to regulate negative emotions and experience positive emotions. Patients will be guided to discuss ways in which they have been vulnerable to their negative emotions. These vulnerabilities will be juxtaposed with experiences of positive emotions or situations, and patients challenged to use positive emotions to combat negative ones. Special emphasis will be placed on coping with negative emotions in conflict situations, and patients will process healthy conflict resolution skills.  Therapeutic Goals: Patient will identify two positive emotions or experiences to reflect on in order to balance out negative emotions:  Patient will label two or more emotions that they find the most difficult to experience:  Patient will be able to demonstrate positive conflict resolution skills through discussion or role plays:   Summary of Patient Progress: X  Therapeutic Modalities:   Cognitive Behavioral Therapy Feelings Identification Dialectical Behavioral Therapy   Glenis Smoker, LCSW

## 2021-07-05 NOTE — Plan of Care (Signed)
  Problem: Education: Goal: Will be free of psychotic symptoms Outcome: Not Progressing Goal: Knowledge of the prescribed therapeutic regimen will improve Outcome: Not Progressing   Problem: Coping: Goal: Coping ability will improve Outcome: Not Progressing Goal: Will verbalize feelings Outcome: Not Progressing   Problem: Health Behavior/Discharge Planning: Goal: Compliance with prescribed medication regimen will improve Outcome: Not Progressing

## 2021-07-05 NOTE — Progress Notes (Signed)
[facility] MD Progress Note  07/05/2021 11:10 AM David Davila.  MRN:  [id number] Subjective: David Davila is seen and examined today.  He has been taking his medications as prescribed and denies any side effects.  He has no complaints today.  Principal Problem: Schizophrenia, paranoid type (HCC) Diagnosis: Principal Problem:   Schizophrenia, paranoid type (HCC) Active Problems:   Noncompliance with medication regimen   TBI (traumatic brain injury)  Total Time spent with patient: 15 minutes  Past Psychiatric History: Unremarkable  Past Medical History:  Past Medical History:  Diagnosis Date   Acute psychosis (HCC)    Antisocial personality disorder (HCC)    Cannabis abuse    Past   Paranoid schizophrenia (HCC)    Schizoaffective disorder, bipolar type (HCC)    History reviewed. No pertinent surgical history. Family History: History reviewed. No pertinent family history. Family Psychiatric  History: Unremarkable Social History:  Social History   Substance and Sexual Activity  Alcohol Use No     Social History   Substance and Sexual Activity  Drug Use Not Currently    Social History   Socioeconomic History   Marital status: Single    Spouse name: Not on file   Number of children: Not on file   Years of education: Not on file   Highest education level: Not on file  Occupational History   Not on file  Tobacco Use   Smoking status: Every Day    Packs/day: 1.00    Types: Cigarettes   Smokeless tobacco: Never  Vaping Use   Vaping Use: Unknown  Substance and Sexual Activity   Alcohol use: No   Drug use: Not Currently   Sexual activity: Not Currently  Other Topics Concern   Not on file  Social History Narrative   Not on file   Social Determinants of Health   Financial Resource Strain: Not on file  Food Insecurity: Not on file  Transportation Needs: Not on file  Physical Activity: Not on file  Stress: Not on file  Social Connections: Not on file    Additional Social History:                         Sleep: Good  Appetite:  Good  Current Medications: Current Facility-Administered Medications  Medication Dose Route Frequency Provider Last Rate Last Admin   acetaminophen (TYLENOL) tablet 650 mg  650 mg Oral Q6H PRN Charm Rings, NP       alum & mag hydroxide-simeth (MAALOX/MYLANTA) 200-200-20 MG/5ML suspension 30 mL  30 mL Oral Q4H PRN Charm Rings, NP       atorvastatin (LIPITOR) tablet 10 mg  10 mg Oral q1800 Deal, Adonis Housekeeper, RPH   10 mg at 07/03/21 2013   benztropine (COGENTIN) tablet 1 mg  1 mg Oral BID Charm Rings, NP   1 mg at 07/05/21 5456   divalproex (DEPAKOTE) DR tablet 1,000 mg  1,000 mg Oral Blenda Mounts, NP   1,000 mg at 07/05/21 2563   haloperidol (HALDOL) tablet 10 mg  10 mg Oral BID Clapacs, Jackquline Denmark, MD   10 mg at 07/05/21 8937   magnesium hydroxide (MILK OF MAGNESIA) suspension 30 mL  30 mL Oral Daily PRN Charm Rings, NP       mirtazapine (REMERON) tablet 15 mg  15 mg Oral QHS Charm Rings, NP   15 mg at 07/03/21 2015   OLANZapine (ZYPREXA) tablet 15 mg  15 mg Oral BID Clapacs, Jackquline Denmark, MD   15 mg at 07/05/21 1610   traZODone (DESYREL) tablet 100 mg  100 mg Oral QHS PRN Charm Rings, NP   100 mg at 07/03/21 2016    Lab Results:  Results for orders placed or performed during the hospital encounter of 06/22/21 (from the past 48 hour(s))  Valproic acid level     Status: Abnormal   Collection Time: 07/04/21 10:50 AM  Result Value Ref Range   Valproic Acid Lvl 37 (L) 50.0 - 100.0 ug/mL    Comment: Performed at [facility], [address] [place], [place] [address]    Blood Alcohol level:  Lab Results  Component Value Date   [facility] <10 06/14/2021   ETH <10 04/13/2021    Metabolic Disorder Labs: Lab Results  Component Value Date   HGBA1C 5.3 06/29/2021   MPG 105.41 06/29/2021   MPG 108.28 03/25/2018   No results found for: PROLACTIN Lab Results  Component  Value Date   CHOL 210 (H) 06/29/2021   TRIG 74 06/29/2021   HDL 60 06/29/2021   CHOLHDL 3.5 06/29/2021   VLDL 15 06/29/2021   LDLCALC 135 (H) 06/29/2021   LDLCALC 125 (H) 03/25/2018    Physical Findings: AIMS: Facial and Oral Movements Muscles of Facial Expression: None, normal Lips and Perioral Area: None, normal Jaw: None, normal Tongue: None, normal,Extremity Movements Upper (arms, wrists, hands, fingers): None, normal Lower (legs, knees, ankles, toes): None, normal, Trunk Movements Neck, shoulders, hips: None, normal, Overall Severity Severity of abnormal movements (highest score from questions above): None, normal Incapacitation due to abnormal movements: None, normal Patient's awareness of abnormal movements (rate only patient's report): No Awareness, Dental Status Current problems with teeth and/or dentures?: No Does patient usually wear dentures?: No  CIWA:    COWS:     Musculoskeletal: Strength & Muscle Tone: within normal limits Gait & Station: normal Patient leans: N/A  Psychiatric Specialty Exam:  Presentation  General Appearance: Casual; Neat  Eye Contact:Fleeting  Speech:Garbled  Speech Volume:Decreased  Handedness:Right   Mood and Affect  Mood:Anxious; Irritable  Affect:Constricted   Thought Process  Thought Processes:Disorganized; Irrevelant  Descriptions of Associations:Loose  Orientation:Partial  Thought Content:Paranoid Ideation; Illogical  History of Schizophrenia/Schizoaffective disorder:Yes  Duration of Psychotic Symptoms:Greater than six months  Hallucinations:No data recorded Ideas of Reference:Paranoia  Suicidal Thoughts:No data recorded Homicidal Thoughts:No data recorded  Sensorium  Memory:Immediate Good  Judgment:Impaired  Insight:Poor   Executive Functions  Concentration:Poor  Attention Span:Poor  Recall:Fair  Fund of Knowledge:Fair  Language:Fair   Psychomotor Activity  Psychomotor Activity:No  data recorded  Assets  Assets:Physical Health   Sleep  Sleep:No data recorded    Physical Exam Constitutional:      Appearance: Normal appearance. He is normal weight.  Neurological:     General: No focal deficit present.     Mental Status: He is alert and oriented to person, place, and time.  Psychiatric:        Attention and Perception: Attention normal.        Mood and Affect: Affect is labile.        Speech: Speech normal.        Behavior: Behavior normal. Behavior is cooperative.        Thought Content: Thought content normal.        Cognition and Memory: Cognition is impaired.        Judgment: Judgment normal.   ROS Blood pressure 118/79, pulse 74, temperature (!)  97.5 F (36.4 C), temperature source Oral, resp. rate 18, height 5\' 10"  (1.778 m), weight 55.8 kg, SpO2 99 %. Body mass index is 17.65 kg/m.   Treatment Plan Summary: Daily contact with patient to assess and evaluate symptoms and progress in treatment, Medication management, and Plan continue current medications.  Pattiann Solanki , DO 07/05/2021, 11:10 AM

## 2021-07-05 NOTE — Progress Notes (Signed)
Patient is observed walking the hallways. He eats 100% of meals and is medication compliant with a few staff members. Pt refused medications from me but accepted them with another nurse. Pt denies SI, HI, and AVH but is seen responding to stimuli and talking to self, smiling in the hallway. Pt is guarded and minimal in interaction. Will continue to monitor with q 15 minute safety checks.

## 2021-07-05 NOTE — Progress Notes (Signed)
Patient is isolative to his room.  He refused his meds this evening. He is labile in mood and raises his voice when he does not want to participate. Will continue to monitor with q15 minute safety checks. Will continue to try and engage him in conversation.     Cleo Butler-Nicholson, LPN

## 2021-07-06 DIAGNOSIS — F200 Paranoid schizophrenia: Secondary | ICD-10-CM | POA: Diagnosis not present

## 2021-07-06 MED ORDER — PALIPERIDONE PALMITATE ER 234 MG/1.5ML IM SUSY
234.0000 mg | PREFILLED_SYRINGE | Freq: Once | INTRAMUSCULAR | Status: AC
Start: 2021-07-07 — End: 2021-07-07
  Administered 2021-07-07: 234 mg via INTRAMUSCULAR
  Filled 2021-07-06 (×2): qty 1.5

## 2021-07-06 NOTE — Progress Notes (Signed)
Pt pacing the halls, laughing and talking to himself. Pt denies SI/HI, A/V hallucinations, but clearly responding to  nternal stimuli. He stated a chicken was a reincarnated [place]. Pleasant on approach, taking medication and cooperative on the unit.

## 2021-07-06 NOTE — Progress Notes (Signed)
Patient's condition unstable, labile and guarded forwards very little information, appears preoccupied and responding to internal stimuli but denies it. Patient noted in bed with eyes closed, no distress noted. 15 minutes safety checks maintained will continue to monitor.

## 2021-07-06 NOTE — Progress Notes (Signed)
[facility] MD Progress Note  07/06/2021 1:37 PM David Davila.  MRN:  [id number] Subjective: Follow-up for this patient with schizophrenia.  Staff report that over the last day or so he has had some pleasant conversations and been appropriate with them at times but when I went to see him today he was irritable and disorganized again.  The only thing he wanted to talk about was why I supposedly did not call him by his correct name.  Patient insists that his correct name is David Davila   I have been addressing him as [name] ever since the first day.  I pointed out to him that I did not call him by the incorrect name.  Patient is focused on his wrist band and wants to argue about it.  I tried to explain that I really was not concerned about the wristband but wanted to discuss him and his symptoms but he just started ranting about the wristband.  I am starting to have my suspicions that the man is not taking his medicines and there is been no weighted demonstrated since he has refused to allow [place] to check a Depakote level Principal Problem: Schizophrenia, paranoid type (HCC) Diagnosis: Principal Problem:   Schizophrenia, paranoid type (HCC) Active Problems:   Noncompliance with medication regimen   TBI (traumatic brain injury)  Total Time spent with patient: 30 minutes  Past Psychiatric History: Long past history of psychotic disorder  Past Medical History:  Past Medical History:  Diagnosis Date   Acute psychosis (HCC)    Antisocial personality disorder (HCC)    Cannabis abuse    Past   Paranoid schizophrenia (HCC)    Schizoaffective disorder, bipolar type (HCC)    History reviewed. No pertinent surgical history. Family History: History reviewed. No pertinent family history. Family Psychiatric  History: See previous Social History:  Social History   Substance and Sexual Activity  Alcohol Use No     Social History   Substance and Sexual Activity  Drug Use Not Currently    Social  History   Socioeconomic History   Marital status: Single    Spouse name: Not on file   Number of children: Not on file   Years of education: Not on file   Highest education level: Not on file  Occupational History   Not on file  Tobacco Use   Smoking status: Every Day    Packs/day: 1.00    Types: Cigarettes   Smokeless tobacco: Never  Vaping Use   Vaping Use: Unknown  Substance and Sexual Activity   Alcohol use: No   Drug use: Not Currently   Sexual activity: Not Currently  Other Topics Concern   Not on file  Social History Narrative   Not on file   Social Determinants of Health   Financial Resource Strain: Not on file  Food Insecurity: Not on file  Transportation Needs: Not on file  Physical Activity: Not on file  Stress: Not on file  Social Connections: Not on file   Additional Social History:                         Sleep: Fair  Appetite:  Poor  Current Medications: Current Facility-Administered Medications  Medication Dose Route Frequency Provider Last Rate Last Admin   acetaminophen (TYLENOL) tablet 650 mg  650 mg Oral Q6H PRN Charm Rings, NP       alum & mag hydroxide-simeth (MAALOX/MYLANTA) 200-200-20 MG/5ML suspension 30 mL  30 mL Oral Q4H PRN Charm Rings, NP       atorvastatin (LIPITOR) tablet 10 mg  10 mg Oral q1800 Deal, Adonis Housekeeper, RPH   10 mg at 07/05/21 2053   benztropine (COGENTIN) tablet 1 mg  1 mg Oral BID Charm Rings, NP   1 mg at 07/06/21 0739   divalproex (DEPAKOTE) DR tablet 1,000 mg  1,000 mg Oral Earl Lites, Herminio Heads, NP   1,000 mg at 07/06/21 0740   haloperidol (HALDOL) tablet 10 mg  10 mg Oral BID Giavanna Kang, Jackquline Denmark, MD   10 mg at 07/06/21 1829   magnesium hydroxide (MILK OF MAGNESIA) suspension 30 mL  30 mL Oral Daily PRN Charm Rings, NP       mirtazapine (REMERON) tablet 15 mg  15 mg Oral QHS Charm Rings, NP   15 mg at 07/05/21 2053   OLANZapine (ZYPREXA) tablet 15 mg  15 mg Oral BID Jejuan Scala, Jackquline Denmark, MD   15  mg at 07/06/21 0738   traZODone (DESYREL) tablet 100 mg  100 mg Oral QHS PRN Charm Rings, NP   100 mg at 07/05/21 2053    Lab Results: No results found for this or any previous visit (from the past 48 hour(s)).  Blood Alcohol level:  Lab Results  Component Value Date   ETH <10 06/14/2021   ETH <10 04/13/2021    Metabolic Disorder Labs: Lab Results  Component Value Date   HGBA1C 5.3 06/29/2021   MPG 105.41 06/29/2021   MPG 108.28 03/25/2018   No results found for: PROLACTIN Lab Results  Component Value Date   CHOL 210 (H) 06/29/2021   TRIG 74 06/29/2021   HDL 60 06/29/2021   CHOLHDL 3.5 06/29/2021   VLDL 15 06/29/2021   LDLCALC 135 (H) 06/29/2021   LDLCALC 125 (H) 03/25/2018    Physical Findings: AIMS: Facial and Oral Movements Muscles of Facial Expression: None, normal Lips and Perioral Area: None, normal Jaw: None, normal Tongue: None, normal,Extremity Movements Upper (arms, wrists, hands, fingers): None, normal Lower (legs, knees, ankles, toes): None, normal, Trunk Movements Neck, shoulders, hips: None, normal, Overall Severity Severity of abnormal movements (highest score from questions above): None, normal Incapacitation due to abnormal movements: None, normal Patient's awareness of abnormal movements (rate only patient's report): No Awareness, Dental Status Current problems with teeth and/or dentures?: No Does patient usually wear dentures?: No  CIWA:    COWS:     Musculoskeletal: Strength & Muscle Tone: within normal limits Gait & Station: normal Patient leans: N/A  Psychiatric Specialty Exam:  Presentation  General Appearance: Casual; Neat  Eye Contact:Fleeting  Speech:Garbled  Speech Volume:Decreased  Handedness:Right   Mood and Affect  Mood:Anxious; Irritable  Affect:Constricted   Thought Process  Thought Processes:Disorganized; Irrevelant  Descriptions of Associations:Loose  Orientation:Partial  Thought Content:Paranoid  Ideation; Illogical  History of Schizophrenia/Schizoaffective disorder:Yes  Duration of Psychotic Symptoms:Greater than six months  Hallucinations:No data recorded Ideas of Reference:Paranoia  Suicidal Thoughts:No data recorded Homicidal Thoughts:No data recorded  Sensorium  Memory:Immediate Good  Judgment:Impaired  Insight:Poor   Executive Functions  Concentration:Poor  Attention Span:Poor  Recall:Fair  Fund of Knowledge:Fair  Language:Fair   Psychomotor Activity  Psychomotor Activity:No data recorded  Assets  Assets:Physical Health   Sleep  Sleep:No data recorded   Physical Exam: Physical Exam Vitals and nursing note reviewed.  Constitutional:      Appearance: Normal appearance.  HENT:     Head: Normocephalic and atraumatic.  Mouth/Throat:     Pharynx: Oropharynx is clear.  Eyes:     Pupils: Pupils are equal, round, and reactive to light.  Cardiovascular:     Rate and Rhythm: Normal rate and regular rhythm.  Pulmonary:     Effort: Pulmonary effort is normal.     Breath sounds: Normal breath sounds.  Abdominal:     General: Abdomen is flat.     Palpations: Abdomen is soft.  Musculoskeletal:        General: Normal range of motion.  Skin:    General: Skin is warm and dry.  Neurological:     General: No focal deficit present.     Mental Status: He is alert. Mental status is at baseline.  Psychiatric:        Attention and Perception: He is inattentive.        Mood and Affect: Affect is labile and inappropriate.        Speech: Speech is tangential.        Behavior: Behavior is agitated. Behavior is not aggressive.        Thought Content: Thought content is paranoid and delusional.        Cognition and Memory: Memory is impaired.   Review of Systems  Constitutional: Negative.   HENT: Negative.    Eyes: Negative.   Respiratory: Negative.    Cardiovascular: Negative.   Gastrointestinal: Negative.   Musculoskeletal: Negative.   Skin:  Negative.   Neurological: Negative.   Psychiatric/Behavioral:  Negative for depression, hallucinations, memory loss, substance abuse and suicidal ideas. The patient is not nervous/anxious and does not have insomnia.   Blood pressure 117/81, pulse 79, temperature 97.6 F (36.4 C), temperature source Oral, resp. rate 17, height 5\' 10"  (1.778 m), weight 55.8 kg, SpO2 100 %. Body mass index is 17.65 kg/m.   Treatment Plan Summary: Medication management and Plan given that I am not really making any communication with the patient and his symptoms do not seem to be improving I think that it is justified to switch tactics to forcing long-acting injectables.  Patient of course has refused these medicines.  I am going to put an order in for forced medicine with the long-acting injectable making it clear that I think the potential benefits outweigh the risks and that the patient in his current condition is unlikely to improve without forced medications to the point that he would be able to get out of the hospital and also that he remains a danger to himself and others while still psychotic.  We will need to get the doctor over the weekend tomorrow to sign off a second opinion on the forced medicine order and then it can be administered.  No change to other orders  , MD 07/06/2021, 1:37 PM

## 2021-07-06 NOTE — Plan of Care (Signed)
See progress notes Problem: Activity: Goal: Will verbalize the importance of balancing activity with adequate rest periods Outcome: Progressing   Problem: Education: Goal: Will be free of psychotic symptoms Outcome: Progressing Goal: Knowledge of the prescribed therapeutic regimen will improve Outcome: Progressing   Problem: Coping: Goal: Coping ability will improve Outcome: Progressing Goal: Will verbalize feelings Outcome: Progressing   Problem: Health Behavior/Discharge Planning: Goal: Compliance with prescribed medication regimen will improve Outcome: Progressing   Problem: Nutritional: Goal: Ability to achieve adequate nutritional intake will improve Outcome: Progressing   Problem: Role Relationship: Goal: Ability to communicate needs accurately will improve Outcome: Progressing Goal: Ability to interact with others will improve Outcome: Progressing   Problem: Safety: Goal: Ability to redirect hostility and anger into socially appropriate behaviors will improve Outcome: Progressing Goal: Ability to remain free from injury will improve Outcome: Progressing   Problem: Self-Care: Goal: Ability to participate in self-care as condition permits will improve Outcome: Progressing   Problem: Self-Concept: Goal: Will verbalize positive feelings about self Outcome: Progressing   Problem: Consults Goal: Concurrent Medical Patient Education Description: (See Patient Education Module for education specifics) Outcome: Progressing   Problem: B[facility] Concurrent Medical Problem Goal: LTG-Pt will be physically stable and he/significant other Description: (Patient will be physically stable and he/significant other will be able to verbalize understanding of follow-up care and symptoms that would warrant further treatment) Outcome: Progressing Goal: STG-Vital signs will be within defined limits or stabilized Description: (STG- Vital signs will be within defined limits or stabilized  for individual) Outcome: Progressing Goal: STG-Compliance with medication and/or treatment as ordered Description: (STG-Compliance with medication and/or treatment as ordered by MD) Outcome: Progressing Goal: STG-Verbalize two symptoms that would warrant further Description: (STG-Verbalize two symptoms that would warrant further treatment) Outcome: Progressing Goal: STG-Patient will participate in management/stabilization Description: (STG-Patient will participate in management/stabilization of medical condition) Outcome: Progressing Goal: STG-Other (Specify): Description: STG-Other Concurrent Medical (Specify): Outcome: Progressing

## 2021-07-06 NOTE — Progress Notes (Signed)
Pt up, visible on the unit and minimal interaction with peers and staff. Pt came to get his medication, very talkative, smiling and joking with RN. He denies SI/HI, and A/V hallucinations. He is responding to internal stimuli, preoccupied/observed talking to himself and laughing. He denies that he has mental health problems and he is paranoid thinking we are " against him." He states he knows everything and sometime he does not make sense.

## 2021-07-06 NOTE — BH IP Treatment Plan (Signed)
Interdisciplinary Treatment and Diagnostic Plan Update  07/06/2021 Time of Session: 9:00 AM David Davila. MRN: [id number]  Principal Diagnosis: Schizophrenia, paranoid type (HCC)  Secondary Diagnoses: Principal Problem:   Schizophrenia, paranoid type (HCC) Active Problems:   Noncompliance with medication regimen   TBI (traumatic brain injury)   Current Medications:  Current Facility-Administered Medications  Medication Dose Route Frequency Provider Last Rate Last Admin   acetaminophen (TYLENOL) tablet 650 mg  650 mg Oral Q6H PRN Charm Rings, NP       alum & mag hydroxide-simeth (MAALOX/MYLANTA) 200-200-20 MG/5ML suspension 30 mL  30 mL Oral Q4H PRN Charm Rings, NP       atorvastatin (LIPITOR) tablet 10 mg  10 mg Oral q1800 Deal, Adonis Housekeeper, RPH   10 mg at 07/05/21 2053   benztropine (COGENTIN) tablet 1 mg  1 mg Oral BID Charm Rings, NP   1 mg at 07/06/21 0739   divalproex (DEPAKOTE) DR tablet 1,000 mg  1,000 mg Oral  Lites, Herminio Heads, NP   1,000 mg at 07/06/21 0740   haloperidol (HALDOL) tablet 10 mg  10 mg Oral BID Clapacs, Jackquline Denmark, MD   10 mg at 07/06/21 7619   magnesium hydroxide (MILK OF MAGNESIA) suspension 30 mL  30 mL Oral Daily PRN Charm Rings, NP       mirtazapine (REMERON) tablet 15 mg  15 mg Oral QHS Charm Rings, NP   15 mg at 07/05/21 2053   OLANZapine (ZYPREXA) tablet 15 mg  15 mg Oral BID Clapacs, Jackquline Denmark, MD   15 mg at 07/06/21 0738   traZODone (DESYREL) tablet 100 mg  100 mg Oral QHS PRN Charm Rings, NP   100 mg at 07/05/21 2053   PTA Medications: Medications Prior to Admission  Medication Sig Dispense Refill Last Dose   atorvastatin (LIPITOR) 10 MG tablet Take 1 tablet (10 mg total) by mouth daily at 6 PM. 30 tablet 0    benztropine (COGENTIN) 1 MG tablet Take 1 tablet (1 mg total) by mouth 2 (two) times daily. 60 tablet 0    divalproex (DEPAKOTE) 500 MG DR tablet Take 2 tablets (1,000 mg total) by mouth every morning. 60  tablet 0    haloperidol (HALDOL) 5 MG tablet Take 1 tablet (5 mg total) by mouth at bedtime. 30 tablet 0    mirtazapine (REMERON) 15 MG tablet Take 1 tablet (15 mg total) by mouth at bedtime. 30 tablet 0    OLANZapine (ZYPREXA) 10 MG tablet Take 1 tablet (10 mg total) by mouth 2 (two) times daily. 60 tablet 0    paliperidone (INVEGA SUSTENNA) 156 MG/ML SUSY injection Inject 1 mL (156 mg total) into the muscle once for 1 dose. (Patient not taking: Reported on 02/06/2021) 1 mL 0    paliperidone (INVEGA SUSTENNA) 234 MG/1.5ML SUSY injection Inject 234 mg into the muscle once for 1 dose. Next injection due 05/27/18 (Patient not taking: Reported on 02/06/2021) 1.5 mL 0    paliperidone (INVEGA) 6 MG 24 hr tablet Take 1 tablet (6 mg total) by mouth daily. (Patient not taking: No sig reported) 30 tablet 0    traZODone (DESYREL) 100 MG tablet Take 1 tablet (100 mg total) by mouth at bedtime as needed for sleep. (Patient not taking: Reported on 06/14/2021) 30 tablet 0     Patient Stressors: Financial difficulties   Medication change or noncompliance    Patient Strengths: Motivation for treatment/growth   Treatment  Modalities: Medication Management, Group therapy, Case management,  1 to 1 session with clinician, Psychoeducation, Recreational therapy.   Physician Treatment Plan for Primary Diagnosis: Schizophrenia, paranoid type (HCC) Long Term Goal(s): Improvement in symptoms so as ready for discharge   Short Term Goals: Ability to demonstrate self-control will improve Ability to maintain clinical measurements within normal limits will improve Compliance with prescribed medications will improve Ability to disclose and discuss suicidal ideas Ability to identify and develop effective coping behaviors will improve  Medication Management: Evaluate patient's response, side effects, and tolerance of medication regimen.  Therapeutic Interventions: 1 to 1 sessions, Unit Group sessions and Medication  administration.  Evaluation of Outcomes: Progressing  Physician Treatment Plan for Secondary Diagnosis: Principal Problem:   Schizophrenia, paranoid type (HCC) Active Problems:   Noncompliance with medication regimen   TBI (traumatic brain injury)  Long Term Goal(s): Improvement in symptoms so as ready for discharge   Short Term Goals: Ability to demonstrate self-control will improve Ability to maintain clinical measurements within normal limits will improve Compliance with prescribed medications will improve Ability to disclose and discuss suicidal ideas Ability to identify and develop effective coping behaviors will improve     Medication Management: Evaluate patient's response, side effects, and tolerance of medication regimen.  Therapeutic Interventions: 1 to 1 sessions, Unit Group sessions and Medication administration.  Evaluation of Outcomes: Progressing   RN Treatment Plan for Primary Diagnosis: Schizophrenia, paranoid type (HCC) Long Term Goal(s): Knowledge of disease and therapeutic regimen to maintain health will improve  Short Term Goals: Ability to remain free from injury will improve, Ability to verbalize frustration and anger appropriately will improve, Ability to demonstrate self-control, Ability to participate in decision making will improve, Ability to verbalize feelings will improve, Ability to disclose and discuss suicidal ideas, Ability to identify and develop effective coping behaviors will improve, and Compliance with prescribed medications will improve  Medication Management: RN will administer medications as ordered by provider, will assess and evaluate patient's response and provide education to patient for prescribed medication. RN will report any adverse and/or side effects to prescribing provider.  Therapeutic Interventions: 1 on 1 counseling sessions, Psychoeducation, Medication administration, Evaluate responses to treatment, Monitor vital signs and CBGs  as ordered, Perform/monitor CIWA, COWS, AIMS and Fall Risk screenings as ordered, Perform wound care treatments as ordered.  Evaluation of Outcomes: Progressing   LCSW Treatment Plan for Primary Diagnosis: Schizophrenia, paranoid type (HCC) Long Term Goal(s): Safe transition to appropriate next level of care at discharge, Engage patient in therapeutic group addressing interpersonal concerns.  Short Term Goals: Engage patient in aftercare planning with referrals and resources, Increase social support, Increase ability to appropriately verbalize feelings, Increase emotional regulation, Facilitate acceptance of mental health diagnosis and concerns, and Increase skills for wellness and recovery  Therapeutic Interventions: Assess for all discharge needs, 1 to 1 time with Social worker, Explore available resources and support systems, Assess for adequacy in community support network, Educate family and significant other(s) on suicide prevention, Complete Psychosocial Assessment, Interpersonal group therapy.  Evaluation of Outcomes: Progressing   Progress in Treatment: Attending groups: Yes. and No. Participating in groups: No. Taking medication as prescribed: No. Toleration medication: Yes. Family/Significant other contact made: Yes, individual(s) contacted:  patient's guardian contacted. Patient understands diagnosis: No. Discussing patient identified problems/goals with staff: No. Medical problems stabilized or resolved: Yes. Denies suicidal/homicidal ideation: Yes. Issues/concerns per patient self-inventory: No. Other: none.  New problem(s) identified: No, Describe:  none.  New Short Term/Long Term Goal(s): elimination  of symptoms of psychosis, medication management for mood stabilization; elimination of SI thoughts; development of comprehensive mental wellness/sobriety plan. Update 06/30/2021: No changes at this time. Update 07/06/21: No changes at this time.   Patient Goals: "I don't  know what they brought me to the hospital for." Update 06/30/2021: No changes at this time. Update 07/06/21: No changes at this time.   Discharge Plan or Barriers: CSW will assist pt/guardian with development of an appropriate aftercare/discharge plan.06/30/2021: No changes at this time. Update 07/06/21: No changes at this time.     Reason for Continuation of Hospitalization: Delusions  Medication stabilization   Estimated Length of Stay: TBD   Scribe for Treatment Team: Glenis Smoker, LCSW 07/06/2021 9:08 AM

## 2021-07-07 DIAGNOSIS — F200 Paranoid schizophrenia: Secondary | ICD-10-CM | POA: Diagnosis not present

## 2021-07-07 NOTE — Group Note (Signed)
LCSW Group Therapy Note  Group Date: 07/07/2021 Start Time: 1300 End Time: 1350   Type of Therapy and Topic:  Group Therapy - Healthy vs Unhealthy Coping Skills  Participation Level:  Did Not Attend   Description of Group The focus of this group was to determine what unhealthy coping techniques typically are used by group members and what healthy coping techniques would be helpful in coping with various problems. Patients were guided in becoming aware of the differences between healthy and unhealthy coping techniques. Patients were asked to identify 2-3 healthy coping skills they would like to learn to use more effectively.  Therapeutic Goals Patients learned that coping is what human beings do all day long to deal with various situations in their lives Patients defined and discussed healthy vs unhealthy coping techniques Patients identified their preferred coping techniques and identified whether these were healthy or unhealthy Patients determined 2-3 healthy coping skills they would like to become more familiar with and use more often. Patients provided support and ideas to each other   Summary of Patient Progress: Patient did not attend group despite encouraged participation.    Therapeutic Modalities Cognitive Behavioral Therapy Motivational Interviewing  Norberto Sorenson, Theresia Majors 07/07/2021  4:21 PM

## 2021-07-07 NOTE — Progress Notes (Addendum)
[facility] MD Progress Note  07/07/2021 1:51 PM David Davila.  MRN:  [id number] Subjective: Follow-up for this patient with schizophrenia.  " I don't feel like talking" Per nursing - Pt pacing the halls, laughing and talking to himself. Pt denies SI/HI, A/V hallucinations, but clearly responding to  nternal stimuli. He stated a chicken was a reincarnated [place].Patient alert and oriented x 3 he is not interacting with peers in the day room appropriately, he denies SI/HI/AVH but appears responding to internal stimuli, he was appropriate with staff this evening and complaint with night time medication. No distress noted, 15 minutes safety checks maintained will continue to monitor Pt seen today in hi sroom, pt very guarded, blunted affect, sttses " I don't feel like talking", appears paranoid, denies any complaints, denies SI/HI Principal Problem: Schizophrenia, paranoid type (HCC) Diagnosis: Principal Problem:   Schizophrenia, paranoid type (HCC) Active Problems:   Noncompliance with medication regimen   TBI (traumatic brain injury)  Total Time spent with patient: 30 minutes  Past Psychiatric History: Long past history of psychotic disorder  Past Medical History:  Past Medical History:  Diagnosis Date   Acute psychosis (HCC)    Antisocial personality disorder (HCC)    Cannabis abuse    Past   Paranoid schizophrenia (HCC)    Schizoaffective disorder, bipolar type (HCC)    History reviewed. No pertinent surgical history. Family History: History reviewed. No pertinent family history. Family Psychiatric  History: See previous Social History:  Social History   Substance and Sexual Activity  Alcohol Use No     Social History   Substance and Sexual Activity  Drug Use Not Currently    Social History   Socioeconomic History   Marital status: Single    Spouse name: Not on file   Number of children: Not on file   Years of education: Not on file   Highest education level:  Not on file  Occupational History   Not on file  Tobacco Use   Smoking status: Every Day    Packs/day: 1.00    Types: Cigarettes   Smokeless tobacco: Never  Vaping Use   Vaping Use: Unknown  Substance and Sexual Activity   Alcohol use: No   Drug use: Not Currently   Sexual activity: Not Currently  Other Topics Concern   Not on file  Social History Narrative   Not on file   Social Determinants of Health   Financial Resource Strain: Not on file  Food Insecurity: Not on file  Transportation Needs: Not on file  Physical Activity: Not on file  Stress: Not on file  Social Connections: Not on file   Additional Social History:                         Sleep: Fair  Appetite:  Poor  Current Medications: Current Facility-Administered Medications  Medication Dose Route Frequency Provider Last Rate Last Admin   acetaminophen (TYLENOL) tablet 650 mg  650 mg Oral Q6H PRN Charm Rings, NP       alum & mag hydroxide-simeth (MAALOX/MYLANTA) 200-200-20 MG/5ML suspension 30 mL  30 mL Oral Q4H PRN Charm Rings, NP       atorvastatin (LIPITOR) tablet 10 mg  10 mg Oral q1800 Deal, Adonis Housekeeper, RPH   10 mg at 07/06/21 2100   benztropine (COGENTIN) tablet 1 mg  1 mg Oral BID Charm Rings, NP   1 mg at 07/07/21 [phone]  divalproex (DEPAKOTE) DR tablet 1,000 mg  1,000 mg Oral BH-q7a Lord, Jamison Y, NP   1,000 mg at 07/07/21 0800   haloperidol (HALDOL) tablet 10 mg  10 mg Oral BID Clapacs, John T, MD   10 mg at 07/07/21 0800   magnesium hydroxide (MILK OF MAGNESIA) suspension 30 mL  30 mL Oral Daily PRN Charm Rings, NP       mirtazapine (REMERON) tablet 15 mg  15 mg Oral QHS Charm Rings, NP   15 mg at 07/06/21 2150   OLANZapine (ZYPREXA) tablet 15 mg  15 mg Oral BID Clapacs, John T, MD   15 mg at 07/07/21 0800   traZODone (DESYREL) tablet 100 mg  100 mg Oral QHS PRN Charm Rings, NP   100 mg at 07/06/21 2150    Lab Results: No results found for this or any previous  visit (from the past 48 hour(s)).  Blood Alcohol level:  Lab Results  Component Value Date   ETH <10 06/14/2021   ETH <10 04/13/2021    Metabolic Disorder Labs: Lab Results  Component Value Date   HGBA1C 5.3 06/29/2021   MPG 105.41 06/29/2021   MPG 108.28 03/25/2018   No results found for: PROLACTIN Lab Results  Component Value Date   CHOL 210 (H) 06/29/2021   TRIG 74 06/29/2021   HDL 60 06/29/2021   CHOLHDL 3.5 06/29/2021   VLDL 15 06/29/2021   LDLCALC 135 (H) 06/29/2021   LDLCALC 125 (H) 03/25/2018    Physical Findings: AIMS: Facial and Oral Movements Muscles of Facial Expression: None, normal Lips and Perioral Area: None, normal Jaw: None, normal Tongue: None, normal,Extremity Movements Upper (arms, wrists, hands, fingers): None, normal Lower (legs, knees, ankles, toes): None, normal, Trunk Movements Neck, shoulders, hips: None, normal, Overall Severity Severity of abnormal movements (highest score from questions above): None, normal Incapacitation due to abnormal movements: None, normal Patient's awareness of abnormal movements (rate only patient's report): No Awareness, Dental Status Current problems with teeth and/or dentures?: No Does patient usually wear dentures?: No  CIWA:    COWS:     Musculoskeletal: Strength & Muscle Tone: within normal limits Gait & Station: normal Patient leans: N/A  Psychiatric Specialty Exam:  Presentation  General Appearance: Casual; Neat  Eye Contact:Fleeting  Speech:Garbled  Speech Volume:Decreased  Handedness:Right   Mood and Affect  Mood:Anxious; Irritable  Affect: blunted  Thought Process  Thought Processes:Disorganized; Irrevelant  Descriptions of Associations:Loose  Orientation:Partial  Thought Content:Paranoid Ideation; Illogical  History of Schizophrenia/Schizoaffective disorder:Yes  Duration of Psychotic Symptoms:Greater than six months  Hallucinations: talking to self at times Ideas of  Reference:Paranoia  Suicidal Thoughts:denies Homicidal Thoughts:No data recorded  Sensorium  Memory:Immediate Good  Judgment:Impaired  Insight:Poor   Executive Functions  Concentration:Poor  Attention Span:Poor  Recall:Fair  Fund of Knowledge:Fair  Language:Fair   Psychomotor Activity  Psychomotor Activity:No data recorded  Assets  Assets:Physical Health   Sleep  Sleep:No data recorded   Physical Exam: Physical Exam Vitals and nursing note reviewed.  Constitutional:      Appearance: Normal appearance.  HENT:     Head: Normocephalic and atraumatic.     Mouth/Throat:     Pharynx: Oropharynx is clear.  Eyes:     Pupils: Pupils are equal, round, and reactive to light.  Cardiovascular:     Rate and Rhythm: Normal rate and regular rhythm.  Pulmonary:     Effort: Pulmonary effort is normal.     Breath sounds: Normal breath sounds.  Abdominal:     General: Abdomen is flat.     Palpations: Abdomen is soft.  Musculoskeletal:        General: Normal range of motion.  Skin:    General: Skin is warm and dry.  Neurological:     General: No focal deficit present.     Mental Status: He is alert. Mental status is at baseline.  Psychiatric:        Attention and Perception: He is inattentive.        Mood and Affect: Affect is labile and inappropriate.        Speech: Speech is tangential.        Behavior: Behavior is agitated. Behavior is not aggressive.        Thought Content: Thought content is paranoid and delusional.        Cognition and Memory: Memory is impaired.   Review of Systems  Constitutional: Negative.   HENT: Negative.    Eyes: Negative.   Respiratory: Negative.    Cardiovascular: Negative.   Gastrointestinal: Negative.   Musculoskeletal: Negative.   Skin: Negative.   Neurological: Negative.   Psychiatric/Behavioral:  Negative for depression, hallucinations, memory loss, substance abuse and suicidal ideas. The patient is not nervous/anxious and  does not have insomnia.   Blood pressure 113/72, pulse 84, temperature 97.8 F (36.6 C), temperature source Oral, resp. rate 18, height 5\' 10"  (1.778 m), weight 55.8 kg, SpO2 97 %. Body mass index is 17.65 kg/m.   Treatment Plan Summary: Medication management and Plan given that I am not really making any communication with the patient and his symptoms do not seem to be improving I think that it is justified to switch tactics to forcing long-acting injectables.  Patient of course has refused these medicines.  I am going to put an order in for forced medicine with the long-acting injectable making it clear that I think the potential benefits outweigh the risks and that the patient in his current condition is unlikely to improve without forced medications to the point that he would be able to get out of the hospital and also that he remains a danger to himself and others while still psychotic.  We will need to get the doctor over the weekend tomorrow to sign off a second opinion on the forced medicine order and then it can be administered.  No change to other orders Depakote for mood Invega sustenna for psychosis-pt received 234mg  IM today, I agree with Dr. that potential benefits of giving antipsychotic outweigh  risk, considering his psychosis, paranoia, poor insight ,risk of agitation,   Pt also on po haldol for psychosis. EKG ordered for QTc , MD 07/07/2021, 1:51 PM Patient ID: David Davila., male   DOB: Oct 26, 1980, 40 y.o.   MRN: [id number]/[id number]

## 2021-07-07 NOTE — Progress Notes (Signed)
Patient compliant with medication, IM invega  234 mg given per Provider orders. Patient later stated that he took  the medication because it was "lethal injection" Patient was educated, no evidence of learning.   Denies SI/HI/A/VH. Continues to appear responding to internal stimuli. Support and encouragement provided as needed.

## 2021-07-07 NOTE — Progress Notes (Signed)
Patient alert and oriented x 3 he is not interacting with peers in the day room appropriately, he denies SI/HI/AVH but appears responding to internal stimuli, he was appropriate with staff this evening and complaint with night time medication. No distress noted, 15 minutes safety checks maintained will continue to monitor

## 2021-07-08 DIAGNOSIS — F200 Paranoid schizophrenia: Secondary | ICD-10-CM | POA: Diagnosis not present

## 2021-07-08 NOTE — Group Note (Signed)
LCSW Group Therapy Note  Group Date: 07/08/2021 Start Time: 1300 End Time: 1400   Type of Therapy and Topic:  Group Therapy - How To Cope with Nervousness about Discharge   Participation Level:  Did Not Attend   Description of Group This process group involved identification of patients' feelings about discharge. Some of them are scheduled to be discharged soon, while others are new admissions, but each of them was asked to share thoughts and feelings surrounding discharge from the hospital. One common theme was that they are excited at the prospect of going home, while another was that many of them are apprehensive about sharing why they were hospitalized. Patients were given the opportunity to discuss these feelings with their peers in preparation for discharge.  Therapeutic Goals  Patient will identify their overall feelings about pending discharge. Patient will think about how they might proactively address issues that they believe will once again arise once they get home (i.e. with parents). Patients will participate in discussion about having hope for change.   Summary of Patient Progress: Patient did not attend group despite encouraged participation.    Therapeutic Modalities Cognitive Behavioral Therapy   Marletta Lor 07/08/2021  2:35 PM

## 2021-07-08 NOTE — Plan of Care (Signed)
  Problem: Activity: Goal: Will verbalize the importance of balancing activity with adequate rest periods Outcome: Progressing   Problem: Education: Goal: Will be free of psychotic symptoms Outcome: Progressing Goal: Knowledge of the prescribed therapeutic regimen will improve Outcome: Progressing   Problem: Coping: Goal: Coping ability will improve Outcome: Progressing Goal: Will verbalize feelings Outcome: Progressing   Problem: Health Behavior/Discharge Planning: Goal: Compliance with prescribed medication regimen will improve Outcome: Progressing   Problem: Nutritional: Goal: Ability to achieve adequate nutritional intake will improve Outcome: Progressing   Problem: Role Relationship: Goal: Ability to communicate needs accurately will improve Outcome: Progressing Goal: Ability to interact with others will improve Outcome: Progressing   Problem: Safety: Goal: Ability to redirect hostility and anger into socially appropriate behaviors will improve Outcome: Progressing Goal: Ability to remain free from injury will improve Outcome: Progressing   Problem: Self-Care: Goal: Ability to participate in self-care as condition permits will improve Outcome: Progressing   Problem: Self-Concept: Goal: Will verbalize positive feelings about self Outcome: Progressing   Problem: Consults Goal: Concurrent Medical Patient Education Description: (See Patient Education Module for education specifics) Outcome: Progressing   Problem: [facility] Concurrent Medical Problem Goal: LTG-Pt will be physically stable and he/significant other Description: (Patient will be physically stable and he/significant other will be able to verbalize understanding of follow-up care and symptoms that would warrant further treatment) Outcome: Progressing Goal: STG-Vital signs will be within defined limits or stabilized Description: (STG- Vital signs will be within defined limits or stabilized for  individual) Outcome: Progressing Goal: STG-Compliance with medication and/or treatment as ordered Description: (STG-Compliance with medication and/or treatment as ordered by MD) Outcome: Progressing Goal: STG-Verbalize two symptoms that would warrant further Description: (STG-Verbalize two symptoms that would warrant further treatment) Outcome: Progressing Goal: STG-Patient will participate in management/stabilization Description: (STG-Patient will participate in management/stabilization of medical condition) Outcome: Progressing Goal: STG-Other (Specify): Description: STG-Other Concurrent Medical (Specify): Outcome: Progressing

## 2021-07-08 NOTE — Plan of Care (Signed)
Continues to pace with no aggressive behaviors. Remains preoccupied and guarded. Selectively mute. Received AM medications and ate breakfast. Safety monitored as expected.

## 2021-07-08 NOTE — Progress Notes (Signed)
Patient is isolative to self and guarded but cooperative with staff. Takes his medications without difficulty. Paces the unit quietly and does not interact with other patients or staff

## 2021-07-08 NOTE — Progress Notes (Signed)
[facility] MD Progress Note  07/08/2021 1:27 PM David Davila.  MRN:  [id number] Subjective: Follow-up for this patient with schizophrenia.  " I 'm ok" Per nursing - Patient is isolative to self and guarded but cooperative with staff. Takes his medications without difficulty. Paces the unit quietly and does not interact with other patients or staff  Pt seen today in his room, pt still guarded, blunted affect, appears paranoid, denies any complaints, admits not feeling safe , would not elaborate, pt vague about hallucination  Principal Problem: Schizophrenia, paranoid type (HCC) Diagnosis: Principal Problem:   Schizophrenia, paranoid type (HCC) Active Problems:   Noncompliance with medication regimen   TBI (traumatic brain injury)  Total Time spent with patient: 30 minutes  Past Psychiatric History: Long past history of psychotic disorder  Past Medical History:  Past Medical History:  Diagnosis Date   Acute psychosis (HCC)    Antisocial personality disorder (HCC)    Cannabis abuse    Past   Paranoid schizophrenia (HCC)    Schizoaffective disorder, bipolar type (HCC)    History reviewed. No pertinent surgical history. Family History: History reviewed. No pertinent family history. Family Psychiatric  History: See previous Social History:  Social History   Substance and Sexual Activity  Alcohol Use No     Social History   Substance and Sexual Activity  Drug Use Not Currently    Social History   Socioeconomic History   Marital status: Single    Spouse name: Not on file   Number of children: Not on file   Years of education: Not on file   Highest education level: Not on file  Occupational History   Not on file  Tobacco Use   Smoking status: Every Day    Packs/day: 1.00    Types: Cigarettes   Smokeless tobacco: Never  Vaping Use   Vaping Use: Unknown  Substance and Sexual Activity   Alcohol use: No   Drug use: Not Currently   Sexual activity: Not  Currently  Other Topics Concern   Not on file  Social History Narrative   Not on file   Social Determinants of Health   Financial Resource Strain: Not on file  Food Insecurity: Not on file  Transportation Needs: Not on file  Physical Activity: Not on file  Stress: Not on file  Social Connections: Not on file   Additional Social History:                         Sleep: Fair  Appetite:  Poor  Current Medications: Current Facility-Administered Medications  Medication Dose Route Frequency Provider Last Rate Last Admin   acetaminophen (TYLENOL) tablet 650 mg  650 mg Oral Q6H PRN Charm Rings, NP       alum & mag hydroxide-simeth (MAALOX/MYLANTA) 200-200-20 MG/5ML suspension 30 mL  30 mL Oral Q4H PRN Charm Rings, NP       atorvastatin (LIPITOR) tablet 10 mg  10 mg Oral q1800 Deal, Adonis Housekeeper, RPH   10 mg at 07/07/21 1957   benztropine (COGENTIN) tablet 1 mg  1 mg Oral BID Charm Rings, NP   1 mg at 07/08/21 0757   divalproex (DEPAKOTE) DR tablet 1,000 mg  1,000 mg Oral Blenda Mounts, NP   1,000 mg at 07/08/21 0757   haloperidol (HALDOL) tablet 10 mg  10 mg Oral BID Clapacs, Jackquline Denmark, MD   10 mg at 07/08/21 [phone]  magnesium hydroxide (MILK OF MAGNESIA) suspension 30 mL  30 mL Oral Daily PRN Charm Rings, NP       mirtazapine (REMERON) tablet 15 mg  15 mg Oral QHS Charm Rings, NP   15 mg at 07/07/21 2053   OLANZapine (ZYPREXA) tablet 15 mg  15 mg Oral BID Clapacs, Jackquline Denmark, MD   15 mg at 07/08/21 0757   traZODone (DESYREL) tablet 100 mg  100 mg Oral QHS PRN Charm Rings, NP   100 mg at 07/06/21 2150    Lab Results: No results found for this or any previous visit (from the past 48 hour(s)).  Blood Alcohol level:  Lab Results  Component Value Date   ETH <10 06/14/2021   ETH <10 04/13/2021    Metabolic Disorder Labs: Lab Results  Component Value Date   HGBA1C 5.3 06/29/2021   MPG 105.41 06/29/2021   MPG 108.28 03/25/2018   No results found  for: PROLACTIN Lab Results  Component Value Date   CHOL 210 (H) 06/29/2021   TRIG 74 06/29/2021   HDL 60 06/29/2021   CHOLHDL 3.5 06/29/2021   VLDL 15 06/29/2021   LDLCALC 135 (H) 06/29/2021   LDLCALC 125 (H) 03/25/2018    Physical Findings: AIMS: Facial and Oral Movements Muscles of Facial Expression: None, normal Lips and Perioral Area: None, normal Jaw: None, normal Tongue: None, normal,Extremity Movements Upper (arms, wrists, hands, fingers): None, normal Lower (legs, knees, ankles, toes): None, normal, Trunk Movements Neck, shoulders, hips: None, normal, Overall Severity Severity of abnormal movements (highest score from questions above): None, normal Incapacitation due to abnormal movements: None, normal Patient's awareness of abnormal movements (rate only patient's report): No Awareness, Dental Status Current problems with teeth and/or dentures?: No Does patient usually wear dentures?: No  CIWA:    COWS:     Musculoskeletal: Strength & Muscle Tone: within normal limits Gait & Station: normal Patient leans: N/A  Psychiatric Specialty Exam:  Presentation  General Appearance: Casual; Neat  Eye Contact:Fleeting  Speech: soft, slow, diosrganized Speech Volume:Decreased  Handedness:Right   Mood and Affect  Mood:Anxious; Irritable  Affect: blunted  Thought Process  Thought Processes:Disorganized; Irrevelant  Descriptions of Associations:Loose  Orientation:Partial  Thought Content:Paranoid Ideation; Illogical   " Dont feel safe" History of Schizophrenia/Schizoaffective disorder:Yes  Duration of Psychotic Symptoms:Greater than six months  Hallucinations: talking to self at times Ideas of Reference:Paranoia  Suicidal Thoughts:denies Homicidal Thoughts:No data recorded  Sensorium  Memory:Immediate Good  Judgment:Impaired  Insight:Poor   Executive Functions  Concentration:Poor  Attention Span:Poor  Recall:Fair  Fund of  Knowledge:Fair  Language:Fair   Psychomotor Activity  Psychomotor Activity:No data recorded  Assets  Assets:Physical Health   Sleep  Sleep:No data recorded   Physical Exam: Physical Exam Vitals and nursing note reviewed.  Constitutional:      Appearance: Normal appearance.  HENT:     Head: Normocephalic and atraumatic.     Mouth/Throat:     Pharynx: Oropharynx is clear.  Eyes:     Pupils: Pupils are equal, round, and reactive to light.  Cardiovascular:     Rate and Rhythm: Normal rate and regular rhythm.  Pulmonary:     Effort: Pulmonary effort is normal.     Breath sounds: Normal breath sounds.  Abdominal:     General: Abdomen is flat.     Palpations: Abdomen is soft.  Musculoskeletal:        General: Normal range of motion.  Skin:    General: Skin is  warm and dry.  Neurological:     General: No focal deficit present.     Mental Status: He is alert. Mental status is at baseline.  Psychiatric:        Attention and Perception: He is inattentive.        Mood and Affect: Affect is labile and inappropriate.        Speech: Speech is tangential.        Behavior: Behavior is agitated. Behavior is not aggressive.        Thought Content: Thought content is paranoid and delusional.        Cognition and Memory: Memory is impaired.   Review of Systems  Constitutional: Negative.   HENT: Negative.    Eyes: Negative.   Respiratory: Negative.    Cardiovascular: Negative.   Gastrointestinal: Negative.   Musculoskeletal: Negative.   Skin: Negative.   Neurological: Negative.   Psychiatric/Behavioral:  Negative for depression, hallucinations, memory loss, substance abuse and suicidal ideas. The patient is not nervous/anxious and does not have insomnia.   Blood pressure 113/72, pulse 84, temperature 97.8 F (36.6 C), temperature source Oral, resp. rate 18, height 5\' 10"  (1.778 m), weight 55.8 kg, SpO2 97 %. Body mass index is 17.65 kg/m.   Treatment Plan Summary:  Pt  psychotic , guarded, paranoid.  Depakote for mood Invega sustenna for psychosis-pt received 234mg  IM yesterday,  Pt also on po haldol for psychosis. EKG ordered for QTc , MD 07/08/2021, 1:27 PM Patient ID: David Sessions., male   DOB: 1981-05-16, 40 y.o.   MRN: 06/12/1980 Patient ID: David Borquez., male   DOB: 05/21/1981, 40 y.o.   MRN: [birth year]/1980

## 2021-07-09 DIAGNOSIS — F200 Paranoid schizophrenia: Secondary | ICD-10-CM | POA: Diagnosis not present

## 2021-07-09 NOTE — Plan of Care (Signed)
  Problem: Education: Goal: Knowledge of the prescribed therapeutic regimen will improve Outcome: Progressing   Problem: Coping: Goal: Coping ability will improve Outcome: Progressing   Problem: Self-Care: Goal: Ability to participate in self-care as condition permits will improve Outcome: Not Progressing

## 2021-07-09 NOTE — Progress Notes (Addendum)
Patient has continued to pace but also was frequent in bed. He is taking medications and eating well. He is able to talk and request what he needs. No sign of distress.

## 2021-07-09 NOTE — Group Note (Signed)
[facility] LCSW Group Therapy Note    Group Date: 07/09/2021 Start Time: 1300 End Time: 1400  Type of Therapy and Topic:  Group Therapy:  Overcoming Obstacles  Participation Level:  [facility] PARTICIPATION LEVEL: Did Not Attend  Mood:  Description of Group:   In this group patients will be encouraged to explore what they see as obstacles to their own wellness and recovery. They will be guided to discuss their thoughts, feelings, and behaviors related to these obstacles. The group will process together ways to cope with barriers, with attention given to specific choices patients can make. Each patient will be challenged to identify changes they are motivated to make in order to overcome their obstacles. This group will be process-oriented, with patients participating in exploration of their own experiences as well as giving and receiving support and challenge from other group members.  Therapeutic Goals: 1. Patient will identify personal and current obstacles as they relate to admission. 2. Patient will identify barriers that currently interfere with their wellness or overcoming obstacles.  3. Patient will identify feelings, thought process and behaviors related to these barriers. 4. Patient will identify two changes they are willing to make to overcome these obstacles:    Summary of Patient Progress   X   Therapeutic Modalities:   Cognitive Behavioral Therapy Solution Focused Therapy Motivational Interviewing Relapse Prevention Therapy   Harden Mo, LCSW

## 2021-07-09 NOTE — Progress Notes (Signed)
[facility] MD Progress Note  07/09/2021 4:30 PM David Davila.  MRN:  [id number] Subjective: Follow-up 40 year old man with schizophrenia.  Patient received long-acting Invega over the weekend.  Nurses report they noticed some improvement.  Patient came to my office and only wanted to talk about how he did not like taking the needle.  Continues to refuse to allow for a Depakote level.  Still not able to really engage in any conversation about his life outside the hospital. Principal Problem: Schizophrenia, paranoid type (HCC) Diagnosis: Principal Problem:   Schizophrenia, paranoid type (HCC) Active Problems:   Noncompliance with medication regimen   TBI (traumatic brain injury)  Total Time spent with patient: 30 minutes  Past Psychiatric History: Past history of schizophrenia and multiple prior hospitalizations  Past Medical History:  Past Medical History:  Diagnosis Date   Acute psychosis (HCC)    Antisocial personality disorder (HCC)    Cannabis abuse    Past   Paranoid schizophrenia (HCC)    Schizoaffective disorder, bipolar type (HCC)    History reviewed. No pertinent surgical history. Family History: History reviewed. No pertinent family history. Family Psychiatric  History: See previous Social History:  Social History   Substance and Sexual Activity  Alcohol Use No     Social History   Substance and Sexual Activity  Drug Use Not Currently    Social History   Socioeconomic History   Marital status: Single    Spouse name: Not on file   Number of children: Not on file   Years of education: Not on file   Highest education level: Not on file  Occupational History   Not on file  Tobacco Use   Smoking status: Every Day    Packs/day: 1.00    Types: Cigarettes   Smokeless tobacco: Never  Vaping Use   Vaping Use: Unknown  Substance and Sexual Activity   Alcohol use: No   Drug use: Not Currently   Sexual activity: Not Currently  Other Topics Concern   Not  on file  Social History Narrative   Not on file   Social Determinants of Health   Financial Resource Strain: Not on file  Food Insecurity: Not on file  Transportation Needs: Not on file  Physical Activity: Not on file  Stress: Not on file  Social Connections: Not on file   Additional Social History:                         Sleep: Fair  Appetite:  Fair  Current Medications: Current Facility-Administered Medications  Medication Dose Route Frequency Provider Last Rate Last Admin   acetaminophen (TYLENOL) tablet 650 mg  650 mg Oral Q6H PRN Charm Rings, NP       alum & mag hydroxide-simeth (MAALOX/MYLANTA) 200-200-20 MG/5ML suspension 30 mL  30 mL Oral Q4H PRN Charm Rings, NP       atorvastatin (LIPITOR) tablet 10 mg  10 mg Oral q1800 Deal, Adonis Housekeeper, RPH   10 mg at 07/07/21 1957   benztropine (COGENTIN) tablet 1 mg  1 mg Oral BID Charm Rings, NP   1 mg at 07/09/21 0998   divalproex (DEPAKOTE) DR tablet 1,000 mg  1,000 mg Oral Blenda Mounts, NP   1,000 mg at 07/09/21 3382   haloperidol (HALDOL) tablet 10 mg  10 mg Oral BID Jerry Clyne, Jackquline Denmark, MD   10 mg at 07/09/21 5053   magnesium hydroxide (MILK OF MAGNESIA)  suspension 30 mL  30 mL Oral Daily PRN Charm Rings, NP       mirtazapine (REMERON) tablet 15 mg  15 mg Oral QHS Charm Rings, NP   15 mg at 07/08/21 2055   OLANZapine (ZYPREXA) tablet 15 mg  15 mg Oral BID Hiawatha Merriott, Jackquline Denmark, MD   15 mg at 07/09/21 0820   traZODone (DESYREL) tablet 100 mg  100 mg Oral QHS PRN Charm Rings, NP   100 mg at 07/06/21 2150    Lab Results: No results found for this or any previous visit (from the past 48 hour(s)).  Blood Alcohol level:  Lab Results  Component Value Date   ETH <10 06/14/2021   ETH <10 04/13/2021    Metabolic Disorder Labs: Lab Results  Component Value Date   HGBA1C 5.3 06/29/2021   MPG 105.41 06/29/2021   MPG 108.28 03/25/2018   No results found for: PROLACTIN Lab Results  Component  Value Date   CHOL 210 (H) 06/29/2021   TRIG 74 06/29/2021   HDL 60 06/29/2021   CHOLHDL 3.5 06/29/2021   VLDL 15 06/29/2021   LDLCALC 135 (H) 06/29/2021   LDLCALC 125 (H) 03/25/2018    Physical Findings: AIMS: Facial and Oral Movements Muscles of Facial Expression: None, normal Lips and Perioral Area: None, normal Jaw: None, normal Tongue: None, normal,Extremity Movements Upper (arms, wrists, hands, fingers): None, normal Lower (legs, knees, ankles, toes): None, normal, Trunk Movements Neck, shoulders, hips: None, normal, Overall Severity Severity of abnormal movements (highest score from questions above): None, normal Incapacitation due to abnormal movements: None, normal Patient's awareness of abnormal movements (rate only patient's report): No Awareness, Dental Status Current problems with teeth and/or dentures?: No Does patient usually wear dentures?: No  CIWA:    COWS:     Musculoskeletal: Strength & Muscle Tone: within normal limits Gait & Station: normal Patient leans: N/A  Psychiatric Specialty Exam:  Presentation  General Appearance: Casual; Neat  Eye Contact:Fleeting  Speech:Garbled  Speech Volume:Decreased  Handedness:Right   Mood and Affect  Mood:Anxious; Irritable  Affect:Constricted   Thought Process  Thought Processes:Disorganized; Irrevelant  Descriptions of Associations:Loose  Orientation:Partial  Thought Content:Paranoid Ideation; Illogical  History of Schizophrenia/Schizoaffective disorder:Yes  Duration of Psychotic Symptoms:Greater than six months  Hallucinations:No data recorded Ideas of Reference:Paranoia  Suicidal Thoughts:No data recorded Homicidal Thoughts:No data recorded  Sensorium  Memory:Immediate Good  Judgment:Impaired  Insight:Poor   Executive Functions  Concentration:Poor  Attention Span:Poor  Recall:Fair  Fund of Knowledge:Fair  Language:Fair   Psychomotor Activity  Psychomotor Activity:No  data recorded  Assets  Assets:Physical Health   Sleep  Sleep:No data recorded   Physical Exam: Physical Exam Vitals and nursing note reviewed.  Constitutional:      Appearance: Normal appearance.  HENT:     Head: Normocephalic and atraumatic.     Mouth/Throat:     Pharynx: Oropharynx is clear.  Eyes:     Pupils: Pupils are equal, round, and reactive to light.  Cardiovascular:     Rate and Rhythm: Normal rate and regular rhythm.  Pulmonary:     Effort: Pulmonary effort is normal.     Breath sounds: Normal breath sounds.  Abdominal:     General: Abdomen is flat.     Palpations: Abdomen is soft.  Musculoskeletal:        General: Normal range of motion.  Skin:    General: Skin is warm and dry.  Neurological:     General: No focal deficit  present.     Mental Status: He is alert. Mental status is at baseline.  Psychiatric:        Attention and Perception: He is inattentive.        Mood and Affect: Mood normal. Affect is blunt.        Speech: He is noncommunicative.        Behavior: Behavior is slowed.        Thought Content: Thought content is paranoid.        Cognition and Memory: Cognition is impaired.        Judgment: Judgment is inappropriate.   Review of Systems  Constitutional: Negative.   HENT: Negative.    Eyes: Negative.   Respiratory: Negative.    Cardiovascular: Negative.   Gastrointestinal: Negative.   Musculoskeletal: Negative.   Skin: Negative.   Neurological: Negative.   Psychiatric/Behavioral: Negative.    Blood pressure 113/72, pulse 84, temperature 97.8 F (36.6 C), temperature source Oral, resp. rate 18, height 5\' 10"  (1.778 m), weight 55.8 kg, SpO2 97 %. Body mass index is 17.65 kg/m.   Treatment Plan Summary: Medication management and Plan continue current medication treatment.  Ongoing reassessment.  May be starting to get closer to baseline.  , MD 07/09/2021, 4:30 PM

## 2021-07-09 NOTE — Progress Notes (Signed)
Patient is isolative to his room, but does come out to eat snack.  He has minimal interaction with both staff and other patients on the unit. He requested his QHS meds right after eating snack. He refuses his cholesterol medication stating that he does not eat red meat and feels like he does not need the medication. He denies si/hi/avh/depression and anxiety. Presented paranoid and anxious during conversation. Will continue to monitor with Q15 minute safety checks.      Cleo Butler-Nicholson, LPN

## 2021-07-09 NOTE — Plan of Care (Signed)
Patient is more approachable today and has been selectively talking to staff. More pleasant and taking medications. Continues to pace but has no aggressive behavior. Eating well and has no sleeping problem. Safety precautions maintained.

## 2021-07-10 DIAGNOSIS — F200 Paranoid schizophrenia: Secondary | ICD-10-CM | POA: Diagnosis not present

## 2021-07-10 NOTE — Progress Notes (Signed)
[facility] MD Progress Note  07/10/2021 3:01 PM David Davila.  MRN:  [id number] Subjective: Patient seen chart reviewed.  Patient has been walking around the ward most of the day.  Does not talk too much but does interact with some staff members.  Taking care of basic ADLs.  On interview with me he has no complaints.  He does request that his diet be changed to a vegetarian with no eggs and I have put in an order for that. Principal Problem: Schizophrenia, paranoid type (HCC) Diagnosis: Principal Problem:   Schizophrenia, paranoid type (HCC) Active Problems:   Noncompliance with medication regimen   TBI (traumatic brain injury)  Total Time spent with patient: 30 minutes  Past Psychiatric History: Past history of schizophrenia multiple prior hospitalizations.  See previous notes  Past Medical History:  Past Medical History:  Diagnosis Date   Acute psychosis (HCC)    Antisocial personality disorder (HCC)    Cannabis abuse    Past   Paranoid schizophrenia (HCC)    Schizoaffective disorder, bipolar type (HCC)    History reviewed. No pertinent surgical history. Family History: History reviewed. No pertinent family history. Family Psychiatric  History: See previous Social History:  Social History   Substance and Sexual Activity  Alcohol Use No     Social History   Substance and Sexual Activity  Drug Use Not Currently    Social History   Socioeconomic History   Marital status: Single    Spouse name: Not on file   Number of children: Not on file   Years of education: Not on file   Highest education level: Not on file  Occupational History   Not on file  Tobacco Use   Smoking status: Every Day    Packs/day: 1.00    Types: Cigarettes   Smokeless tobacco: Never  Vaping Use   Vaping Use: Unknown  Substance and Sexual Activity   Alcohol use: No   Drug use: Not Currently   Sexual activity: Not Currently  Other Topics Concern   Not on file  Social History  Narrative   Not on file   Social Determinants of Health   Financial Resource Strain: Not on file  Food Insecurity: Not on file  Transportation Needs: Not on file  Physical Activity: Not on file  Stress: Not on file  Social Connections: Not on file   Additional Social History:                         Sleep: Fair  Appetite:  Fair  Current Medications: Current Facility-Administered Medications  Medication Dose Route Frequency Provider Last Rate Last Admin   acetaminophen (TYLENOL) tablet 650 mg  650 mg Oral Q6H PRN Charm Rings, NP       alum & mag hydroxide-simeth (MAALOX/MYLANTA) 200-200-20 MG/5ML suspension 30 mL  30 mL Oral Q4H PRN Charm Rings, NP       atorvastatin (LIPITOR) tablet 10 mg  10 mg Oral q1800 Deal, Adonis Housekeeper, RPH   10 mg at 07/09/21 2103   benztropine (COGENTIN) tablet 1 mg  1 mg Oral BID Charm Rings, NP   1 mg at 07/10/21 0815   divalproex (DEPAKOTE) DR tablet 1,000 mg  1,000 mg Oral Blenda Mounts, NP   1,000 mg at 07/10/21 2703   haloperidol (HALDOL) tablet 10 mg  10 mg Oral BID Claud Gowan, Jackquline Denmark, MD   10 mg at 07/10/21 [phone]  magnesium hydroxide (MILK OF MAGNESIA) suspension 30 mL  30 mL Oral Daily PRN Charm Rings, NP       mirtazapine (REMERON) tablet 15 mg  15 mg Oral QHS Charm Rings, NP   15 mg at 07/09/21 2102   OLANZapine (ZYPREXA) tablet 15 mg  15 mg Oral BID Keisa Blow, Jackquline Denmark, MD   15 mg at 07/10/21 0814   traZODone (DESYREL) tablet 100 mg  100 mg Oral QHS PRN Charm Rings, NP   100 mg at 07/09/21 2101    Lab Results: No results found for this or any previous visit (from the past 48 hour(s)).  Blood Alcohol level:  Lab Results  Component Value Date   ETH <10 06/14/2021   ETH <10 04/13/2021    Metabolic Disorder Labs: Lab Results  Component Value Date   HGBA1C 5.3 06/29/2021   MPG 105.41 06/29/2021   MPG 108.28 03/25/2018   No results found for: PROLACTIN Lab Results  Component Value Date   CHOL 210 (H)  06/29/2021   TRIG 74 06/29/2021   HDL 60 06/29/2021   CHOLHDL 3.5 06/29/2021   VLDL 15 06/29/2021   LDLCALC 135 (H) 06/29/2021   LDLCALC 125 (H) 03/25/2018    Physical Findings: AIMS: Facial and Oral Movements Muscles of Facial Expression: None, normal Lips and Perioral Area: None, normal Jaw: None, normal Tongue: None, normal,Extremity Movements Upper (arms, wrists, hands, fingers): None, normal Lower (legs, knees, ankles, toes): None, normal, Trunk Movements Neck, shoulders, hips: None, normal, Overall Severity Severity of abnormal movements (highest score from questions above): None, normal Incapacitation due to abnormal movements: None, normal Patient's awareness of abnormal movements (rate only patient's report): No Awareness, Dental Status Current problems with teeth and/or dentures?: No Does patient usually wear dentures?: No  CIWA:    COWS:     Musculoskeletal: Strength & Muscle Tone: within normal limits Gait & Station: normal Patient leans: N/A  Psychiatric Specialty Exam:  Presentation  General Appearance: Casual; Neat  Eye Contact:Fleeting  Speech:Garbled  Speech Volume:Decreased  Handedness:Right   Mood and Affect  Mood:Anxious; Irritable  Affect:Constricted   Thought Process  Thought Processes:Disorganized; Irrevelant  Descriptions of Associations:Loose  Orientation:Partial  Thought Content:Paranoid Ideation; Illogical  History of Schizophrenia/Schizoaffective disorder:Yes  Duration of Psychotic Symptoms:Greater than six months  Hallucinations:No data recorded Ideas of Reference:Paranoia  Suicidal Thoughts:No data recorded Homicidal Thoughts:No data recorded  Sensorium  Memory:Immediate Good  Judgment:Impaired  Insight:Poor   Executive Functions  Concentration:Poor  Attention Span:Poor  Recall:Fair  Fund of Knowledge:Fair  Language:Fair   Psychomotor Activity  Psychomotor Activity:No data recorded  Assets   Assets:Physical Health   Sleep  Sleep:No data recorded   Physical Exam: Physical Exam Vitals and nursing note reviewed.  Constitutional:      Appearance: Normal appearance.  HENT:     Head: Normocephalic and atraumatic.     Mouth/Throat:     Pharynx: Oropharynx is clear.  Eyes:     Pupils: Pupils are equal, round, and reactive to light.  Cardiovascular:     Rate and Rhythm: Normal rate and regular rhythm.  Pulmonary:     Effort: Pulmonary effort is normal.     Breath sounds: Normal breath sounds.  Abdominal:     General: Abdomen is flat.     Palpations: Abdomen is soft.  Musculoskeletal:        General: Normal range of motion.  Skin:    General: Skin is warm and dry.  Neurological:  General: No focal deficit present.     Mental Status: He is alert. Mental status is at baseline.  Psychiatric:        Attention and Perception: He is inattentive.        Mood and Affect: Mood normal. Affect is blunt.        Speech: Speech is delayed.        Thought Content: Thought content normal.   Review of Systems  Constitutional: Negative.   HENT: Negative.    Eyes: Negative.   Respiratory: Negative.    Cardiovascular: Negative.   Gastrointestinal: Negative.   Musculoskeletal: Negative.   Skin: Negative.   Neurological: Negative.   Psychiatric/Behavioral: Negative.    Blood pressure 113/72, pulse 84, temperature 97.8 F (36.6 C), temperature source Oral, resp. rate 18, height 5\' 10"  (1.778 m), weight 55.8 kg, SpO2 97 %. Body mass index is 17.65 kg/m.   Treatment Plan Summary: Medication management and Plan I am hoping that he may be getting close to his baseline although his baseline still has some significant cognitive impairment.  No change to psychiatric medicine today.  I did make the change to a vegetarian diet with no eggs and made it clear to him that I was glad to do this for him.  We will continue reassessment and may be able to look towards discharge relatively  soon  , MD 07/10/2021, 3:01 PM

## 2021-07-10 NOTE — Progress Notes (Signed)
Patient is pleasant and cooperative.  He is more clear and logical with his thoughts and speech. Reports that he "would prefer to have a vegetarian diet, but no one was willing to listen to me".  Declined the chance to fill out a food menu. Stating "too late to start caring now" Took his medication as "that is what ya'll expect of me"    Cleo Butler-Nicholson, LPN

## 2021-07-10 NOTE — Progress Notes (Signed)
Patient presents with an improved affect from when this writer had him earlier this admission. Pt isolated to his room but stated that he was just feeling tired. Pt did come out for snack and was compliant with medication administration per MD orders. Pt given education, support, and encouragement to be active in his treatment plan. Pt being monitored Q 15 minutes for safety per unit protocol. Pt remains safe on the unit.

## 2021-07-10 NOTE — B[facility] Counselor (Signed)
CSW spoke with the patient's guardian and provided update on patient's care.  CSW explained that patient remains in the clothing that he arrived in and declines to change.  CSW informed that she is unaware of an odor from patient if there is one.  CSW also requested any information requesting that patient be discharged to police custody.  Guardian reports that he will send the requested information.   legal guardian, [employer]ne [employer] [facility]) [phone]  Penni Homans, MSW, LCSW 07/10/2021 1:30 PM

## 2021-07-10 NOTE — Plan of Care (Signed)
Patient stayed in bed most of the shift. Patient refused EKG. States " I don't need to do that. I am tired." Denies SI,HI and AVH. Compliant with medications. Patient did not talk to staff today. Support and encouragement given.

## 2021-07-10 NOTE — BH IP Treatment Plan (Signed)
Interdisciplinary Treatment and Diagnostic Plan Update  07/10/2021 Time of Session: 8:30 AM David Davila. MRN: [id number]  Principal Diagnosis: Schizophrenia, paranoid type (HCC)  Secondary Diagnoses: Principal Problem:   Schizophrenia, paranoid type (HCC) Active Problems:   Noncompliance with medication regimen   TBI (traumatic brain injury)   Current Medications:  Current Facility-Administered Medications  Medication Dose Route Frequency Provider Last Rate Last Admin   acetaminophen (TYLENOL) tablet 650 mg  650 mg Oral Q6H PRN Charm Rings, NP       alum & mag hydroxide-simeth (MAALOX/MYLANTA) 200-200-20 MG/5ML suspension 30 mL  30 mL Oral Q4H PRN Charm Rings, NP       atorvastatin (LIPITOR) tablet 10 mg  10 mg Oral q1800 Deal, Adonis Housekeeper, RPH   10 mg at 07/09/21 2103   benztropine (COGENTIN) tablet 1 mg  1 mg Oral BID Charm Rings, NP   1 mg at 07/10/21 0815   divalproex (DEPAKOTE) DR tablet 1,000 mg  1,000 mg Oral Blenda Mounts, NP   1,000 mg at 07/10/21 8828   haloperidol (HALDOL) tablet 10 mg  10 mg Oral BID Clapacs, Jackquline Denmark, MD   10 mg at 07/10/21 0034   magnesium hydroxide (MILK OF MAGNESIA) suspension 30 mL  30 mL Oral Daily PRN Charm Rings, NP       mirtazapine (REMERON) tablet 15 mg  15 mg Oral QHS Charm Rings, NP   15 mg at 07/09/21 2102   OLANZapine (ZYPREXA) tablet 15 mg  15 mg Oral BID Clapacs, Jackquline Denmark, MD   15 mg at 07/10/21 0814   traZODone (DESYREL) tablet 100 mg  100 mg Oral QHS PRN Charm Rings, NP   100 mg at 07/09/21 2101   PTA Medications: Medications Prior to Admission  Medication Sig Dispense Refill Last Dose   atorvastatin (LIPITOR) 10 MG tablet Take 1 tablet (10 mg total) by mouth daily at 6 PM. 30 tablet 0    benztropine (COGENTIN) 1 MG tablet Take 1 tablet (1 mg total) by mouth 2 (two) times daily. 60 tablet 0    divalproex (DEPAKOTE) 500 MG DR tablet Take 2 tablets (1,000 mg total) by mouth every morning. 60  tablet 0    haloperidol (HALDOL) 5 MG tablet Take 1 tablet (5 mg total) by mouth at bedtime. 30 tablet 0    mirtazapine (REMERON) 15 MG tablet Take 1 tablet (15 mg total) by mouth at bedtime. 30 tablet 0    OLANZapine (ZYPREXA) 10 MG tablet Take 1 tablet (10 mg total) by mouth 2 (two) times daily. 60 tablet 0    paliperidone (INVEGA SUSTENNA) 156 MG/ML SUSY injection Inject 1 mL (156 mg total) into the muscle once for 1 dose. (Patient not taking: Reported on 02/06/2021) 1 mL 0    paliperidone (INVEGA SUSTENNA) 234 MG/1.5ML SUSY injection Inject 234 mg into the muscle once for 1 dose. Next injection due 05/27/18 (Patient not taking: Reported on 02/06/2021) 1.5 mL 0    paliperidone (INVEGA) 6 MG 24 hr tablet Take 1 tablet (6 mg total) by mouth daily. (Patient not taking: No sig reported) 30 tablet 0    traZODone (DESYREL) 100 MG tablet Take 1 tablet (100 mg total) by mouth at bedtime as needed for sleep. (Patient not taking: Reported on 06/14/2021) 30 tablet 0     Patient Stressors: Financial difficulties   Medication change or noncompliance    Patient Strengths: Motivation for treatment/growth   Treatment  Modalities: Medication Management, Group therapy, Case management,  1 to 1 session with clinician, Psychoeducation, Recreational therapy.   Physician Treatment Plan for Primary Diagnosis: Schizophrenia, paranoid type (HCC) Long Term Goal(s): Improvement in symptoms so as ready for discharge   Short Term Goals: Ability to demonstrate self-control will improve Ability to maintain clinical measurements within normal limits will improve Compliance with prescribed medications will improve Ability to disclose and discuss suicidal ideas Ability to identify and develop effective coping behaviors will improve  Medication Management: Evaluate patient's response, side effects, and tolerance of medication regimen.  Therapeutic Interventions: 1 to 1 sessions, Unit Group sessions and Medication  administration.  Evaluation of Outcomes: Progressing  Physician Treatment Plan for Secondary Diagnosis: Principal Problem:   Schizophrenia, paranoid type (HCC) Active Problems:   Noncompliance with medication regimen   TBI (traumatic brain injury)  Long Term Goal(s): Improvement in symptoms so as ready for discharge   Short Term Goals: Ability to demonstrate self-control will improve Ability to maintain clinical measurements within normal limits will improve Compliance with prescribed medications will improve Ability to disclose and discuss suicidal ideas Ability to identify and develop effective coping behaviors will improve     Medication Management: Evaluate patient's response, side effects, and tolerance of medication regimen.  Therapeutic Interventions: 1 to 1 sessions, Unit Group sessions and Medication administration.  Evaluation of Outcomes: Progressing   RN Treatment Plan for Primary Diagnosis: Schizophrenia, paranoid type (HCC) Long Term Goal(s): Knowledge of disease and therapeutic regimen to maintain health will improve  Short Term Goals: Ability to remain free from injury will improve, Ability to verbalize frustration and anger appropriately will improve, Ability to demonstrate self-control, Ability to participate in decision making will improve, Ability to verbalize feelings will improve, Ability to disclose and discuss suicidal ideas, Ability to identify and develop effective coping behaviors will improve, and Compliance with prescribed medications will improve  Medication Management: RN will administer medications as ordered by provider, will assess and evaluate patient's response and provide education to patient for prescribed medication. RN will report any adverse and/or side effects to prescribing provider.  Therapeutic Interventions: 1 on 1 counseling sessions, Psychoeducation, Medication administration, Evaluate responses to treatment, Monitor vital signs and CBGs  as ordered, Perform/monitor CIWA, COWS, AIMS and Fall Risk screenings as ordered, Perform wound care treatments as ordered.  Evaluation of Outcomes: Progressing   LCSW Treatment Plan for Primary Diagnosis: Schizophrenia, paranoid type (HCC) Long Term Goal(s): Safe transition to appropriate next level of care at discharge, Engage patient in therapeutic group addressing interpersonal concerns.  Short Term Goals: Engage patient in aftercare planning with referrals and resources, Increase social support, Increase ability to appropriately verbalize feelings, Increase emotional regulation, Facilitate acceptance of mental health diagnosis and concerns, and Increase skills for wellness and recovery  Therapeutic Interventions: Assess for all discharge needs, 1 to 1 time with Social worker, Explore available resources and support systems, Assess for adequacy in community support network, Educate family and significant other(s) on suicide prevention, Complete Psychosocial Assessment, Interpersonal group therapy.  Evaluation of Outcomes: Progressing   Progress in Treatment: Attending groups: No. Participating in groups: No. Taking medication as prescribed: No. Toleration medication: Yes. Family/Significant other contact made: Yes, individual(s) contacted:  patient's guardian. Patient understands diagnosis: No. Discussing patient identified problems/goals with staff: No. Medical problems stabilized or resolved: Yes. Denies suicidal/homicidal ideation: Yes. Issues/concerns per patient self-inventory: No. Other: none  New Short Term/Long Term Goal(s): elimination of symptoms of psychosis, medication management for mood stabilization; elimination of  SI thoughts; development of comprehensive mental wellness/sobriety plan. Update 06/30/2021: No changes at this time. Update 07/06/21: No changes at this time. Update 07/10/21: No changes at this time.   Patient Goals: "I don't know what they brought me to  the hospital for." Update 06/30/2021: No changes at this time. Update 07/06/21: No changes at this time. Update 07/10/21: No changes at this time.   Discharge Plan or Barriers: CSW will assist pt/guardian with development of an appropriate aftercare/discharge plan.06/30/2021: No changes at this time. Update 07/06/21: No changes at this time. Update 07/10/21: No changes at this time.     Reason for Continuation of Hospitalization: Delusions  Medication stabilization   Estimated Length of Stay: TBD   Scribe for Treatment Team: Glenis Smoker, LCSW 07/10/2021 8:59 AM

## 2021-07-10 NOTE — Group Note (Signed)
[facility] LCSW Group Therapy Note   Group Date: 07/10/2021 Start Time: 1300 End Time: 1400  Type of Therapy/Topic:  Group Therapy:  Feelings about Diagnosis  Participation Level:  Did Not Attend     Description of Group:    This group will allow patients to explore their thoughts and feelings about diagnoses they have received. Patients will be guided to explore their level of understanding and acceptance of these diagnoses. Facilitator will encourage patients to process their thoughts and feelings about the reactions of others to their diagnosis, and will guide patients in identifying ways to discuss their diagnosis with significant others in their lives. This group will be process-oriented, with patients participating in exploration of their own experiences as well as giving and receiving support and challenge from other group members.   Therapeutic Goals: 1. Patient will demonstrate understanding of diagnosis as evidence by identifying two or more symptoms of the disorder:  2. Patient will be able to express two feelings regarding the diagnosis 3. Patient will demonstrate ability to communicate their needs through discussion and/or role plays  Summary of Patient Progress:    X    Therapeutic Modalities:   Cognitive Behavioral Therapy Brief Therapy Feelings Identification    Starleen Arms, Student-Social Work

## 2021-07-10 NOTE — Progress Notes (Signed)
Recreation Therapy Notes   Date: 07/10/2021  Time: 9:45 am     Location: Craft room   Behavioral response: N/A   Intervention Topic: Stress Management    Discussion/Intervention: Patient did not attend group.   Clinical Observations/Feedback:  Patient did not attend group.   Aundrea Higginbotham LRT/CTRS        Toma Arts 07/10/2021 11:43 AM

## 2021-07-11 DIAGNOSIS — F200 Paranoid schizophrenia: Secondary | ICD-10-CM | POA: Diagnosis not present

## 2021-07-11 NOTE — Progress Notes (Signed)
Patient presents with an improved affect from when this writer had him earlier this admission. Pt more active on the unit tonight, pt observed sitting in the dayroom and coming up for snack.. Pt did come out for snack and was compliant with medication administration per MD orders. Pt given education, support, and encouragement to be active in his treatment plan. Pt being monitored Q 15 minutes for safety per unit protocol. Pt remains safe on the unit.

## 2021-07-11 NOTE — Progress Notes (Signed)
[facility] MD Progress Note  07/11/2021 5:39 PM David Davila.  MRN:  [id number] Subjective: Follow-up for this 40 year old man with schizophrenia.  Patient seen.  Patient was actually calmer and much more appropriate today.  Made eye contact and told me he was feeling okay.  The only thing he wanted this to discussed was his diet.  He wants to change to a vegan.  I told him that was completely fine not a problem.  Behavior has been pretty calm.  More appropriately interactive.  No sign of side effects Principal Problem: Schizophrenia, paranoid type (HCC) Diagnosis: Principal Problem:   Schizophrenia, paranoid type (HCC) Active Problems:   Noncompliance with medication regimen   TBI (traumatic brain injury)  Total Time spent with patient: 30 minutes  Past Psychiatric History: Past history of schizophrenia  Past Medical History:  Past Medical History:  Diagnosis Date   Acute psychosis (HCC)    Antisocial personality disorder (HCC)    Cannabis abuse    Past   Paranoid schizophrenia (HCC)    Schizoaffective disorder, bipolar type (HCC)    History reviewed. No pertinent surgical history. Family History: History reviewed. No pertinent family history. Family Psychiatric  History: See previous Social History:  Social History   Substance and Sexual Activity  Alcohol Use No     Social History   Substance and Sexual Activity  Drug Use Not Currently    Social History   Socioeconomic History   Marital status: Single    Spouse name: Not on file   Number of children: Not on file   Years of education: Not on file   Highest education level: Not on file  Occupational History   Not on file  Tobacco Use   Smoking status: Every Day    Packs/day: 1.00    Types: Cigarettes   Smokeless tobacco: Never  Vaping Use   Vaping Use: Unknown  Substance and Sexual Activity   Alcohol use: No   Drug use: Not Currently   Sexual activity: Not Currently  Other Topics Concern   Not on  file  Social History Narrative   Not on file   Social Determinants of Health   Financial Resource Strain: Not on file  Food Insecurity: Not on file  Transportation Needs: Not on file  Physical Activity: Not on file  Stress: Not on file  Social Connections: Not on file   Additional Social History:                         Sleep: Fair  Appetite:  Fair  Current Medications: Current Facility-Administered Medications  Medication Dose Route Frequency Provider Last Rate Last Admin   acetaminophen (TYLENOL) tablet 650 mg  650 mg Oral Q6H PRN Charm Rings, NP       alum & mag hydroxide-simeth (MAALOX/MYLANTA) 200-200-20 MG/5ML suspension 30 mL  30 mL Oral Q4H PRN Charm Rings, NP       atorvastatin (LIPITOR) tablet 10 mg  10 mg Oral q1800 Deal, Adonis Housekeeper, RPH   10 mg at 07/10/21 2115   benztropine (COGENTIN) tablet 1 mg  1 mg Oral BID Charm Rings, NP   1 mg at 07/11/21 1700   divalproex (DEPAKOTE) DR tablet 1,000 mg  1,000 mg Oral Blenda Mounts, NP   1,000 mg at 07/11/21 3235   haloperidol (HALDOL) tablet 10 mg  10 mg Oral BID Dorma Altman, Jackquline Denmark, MD   10 mg at 07/11/21  1700   magnesium hydroxide (MILK OF MAGNESIA) suspension 30 mL  30 mL Oral Daily PRN Charm Rings, NP       mirtazapine (REMERON) tablet 15 mg  15 mg Oral QHS Charm Rings, NP   15 mg at 07/10/21 2115   OLANZapine (ZYPREXA) tablet 15 mg  15 mg Oral BID Laporsche Hoeger T, MD   15 mg at 07/11/21 1700   traZODone (DESYREL) tablet 100 mg  100 mg Oral QHS PRN Charm Rings, NP   100 mg at 07/10/21 2115    Lab Results: No results found for this or any previous visit (from the past 48 hour(s)).  Blood Alcohol level:  Lab Results  Component Value Date   ETH <10 06/14/2021   ETH <10 04/13/2021    Metabolic Disorder Labs: Lab Results  Component Value Date   HGBA1C 5.3 06/29/2021   MPG 105.41 06/29/2021   MPG 108.28 03/25/2018   No results found for: PROLACTIN Lab Results  Component Value  Date   CHOL 210 (H) 06/29/2021   TRIG 74 06/29/2021   HDL 60 06/29/2021   CHOLHDL 3.5 06/29/2021   VLDL 15 06/29/2021   LDLCALC 135 (H) 06/29/2021   LDLCALC 125 (H) 03/25/2018    Physical Findings: AIMS: Facial and Oral Movements Muscles of Facial Expression: None, normal Lips and Perioral Area: None, normal Jaw: None, normal Tongue: None, normal,Extremity Movements Upper (arms, wrists, hands, fingers): None, normal Lower (legs, knees, ankles, toes): None, normal, Trunk Movements Neck, shoulders, hips: None, normal, Overall Severity Severity of abnormal movements (highest score from questions above): None, normal Incapacitation due to abnormal movements: None, normal Patient's awareness of abnormal movements (rate only patient's report): No Awareness, Dental Status Current problems with teeth and/or dentures?: No Does patient usually wear dentures?: No  CIWA:    COWS:     Musculoskeletal: Strength & Muscle Tone: within normal limits Gait & Station: normal Patient leans: N/A  Psychiatric Specialty Exam:  Presentation  General Appearance: Casual; Neat  Eye Contact:Fleeting  Speech:Garbled  Speech Volume:Decreased  Handedness:Right   Mood and Affect  Mood:Anxious; Irritable  Affect:Constricted   Thought Process  Thought Processes:Disorganized; Irrevelant  Descriptions of Associations:Loose  Orientation:Partial  Thought Content:Paranoid Ideation; Illogical  History of Schizophrenia/Schizoaffective disorder:Yes  Duration of Psychotic Symptoms:Greater than six months  Hallucinations:No data recorded Ideas of Reference:Paranoia  Suicidal Thoughts:No data recorded Homicidal Thoughts:No data recorded  Sensorium  Memory:Immediate Good  Judgment:Impaired  Insight:Poor   Executive Functions  Concentration:Poor  Attention Span:Poor  Recall:Fair  Fund of Knowledge:Fair  Language:Fair   Psychomotor Activity  Psychomotor Activity:No data  recorded  Assets  Assets:Physical Health   Sleep  Sleep:No data recorded   Physical Exam: Physical Exam Vitals and nursing note reviewed.  Constitutional:      Appearance: Normal appearance.  HENT:     Head: Normocephalic and atraumatic.     Mouth/Throat:     Pharynx: Oropharynx is clear.  Eyes:     Pupils: Pupils are equal, round, and reactive to light.  Cardiovascular:     Rate and Rhythm: Normal rate and regular rhythm.  Pulmonary:     Effort: Pulmonary effort is normal.     Breath sounds: Normal breath sounds.  Abdominal:     General: Abdomen is flat.     Palpations: Abdomen is soft.  Musculoskeletal:        General: Normal range of motion.  Skin:    General: Skin is warm and dry.  Neurological:  General: No focal deficit present.     Mental Status: He is alert. Mental status is at baseline.  Psychiatric:        Attention and Perception: He is inattentive.        Mood and Affect: Mood normal. Affect is blunt.        Speech: Speech is delayed.        Behavior: Behavior is slowed.        Thought Content: Thought content normal.        Cognition and Memory: Cognition is impaired.   ROS Blood pressure 113/72, pulse 84, temperature 97.8 F (36.6 C), temperature source Oral, resp. rate 18, height 5\' 10"  (1.778 m), weight 55.8 kg, SpO2 97 %. Body mass index is 17.65 kg/m.   Treatment Plan Summary: Medication management and Plan improving.  We will do another injection this weekend.  Meanwhile continue engaging him and monitoring him daily.  Sadly when he is ready for discharge [facility].  , MD 07/11/2021, 5:39 PM

## 2021-07-11 NOTE — Plan of Care (Signed)
  Problem: Group Participation Goal: STG - Patient will engage in groups without prompting or encouragement from LRT x3 group sessions within 5 recreation therapy group sessions Description: STG - Patient will engage in groups without prompting or encouragement from LRT x3 group sessions within 5 recreation therapy group sessions Outcome: Not Progressing   

## 2021-07-11 NOTE — Plan of Care (Signed)
Patient isolates in his room most of the shift. Before taking the medicine patient states " I don't need any of these medicines." Patient denies SI,HI and AVH. Appetite and energy level good. ADLs maintained. Support and encouragement given.

## 2021-07-11 NOTE — Group Note (Signed)
[facility] LCSW Group Therapy Note   Group Date: 07/11/2021 Start Time: 1300 End Time: 1400   Type of Therapy/Topic:  Group Therapy:  Emotion Regulation  Participation Level:  Did Not Attend   Mood:  Description of Group:    The purpose of this group is to assist patients in learning to regulate negative emotions and experience positive emotions. Patients will be guided to discuss ways in which they have been vulnerable to their negative emotions. These vulnerabilities will be juxtaposed with experiences of positive emotions or situations, and patients challenged to use positive emotions to combat negative ones. Special emphasis will be placed on coping with negative emotions in conflict situations, and patients will process healthy conflict resolution skills.  Therapeutic Goals: Patient will identify two positive emotions or experiences to reflect on in order to balance out negative emotions:  Patient will label two or more emotions that they find the most difficult to experience:  Patient will be able to demonstrate positive conflict resolution skills through discussion or role plays:   Summary of Patient Progress:   Group not held due to acuity on unit.      Therapeutic Modalities:   Cognitive Behavioral Therapy Feelings Identification Dialectical Behavioral Therapy   Harden Mo, LCSW

## 2021-07-11 NOTE — Progress Notes (Signed)
Recreation Therapy Notes   Date: 07/11/2021  Time: 10:15 am     Location: Craft room    Behavioral response: N/A   Intervention Topic: Self-care   Discussion/Intervention: Patient did not attend group.   Clinical Observations/Feedback:  Patient did not attend group.   Elissia Spiewak LRT/CTRS        Skilar Marcou 07/11/2021 11:41 AM

## 2021-07-12 DIAGNOSIS — F200 Paranoid schizophrenia: Secondary | ICD-10-CM | POA: Diagnosis not present

## 2021-07-12 NOTE — Group Note (Signed)
[facility] LCSW Group Therapy Note   Group Date: 07/12/2021 Start Time: 1300 End Time: 1400   Type of Therapy/Topic:  Group Therapy:  Emotion Regulation  Participation Level:  Did Not Attend   Mood:  Description of Group:    The purpose of this group is to assist patients in learning to regulate negative emotions and experience positive emotions. Patients will be guided to discuss ways in which they have been vulnerable to their negative emotions. These vulnerabilities will be juxtaposed with experiences of positive emotions or situations, and patients challenged to use positive emotions to combat negative ones. Special emphasis will be placed on coping with negative emotions in conflict situations, and patients will process healthy conflict resolution skills.  Therapeutic Goals: Patient will identify two positive emotions or experiences to reflect on in order to balance out negative emotions:  Patient will label two or more emotions that they find the most difficult to experience:  Patient will be able to demonstrate positive conflict resolution skills through discussion or role plays:   Summary of Patient Progress: Patient did not attend group despite encouraged participation.     Therapeutic Modalities:   Cognitive Behavioral Therapy Feelings Identification Dialectical Behavioral Therapy   Braydyn Schultes W Anatasia Tino, LCSWA 

## 2021-07-12 NOTE — Progress Notes (Signed)
[facility] MD Progress Note  07/12/2021 4:06 PM David Davila.  MRN:  [id number] Subjective: Follow-up for this patient with schizophrenia.  Today he is again calm and not aggressive.  Only topic he wants to discuss Amalia Hailey is getting his vegan diet which apparently they are still not doing correctly.  No complaints about medication.  No aggression.  No bizarre behavior. Principal Problem: Schizophrenia, paranoid type (HCC) Diagnosis: Principal Problem:   Schizophrenia, paranoid type (HCC) Active Problems:   Noncompliance with medication regimen   TBI (traumatic brain injury)  Total Time spent with patient: 30 minutes  Past Psychiatric History: Past history of longstanding schizophrenia probably with cognitive limitations from it as well.  Past Medical History:  Past Medical History:  Diagnosis Date   Acute psychosis (HCC)    Antisocial personality disorder (HCC)    Cannabis abuse    Past   Paranoid schizophrenia (HCC)    Schizoaffective disorder, bipolar type (HCC)    History reviewed. No pertinent surgical history. Family History: History reviewed. No pertinent family history. Family Psychiatric  History: See previous Social History:  Social History   Substance and Sexual Activity  Alcohol Use No     Social History   Substance and Sexual Activity  Drug Use Not Currently    Social History   Socioeconomic History   Marital status: Single    Spouse name: Not on file   Number of children: Not on file   Years of education: Not on file   Highest education level: Not on file  Occupational History   Not on file  Tobacco Use   Smoking status: Every Day    Packs/day: 1.00    Types: Cigarettes   Smokeless tobacco: Never  Vaping Use   Vaping Use: Unknown  Substance and Sexual Activity   Alcohol use: No   Drug use: Not Currently   Sexual activity: Not Currently  Other Topics Concern   Not on file  Social History Narrative   Not on file   Social Determinants of  Health   Financial Resource Strain: Not on file  Food Insecurity: Not on file  Transportation Needs: Not on file  Physical Activity: Not on file  Stress: Not on file  Social Connections: Not on file   Additional Social History:                         Sleep: Fair  Appetite:  Poor  Current Medications: Current Facility-Administered Medications  Medication Dose Route Frequency Provider Last Rate Last Admin   acetaminophen (TYLENOL) tablet 650 mg  650 mg Oral Q6H PRN Charm Rings, NP       alum & mag hydroxide-simeth (MAALOX/MYLANTA) 200-200-20 MG/5ML suspension 30 mL  30 mL Oral Q4H PRN Charm Rings, NP       atorvastatin (LIPITOR) tablet 10 mg  10 mg Oral q1800 Deal, Adonis Housekeeper, RPH   10 mg at 07/11/21 2131   benztropine (COGENTIN) tablet 1 mg  1 mg Oral BID Charm Rings, NP   1 mg at 07/12/21 0817   divalproex (DEPAKOTE) DR tablet 1,000 mg  1,000 mg Oral Blenda Mounts, NP   1,000 mg at 07/12/21 6160   haloperidol (HALDOL) tablet 10 mg  10 mg Oral BID Donte Kary, Jackquline Denmark, MD   10 mg at 07/12/21 0817   magnesium hydroxide (MILK OF MAGNESIA) suspension 30 mL  30 mL Oral Daily PRN Charm Rings, NP  mirtazapine (REMERON) tablet 15 mg  15 mg Oral QHS Charm Rings, NP   15 mg at 07/11/21 2131   OLANZapine (ZYPREXA) tablet 15 mg  15 mg Oral BID Skyler Dusing, Jackquline Denmark, MD   15 mg at 07/12/21 0817   traZODone (DESYREL) tablet 100 mg  100 mg Oral QHS PRN Charm Rings, NP   100 mg at 07/11/21 2132    Lab Results: No results found for this or any previous visit (from the past 48 hour(s)).  Blood Alcohol level:  Lab Results  Component Value Date   ETH <10 06/14/2021   ETH <10 04/13/2021    Metabolic Disorder Labs: Lab Results  Component Value Date   HGBA1C 5.3 06/29/2021   MPG 105.41 06/29/2021   MPG 108.28 03/25/2018   No results found for: PROLACTIN Lab Results  Component Value Date   CHOL 210 (H) 06/29/2021   TRIG 74 06/29/2021   HDL 60  06/29/2021   CHOLHDL 3.5 06/29/2021   VLDL 15 06/29/2021   LDLCALC 135 (H) 06/29/2021   LDLCALC 125 (H) 03/25/2018    Physical Findings: AIMS: Facial and Oral Movements Muscles of Facial Expression: None, normal Lips and Perioral Area: None, normal Jaw: None, normal Tongue: None, normal,Extremity Movements Upper (arms, wrists, hands, fingers): None, normal Lower (legs, knees, ankles, toes): None, normal, Trunk Movements Neck, shoulders, hips: None, normal, Overall Severity Severity of abnormal movements (highest score from questions above): None, normal Incapacitation due to abnormal movements: None, normal Patient's awareness of abnormal movements (rate only patient's report): No Awareness, Dental Status Current problems with teeth and/or dentures?: No Does patient usually wear dentures?: No  CIWA:    COWS:     Musculoskeletal: Strength & Muscle Tone: within normal limits Gait & Station: normal Patient leans: N/A  Psychiatric Specialty Exam:  Presentation  General Appearance: Casual; Neat  Eye Contact:Fleeting  Speech:Garbled  Speech Volume:Decreased  Handedness:Right   Mood and Affect  Mood:Anxious; Irritable  Affect:Constricted   Thought Process  Thought Processes:Disorganized; Irrevelant  Descriptions of Associations:Loose  Orientation:Partial  Thought Content:Paranoid Ideation; Illogical  History of Schizophrenia/Schizoaffective disorder:Yes  Duration of Psychotic Symptoms:Greater than six months  Hallucinations:No data recorded Ideas of Reference:Paranoia  Suicidal Thoughts:No data recorded Homicidal Thoughts:No data recorded  Sensorium  Memory:Immediate Good  Judgment:Impaired  Insight:Poor   Executive Functions  Concentration:Poor  Attention Span:Poor  Recall:Fair  Fund of Knowledge:Fair  Language:Fair   Psychomotor Activity  Psychomotor Activity:No data recorded  Assets  Assets:Physical Health   Sleep  Sleep:No  data recorded   Physical Exam: Physical Exam Vitals and nursing note reviewed.  Constitutional:      Appearance: Normal appearance.  HENT:     Head: Normocephalic and atraumatic.     Mouth/Throat:     Pharynx: Oropharynx is clear.  Eyes:     Pupils: Pupils are equal, round, and reactive to light.  Cardiovascular:     Rate and Rhythm: Normal rate and regular rhythm.  Pulmonary:     Effort: Pulmonary effort is normal.     Breath sounds: Normal breath sounds.  Abdominal:     General: Abdomen is flat.     Palpations: Abdomen is soft.  Musculoskeletal:        General: Normal range of motion.  Skin:    General: Skin is warm and dry.  Neurological:     General: No focal deficit present.     Mental Status: He is alert. Mental status is at baseline.  Psychiatric:  Attention and Perception: He is inattentive.        Mood and Affect: Mood normal. Affect is blunt.        Speech: Speech is delayed.        Behavior: Behavior is slowed.        Thought Content: Thought content normal.        Cognition and Memory: Cognition is impaired.   Review of Systems  Constitutional: Negative.   HENT: Negative.    Eyes: Negative.   Respiratory: Negative.    Cardiovascular: Negative.   Gastrointestinal: Negative.   Musculoskeletal: Negative.   Skin: Negative.   Neurological: Negative.   Psychiatric/Behavioral:  Negative for depression, hallucinations, memory loss, substance abuse and suicidal ideas. The patient is not nervous/anxious and does not have insomnia.   Blood pressure (!) 101/59, pulse 80, temperature 98.2 F (36.8 C), temperature source Oral, resp. rate 18, height 5\' 10"  (1.778 m), weight 55.8 kg, SpO2 98 %. Body mass index is 17.65 kg/m.   Treatment Plan Summary: Medication management and Plan patient is going to be due for his second long-acting Invega injection tomorrow or Saturday and after that we will start looking at a possible discharge plan for next week.  No other  change to medicine for now.  Saturday, MD 07/12/2021, 4:06 PM

## 2021-07-12 NOTE — Progress Notes (Signed)
Patient awake, alert and visible on the unit. Denies SI/HI/AH/VH and pain when asked. Would not rate anxiety or depression and refused to fill out inventory sheet.   Patient is visible on the unit for meals and takes all medications as prescribed. No adverse reactions noted.   Patient has improved with communication and hygiene.   Vital signs monitored and patient encouraged to verbalize concerns. No concerns verbalized.

## 2021-07-12 NOTE — Plan of Care (Signed)
  Problem: Activity: Goal: Will verbalize the importance of balancing activity with adequate rest periods Outcome: Progressing   Problem: Education: Goal: Will be free of psychotic symptoms Outcome: Progressing Goal: Knowledge of the prescribed therapeutic regimen will improve Outcome: Progressing   Problem: Coping: Goal: Coping ability will improve Outcome: Progressing Goal: Will verbalize feelings Outcome: Progressing   Problem: Health Behavior/Discharge Planning: Goal: Compliance with prescribed medication regimen will improve Outcome: Progressing   Problem: Nutritional: Goal: Ability to achieve adequate nutritional intake will improve Outcome: Progressing   Problem: Role Relationship: Goal: Ability to communicate needs accurately will improve Outcome: Progressing Goal: Ability to interact with others will improve Outcome: Progressing   Problem: Safety: Goal: Ability to redirect hostility and anger into socially appropriate behaviors will improve Outcome: Progressing Goal: Ability to remain free from injury will improve Outcome: Progressing   Problem: Self-Care: Goal: Ability to participate in self-care as condition permits will improve Outcome: Progressing   Problem: Self-Concept: Goal: Will verbalize positive feelings about self Outcome: Progressing   Problem: Consults Goal: Concurrent Medical Patient Education Description: (See Patient Education Module for education specifics) Outcome: Progressing   Problem: [facility] Concurrent Medical Problem Goal: LTG-Pt will be physically stable and he/significant other Description: (Patient will be physically stable and he/significant other will be able to verbalize understanding of follow-up care and symptoms that would warrant further treatment) Outcome: Progressing Goal: STG-Vital signs will be within defined limits or stabilized Description: (STG- Vital signs will be within defined limits or stabilized for  individual) Outcome: Progressing Goal: STG-Compliance with medication and/or treatment as ordered Description: (STG-Compliance with medication and/or treatment as ordered by MD) Outcome: Progressing Goal: STG-Verbalize two symptoms that would warrant further Description: (STG-Verbalize two symptoms that would warrant further treatment) Outcome: Progressing Goal: STG-Patient will participate in management/stabilization Description: (STG-Patient will participate in management/stabilization of medical condition) Outcome: Progressing Goal: STG-Other (Specify): Description: STG-Other Concurrent Medical (Specify): Outcome: Progressing

## 2021-07-12 NOTE — Progress Notes (Signed)
Recreation Therapy Notes  Date: 07/12/2021  Time: 10:00 am    Location: Craft room   Behavioral response: N/A   Intervention Topic: Animal Assisted therapy    Discussion/Intervention: Patient did not attend group.   Clinical Observations/Feedback:  Patient did not attend group.   [name] LRT/CTRS        Kaicee Scarpino 07/12/2021 12:39 PM

## 2021-07-13 DIAGNOSIS — F200 Paranoid schizophrenia: Secondary | ICD-10-CM | POA: Diagnosis not present

## 2021-07-13 NOTE — Plan of Care (Signed)
See progress note Problem: Activity: Goal: Will verbalize the importance of balancing activity with adequate rest periods Outcome: Progressing   Problem: Education: Goal: Will be free of psychotic symptoms Outcome: Progressing Goal: Knowledge of the prescribed therapeutic regimen will improve Outcome: Progressing   Problem: Coping: Goal: Coping ability will improve Outcome: Progressing Goal: Will verbalize feelings Outcome: Progressing   Problem: Health Behavior/Discharge Planning: Goal: Compliance with prescribed medication regimen will improve Outcome: Progressing   Problem: Nutritional: Goal: Ability to achieve adequate nutritional intake will improve Outcome: Progressing   Problem: Role Relationship: Goal: Ability to communicate needs accurately will improve Outcome: Progressing Goal: Ability to interact with others will improve Outcome: Progressing   Problem: Safety: Goal: Ability to redirect hostility and anger into socially appropriate behaviors will improve Outcome: Progressing Goal: Ability to remain free from injury will improve Outcome: Progressing   Problem: Self-Care: Goal: Ability to participate in self-care as condition permits will improve Outcome: Progressing   Problem: Self-Concept: Goal: Will verbalize positive feelings about self Outcome: Progressing   Problem: Consults Goal: Concurrent Medical Patient Education Description: (See Patient Education Module for education specifics) Outcome: Progressing   Problem: [facility] Concurrent Medical Problem Goal: LTG-Pt will be physically stable and he/significant other Description: (Patient will be physically stable and he/significant other will be able to verbalize understanding of follow-up care and symptoms that would warrant further treatment) Outcome: Progressing Goal: STG-Vital signs will be within defined limits or stabilized Description: (STG- Vital signs will be within defined limits or stabilized  for individual) Outcome: Progressing Goal: STG-Compliance with medication and/or treatment as ordered Description: (STG-Compliance with medication and/or treatment as ordered by MD) Outcome: Progressing Goal: STG-Verbalize two symptoms that would warrant further Description: (STG-Verbalize two symptoms that would warrant further treatment) Outcome: Progressing Goal: STG-Patient will participate in management/stabilization Description: (STG-Patient will participate in management/stabilization of medical condition) Outcome: Progressing Goal: STG-Other (Specify): Description: STG-Other Concurrent Medical (Specify): Outcome: Progressing

## 2021-07-13 NOTE — Progress Notes (Signed)
Pt very quiet, still talking to himself, but less pacing today. He has attended some groups.

## 2021-07-13 NOTE — Group Note (Signed)
[facility] LCSW Group Therapy Note   Group Date: 07/13/2021 Start Time: 1400 End Time: 1500  Type of Therapy and Topic:  Group Therapy:  Feelings around Relapse and Recovery  Participation Level:  Did Not Attend   Mood:  Description of Group:    Patients in this group will discuss emotions they experience before and after a relapse. They will process how experiencing these feelings, or avoidance of experiencing them, relates to having a relapse. Facilitator will guide patients to explore emotions they have related to recovery. Patients will be encouraged to process which emotions are more powerful. They will be guided to discuss the emotional reaction significant others in their lives may have to patients' relapse or recovery. Patients will be assisted in exploring ways to respond to the emotions of others without this contributing to a relapse.  Therapeutic Goals: Patient will identify two or more emotions that lead to relapse for them:  Patient will identify two emotions that result when they relapse:  Patient will identify two emotions related to recovery:  Patient will demonstrate ability to communicate their needs through discussion and/or role plays.   Summary of Patient Progress: Patient did not attend group despite encouraged participation.    Therapeutic Modalities:   Cognitive Behavioral Therapy Solution-Focused Therapy Assertiveness Training Relapse Prevention Therapy   Corky Crafts, [place]

## 2021-07-13 NOTE — Progress Notes (Signed)
The doctor is concerned about the patient's weight. I weight him at 65.4 kg today at 4 pm. We  need to weight him before breakfast tomorrow. It looks he has gained 21 pounds since he has been here.

## 2021-07-13 NOTE — Progress Notes (Signed)
Pt.visible on the unit, minimal interaction with peers of staff. He denies SI/HI and AVH. No overt signs of emotional distress. Affect sad to flat and he denies  having anxiety. Pt did not want to talk and he walked away from me. He did take his Depakote from me. Pt responding to internal stimuli, he continues to talk and laugh to himself.

## 2021-07-13 NOTE — Progress Notes (Signed)
Pt less interactive with RN than usual, but cooperative. In room most of shift, out for brief periods of time and not pacing today. He is very guarded, affect sad-flat, and minimal interaction RN and none with other patients.

## 2021-07-13 NOTE — Progress Notes (Signed)
Recreation Therapy Notes   Date: 07/13/2021  Time: 10:30 am     Location: Craft room    Behavioral response: N/A   Intervention Topic: Relaxation   Discussion/Intervention: Patient did not attend group.   Clinical Observations/Feedback:  Patient did not attend group.    Kazaria Gaertner LRT/CTRS         Konnor Vondrasek 07/13/2021 11:55 AM

## 2021-07-13 NOTE — Progress Notes (Signed)
I assumed care for David Davila at about 19:30.  He was pacing about on the unit, which is his baseline per report. He denied any avh/hi/si, appears to be paranoid but denied the same.He has been med compliant, no prns needed or requested for, he ate snacks with peers. He is being monitored as ordered.

## 2021-07-13 NOTE — Progress Notes (Signed)
[facility] MD Progress Note  07/13/2021 4:45 PM David Davila.  MRN:  [id number] Subjective: Follow-up for this 40 year old man with schizophrenia.  Patient seen and chart reviewed.  Patient is not agitated or aggressive but continues to be withdrawn much of the time.  I found him in his bed with his eyes open.  He tells me that he is still not eating because they keep giving him macaroni and cheese.  I do not know if this is correct or not since it is the same exact thing he said yesterday.  He repeated to me that he does not like eating at all because everything that a person eats is dead and he does not like putting dead things in his mouth.  We looked into whether he has been losing weight and cannot tell right now because the scale readings do not make sense.  Nevertheless he certainly does not seem to be very functional. Principal Problem: Schizophrenia, paranoid type (HCC) Diagnosis: Principal Problem:   Schizophrenia, paranoid type (HCC) Active Problems:   Noncompliance with medication regimen   TBI (traumatic brain injury)  Total Time spent with patient: 30 minutes  Past Psychiatric History: Past history of schizophrenia several prior hospitalizations.  Unfortunately patient functions badly outside the hospital and often is off his medicine and winds up in the incarcerate system.  Past Medical History:  Past Medical History:  Diagnosis Date   Acute psychosis (HCC)    Antisocial personality disorder (HCC)    Cannabis abuse    Past   Paranoid schizophrenia (HCC)    Schizoaffective disorder, bipolar type (HCC)    History reviewed. No pertinent surgical history. Family History: History reviewed. No pertinent family history. Family Psychiatric  History: See previous Social History:  Social History   Substance and Sexual Activity  Alcohol Use No     Social History   Substance and Sexual Activity  Drug Use Not Currently    Social History   Socioeconomic History    Marital status: Single    Spouse name: Not on file   Number of children: Not on file   Years of education: Not on file   Highest education level: Not on file  Occupational History   Not on file  Tobacco Use   Smoking status: Every Day    Packs/day: 1.00    Types: Cigarettes   Smokeless tobacco: Never  Vaping Use   Vaping Use: Unknown  Substance and Sexual Activity   Alcohol use: No   Drug use: Not Currently   Sexual activity: Not Currently  Other Topics Concern   Not on file  Social History Narrative   Not on file   Social Determinants of Health   Financial Resource Strain: Not on file  Food Insecurity: Not on file  Transportation Needs: Not on file  Physical Activity: Not on file  Stress: Not on file  Social Connections: Not on file   Additional Social History:                         Sleep: Fair  Appetite:  Poor  Current Medications: Current Facility-Administered Medications  Medication Dose Route Frequency Provider Last Rate Last Admin   acetaminophen (TYLENOL) tablet 650 mg  650 mg Oral Q6H PRN Charm Rings, NP       alum & mag hydroxide-simeth (MAALOX/MYLANTA) 200-200-20 MG/5ML suspension 30 mL  30 mL Oral Q4H PRN Charm Rings, NP  atorvastatin (LIPITOR) tablet 10 mg  10 mg Oral q1800 Deal, Adonis Housekeeper, RPH   10 mg at 07/12/21 2114   benztropine (COGENTIN) tablet 1 mg  1 mg Oral BID Charm Rings, NP   1 mg at 07/13/21 6599   divalproex (DEPAKOTE) DR tablet 1,000 mg  1,000 mg Oral Earl Lites, Herminio Heads, NP   1,000 mg at 07/13/21 3570   haloperidol (HALDOL) tablet 10 mg  10 mg Oral BID Viola Placeres T, MD   10 mg at 07/13/21 0815   magnesium hydroxide (MILK OF MAGNESIA) suspension 30 mL  30 mL Oral Daily PRN Charm Rings, NP       mirtazapine (REMERON) tablet 15 mg  15 mg Oral QHS Charm Rings, NP   15 mg at 07/12/21 2114   OLANZapine (ZYPREXA) tablet 15 mg  15 mg Oral BID Braylin Xu, Jackquline Denmark, MD   15 mg at 07/13/21 0815   traZODone  (DESYREL) tablet 100 mg  100 mg Oral QHS PRN Charm Rings, NP   100 mg at 07/11/21 2132    Lab Results: No results found for this or any previous visit (from the past 48 hour(s)).  Blood Alcohol level:  Lab Results  Component Value Date   ETH <10 06/14/2021   ETH <10 04/13/2021    Metabolic Disorder Labs: Lab Results  Component Value Date   HGBA1C 5.3 06/29/2021   MPG 105.41 06/29/2021   MPG 108.28 03/25/2018   No results found for: PROLACTIN Lab Results  Component Value Date   CHOL 210 (H) 06/29/2021   TRIG 74 06/29/2021   HDL 60 06/29/2021   CHOLHDL 3.5 06/29/2021   VLDL 15 06/29/2021   LDLCALC 135 (H) 06/29/2021   LDLCALC 125 (H) 03/25/2018    Physical Findings: AIMS: Facial and Oral Movements Muscles of Facial Expression: None, normal Lips and Perioral Area: None, normal Jaw: None, normal Tongue: None, normal,Extremity Movements Upper (arms, wrists, hands, fingers): None, normal Lower (legs, knees, ankles, toes): None, normal, Trunk Movements Neck, shoulders, hips: None, normal, Overall Severity Severity of abnormal movements (highest score from questions above): None, normal Incapacitation due to abnormal movements: None, normal Patient's awareness of abnormal movements (rate only patient's report): No Awareness, Dental Status Current problems with teeth and/or dentures?: No Does patient usually wear dentures?: No  CIWA:    COWS:     Musculoskeletal: Strength & Muscle Tone: within normal limits Gait & Station: normal Patient leans: N/A  Psychiatric Specialty Exam:  Presentation  General Appearance: Casual; Neat  Eye Contact:Fleeting  Speech:Garbled  Speech Volume:Decreased  Handedness:Right   Mood and Affect  Mood:Anxious; Irritable  Affect:Constricted   Thought Process  Thought Processes:Disorganized; Irrevelant  Descriptions of Associations:Loose  Orientation:Partial  Thought Content:Paranoid Ideation; Illogical  History of  Schizophrenia/Schizoaffective disorder:Yes  Duration of Psychotic Symptoms:Greater than six months  Hallucinations:No data recorded Ideas of Reference:Paranoia  Suicidal Thoughts:No data recorded Homicidal Thoughts:No data recorded  Sensorium  Memory:Immediate Good  Judgment:Impaired  Insight:Poor   Executive Functions  Concentration:Poor  Attention Span:Poor  Recall:Fair  Fund of Knowledge:Fair  Language:Fair   Psychomotor Activity  Psychomotor Activity:No data recorded  Assets  Assets:Physical Health   Sleep  Sleep:No data recorded   Physical Exam: Physical Exam Vitals and nursing note reviewed.  Constitutional:      Appearance: Normal appearance.  HENT:     Head: Normocephalic and atraumatic.     Mouth/Throat:     Pharynx: Oropharynx is clear.  Eyes:  Pupils: Pupils are equal, round, and reactive to light.  Cardiovascular:     Rate and Rhythm: Normal rate and regular rhythm.  Pulmonary:     Effort: Pulmonary effort is normal.     Breath sounds: Normal breath sounds.  Abdominal:     General: Abdomen is flat.     Palpations: Abdomen is soft.  Musculoskeletal:        General: Normal range of motion.  Skin:    General: Skin is warm and dry.  Neurological:     General: No focal deficit present.     Mental Status: He is alert. Mental status is at baseline.  Psychiatric:        Attention and Perception: He is inattentive.        Mood and Affect: Mood normal. Affect is blunt.        Speech: Speech is delayed.        Behavior: Behavior is slowed.        Thought Content: Thought content is paranoid and delusional. Thought content does not include suicidal ideation.        Cognition and Memory: Cognition is impaired.        Judgment: Judgment is inappropriate.   Review of Systems  Constitutional: Negative.   HENT: Negative.    Eyes: Negative.   Respiratory: Negative.    Cardiovascular: Negative.   Gastrointestinal: Negative.    Musculoskeletal: Negative.   Skin: Negative.   Neurological: Negative.   Psychiatric/Behavioral:  Negative for suicidal ideas. The patient is nervous/anxious.   Blood pressure (!) 101/59, pulse 80, temperature 98.2 F (36.8 C), temperature source Oral, resp. rate 18, height 5\' 10"  (1.778 m), weight 55.8 kg, SpO2 98 %. Body mass index is 17.65 kg/m.   Treatment Plan Summary: Medication management and Plan talked with staff today and nobody is completely sure her overall how much she is eating.  Some days he does not eat well but other days he does and everyone agrees that he drinks a lot of Ensure.  Not starving but I am concerned about how he will do especially if he goes back to jail.  Treatment wise he is due for his second shot this weekend.  After that we will consider adjusting oral medicine and start thinking about possible discharge plans  [place], MD 07/13/2021, 4:45 PM

## 2021-07-14 DIAGNOSIS — F200 Paranoid schizophrenia: Secondary | ICD-10-CM | POA: Diagnosis not present

## 2021-07-14 MED ORDER — PALIPERIDONE PALMITATE ER 156 MG/ML IM SUSY
156.0000 mg | PREFILLED_SYRINGE | Freq: Once | INTRAMUSCULAR | Status: AC
Start: 2021-07-14 — End: 2021-07-14
  Administered 2021-07-14: 156 mg via INTRAMUSCULAR
  Filled 2021-07-14: qty 1

## 2021-07-14 NOTE — Progress Notes (Addendum)
Pt is alert and oriented to person, place, time and situation. Pt refused medication despite several attempts to encourage pt to get out of his bed and come take meds. Pt states, "Im not taking medications." Pt refused to provide a reason for refusal. Pt was in bed with his covers over his head. Pt denies SI/HI/AVH. Pt is very dismissive during shift assessment. Refused to answer questions about feelings of depression and anxiety. Pt was up for breakfast. Will continue to monitor pt per Q15 minute face checks and monitor for safety and progress.

## 2021-07-14 NOTE — Group Note (Signed)
LCSW Group Therapy Note  Group Date: 07/14/2021 Start Time: 1315 End Time: 1415   Type of Therapy and Topic:  Group Therapy - Healthy vs Unhealthy Coping Skills  Participation Level:  Did Not Attend   Description of Group The focus of this group was to determine what unhealthy coping techniques typically are used by group members and what healthy coping techniques would be helpful in coping with various problems. Patients were guided in becoming aware of the differences between healthy and unhealthy coping techniques. Patients were asked to identify 2-3 healthy coping skills they would like to learn to use more effectively.  Therapeutic Goals Patients learned that coping is what human beings do all day long to deal with various situations in their lives Patients defined and discussed healthy vs unhealthy coping techniques Patients identified their preferred coping techniques and identified whether these were healthy or unhealthy Patients determined 2-3 healthy coping skills they would like to become more familiar with and use more often. Patients provided support and ideas to each other   Summary of Patient Progress: Patient did not attend group despite encouraged participation.   Therapeutic Modalities Cognitive Behavioral Therapy Motivational Interviewing  Norberto Sorenson, Theresia Majors 07/14/2021  6:02 PM

## 2021-07-14 NOTE — Progress Notes (Signed)
Patient compliant with invega sustenna IM injection at 1949(see Mar). Patient encouraged to take all his other medications he had reused earlier and was compliant.  Support and encouragement provided.

## 2021-07-14 NOTE — Progress Notes (Signed)
Patient alert and oriented x 3 he is interacting with peers in the day room appropriately, he denies SI/HI/AVH but appears responding to internal stimuli, he was appropriate with staff this evening and complaint with night time medication. No distress noted, 15 minutes safety checks maintained will continue to monitor.

## 2021-07-14 NOTE — Progress Notes (Signed)
[facility] MD Progress Note  07/14/2021 2:07 PM David Davila.  MRN:  [id number] Subjective: Follow-up for this patient with schizophrenia.  Patient continues to be withdrawn to his room.  Complains about the food.  Staff report however that he does eat when actually presented with his meal.  Denies suicidal thought.  Able to have a more lucid conversation although still cognitively slowed.  Paranoid about his meals and disorganized with poor cognition but not violent.  Intermittently still refusing some medication due to poor insight and psychotic symptoms Principal Problem: Schizophrenia, paranoid type (HCC) Diagnosis: Principal Problem:   Schizophrenia, paranoid type (HCC) Active Problems:   Noncompliance with medication regimen   TBI (traumatic brain injury)  Total Time spent with patient: 30 minutes  Past Psychiatric History: Past history of schizophrenia and multiple prior hospitalizations  Past Medical History:  Past Medical History:  Diagnosis Date   Acute psychosis (HCC)    Antisocial personality disorder (HCC)    Cannabis abuse    Past   Paranoid schizophrenia (HCC)    Schizoaffective disorder, bipolar type (HCC)    History reviewed. No pertinent surgical history. Family History: History reviewed. No pertinent family history. Family Psychiatric  History: See previous Social History:  Social History   Substance and Sexual Activity  Alcohol Use No     Social History   Substance and Sexual Activity  Drug Use Not Currently    Social History   Socioeconomic History   Marital status: Single    Spouse name: Not on file   Number of children: Not on file   Years of education: Not on file   Highest education level: Not on file  Occupational History   Not on file  Tobacco Use   Smoking status: Every Day    Packs/day: 1.00    Types: Cigarettes   Smokeless tobacco: Never  Vaping Use   Vaping Use: Unknown  Substance and Sexual Activity   Alcohol use: No    Drug use: Not Currently   Sexual activity: Not Currently  Other Topics Concern   Not on file  Social History Narrative   Not on file   Social Determinants of Health   Financial Resource Strain: Not on file  Food Insecurity: Not on file  Transportation Needs: Not on file  Physical Activity: Not on file  Stress: Not on file  Social Connections: Not on file   Additional Social History:                         Sleep: Poor  Appetite:  Poor  Current Medications: Current Facility-Administered Medications  Medication Dose Route Frequency Provider Last Rate Last Admin   acetaminophen (TYLENOL) tablet 650 mg  650 mg Oral Q6H PRN Charm Rings, NP       alum & mag hydroxide-simeth (MAALOX/MYLANTA) 200-200-20 MG/5ML suspension 30 mL  30 mL Oral Q4H PRN Charm Rings, NP       atorvastatin (LIPITOR) tablet 10 mg  10 mg Oral q1800 Deal, Adonis Housekeeper, RPH   10 mg at 07/13/21 2100   benztropine (COGENTIN) tablet 1 mg  1 mg Oral BID Charm Rings, NP   1 mg at 07/13/21 1706   divalproex (DEPAKOTE) DR tablet 1,000 mg  1,000 mg Oral Blenda Mounts, NP   1,000 mg at 07/13/21 4680   haloperidol (HALDOL) tablet 10 mg  10 mg Oral BID Porsche Noguchi, Jackquline Denmark, MD   10 mg  at 07/13/21 1706   magnesium hydroxide (MILK OF MAGNESIA) suspension 30 mL  30 mL Oral Daily PRN Charm Rings, NP       mirtazapine (REMERON) tablet 15 mg  15 mg Oral QHS Charm Rings, NP   15 mg at 07/13/21 2216   OLANZapine (ZYPREXA) tablet 15 mg  15 mg Oral BID Britni Driscoll T, MD   15 mg at 07/13/21 1706   paliperidone (INVEGA SUSTENNA) injection 156 mg  156 mg Intramuscular Once Earlean Fidalgo, Jackquline Denmark, MD       traZODone (DESYREL) tablet 100 mg  100 mg Oral QHS PRN Charm Rings, NP   100 mg at 07/13/21 2216    Lab Results: No results found for this or any previous visit (from the past 48 hour(s)).  Blood Alcohol level:  Lab Results  Component Value Date   ETH <10 06/14/2021   ETH <10 04/13/2021    Metabolic  Disorder Labs: Lab Results  Component Value Date   HGBA1C 5.3 06/29/2021   MPG 105.41 06/29/2021   MPG 108.28 03/25/2018   No results found for: PROLACTIN Lab Results  Component Value Date   CHOL 210 (H) 06/29/2021   TRIG 74 06/29/2021   HDL 60 06/29/2021   CHOLHDL 3.5 06/29/2021   VLDL 15 06/29/2021   LDLCALC 135 (H) 06/29/2021   LDLCALC 125 (H) 03/25/2018    Physical Findings: AIMS: Facial and Oral Movements Muscles of Facial Expression: None, normal Lips and Perioral Area: None, normal Jaw: None, normal Tongue: None, normal,Extremity Movements Upper (arms, wrists, hands, fingers): None, normal Lower (legs, knees, ankles, toes): None, normal, Trunk Movements Neck, shoulders, hips: None, normal, Overall Severity Severity of abnormal movements (highest score from questions above): None, normal Incapacitation due to abnormal movements: None, normal Patient's awareness of abnormal movements (rate only patient's report): No Awareness, Dental Status Current problems with teeth and/or dentures?: No Does patient usually wear dentures?: No  CIWA:    COWS:     Musculoskeletal: Strength & Muscle Tone: within normal limits Gait & Station: normal Patient leans: N/A  Psychiatric Specialty Exam:  Presentation  General Appearance: Casual; Neat  Eye Contact:Fleeting  Speech:Garbled  Speech Volume:Decreased  Handedness:Right   Mood and Affect  Mood:Anxious; Irritable  Affect:Constricted   Thought Process  Thought Processes:Disorganized; Irrevelant  Descriptions of Associations:Loose  Orientation:Partial  Thought Content:Paranoid Ideation; Illogical  History of Schizophrenia/Schizoaffective disorder:Yes  Duration of Psychotic Symptoms:Greater than six months  Hallucinations:No data recorded Ideas of Reference:Paranoia  Suicidal Thoughts:No data recorded Homicidal Thoughts:No data recorded  Sensorium  Memory:Immediate  Good  Judgment:Impaired  Insight:Poor   Executive Functions  Concentration:Poor  Attention Span:Poor  Recall:Fair  Fund of Knowledge:Fair  Language:Fair   Psychomotor Activity  Psychomotor Activity:No data recorded  Assets  Assets:Physical Health   Sleep  Sleep:No data recorded   Physical Exam: Physical Exam Vitals and nursing note reviewed.  Constitutional:      Appearance: Normal appearance.  HENT:     Head: Normocephalic and atraumatic.     Mouth/Throat:     Pharynx: Oropharynx is clear.  Eyes:     Pupils: Pupils are equal, round, and reactive to light.  Cardiovascular:     Rate and Rhythm: Normal rate and regular rhythm.  Pulmonary:     Effort: Pulmonary effort is normal.     Breath sounds: Normal breath sounds.  Abdominal:     General: Abdomen is flat.     Palpations: Abdomen is soft.  Musculoskeletal:  General: Normal range of motion.  Skin:    General: Skin is warm and dry.  Neurological:     General: No focal deficit present.     Mental Status: He is alert. Mental status is at baseline.  Psychiatric:        Attention and Perception: He is inattentive.        Mood and Affect: Mood normal. Affect is blunt.        Speech: Speech is delayed.        Behavior: Behavior is slowed.        Thought Content: Thought content is paranoid and delusional.        Cognition and Memory: Cognition is impaired.        Judgment: Judgment is inappropriate.   Review of Systems  Constitutional: Negative.   HENT: Negative.    Eyes: Negative.   Respiratory: Negative.    Cardiovascular: Negative.   Gastrointestinal: Negative.   Musculoskeletal: Negative.   Skin: Negative.   Neurological: Negative.   Psychiatric/Behavioral:  Negative for depression, hallucinations, substance abuse and suicidal ideas. The patient is nervous/anxious.   Blood pressure 119/83, pulse 79, temperature 97.9 F (36.6 C), temperature source Oral, resp. rate 18, height 5\' 10"   (1.778 m), weight 55.8 kg, SpO2 98 %. Body mass index is 17.65 kg/m.   Treatment Plan Summary: Medication management and Plan it has been 1 week since his first injection of Invega long-acting and today will be the second at the lower loading dose.  Once that is done we can consider possibly stopping some of the oral medication.  Patient may be coming closer to his baseline although he is still symptomatic.  Despite his complaints about the food he does appear to be eating.  , MD 07/14/2021, 2:07 PM

## 2021-07-15 DIAGNOSIS — F200 Paranoid schizophrenia: Secondary | ICD-10-CM | POA: Diagnosis not present

## 2021-07-15 MED ORDER — HALOPERIDOL 5 MG PO TABS
5.0000 mg | ORAL_TABLET | Freq: Two times a day (BID) | ORAL | Status: DC
  Administered 2021-07-15 – 2021-07-17 (×4): 5 mg via ORAL
  Filled 2021-07-15 (×4): qty 1

## 2021-07-15 NOTE — Progress Notes (Signed)
[facility] MD Progress Note  07/15/2021 1:23 PM David Davila.  MRN:  [id number] Subjective: Follow-up with this patient with schizophrenia.  Patient seen and chart reviewed.  He has been less interactive recently spending more time in bed.  On interview he continues to complain that they are "giving me meat" on his tray.  I am not sure that that is correct and staff report that he does still eat.  Denies hallucinations but is not very forthcoming or helpful in interview Principal Problem: Schizophrenia, paranoid type (HCC) Diagnosis: Principal Problem:   Schizophrenia, paranoid type (HCC) Active Problems:   Noncompliance with medication regimen   TBI (traumatic brain injury)  Total Time spent with patient: 30 minutes  Past Psychiatric History: Past history of longstanding schizophrenia  Past Medical History:  Past Medical History:  Diagnosis Date   Acute psychosis (HCC)    Antisocial personality disorder (HCC)    Cannabis abuse    Past   Paranoid schizophrenia (HCC)    Schizoaffective disorder, bipolar type (HCC)    History reviewed. No pertinent surgical history. Family History: History reviewed. No pertinent family history. Family Psychiatric  History: See previous Social History:  Social History   Substance and Sexual Activity  Alcohol Use No     Social History   Substance and Sexual Activity  Drug Use Not Currently    Social History   Socioeconomic History   Marital status: Single    Spouse name: Not on file   Number of children: Not on file   Years of education: Not on file   Highest education level: Not on file  Occupational History   Not on file  Tobacco Use   Smoking status: Every Day    Packs/day: 1.00    Types: Cigarettes   Smokeless tobacco: Never  Vaping Use   Vaping Use: Unknown  Substance and Sexual Activity   Alcohol use: No   Drug use: Not Currently   Sexual activity: Not Currently  Other Topics Concern   Not on file  Social History  Narrative   Not on file   Social Determinants of Health   Financial Resource Strain: Not on file  Food Insecurity: Not on file  Transportation Needs: Not on file  Physical Activity: Not on file  Stress: Not on file  Social Connections: Not on file   Additional Social History:                         Sleep: Fair  Appetite:  Poor  Current Medications: Current Facility-Administered Medications  Medication Dose Route Frequency Provider Last Rate Last Admin   acetaminophen (TYLENOL) tablet 650 mg  650 mg Oral Q6H PRN Charm Rings, NP       alum & mag hydroxide-simeth (MAALOX/MYLANTA) 200-200-20 MG/5ML suspension 30 mL  30 mL Oral Q4H PRN Charm Rings, NP       atorvastatin (LIPITOR) tablet 10 mg  10 mg Oral q1800 Deal, Adonis Housekeeper, RPH   10 mg at 07/14/21 1958   benztropine (COGENTIN) tablet 1 mg  1 mg Oral BID Charm Rings, NP   1 mg at 07/15/21 0759   divalproex (DEPAKOTE) DR tablet 1,000 mg  1,000 mg Oral Blenda Mounts, NP   1,000 mg at 07/15/21 0759   haloperidol (HALDOL) tablet 10 mg  10 mg Oral BID Calen Geister, Jackquline Denmark, MD   10 mg at 07/15/21 0758   magnesium hydroxide (MILK OF MAGNESIA)  suspension 30 mL  30 mL Oral Daily PRN Charm Rings, NP       mirtazapine (REMERON) tablet 15 mg  15 mg Oral QHS Charm Rings, NP   15 mg at 07/14/21 2001   OLANZapine (ZYPREXA) tablet 15 mg  15 mg Oral BID Jodey Burbano, Jackquline Denmark, MD   15 mg at 07/15/21 0759   traZODone (DESYREL) tablet 100 mg  100 mg Oral QHS PRN Charm Rings, NP   100 mg at 07/13/21 2216    Lab Results: No results found for this or any previous visit (from the past 48 hour(s)).  Blood Alcohol level:  Lab Results  Component Value Date   ETH <10 06/14/2021   ETH <10 04/13/2021    Metabolic Disorder Labs: Lab Results  Component Value Date   HGBA1C 5.3 06/29/2021   MPG 105.41 06/29/2021   MPG 108.28 03/25/2018   No results found for: PROLACTIN Lab Results  Component Value Date   CHOL 210 (H)  06/29/2021   TRIG 74 06/29/2021   HDL 60 06/29/2021   CHOLHDL 3.5 06/29/2021   VLDL 15 06/29/2021   LDLCALC 135 (H) 06/29/2021   LDLCALC 125 (H) 03/25/2018    Physical Findings: AIMS: Facial and Oral Movements Muscles of Facial Expression: None, normal Lips and Perioral Area: None, normal Jaw: None, normal Tongue: None, normal,Extremity Movements Upper (arms, wrists, hands, fingers): None, normal Lower (legs, knees, ankles, toes): None, normal, Trunk Movements Neck, shoulders, hips: None, normal, Overall Severity Severity of abnormal movements (highest score from questions above): None, normal Incapacitation due to abnormal movements: None, normal Patient's awareness of abnormal movements (rate only patient's report): No Awareness, Dental Status Current problems with teeth and/or dentures?: No Does patient usually wear dentures?: No  CIWA:    COWS:     Musculoskeletal: Strength & Muscle Tone: within normal limits Gait & Station: normal Patient leans: N/A  Psychiatric Specialty Exam:  Presentation  General Appearance: Casual; Neat  Eye Contact:Fleeting  Speech:Garbled  Speech Volume:Decreased  Handedness:Right   Mood and Affect  Mood:Anxious; Irritable  Affect:Constricted   Thought Process  Thought Processes:Disorganized; Irrevelant  Descriptions of Associations:Loose  Orientation:Partial  Thought Content:Paranoid Ideation; Illogical  History of Schizophrenia/Schizoaffective disorder:Yes  Duration of Psychotic Symptoms:Greater than six months  Hallucinations:No data recorded Ideas of Reference:Paranoia  Suicidal Thoughts:No data recorded Homicidal Thoughts:No data recorded  Sensorium  Memory:Immediate Good  Judgment:Impaired  Insight:Poor   Executive Functions  Concentration:Poor  Attention Span:Poor  Recall:Fair  Fund of Knowledge:Fair  Language:Fair   Psychomotor Activity  Psychomotor Activity:No data recorded  Assets   Assets:Physical Health   Sleep  Sleep:No data recorded   Physical Exam: Physical Exam Vitals and nursing note reviewed.  Constitutional:      Appearance: Normal appearance.  HENT:     Head: Normocephalic and atraumatic.     Mouth/Throat:     Pharynx: Oropharynx is clear.  Eyes:     Pupils: Pupils are equal, round, and reactive to light.  Cardiovascular:     Rate and Rhythm: Normal rate and regular rhythm.  Pulmonary:     Effort: Pulmonary effort is normal.     Breath sounds: Normal breath sounds.  Abdominal:     General: Abdomen is flat.     Palpations: Abdomen is soft.  Musculoskeletal:        General: Normal range of motion.  Skin:    General: Skin is warm and dry.  Neurological:     General: No focal deficit  present.     Mental Status: He is alert. Mental status is at baseline.  Psychiatric:        Attention and Perception: He is inattentive.        Mood and Affect: Mood normal. Affect is blunt.        Speech: He is noncommunicative.        Behavior: Behavior is slowed.        Thought Content: Thought content normal.        Cognition and Memory: Cognition is impaired.   Review of Systems  Constitutional: Negative.   HENT: Negative.    Eyes: Negative.   Respiratory: Negative.    Cardiovascular: Negative.   Gastrointestinal: Negative.   Musculoskeletal: Negative.   Skin: Negative.   Neurological: Negative.   Psychiatric/Behavioral: Negative.    Blood pressure 119/83, pulse 79, temperature 97.9 F (36.6 C), temperature source Oral, resp. rate 18, height 5\' 10"  (1.778 m), weight 55.8 kg, SpO2 98 %. Body mass index is 17.65 kg/m.   Treatment Plan Summary: Plan patient got his second shot of Invega yesterday.  We can probably back off of some of his oral medicine and antipsychotics today.  Still impaired but possibly near his baseline and we may consider discharge if it turns out [facility] to the jail that is the plan anyway.  , MD 07/15/2021,  1:23 PM

## 2021-07-15 NOTE — BH IP Treatment Plan (Signed)
Interdisciplinary Treatment and Diagnostic Plan Update  07/15/2021 Time of Session: 12:00PM David Davila. MRN: [id number]  Principal Diagnosis: Schizophrenia, paranoid type (HCC)  Secondary Diagnoses: Principal Problem:   Schizophrenia, paranoid type (HCC) Active Problems:   Noncompliance with medication regimen   TBI (traumatic brain injury)   Current Medications:  Current Facility-Administered Medications  Medication Dose Route Frequency Provider Last Rate Last Admin   acetaminophen (TYLENOL) tablet 650 mg  650 mg Oral Q6H PRN Charm Rings, NP       alum & mag hydroxide-simeth (MAALOX/MYLANTA) 200-200-20 MG/5ML suspension 30 mL  30 mL Oral Q4H PRN Charm Rings, NP       atorvastatin (LIPITOR) tablet 10 mg  10 mg Oral q1800 Deal, Adonis Housekeeper, RPH   10 mg at 07/14/21 1958   benztropine (COGENTIN) tablet 1 mg  1 mg Oral BID Charm Rings, NP   1 mg at 07/15/21 0759   divalproex (DEPAKOTE) DR tablet 1,000 mg  1,000 mg Oral Blenda Mounts, NP   1,000 mg at 07/15/21 0759   haloperidol (HALDOL) tablet 10 mg  10 mg Oral BID Clapacs, Jackquline Denmark, MD   10 mg at 07/15/21 0758   magnesium hydroxide (MILK OF MAGNESIA) suspension 30 mL  30 mL Oral Daily PRN Charm Rings, NP       mirtazapine (REMERON) tablet 15 mg  15 mg Oral QHS Charm Rings, NP   15 mg at 07/14/21 2001   OLANZapine (ZYPREXA) tablet 15 mg  15 mg Oral BID Clapacs, Jackquline Denmark, MD   15 mg at 07/15/21 0759   traZODone (DESYREL) tablet 100 mg  100 mg Oral QHS PRN Charm Rings, NP   100 mg at 07/13/21 2216   PTA Medications: Medications Prior to Admission  Medication Sig Dispense Refill Last Dose   atorvastatin (LIPITOR) 10 MG tablet Take 1 tablet (10 mg total) by mouth daily at 6 PM. 30 tablet 0    benztropine (COGENTIN) 1 MG tablet Take 1 tablet (1 mg total) by mouth 2 (two) times daily. 60 tablet 0    divalproex (DEPAKOTE) 500 MG DR tablet Take 2 tablets (1,000 mg total) by mouth every morning. 60 tablet  0    haloperidol (HALDOL) 5 MG tablet Take 1 tablet (5 mg total) by mouth at bedtime. 30 tablet 0    mirtazapine (REMERON) 15 MG tablet Take 1 tablet (15 mg total) by mouth at bedtime. 30 tablet 0    OLANZapine (ZYPREXA) 10 MG tablet Take 1 tablet (10 mg total) by mouth 2 (two) times daily. 60 tablet 0    paliperidone (INVEGA SUSTENNA) 156 MG/ML SUSY injection Inject 1 mL (156 mg total) into the muscle once for 1 dose. (Patient not taking: Reported on 02/06/2021) 1 mL 0    paliperidone (INVEGA SUSTENNA) 234 MG/1.5ML SUSY injection Inject 234 mg into the muscle once for 1 dose. Next injection due 05/27/18 (Patient not taking: Reported on 02/06/2021) 1.5 mL 0    paliperidone (INVEGA) 6 MG 24 hr tablet Take 1 tablet (6 mg total) by mouth daily. (Patient not taking: No sig reported) 30 tablet 0    traZODone (DESYREL) 100 MG tablet Take 1 tablet (100 mg total) by mouth at bedtime as needed for sleep. (Patient not taking: Reported on 06/14/2021) 30 tablet 0     Patient Stressors: Financial difficulties   Medication change or noncompliance    Patient Strengths: Motivation for treatment/growth   Treatment Modalities:  Medication Management, Group therapy, Case management,  1 to 1 session with clinician, Psychoeducation, Recreational therapy.   Physician Treatment Plan for Primary Diagnosis: Schizophrenia, paranoid type (HCC) Long Term Goal(s): Improvement in symptoms so as ready for discharge   Short Term Goals: Ability to demonstrate self-control will improve Ability to maintain clinical measurements within normal limits will improve Compliance with prescribed medications will improve Ability to disclose and discuss suicidal ideas Ability to identify and develop effective coping behaviors will improve  Medication Management: Evaluate patient's response, side effects, and tolerance of medication regimen.  Therapeutic Interventions: 1 to 1 sessions, Unit Group sessions and Medication  administration.  Evaluation of Outcomes: Progressing  Physician Treatment Plan for Secondary Diagnosis: Principal Problem:   Schizophrenia, paranoid type (HCC) Active Problems:   Noncompliance with medication regimen   TBI (traumatic brain injury)  Long Term Goal(s): Improvement in symptoms so as ready for discharge   Short Term Goals: Ability to demonstrate self-control will improve Ability to maintain clinical measurements within normal limits will improve Compliance with prescribed medications will improve Ability to disclose and discuss suicidal ideas Ability to identify and develop effective coping behaviors will improve     Medication Management: Evaluate patient's response, side effects, and tolerance of medication regimen.  Therapeutic Interventions: 1 to 1 sessions, Unit Group sessions and Medication administration.  Evaluation of Outcomes: Progressing   RN Treatment Plan for Primary Diagnosis: Schizophrenia, paranoid type (HCC) Long Term Goal(s): Knowledge of disease and therapeutic regimen to maintain health will improve  Short Term Goals: Ability to remain free from injury will improve, Ability to verbalize frustration and anger appropriately will improve, Ability to demonstrate self-control, Ability to participate in decision making will improve, Ability to verbalize feelings will improve, Ability to disclose and discuss suicidal ideas, Ability to identify and develop effective coping behaviors will improve, and Compliance with prescribed medications will improve  Medication Management: RN will administer medications as ordered by provider, will assess and evaluate patient's response and provide education to patient for prescribed medication. RN will report any adverse and/or side effects to prescribing provider.  Therapeutic Interventions: 1 on 1 counseling sessions, Psychoeducation, Medication administration, Evaluate responses to treatment, Monitor vital signs and CBGs  as ordered, Perform/monitor CIWA, COWS, AIMS and Fall Risk screenings as ordered, Perform wound care treatments as ordered.  Evaluation of Outcomes: Progressing   LCSW Treatment Plan for Primary Diagnosis: Schizophrenia, paranoid type (HCC) Long Term Goal(s): Safe transition to appropriate next level of care at discharge, Engage patient in therapeutic group addressing interpersonal concerns.  Short Term Goals: Engage patient in aftercare planning with referrals and resources, Increase social support, Increase ability to appropriately verbalize feelings, Increase emotional regulation, Facilitate acceptance of mental health diagnosis and concerns, and Increase skills for wellness and recovery  Therapeutic Interventions: Assess for all discharge needs, 1 to 1 time with Social worker, Explore available resources and support systems, Assess for adequacy in community support network, Educate family and significant other(s) on suicide prevention, Complete Psychosocial Assessment, Interpersonal group therapy.  Evaluation of Outcomes: Progressing   Progress in Treatment: Attending groups: No. Participating in groups: No. Taking medication as prescribed: No. Toleration medication: Yes. Family/Significant other contact made: Yes, individual(s) contacted:  patient's guardian Patient understands diagnosis: No. Discussing patient identified problems/goals with staff: Yes. Medical problems stabilized or resolved: Yes. Denies suicidal/homicidal ideation: Yes. Issues/concerns per patient self-inventory: No. Other: none.  New problem(s) identified: No, Describe:  none.  New Short Term/Long Term Goal(s): elimination of symptoms of psychosis,  medication management for mood stabilization; elimination of SI thoughts; development of comprehensive mental wellness/sobriety plan. Update 06/30/2021: No changes at this time. Update 07/06/21: No changes at this time. Update 07/10/21: No changes at this time. Update  07/15/2021: No changes at this time.   Patient Goals:   "I don't know what they brought me to the hospital for." Update 06/30/2021: No changes at this time. Update 07/06/21: No changes at this time. Update 07/10/21: No changes at this time. Update 07/15/2021: No changes at this time.   Discharge Plan or Barriers: CSW will assist pt/guardian with development of an appropriate aftercare/discharge plan.06/30/2021: No changes at this time. Update 07/06/21: No changes at this time. Update 07/10/21: No changes at this time. Update 07/15/2021: No changes at this time.   Reason for Continuation of Hospitalization: Delusions  Medication stabilization  Estimated Length of Stay: TBD   Scribe for Treatment Team: Marletta Lor 07/15/2021 12:51 PM

## 2021-07-15 NOTE — Plan of Care (Signed)
Pt is selectively mute during assessment. Pt only participates to denies SI/ HI/ AVH. No obvious distress or injury noted at this time. Staff will continue to monitor. Is adherent with scheduled medications this morning.    Problem: Activity: Goal: Will verbalize the importance of balancing activity with adequate rest periods Outcome: Not Progressing   Problem: Education: Goal: Will be free of psychotic symptoms Outcome: Not Progressing Goal: Knowledge of the prescribed therapeutic regimen will improve Outcome: Not Progressing

## 2021-07-15 NOTE — Group Note (Signed)
LCSW Group Therapy Note  Group Date: 07/15/2021 Start Time: 1315 End Time: 1400   Type of Therapy and Topic:  Group Therapy - How To Cope with Nervousness about Discharge   Participation Level:  Did Not Attend   Description of Group This process group involved identification of patients' feelings about discharge. Some of them are scheduled to be discharged soon, while others are new admissions, but each of them was asked to share thoughts and feelings surrounding discharge from the hospital. One common theme was that they are excited at the prospect of going home, while another was that many of them are apprehensive about sharing why they were hospitalized. Patients were given the opportunity to discuss these feelings with their peers in preparation for discharge.  Therapeutic Goals  Patient will identify their overall feelings about pending discharge. Patient will think about how they might proactively address issues that they believe will once again arise once they get home (i.e. with parents). Patients will participate in discussion about having hope for change.   Summary of Patient Progress: Patient did not attend group despite encouraged participation.   Therapeutic Modalities Cognitive Behavioral Therapy   Marletta Lor 07/15/2021  5:24 PM

## 2021-07-16 DIAGNOSIS — F200 Paranoid schizophrenia: Secondary | ICD-10-CM | POA: Diagnosis not present

## 2021-07-16 NOTE — Group Note (Signed)
[facility] LCSW Group Therapy Note    Group Date: 07/16/2021 Start Time: 1300 End Time: 1400  Type of Therapy and Topic:  Group Therapy:  Overcoming Obstacles  Participation Level:  [facility] PARTICIPATION LEVEL: Did Not Attend  Mood:  Description of Group:   In this group patients will be encouraged to explore what they see as obstacles to their own wellness and recovery. They will be guided to discuss their thoughts, feelings, and behaviors related to these obstacles. The group will process together ways to cope with barriers, with attention given to specific choices patients can make. Each patient will be challenged to identify changes they are motivated to make in order to overcome their obstacles. This group will be process-oriented, with patients participating in exploration of their own experiences as well as giving and receiving support and challenge from other group members.  Therapeutic Goals: 1. Patient will identify personal and current obstacles as they relate to admission. 2. Patient will identify barriers that currently interfere with their wellness or overcoming obstacles.  3. Patient will identify feelings, thought process and behaviors related to these barriers. 4. Patient will identify two changes they are willing to make to overcome these obstacles:    Summary of Patient Progress   X   Therapeutic Modalities:   Cognitive Behavioral Therapy Solution Focused Therapy Motivational Interviewing Relapse Prevention Therapy   Harden Mo, LCSW

## 2021-07-16 NOTE — Progress Notes (Signed)
Patient is quiet and reserved. Paces the hall keeping to himself. Will speak when spoken to, but remains isolative to himself with minimal interaction with both staff and peers.  He is med compliant and received his meds without incident. He denies si/hi/avh depression and anxiety at this encounter.  Will continue to monitor with 1 15 minute safety checks.   Cleo Butler-Nicholson, LPN

## 2021-07-16 NOTE — Plan of Care (Signed)
  Problem: Activity: Goal: Will verbalize the importance of balancing activity with adequate rest periods Outcome: Progressing   Problem: Education: Goal: Will be free of psychotic symptoms Outcome: Progressing Goal: Knowledge of the prescribed therapeutic regimen will improve Outcome: Progressing   Problem: Health Behavior/Discharge Planning: Goal: Compliance with prescribed medication regimen will improve Outcome: Progressing

## 2021-07-16 NOTE — Progress Notes (Signed)
Patient remains isolative to himself. He paces at times and continues to be internally occupied.  He is med compliant and received his meds without incident. He denies si/hi.avh  at this encounter.Will continue to monitor with Q 15 minute safety checks.    Cleo Butler-Nicholson, LPN

## 2021-07-16 NOTE — Plan of Care (Addendum)
Patient during assessments this morning observed in his room sleeping in bed just prior to our engagement. Pt. During our engagement did not endorse his mood, but presented oddly related, atypical, and dysphoric in his interpersonal style, with poor eye contact, and a flat to blunted affect. Overall the patient was very minimal, short, vague, and concrete; hardly amenable to answering questions when prompted. From the questions answered, he denied si/hi/avh and endorsed an ability to continue to remain safe on the unit. He denied physical pain and endorsed toleration of medications thus far. Endorsed no problems with sleeping and or eating at this time. Pt. Had no complaints for this Clinical research associate. His orientation appeared grossly intact. Appeared internally preoccupied.   Patient has been complaint with medications and unit procedures thus far with direction and encouragement. Pt. Has been observed eating good thus far. Pt. Has been able to remain safe on the unit thus far. Patient outside of meals has been observed primarily isolative and withdrawn to his room. Patient interactions with staff and peers very minimal.  Q x 15 minute observation checks in place/maintained for safety. Patient is provided with education throughout shift when appropriate and able.  Patient is given/offered medications per orders. Patient is encouraged to attend groups, participate in unit activities and continue with plan of care. Pt. Chart and plans of care reviewed. Pt. Given support and encouragement when appropriate and able.     Problem: Activity: Goal: Will verbalize the importance of balancing activity with adequate rest periods Outcome: Not Progressing Note: Patient is hardly verbal. Problem: Education: Goal: Will be free of psychotic symptoms Outcome: Not Progressing Note: Patient continues to present flat to blunted in his affect, oddly related and dysphoric in his interpersonal style, and concrete and very minimal in  his thought process and speech.  Goal: Knowledge of the prescribed therapeutic regimen will improve Outcome: Not Progressing Note: Patient endorses no insight into medications or his mental illness.  Problem: Coping: Goal: Coping ability will improve Outcome: Not Progressing Goal: Will verbalize feelings Outcome: Not Progressing Problem: Health Behavior/Discharge Planning: Goal: Compliance with prescribed medication regimen will improve Outcome: Progressing Note: Patient is complaint with medications.  Problem: Nutritional: Goal: Ability to achieve adequate nutritional intake will improve Outcome: Progressing Note: Patient is observed eating effectively.  Problem: Safety: Goal: Ability to remain free from injury will improve Outcome: Progressing Note: Patient has been able to be kept safe on the unit, has not made any attempts to harm self or others, and denied si/hi/avh.

## 2021-07-16 NOTE — Progress Notes (Signed)
Recreation Therapy Notes  Date: 07/16/2021  Time: 10:40 am     Location: Craft room    Behavioral response: N/A   Intervention Topic: Time Management    Discussion/Intervention: Patient did not attend group.   Clinical Observations/Feedback:  Patient did not attend group.    Caulder Wehner LRT/CTRS        Saje Gallop 07/16/2021 11:56 AM

## 2021-07-16 NOTE — Progress Notes (Signed)
[facility] MD Progress Note  07/16/2021 2:14 PM [name]  MRN:  [id number] Subjective: Follow-up for this man with schizophrenia.  Patient seen and chart reviewed.  Patient has been keeping to himself for the last couple days.  Found him in his room wide awake but covered up with a blanket.  Patient tells me he has been doing "a lot of thinking".  When I ask him what he was thinking about he told me it was "the [employer]".  Patient denies active hallucinations.  Flat withdrawn negative.  Still says he is not getting appropriate food and he may be correct about this.  He tells me that this morning there was a note on his tray saying that they were unable to fulfill his vegan diet.  Denies active suicidal ideation.  Currently compliant.  Tolerating decrease in oral antipsychotics now that he has gotten a second injection Principal Problem: Schizophrenia, paranoid type (HCC) Diagnosis: Principal Problem:   Schizophrenia, paranoid type (HCC) Active Problems:   Noncompliance with medication regimen   TBI (traumatic brain injury)  Total Time spent with patient: 30 minutes  Past Psychiatric History: Past history of longstanding schizophrenia  Past Medical History:  Past Medical History:  Diagnosis Date   Acute psychosis (HCC)    Antisocial personality disorder (HCC)    Cannabis abuse    Past   Paranoid schizophrenia (HCC)    Schizoaffective disorder, bipolar type (HCC)    History reviewed. No pertinent surgical history. Family History: History reviewed. No pertinent family history. Family Psychiatric  History: See previous.  No information really Social History:  Social History   Substance and Sexual Activity  Alcohol Use No     Social History   Substance and Sexual Activity  Drug Use Not Currently    Social History   Socioeconomic History   Marital status: Single    Spouse name: Not on file   Number of children: Not on file   Years of education: Not on file    Highest education level: Not on file  Occupational History   Not on file  Tobacco Use   Smoking status: Every Day    Packs/day: 1.00    Types: Cigarettes   Smokeless tobacco: Never  Vaping Use   Vaping Use: Unknown  Substance and Sexual Activity   Alcohol use: No   Drug use: Not Currently   Sexual activity: Not Currently  Other Topics Concern   Not on file  Social History Narrative   Not on file   Social Determinants of Health   Financial Resource Strain: Not on file  Food Insecurity: Not on file  Transportation Needs: Not on file  Physical Activity: Not on file  Stress: Not on file  Social Connections: Not on file   Additional Social History:                         Sleep: Fair  Appetite:  Fair  Current Medications: Current Facility-Administered Medications  Medication Dose Route Frequency Provider Last Rate Last Admin   acetaminophen (TYLENOL) tablet 650 mg  650 mg Oral Q6H PRN Charm Rings, NP       alum & mag hydroxide-simeth (MAALOX/MYLANTA) 200-200-20 MG/5ML suspension 30 mL  30 mL Oral Q4H PRN Charm Rings, NP       atorvastatin (LIPITOR) tablet 10 mg  10 mg Oral q1800 Deal, Adonis Housekeeper, RPH   10 mg at 07/15/21 2130  benztropine (COGENTIN) tablet 1 mg  1 mg Oral BID Charm Rings, NP   1 mg at 07/16/21 1696   divalproex (DEPAKOTE) DR tablet 1,000 mg  1,000 mg Oral Earl Lites, Herminio Heads, NP   1,000 mg at 07/16/21 7893   haloperidol (HALDOL) tablet 5 mg  5 mg Oral BID Lorae Roig T, MD   5 mg at 07/16/21 0743   magnesium hydroxide (MILK OF MAGNESIA) suspension 30 mL  30 mL Oral Daily PRN Charm Rings, NP       mirtazapine (REMERON) tablet 15 mg  15 mg Oral QHS Charm Rings, NP   15 mg at 07/15/21 2130   OLANZapine (ZYPREXA) tablet 15 mg  15 mg Oral BID Nikole Swartzentruber, Jackquline Denmark, MD   15 mg at 07/16/21 8101   traZODone (DESYREL) tablet 100 mg  100 mg Oral QHS PRN Charm Rings, NP   100 mg at 07/13/21 2216    Lab Results: No results found for  this or any previous visit (from the past 48 hour(s)).  Blood Alcohol level:  Lab Results  Component Value Date   ETH <10 06/14/2021   ETH <10 04/13/2021    Metabolic Disorder Labs: Lab Results  Component Value Date   HGBA1C 5.3 06/29/2021   MPG 105.41 06/29/2021   MPG 108.28 03/25/2018   No results found for: PROLACTIN Lab Results  Component Value Date   CHOL 210 (H) 06/29/2021   TRIG 74 06/29/2021   HDL 60 06/29/2021   CHOLHDL 3.5 06/29/2021   VLDL 15 06/29/2021   LDLCALC 135 (H) 06/29/2021   LDLCALC 125 (H) 03/25/2018    Physical Findings: AIMS: Facial and Oral Movements Muscles of Facial Expression: None, normal Lips and Perioral Area: None, normal Jaw: None, normal Tongue: None, normal,Extremity Movements Upper (arms, wrists, hands, fingers): None, normal Lower (legs, knees, ankles, toes): None, normal, Trunk Movements Neck, shoulders, hips: None, normal, Overall Severity Severity of abnormal movements (highest score from questions above): None, normal Incapacitation due to abnormal movements: None, normal Patient's awareness of abnormal movements (rate only patient's report): No Awareness, Dental Status Current problems with teeth and/or dentures?: No Does patient usually wear dentures?: No  CIWA:    COWS:     Musculoskeletal: Strength & Muscle Tone: within normal limits Gait & Station: normal Patient leans: N/A  Psychiatric Specialty Exam:  Presentation  General Appearance: Casual; Neat  Eye Contact:Fleeting  Speech:Garbled  Speech Volume:Decreased  Handedness:Right   Mood and Affect  Mood:Anxious; Irritable  Affect:Constricted   Thought Process  Thought Processes:Disorganized; Irrevelant  Descriptions of Associations:Loose  Orientation:Partial  Thought Content:Paranoid Ideation; Illogical  History of Schizophrenia/Schizoaffective disorder:Yes  Duration of Psychotic Symptoms:Greater than six months  Hallucinations:No data  recorded Ideas of Reference:Paranoia  Suicidal Thoughts:No data recorded Homicidal Thoughts:No data recorded  Sensorium  Memory:Immediate Good  Judgment:Impaired  Insight:Poor   Executive Functions  Concentration:Poor  Attention Span:Poor  Recall:Fair  Fund of Knowledge:Fair  Language:Fair   Psychomotor Activity  Psychomotor Activity:No data recorded  Assets  Assets:Physical Health   Sleep  Sleep:No data recorded   Physical Exam: Physical Exam Vitals and nursing note reviewed.  Constitutional:      Appearance: Normal appearance.  HENT:     Head: Normocephalic and atraumatic.     Mouth/Throat:     Pharynx: Oropharynx is clear.  Eyes:     Pupils: Pupils are equal, round, and reactive to light.  Cardiovascular:     Rate and Rhythm: Normal rate and  regular rhythm.  Pulmonary:     Effort: Pulmonary effort is normal.     Breath sounds: Normal breath sounds.  Abdominal:     General: Abdomen is flat.     Palpations: Abdomen is soft.  Musculoskeletal:        General: Normal range of motion.  Skin:    General: Skin is warm and dry.  Neurological:     General: No focal deficit present.     Mental Status: He is alert. Mental status is at baseline.  Psychiatric:        Attention and Perception: Attention normal.        Mood and Affect: Mood normal. Affect is blunt.        Speech: Speech is delayed.        Behavior: Behavior is slowed.        Thought Content: Thought content is delusional.        Cognition and Memory: Cognition is impaired.   Review of Systems  Constitutional: Negative.   HENT: Negative.    Eyes: Negative.   Respiratory: Negative.    Cardiovascular: Negative.   Gastrointestinal: Negative.   Musculoskeletal: Negative.   Skin: Negative.   Neurological: Negative.   Psychiatric/Behavioral: Negative.    Blood pressure 119/83, pulse 79, temperature 97.9 F (36.6 C), temperature source Oral, resp. rate 18, height 5\' 10"  (1.778 m), weight  55.8 kg, SpO2 98 %. Body mass index is 17.65 kg/m.   Treatment Plan Summary: Medication management and Plan gradually taper oral Haldol.  Encourage patient to get out of his room.  Today I opened up the subject with him of the possibility of having to go back to jail.  Patient understands this.  Does not have much to say about it.  Patient unfortunately I think is probably chronically going to be impaired in his ability to deal with the legal system.  From a psychiatric and medical standpoint however he appears to be stabilizing.  We may want to contact his guardian within the next few days about discharge planning.  , MD 07/16/2021, 2:14 PM

## 2021-07-17 DIAGNOSIS — F200 Paranoid schizophrenia: Secondary | ICD-10-CM | POA: Diagnosis not present

## 2021-07-17 MED ORDER — HALOPERIDOL 1 MG PO TABS
2.0000 mg | ORAL_TABLET | Freq: Two times a day (BID) | ORAL | Status: DC
  Administered 2021-07-17 – 2021-07-25 (×10): 2 mg via ORAL
  Filled 2021-07-17 (×15): qty 2

## 2021-07-17 NOTE — Plan of Care (Signed)
  Problem: Group Participation Goal: STG - Patient will engage in groups without prompting or encouragement from LRT x3 group sessions within 5 recreation therapy group sessions Description: STG - Patient will engage in groups without prompting or encouragement from LRT x3 group sessions within 5 recreation therapy group sessions Outcome: Not Progressing   

## 2021-07-17 NOTE — Plan of Care (Signed)
  Problem: Activity: Goal: Will verbalize the importance of balancing activity with adequate rest periods Outcome: Progressing   Problem: Education: Goal: Will be free of psychotic symptoms Outcome: Progressing Goal: Knowledge of the prescribed therapeutic regimen will improve Outcome: Progressing   Problem: Coping: Goal: Coping ability will improve Outcome: Progressing Goal: Will verbalize feelings Outcome: Progressing   Problem: Health Behavior/Discharge Planning: Goal: Compliance with prescribed medication regimen will improve Outcome: Progressing   Problem: Nutritional: Goal: Ability to achieve adequate nutritional intake will improve Outcome: Progressing   Problem: Health Behavior/Discharge Planning: Goal: Compliance with prescribed medication regimen will improve Outcome: Progressing   Problem: Role Relationship: Goal: Ability to communicate needs accurately will improve Outcome: Progressing Goal: Ability to interact with others will improve Outcome: Progressing   Problem: Safety: Goal: Ability to redirect hostility and anger into socially appropriate behaviors will improve Outcome: Progressing Goal: Ability to remain free from injury will improve Outcome: Progressing   Problem: Self-Care: Goal: Ability to participate in self-care as condition permits will improve Outcome: Progressing   Problem: Self-Concept: Goal: Will verbalize positive feelings about self Outcome: Progressing   Problem: Consults Goal: Concurrent Medical Patient Education Description: (See Patient Education Module for education specifics) Outcome: Progressing   Problem: [facility] Concurrent Medical Problem Goal: LTG-Pt will be physically stable and he/significant other Description: (Patient will be physically stable and he/significant other will be able to verbalize understanding of follow-up care and symptoms that would warrant further treatment) Outcome: Progressing Goal: STG-Vital signs  will be within defined limits or stabilized Description: (STG- Vital signs will be within defined limits or stabilized for individual) Outcome: Progressing Goal: STG-Compliance with medication and/or treatment as ordered Description: (STG-Compliance with medication and/or treatment as ordered by MD) Outcome: Progressing Goal: STG-Verbalize two symptoms that would warrant further Description: (STG-Verbalize two symptoms that would warrant further treatment) Outcome: Progressing Goal: STG-Patient will participate in management/stabilization Description: (STG-Patient will participate in management/stabilization of medical condition) Outcome: Progressing Goal: STG-Other (Specify): Description: STG-Other Concurrent Medical (Specify): Outcome: Progressing   Problem: [facility] Concurrent Medical Problem Goal: LTG-Pt will be physically stable and he/significant other Description: (Patient will be physically stable and he/significant other will be able to verbalize understanding of follow-up care and symptoms that would warrant further treatment) Outcome: Progressing Goal: STG-Vital signs will be within defined limits or stabilized Description: (STG- Vital signs will be within defined limits or stabilized for individual) Outcome: Progressing Goal: STG-Compliance with medication and/or treatment as ordered Description: (STG-Compliance with medication and/or treatment as ordered by MD) Outcome: Progressing Goal: STG-Verbalize two symptoms that would warrant further Description: (STG-Verbalize two symptoms that would warrant further treatment) Outcome: Progressing Goal: STG-Patient will participate in management/stabilization Description: (STG-Patient will participate in management/stabilization of medical condition) Outcome: Progressing Goal: STG-Other (Specify): Description: STG-Other Concurrent Medical (Specify): Outcome: Progressing

## 2021-07-17 NOTE — Progress Notes (Signed)
[facility] MD Progress Note  07/17/2021 2:09 PM David Davila.  MRN:  [id number] Subjective: Follow-up 40 year old man with schizophrenia.  Patient is only complaining that he is not getting the correct food on his tray.  I spoke with staff and confirmed that he is actually correct about that.  They are still not fulfilling the request for a vegan diet.  He is not eating very much.  Does not interact very much.  At times responding to internal stimuli but not aggressively bizarre.  Has been compliant with medicine.  No evident side effects Principal Problem: Schizophrenia, paranoid type (HCC) Diagnosis: Principal Problem:   Schizophrenia, paranoid type (HCC) Active Problems:   Noncompliance with medication regimen   TBI (traumatic brain injury)  Total Time spent with patient: 30 minutes  Past Psychiatric History: Past history of schizophrenia multiple prior presentations.  Past Medical History:  Past Medical History:  Diagnosis Date   Acute psychosis (HCC)    Antisocial personality disorder (HCC)    Cannabis abuse    Past   Paranoid schizophrenia (HCC)    Schizoaffective disorder, bipolar type (HCC)    History reviewed. No pertinent surgical history. Family History: History reviewed. No pertinent family history. Family Psychiatric  History: See previous Social History:  Social History   Substance and Sexual Activity  Alcohol Use No     Social History   Substance and Sexual Activity  Drug Use Not Currently    Social History   Socioeconomic History   Marital status: Single    Spouse name: Not on file   Number of children: Not on file   Years of education: Not on file   Highest education level: Not on file  Occupational History   Not on file  Tobacco Use   Smoking status: Every Day    Packs/day: 1.00    Types: Cigarettes   Smokeless tobacco: Never  Vaping Use   Vaping Use: Unknown  Substance and Sexual Activity   Alcohol use: No   Drug use: Not Currently    Sexual activity: Not Currently  Other Topics Concern   Not on file  Social History Narrative   Not on file   Social Determinants of Health   Financial Resource Strain: Not on file  Food Insecurity: Not on file  Transportation Needs: Not on file  Physical Activity: Not on file  Stress: Not on file  Social Connections: Not on file   Additional Social History:                         Sleep: Fair  Appetite:  Fair  Current Medications: Current Facility-Administered Medications  Medication Dose Route Frequency Provider Last Rate Last Admin   acetaminophen (TYLENOL) tablet 650 mg  650 mg Oral Q6H PRN Charm Rings, NP       alum & mag hydroxide-simeth (MAALOX/MYLANTA) 200-200-20 MG/5ML suspension 30 mL  30 mL Oral Q4H PRN Charm Rings, NP       atorvastatin (LIPITOR) tablet 10 mg  10 mg Oral q1800 Deal, Adonis Housekeeper, RPH   10 mg at 07/16/21 2122   benztropine (COGENTIN) tablet 1 mg  1 mg Oral BID Charm Rings, NP   1 mg at 07/17/21 1201   divalproex (DEPAKOTE) DR tablet 1,000 mg  1,000 mg Oral Blenda Mounts, NP   1,000 mg at 07/17/21 1201   haloperidol (HALDOL) tablet 2 mg  2 mg Oral BID Yitzchok Carriger, Jackquline Denmark,  MD       magnesium hydroxide (MILK OF MAGNESIA) suspension 30 mL  30 mL Oral Daily PRN Charm Rings, NP       mirtazapine (REMERON) tablet 15 mg  15 mg Oral QHS Charm Rings, NP   15 mg at 07/16/21 2122   OLANZapine (ZYPREXA) tablet 15 mg  15 mg Oral BID Tahj Lindseth, Jackquline Denmark, MD   15 mg at 07/17/21 1201   traZODone (DESYREL) tablet 100 mg  100 mg Oral QHS PRN Charm Rings, NP   100 mg at 07/13/21 2216    Lab Results: No results found for this or any previous visit (from the past 48 hour(s)).  Blood Alcohol level:  Lab Results  Component Value Date   ETH <10 06/14/2021   ETH <10 04/13/2021    Metabolic Disorder Labs: Lab Results  Component Value Date   HGBA1C 5.3 06/29/2021   MPG 105.41 06/29/2021   MPG 108.28 03/25/2018   No results found for:  PROLACTIN Lab Results  Component Value Date   CHOL 210 (H) 06/29/2021   TRIG 74 06/29/2021   HDL 60 06/29/2021   CHOLHDL 3.5 06/29/2021   VLDL 15 06/29/2021   LDLCALC 135 (H) 06/29/2021   LDLCALC 125 (H) 03/25/2018    Physical Findings: AIMS: Facial and Oral Movements Muscles of Facial Expression: None, normal Lips and Perioral Area: None, normal Jaw: None, normal Tongue: None, normal,Extremity Movements Upper (arms, wrists, hands, fingers): None, normal Lower (legs, knees, ankles, toes): None, normal, Trunk Movements Neck, shoulders, hips: None, normal, Overall Severity Severity of abnormal movements (highest score from questions above): None, normal Incapacitation due to abnormal movements: None, normal Patient's awareness of abnormal movements (rate only patient's report): No Awareness, Dental Status Current problems with teeth and/or dentures?: No Does patient usually wear dentures?: No  CIWA:    COWS:     Musculoskeletal: Strength & Muscle Tone: within normal limits Gait & Station: normal Patient leans: N/A  Psychiatric Specialty Exam:  Presentation  General Appearance: Casual; Neat  Eye Contact:Fleeting  Speech:Garbled  Speech Volume:Decreased  Handedness:Right   Mood and Affect  Mood:Anxious; Irritable  Affect:Constricted   Thought Process  Thought Processes:Disorganized; Irrevelant  Descriptions of Associations:Loose  Orientation:Partial  Thought Content:Paranoid Ideation; Illogical  History of Schizophrenia/Schizoaffective disorder:Yes  Duration of Psychotic Symptoms:Greater than six months  Hallucinations:No data recorded Ideas of Reference:Paranoia  Suicidal Thoughts:No data recorded Homicidal Thoughts:No data recorded  Sensorium  Memory:Immediate Good  Judgment:Impaired  Insight:Poor   Executive Functions  Concentration:Poor  Attention Span:Poor  Recall:Fair  Fund of Knowledge:Fair  Language:Fair   Psychomotor  Activity  Psychomotor Activity:No data recorded  Assets  Assets:Physical Health   Sleep  Sleep:No data recorded   Physical Exam: Physical Exam Vitals and nursing note reviewed.  Constitutional:      Appearance: Normal appearance.  HENT:     Head: Normocephalic and atraumatic.     Mouth/Throat:     Pharynx: Oropharynx is clear.  Eyes:     Pupils: Pupils are equal, round, and reactive to light.  Cardiovascular:     Rate and Rhythm: Normal rate and regular rhythm.  Pulmonary:     Effort: Pulmonary effort is normal.     Breath sounds: Normal breath sounds.  Abdominal:     General: Abdomen is flat.     Palpations: Abdomen is soft.  Musculoskeletal:        General: Normal range of motion.  Skin:    General: Skin is warm  and dry.  Neurological:     General: No focal deficit present.     Mental Status: He is alert. Mental status is at baseline.  Psychiatric:        Attention and Perception: He is inattentive.        Mood and Affect: Mood normal. Affect is blunt.        Speech: Speech is delayed and tangential.        Behavior: Behavior is slowed.        Thought Content: Thought content is delusional.        Cognition and Memory: Cognition is impaired.        Judgment: Judgment is impulsive.   Review of Systems  Constitutional: Negative.   HENT: Negative.    Eyes: Negative.   Respiratory: Negative.    Cardiovascular: Negative.   Gastrointestinal: Negative.   Musculoskeletal: Negative.   Skin: Negative.   Neurological: Negative.   Psychiatric/Behavioral:  Negative for depression, hallucinations, memory loss, substance abuse and suicidal ideas. The patient is not nervous/anxious and does not have insomnia.   Blood pressure 119/83, pulse 79, temperature 97.9 F (36.6 C), temperature source Oral, resp. rate 18, height 5\' 10"  (1.778 m), weight 55.8 kg, SpO2 98 %. Body mass index is 17.65 kg/m.   Treatment Plan Summary: Medication management and Plan because he is now  on the long-acting Invega and I will continue to cut down the haloperidol decreasing the dose to 2 mg twice a day.  Encourage group attendance.  Patient may be pretty much near his baseline at this point.  Treatment team has been trying to work on a discharge plan.  We have been told that he is to return to the jail but we have no paperwork or order of that sort and have not received word from the jail.  , MD 07/17/2021, 2:09 PM

## 2021-07-17 NOTE — Plan of Care (Signed)
?  Problem: Education: ?Goal: Knowledge of the prescribed therapeutic regimen will improve ?Outcome: Progressing ?  ?Problem: Coping: ?Goal: Coping ability will improve ?Outcome: Progressing ?Goal: Will verbalize feelings ?Outcome: Progressing ?  ?

## 2021-07-17 NOTE — [facility] Counselor (Signed)
CSW spoke with pt's guardian.  CSW again requested documentation that patient is to be released to the police if that is the case.  He reports that he does not have the order and the lawyer has not yet responded.  He requests that we follow up with the [facility] to identify the paperwork.   He reports that discharging home or to the street are not safe discharges at this time.   He reports that he will be in court for the next 3 weeks and as a result may be hard to get a hold of, however, requested that CSW text  in emergency situations.  Penni Homans, MSW, LCSW 07/17/2021 9:51 AM

## 2021-07-17 NOTE — Progress Notes (Signed)
Recreation Therapy Notes   Date: 07/17/2021  Time: 10:00 am      Location: Craft room    Behavioral response: N/A   Intervention Topic: Goals   Discussion/Intervention: Patient did not attend group.   Clinical Observations/Feedback:  Patient did not attend group.    Tamanna Whitson LRT/CTRS        Dana Debo 07/17/2021 12:00 PM

## 2021-07-17 NOTE — Progress Notes (Signed)
Patient refused breakfast this am and is resting in bed. RN prompted patient to get up yet patient refused. Encouraged patient to come and get medications when awake and out of bed.

## 2021-07-17 NOTE — [facility] Counselor (Signed)
CSW attempted to call Verl Bangs, [phone] with the [facility].  CSW left HIPAA compliant voicemail.   Penni Homans, MSW, LCSW 07/17/2021 3:53 PM

## 2021-07-17 NOTE — Group Note (Signed)
[facility] LCSW Group Therapy Note   Group Date: 07/17/2021 Start Time: 1300 End Time: 1355  Type of Therapy/Topic:  Group Therapy:  Feelings about Diagnosis  Participation Level:  None    Description of Group:    This group will allow patients to explore their thoughts and feelings about diagnoses they have received. Patients will be guided to explore their level of understanding and acceptance of these diagnoses. Facilitator will encourage patients to process their thoughts and feelings about the reactions of others to their diagnosis, and will guide patients in identifying ways to discuss their diagnosis with significant others in their lives. This group will be process-oriented, with patients participating in exploration of their own experiences as well as giving and receiving support and challenge from other group members.   Therapeutic Goals: 1. Patient will demonstrate understanding of diagnosis as evidence by identifying two or more symptoms of the disorder:  2. Patient will be able to express two feelings regarding the diagnosis 3. Patient will demonstrate ability to communicate their needs through discussion and/or role plays  Summary of Patient Progress: Patient came in and left within the first ten minutes. He did not contribute to the conversation even when questions were directed towards him.   Therapeutic Modalities:   Cognitive Behavioral Therapy Brief Therapy Feelings Identification    Glenis Smoker, LCSW

## 2021-07-17 NOTE — Progress Notes (Signed)
Patient isolates most of the day. Comes out this afternoon. Refuses breakfast but eats lunch and dinner.   Denies SI/HI/AH/VH, depression, pain and anxiety.   Originally refuses to take medications this am. Takes them later this afternoon.  No adverse reactions noted.    Refuses vitals. Will cont to monitor for safety.

## 2021-07-18 DIAGNOSIS — F200 Paranoid schizophrenia: Secondary | ICD-10-CM | POA: Diagnosis not present

## 2021-07-18 NOTE — Plan of Care (Signed)
  Problem: Education: Goal: Will be free of psychotic symptoms Outcome: Progressing Goal: Knowledge of the prescribed therapeutic regimen will improve Outcome: Progressing   Problem: Coping: Goal: Coping ability will improve Outcome: Progressing   Problem: Health Behavior/Discharge Planning: Goal: Compliance with prescribed medication regimen will improve Outcome: Progressing   Problem: Safety: Goal: Ability to redirect hostility and anger into socially appropriate behaviors will improve Outcome: Progressing

## 2021-07-18 NOTE — Progress Notes (Signed)
Patient remains in bed this am despite prompting from RN.  Refuses meals and medications. Patient encouraged to speak with nurse when he is ready to take medications and informed RN to try again later. Will cont to monitor for safety.

## 2021-07-18 NOTE — Group Note (Signed)
[facility] LCSW Group Therapy Note   Group Date: 07/18/2021 Start Time: 1300 End Time: 1400   Type of Therapy/Topic:  Group Therapy:  Emotion Regulation  Participation Level:  Did Not Attend   Mood:  Description of Group:    The purpose of this group is to assist patients in learning to regulate negative emotions and experience positive emotions. Patients will be guided to discuss ways in which they have been vulnerable to their negative emotions. These vulnerabilities will be juxtaposed with experiences of positive emotions or situations, and patients challenged to use positive emotions to combat negative ones. Special emphasis will be placed on coping with negative emotions in conflict situations, and patients will process healthy conflict resolution skills.  Therapeutic Goals: Patient will identify two positive emotions or experiences to reflect on in order to balance out negative emotions:  Patient will label two or more emotions that they find the most difficult to experience:  Patient will be able to demonstrate positive conflict resolution skills through discussion or role plays:   Summary of Patient Progress:  Patient did not attend group despite encouraged participation.     Therapeutic Modalities:   Cognitive Behavioral Therapy Feelings Identification Dialectical Behavioral Therapy   Corky Crafts, [place]

## 2021-07-18 NOTE — Progress Notes (Signed)
Patient refused night evening medications as well.

## 2021-07-18 NOTE — [facility] Counselor (Signed)
CSW has received the reqwuested paperwork from Lt. Zadie Rhine.  CSW will scan to medical records and provide copy to Behavior Medicine Unit.   Penni Homans, MSW, LCSW 07/18/2021 1:14 PM

## 2021-07-18 NOTE — Progress Notes (Signed)
Patient remains isolative and withdrawn most of the shift. Comes out of the room during meal time but does not eat any meals. Paces most o the afternoon. RN attempted to administer medication to patient two additional times yet patient refused.   Refused to answer high risk assessment questions to RN today.   Will continue to monitor for safety.

## 2021-07-18 NOTE — Plan of Care (Signed)
  Problem: Activity: Goal: Will verbalize the importance of balancing activity with adequate rest periods Outcome: Progressing   Problem: Education: Goal: Will be free of psychotic symptoms Outcome: Progressing Goal: Knowledge of the prescribed therapeutic regimen will improve Outcome: Progressing   Problem: Coping: Goal: Coping ability will improve Outcome: Progressing Goal: Will verbalize feelings Outcome: Progressing   Problem: Coping: Goal: Coping ability will improve Outcome: Progressing Goal: Will verbalize feelings Outcome: Progressing   Problem: Health Behavior/Discharge Planning: Goal: Compliance with prescribed medication regimen will improve Outcome: Progressing   Problem: Nutritional: Goal: Ability to achieve adequate nutritional intake will improve Outcome: Progressing   Problem: Role Relationship: Goal: Ability to communicate needs accurately will improve Outcome: Progressing Goal: Ability to interact with others will improve Outcome: Progressing   Problem: Safety: Goal: Ability to redirect hostility and anger into socially appropriate behaviors will improve Outcome: Progressing Goal: Ability to remain free from injury will improve Outcome: Progressing   Problem: Self-Care: Goal: Ability to participate in self-care as condition permits will improve Outcome: Progressing   Problem: Self-Concept: Goal: Will verbalize positive feelings about self Outcome: Progressing   Problem: Consults Goal: Concurrent Medical Patient Education Description: (See Patient Education Module for education specifics) Outcome: Progressing   Problem: [facility] Concurrent Medical Problem Goal: LTG-Pt will be physically stable and he/significant other Description: (Patient will be physically stable and he/significant other will be able to verbalize understanding of follow-up care and symptoms that would warrant further treatment) Outcome: Progressing Goal: STG-Vital signs will be  within defined limits or stabilized Description: (STG- Vital signs will be within defined limits or stabilized for individual) Outcome: Progressing Goal: STG-Compliance with medication and/or treatment as ordered Description: (STG-Compliance with medication and/or treatment as ordered by MD) Outcome: Progressing Goal: STG-Verbalize two symptoms that would warrant further Description: (STG-Verbalize two symptoms that would warrant further treatment) Outcome: Progressing Goal: STG-Patient will participate in management/stabilization Description: (STG-Patient will participate in management/stabilization of medical condition) Outcome: Progressing Goal: STG-Other (Specify): Description: STG-Other Concurrent Medical (Specify): Outcome: Progressing   Problem: [facility] Concurrent Medical Problem Goal: LTG-Pt will be physically stable and he/significant other Description: (Patient will be physically stable and he/significant other will be able to verbalize understanding of follow-up care and symptoms that would warrant further treatment) Outcome: Progressing Goal: STG-Vital signs will be within defined limits or stabilized Description: (STG- Vital signs will be within defined limits or stabilized for individual) Outcome: Progressing Goal: STG-Compliance with medication and/or treatment as ordered Description: (STG-Compliance with medication and/or treatment as ordered by MD) Outcome: Progressing Goal: STG-Verbalize two symptoms that would warrant further Description: (STG-Verbalize two symptoms that would warrant further treatment) Outcome: Progressing Goal: STG-Patient will participate in management/stabilization Description: (STG-Patient will participate in management/stabilization of medical condition) Outcome: Progressing Goal: STG-Other (Specify): Description: STG-Other Concurrent Medical (Specify): Outcome: Progressing

## 2021-07-18 NOTE — [facility] Counselor (Signed)
CSW spoke with Frederica Kuster, [phone].  CSW updated that paperwork was received.  CSW also informed that likely patient will be discharged on Monday 07/23/2021.  Penni Homans, MSW, LCSW 07/18/2021 4:25 PM

## 2021-07-18 NOTE — Progress Notes (Signed)
Recreation Therapy Notes  Date: 07/18/2021  Time: 10:15 am      Location: Craft room    Behavioral response: N/A   Intervention Topic: Creative expressions    Discussion/Intervention: Patient did not attend group.   Clinical Observations/Feedback:  Patient did not attend group.    Sabel Hornbeck LRT/CTRS        David Davila 07/18/2021 12:25 PM 

## 2021-07-18 NOTE — [facility] Counselor (Addendum)
ADDENDUM CSW received a return call from Verona Loftis. He reports that the paperwork that is needed has to be generated by the patient's lawyer.  He reports that he will reach out to the lawyer for additional support and then follow up.    Penni Homans, MSW, LCSW 07/18/2021 12:43 PM      CSW followed up with Verl Bangs, [phone] with the [facility]Sheriff's Office.  He reports that he will need to follow up with some individuals and call this CSW back with the information.  He was unsure of what paperwork was needed.    CSW notes that she was provided the name and contact of Captain Loftis by [employer] [facility] [employer] Supervisor, Loraine Leriche.  CSW had contacted [facility] Supervisor for clarification on if CSW could contact [facility] at the request of the guardian.  [facility] Supervisor made calls and provided this CSW with the above information.  CSW will follow up with the guardian.  Guardian has provided verbal consent for CSW to speak with the  [facility] [employer].  Penni Homans, MSW, LCSW 07/18/2021 12:15 PM

## 2021-07-18 NOTE — Progress Notes (Signed)
Patient presents with flat affect, poor eye contact. Isolates to self and room, comes out for meds and snacks. No complaints or concerns voiced. Denies SI, Hi, AVH. Encouragement and support provided. Safety checks maintained. Medications given as prescribed. Pt receptive and remains safe on unit with q 15 min checks.

## 2021-07-18 NOTE — Progress Notes (Signed)
[facility] MD Progress Note  07/18/2021 3:53 PM David Davila.  MRN:  [id number] Subjective: Follow-up for this 40 year old man with chronic psychiatric problems.  Patient was in bed but awake as he often is in the afternoon.  Again he wanted to talk about his diet.  Today he is back to telling me that he does not believe in eating anything at all.  He tells me that he believes that it is against his religion to eat anything including animal products but also including plants or anything else.  Patient says he does not believe that he will die if he stops eating and that in fact he believes it is the only way that he can fulfill his religion.  All of this with a straight face.  Staff report he still does eat a little bit although it is intermittent.  He has not been aggressive to anyone.  He was able to carry on a bit of a conversation about going back to jail.  He tells me that he really does not care whether he goes back to jail or not.  Denies any wish to die or any suicidal thoughts. Principal Problem: Schizophrenia, paranoid type (HCC) Diagnosis: Principal Problem:   Schizophrenia, paranoid type (HCC) Active Problems:   Noncompliance with medication regimen   TBI (traumatic brain injury)  Total Time spent with patient: 30 minutes  Past Psychiatric History: Long history of chronic psychotic disorder.  Frequent med noncompliance.  Multiple hospitalizations.  According to the patient disbelief and not eating has been a recurrent thing and there have been times in the past that people have threatened to force feed him  Past Medical History:  Past Medical History:  Diagnosis Date   Acute psychosis (HCC)    Antisocial personality disorder (HCC)    Cannabis abuse    Past   Paranoid schizophrenia (HCC)    Schizoaffective disorder, bipolar type (HCC)    History reviewed. No pertinent surgical history. Family History: History reviewed. No pertinent family history. Family Psychiatric   History: Nothing known Social History:  Social History   Substance and Sexual Activity  Alcohol Use No     Social History   Substance and Sexual Activity  Drug Use Not Currently    Social History   Socioeconomic History   Marital status: Single    Spouse name: Not on file   Number of children: Not on file   Years of education: Not on file   Highest education level: Not on file  Occupational History   Not on file  Tobacco Use   Smoking status: Every Day    Packs/day: 1.00    Types: Cigarettes   Smokeless tobacco: Never  Vaping Use   Vaping Use: Unknown  Substance and Sexual Activity   Alcohol use: No   Drug use: Not Currently   Sexual activity: Not Currently  Other Topics Concern   Not on file  Social History Narrative   Not on file   Social Determinants of Health   Financial Resource Strain: Not on file  Food Insecurity: Not on file  Transportation Needs: Not on file  Physical Activity: Not on file  Stress: Not on file  Social Connections: Not on file   Additional Social History:                         Sleep: Fair  Appetite:  Poor  Current Medications: Current Facility-Administered Medications  Medication Dose Route Frequency  Provider Last Rate Last Admin   acetaminophen (TYLENOL) tablet 650 mg  650 mg Oral Q6H PRN Charm Rings, NP       alum & mag hydroxide-simeth (MAALOX/MYLANTA) 200-200-20 MG/5ML suspension 30 mL  30 mL Oral Q4H PRN Charm Rings, NP       atorvastatin (LIPITOR) tablet 10 mg  10 mg Oral q1800 Deal, Adonis Housekeeper, RPH   10 mg at 07/17/21 2058   benztropine (COGENTIN) tablet 1 mg  1 mg Oral BID Charm Rings, NP   1 mg at 07/17/21 1800   divalproex (DEPAKOTE) DR tablet 1,000 mg  1,000 mg Oral BH-q7a Lord, Jamison Y, NP   1,000 mg at 07/17/21 1201   haloperidol (HALDOL) tablet 2 mg  2 mg Oral BID Aubra Pappalardo T, MD   2 mg at 07/17/21 1800   magnesium hydroxide (MILK OF MAGNESIA) suspension 30 mL  30 mL Oral Daily PRN Charm Rings, NP       mirtazapine (REMERON) tablet 15 mg  15 mg Oral QHS Charm Rings, NP   15 mg at 07/17/21 2058   OLANZapine (ZYPREXA) tablet 15 mg  15 mg Oral BID Caily Rakers T, MD   15 mg at 07/17/21 1800   traZODone (DESYREL) tablet 100 mg  100 mg Oral QHS PRN Charm Rings, NP   100 mg at 07/17/21 2058    Lab Results: No results found for this or any previous visit (from the past 48 hour(s)).  Blood Alcohol level:  Lab Results  Component Value Date   ETH <10 06/14/2021   ETH <10 04/13/2021    Metabolic Disorder Labs: Lab Results  Component Value Date   HGBA1C 5.3 06/29/2021   MPG 105.41 06/29/2021   MPG 108.28 03/25/2018   No results found for: PROLACTIN Lab Results  Component Value Date   CHOL 210 (H) 06/29/2021   TRIG 74 06/29/2021   HDL 60 06/29/2021   CHOLHDL 3.5 06/29/2021   VLDL 15 06/29/2021   LDLCALC 135 (H) 06/29/2021   LDLCALC 125 (H) 03/25/2018    Physical Findings: AIMS: Facial and Oral Movements Muscles of Facial Expression: None, normal Lips and Perioral Area: None, normal Jaw: None, normal Tongue: None, normal,Extremity Movements Upper (arms, wrists, hands, fingers): None, normal Lower (legs, knees, ankles, toes): None, normal, Trunk Movements Neck, shoulders, hips: None, normal, Overall Severity Severity of abnormal movements (highest score from questions above): None, normal Incapacitation due to abnormal movements: None, normal Patient's awareness of abnormal movements (rate only patient's report): No Awareness, Dental Status Current problems with teeth and/or dentures?: No Does patient usually wear dentures?: No  CIWA:    COWS:     Musculoskeletal: Strength & Muscle Tone: within normal limits Gait & Station: normal Patient leans: N/A  Psychiatric Specialty Exam:  Presentation  General Appearance: Casual; Neat  Eye Contact:Fleeting  Speech:Garbled  Speech Volume:Decreased  Handedness:Right   Mood and Affect   Mood:Anxious; Irritable  Affect:Constricted   Thought Process  Thought Processes:Disorganized; Irrevelant  Descriptions of Associations:Loose  Orientation:Partial  Thought Content:Paranoid Ideation; Illogical  History of Schizophrenia/Schizoaffective disorder:Yes  Duration of Psychotic Symptoms:Greater than six months  Hallucinations:No data recorded Ideas of Reference:Paranoia  Suicidal Thoughts:No data recorded Homicidal Thoughts:No data recorded  Sensorium  Memory:Immediate Good  Judgment:Impaired  Insight:Poor   Executive Functions  Concentration:Poor  Attention Span:Poor  Recall:Fair  Fund of Knowledge:Fair  Language:Fair   Psychomotor Activity  Psychomotor Activity:No data recorded  Assets  Assets:Physical Health  Sleep  Sleep:No data recorded   Physical Exam: Physical Exam Vitals and nursing note reviewed.  Constitutional:      Appearance: Normal appearance.  HENT:     Head: Normocephalic and atraumatic.     Mouth/Throat:     Pharynx: Oropharynx is clear.  Eyes:     Pupils: Pupils are equal, round, and reactive to light.  Cardiovascular:     Rate and Rhythm: Normal rate and regular rhythm.  Pulmonary:     Effort: Pulmonary effort is normal.     Breath sounds: Normal breath sounds.  Abdominal:     General: Abdomen is flat.     Palpations: Abdomen is soft.  Musculoskeletal:        General: Normal range of motion.  Skin:    General: Skin is warm and dry.  Neurological:     General: No focal deficit present.     Mental Status: He is alert. Mental status is at baseline.  Psychiatric:        Attention and Perception: Attention normal.        Mood and Affect: Mood normal. Affect is blunt.        Speech: Speech is delayed.        Behavior: Behavior is slowed.        Thought Content: Thought content is delusional.        Cognition and Memory: Cognition is impaired.        Judgment: Judgment is inappropriate.   Review of  Systems  Constitutional: Negative.   HENT: Negative.    Eyes: Negative.   Respiratory: Negative.    Cardiovascular: Negative.   Gastrointestinal: Negative.   Musculoskeletal: Negative.   Skin: Negative.   Neurological: Negative.   Psychiatric/Behavioral: Negative.    Blood pressure 119/83, pulse 79, temperature 97.9 F (36.6 C), temperature source Oral, resp. rate 18, height 5\' 10"  (1.778 m), weight 55.8 kg, SpO2 98 %. Body mass index is 17.65 kg/m.   Treatment Plan Summary: Medication management and Plan nothing I could say was going to convince him that his claimed that he should not eat food made any sense.  Reviewed all of this with the treatment team.  I do not think he is being manipulative in any conscious way.  He does tell me he does not mind if he goes back to jail and he denies any suicidal thought.  No change to medicine today.  We are going to start working on plans to see how we can get him discharged soon since he will be going back to the jail system  , MD 07/18/2021, 3:53 PM

## 2021-07-19 NOTE — Plan of Care (Addendum)
Problem: Activity: Goal: Will verbalize the importance of balancing activity with adequate rest periods 07/19/2021 1925 by Angeline Slim, RN Outcome: Not Progressing 07/19/2021 1536 by Angeline Slim, RN Outcome: Not Progressing   Problem: Education: Goal: Will be free of psychotic symptoms 07/19/2021 1925 by Angeline Slim, RN Outcome: Not Progressing 07/19/2021 1536 by Angeline Slim, RN Outcome: Not Progressing Goal: Knowledge of the prescribed therapeutic regimen will improve 07/19/2021 1925 by Angeline Slim, RN Outcome: Not Progressing 07/19/2021 1536 by Angeline Slim, RN Outcome: Not Progressing   Problem: Coping: Goal: Coping ability will improve 07/19/2021 1925 by Angeline Slim, RN Outcome: Not Progressing 07/19/2021 1536 by Angeline Slim, RN Outcome: Not Progressing Goal: Will verbalize feelings 07/19/2021 1925 by Angeline Slim, RN Outcome: Not Progressing 07/19/2021 1536 by Angeline Slim, RN Outcome: Not Progressing   Problem: Health Behavior/Discharge Planning: Goal: Compliance with prescribed medication regimen will improve 07/19/2021 1925 by Angeline Slim, RN Outcome: Not Progressing 07/19/2021 1536 by Angeline Slim, RN Outcome: Not Progressing   Problem: Nutritional: Goal: Ability to achieve adequate nutritional intake will improve 07/19/2021 1925 by Angeline Slim, RN Outcome: Not Progressing 07/19/2021 1536 by Angeline Slim, RN Outcome: Not Progressing   Problem: Role Relationship: Goal: Ability to communicate needs accurately will improve 07/19/2021 1925 by Angeline Slim, RN Outcome: Not Progressing 07/19/2021 1536 by Angeline Slim, RN Outcome: Not Progressing Goal: Ability to interact with others will improve 07/19/2021 1925 by Angeline Slim, RN Outcome: Not Progressing 07/19/2021 1536 by Angeline Slim, RN Outcome: Not Progressing   Problem: Safety: Goal: Ability to redirect hostility and  anger into socially appropriate behaviors will improve 07/19/2021 1925 by Angeline Slim, RN Outcome: Not Progressing 07/19/2021 1536 by Angeline Slim, RN Outcome: Not Progressing Goal: Ability to remain free from injury will improve 07/19/2021 1925 by Angeline Slim, RN Outcome: Not Progressing 07/19/2021 1536 by Angeline Slim, RN Outcome: Not Progressing   Problem: Self-Care: Goal: Ability to participate in self-care as condition permits will improve 07/19/2021 1925 by Angeline Slim, RN Outcome: Not Progressing 07/19/2021 1536 by Angeline Slim, RN Outcome: Not Progressing   Problem: Self-Concept: Goal: Will verbalize positive feelings about self 07/19/2021 1925 by Angeline Slim, RN Outcome: Not Progressing 07/19/2021 1536 by Angeline Slim, RN Outcome: Not Progressing   Problem: Consults Goal: Concurrent Medical Patient Education Description: (See Patient Education Module for education specifics) 07/19/2021 1925 by Angeline Slim, RN Outcome: Not Progressing 07/19/2021 1536 by Angeline Slim, RN Outcome: Not Progressing   Problem: [facility] Concurrent Medical Problem Goal: LTG-Pt will be physically stable and he/significant other Description: (Patient will be physically stable and he/significant other will be able to verbalize understanding of follow-up care and symptoms that would warrant further treatment) 07/19/2021 1925 by Angeline Slim, RN Outcome: Not Progressing 07/19/2021 1536 by Angeline Slim, RN Outcome: Not Progressing Goal: STG-Vital signs will be within defined limits or stabilized Description: (STG- Vital signs will be within defined limits or stabilized for individual) 07/19/2021 1925 by Angeline Slim, RN Outcome: Not Progressing 07/19/2021 1536 by Angeline Slim, RN Outcome: Not Progressing Goal: STG-Compliance with medication and/or treatment as ordered Description: (STG-Compliance with medication and/or treatment as  ordered by MD) 07/19/2021 1925 by Angeline Slim, RN Outcome: Not Progressing 07/19/2021 1536 by Angeline Slim, RN Outcome: Not Progressing Goal: STG-Verbalize two symptoms that would warrant further Description: (STG-Verbalize two symptoms that would warrant further treatment) 07/19/2021 1925 by Angeline Slim, RN Outcome: Not Progressing 07/19/2021 1536 by Angeline Slim, RN Outcome: Not Progressing Goal: STG-Patient will participate in management/stabilization Description: (STG-Patient will participate in  management/stabilization of medical condition) 07/19/2021 1925 by Angeline Slim, RN Outcome: Not Progressing 07/19/2021 1536 by Angeline Slim, RN Outcome: Not Progressing Goal: STG-Other Joylene Draft): Description: STG-Other Concurrent Medical (Specify): 07/19/2021 1925 by Angeline Slim, RN Outcome: Not Progressing 07/19/2021 1536 by Angeline Slim, RN Outcome: Not Progressing   Problem: [facility] Concurrent Medical Problem Goal: LTG-Pt will be physically stable and he/significant other Description: (Patient will be physically stable and he/significant other will be able to verbalize understanding of follow-up care and symptoms that would warrant further treatment) 07/19/2021 1925 by Angeline Slim, RN Outcome: Not Progressing 07/19/2021 1536 by Angeline Slim, RN Outcome: Not Progressing Goal: STG-Vital signs will be within defined limits or stabilized Description: (STG- Vital signs will be within defined limits or stabilized for individual) 07/19/2021 1925 by Angeline Slim, RN Outcome: Not Progressing 07/19/2021 1536 by Angeline Slim, RN Outcome: Not Progressing Goal: STG-Compliance with medication and/or treatment as ordered Description: (STG-Compliance with medication and/or treatment as ordered by MD) 07/19/2021 1925 by Angeline Slim, RN Outcome: Not Progressing 07/19/2021 1536 by Angeline Slim, RN Outcome: Not Progressing Goal:  STG-Verbalize two symptoms that would warrant further Description: (STG-Verbalize two symptoms that would warrant further treatment) 07/19/2021 1925 by Angeline Slim, RN Outcome: Not Progressing 07/19/2021 1536 by Angeline Slim, RN Outcome: Not Progressing Goal: STG-Patient will participate in management/stabilization Description: (STG-Patient will participate in management/stabilization of medical condition) 07/19/2021 1925 by Angeline Slim, RN Outcome: Not Progressing 07/19/2021 1536 by Angeline Slim, RN Outcome: Not Progressing Goal: STG-Other Joylene Draft): Description: STG-Other Concurrent Medical (Specify): 07/19/2021 1925 by Angeline Slim, RN Outcome: Not Progressing 07/19/2021 1536 by Angeline Slim, RN Outcome: Not Progressing

## 2021-07-19 NOTE — Progress Notes (Signed)
Recreation Therapy Notes    Date: 07/19/2021  Time: 9:50 pm     Location:  Craft room    Behavioral response: N/A   Intervention Topic: Coping Skills   Discussion/Intervention: Patient did not attend group.  Clinical Observations/Feedback:  Patient did not attend group.   [name] LRT/CTRS          David Davila 07/19/2021 11:36 AM

## 2021-07-19 NOTE — Progress Notes (Signed)
Patient remained in the room for most of the morning. Refused medication and meals throughout the day. Paced the halls this afternoon. Refused to talk to nurse to answer high risk assessment questions. Disheveled in appearance. Will cont to monitor for safety.

## 2021-07-19 NOTE — Progress Notes (Signed)
Patient refused vital signs 

## 2021-07-19 NOTE — Progress Notes (Signed)
[facility] MD Progress Note  07/19/2021 2:52 PM David Davila.  MRN:  [id number] Subjective: Following up with this patient with schizophrenia.  He does get up out of his room walks around the hallway but not in a super agitated way.  I do not think he is a pathetic I think it is just that he does not feel comfortable actually sitting down and socializing.  Patient stopped and conversed with me but he speaks very little.  Continues to say that he does not believe in eating.  This seems to be a chronic issue for him but he is not apparently starving to death.  Intermittently refusing medicine.  No new complaints Principal Problem: Schizophrenia, paranoid type (HCC) Diagnosis: Principal Problem:   Schizophrenia, paranoid type (HCC) Active Problems:   Noncompliance with medication regimen   TBI (traumatic brain injury)  Total Time spent with patient: 30 minutes  Past Psychiatric History: Past history of longstanding schizophrenia also a history of head injury.  Chronic poor functioning  Past Medical History:  Past Medical History:  Diagnosis Date   Acute psychosis (HCC)    Antisocial personality disorder (HCC)    Cannabis abuse    Past   Paranoid schizophrenia (HCC)    Schizoaffective disorder, bipolar type (HCC)    History reviewed. No pertinent surgical history. Family History: History reviewed. No pertinent family history. Family Psychiatric  History: See previous Social History:  Social History   Substance and Sexual Activity  Alcohol Use No     Social History   Substance and Sexual Activity  Drug Use Not Currently    Social History   Socioeconomic History   Marital status: Single    Spouse name: Not on file   Number of children: Not on file   Years of education: Not on file   Highest education level: Not on file  Occupational History   Not on file  Tobacco Use   Smoking status: Every Day    Packs/day: 1.00    Types: Cigarettes   Smokeless tobacco: Never   Vaping Use   Vaping Use: Unknown  Substance and Sexual Activity   Alcohol use: No   Drug use: Not Currently   Sexual activity: Not Currently  Other Topics Concern   Not on file  Social History Narrative   Not on file   Social Determinants of Health   Financial Resource Strain: Not on file  Food Insecurity: Not on file  Transportation Needs: Not on file  Physical Activity: Not on file  Stress: Not on file  Social Connections: Not on file   Additional Social History:                         Sleep: Fair  Appetite:  Poor  Current Medications: Current Facility-Administered Medications  Medication Dose Route Frequency Provider Last Rate Last Admin   acetaminophen (TYLENOL) tablet 650 mg  650 mg Oral Q6H PRN Charm Rings, NP       alum & mag hydroxide-simeth (MAALOX/MYLANTA) 200-200-20 MG/5ML suspension 30 mL  30 mL Oral Q4H PRN Charm Rings, NP       atorvastatin (LIPITOR) tablet 10 mg  10 mg Oral q1800 Deal, Adonis Housekeeper, RPH   10 mg at 07/17/21 2058   benztropine (COGENTIN) tablet 1 mg  1 mg Oral BID Charm Rings, NP   1 mg at 07/17/21 1800   divalproex (DEPAKOTE) DR tablet 1,000 mg  1,000 mg Oral  Blenda Mounts, NP   1,000 mg at 07/17/21 1201   haloperidol (HALDOL) tablet 2 mg  2 mg Oral BID Madysyn Hanken T, MD   2 mg at 07/17/21 1800   magnesium hydroxide (MILK OF MAGNESIA) suspension 30 mL  30 mL Oral Daily PRN Charm Rings, NP       mirtazapine (REMERON) tablet 15 mg  15 mg Oral QHS Charm Rings, NP   15 mg at 07/17/21 2058   OLANZapine (ZYPREXA) tablet 15 mg  15 mg Oral BID Ronetta Molla T, MD   15 mg at 07/17/21 1800   traZODone (DESYREL) tablet 100 mg  100 mg Oral QHS PRN Charm Rings, NP   100 mg at 07/17/21 2058    Lab Results: No results found for this or any previous visit (from the past 48 hour(s)).  Blood Alcohol level:  Lab Results  Component Value Date   ETH <10 06/14/2021   ETH <10 04/13/2021    Metabolic Disorder  Labs: Lab Results  Component Value Date   HGBA1C 5.3 06/29/2021   MPG 105.41 06/29/2021   MPG 108.28 03/25/2018   No results found for: PROLACTIN Lab Results  Component Value Date   CHOL 210 (H) 06/29/2021   TRIG 74 06/29/2021   HDL 60 06/29/2021   CHOLHDL 3.5 06/29/2021   VLDL 15 06/29/2021   LDLCALC 135 (H) 06/29/2021   LDLCALC 125 (H) 03/25/2018    Physical Findings: AIMS: Facial and Oral Movements Muscles of Facial Expression: None, normal Lips and Perioral Area: None, normal Jaw: None, normal Tongue: None, normal,Extremity Movements Upper (arms, wrists, hands, fingers): None, normal Lower (legs, knees, ankles, toes): None, normal, Trunk Movements Neck, shoulders, hips: None, normal, Overall Severity Severity of abnormal movements (highest score from questions above): None, normal Incapacitation due to abnormal movements: None, normal Patient's awareness of abnormal movements (rate only patient's report): No Awareness, Dental Status Current problems with teeth and/or dentures?: No Does patient usually wear dentures?: No  CIWA:    COWS:     Musculoskeletal: Strength & Muscle Tone: within normal limits Gait & Station: normal Patient leans: N/A  Psychiatric Specialty Exam:  Presentation  General Appearance: Casual; Neat  Eye Contact:Fleeting  Speech:Garbled  Speech Volume:Decreased  Handedness:Right   Mood and Affect  Mood:Anxious; Irritable  Affect:Constricted   Thought Process  Thought Processes:Disorganized; Irrevelant  Descriptions of Associations:Loose  Orientation:Partial  Thought Content:Paranoid Ideation; Illogical  History of Schizophrenia/Schizoaffective disorder:Yes  Duration of Psychotic Symptoms:Greater than six months  Hallucinations:No data recorded Ideas of Reference:Paranoia  Suicidal Thoughts:No data recorded Homicidal Thoughts:No data recorded  Sensorium  Memory:Immediate  Good  Judgment:Impaired  Insight:Poor   Executive Functions  Concentration:Poor  Attention Span:Poor  Recall:Fair  Fund of Knowledge:Fair  Language:Fair   Psychomotor Activity  Psychomotor Activity:No data recorded  Assets  Assets:Physical Health   Sleep  Sleep:No data recorded   Physical Exam: Physical Exam Vitals and nursing note reviewed.  Constitutional:      Appearance: Normal appearance.  HENT:     Head: Normocephalic and atraumatic.     Mouth/Throat:     Pharynx: Oropharynx is clear.  Eyes:     Pupils: Pupils are equal, round, and reactive to light.  Cardiovascular:     Rate and Rhythm: Normal rate and regular rhythm.  Pulmonary:     Effort: Pulmonary effort is normal.     Breath sounds: Normal breath sounds.  Abdominal:     General: Abdomen is flat.  Palpations: Abdomen is soft.  Musculoskeletal:        General: Normal range of motion.  Skin:    General: Skin is warm and dry.  Neurological:     General: No focal deficit present.     Mental Status: He is alert. Mental status is at baseline.  Psychiatric:        Attention and Perception: He is inattentive.        Mood and Affect: Mood normal. Affect is blunt.        Speech: Speech is delayed.        Behavior: Behavior is slowed and withdrawn.        Thought Content: Thought content is paranoid and delusional.        Cognition and Memory: Cognition is impaired. Memory is impaired.        Judgment: Judgment is inappropriate.   Review of Systems  Constitutional: Negative.   HENT: Negative.    Eyes: Negative.   Respiratory: Negative.    Cardiovascular: Negative.   Gastrointestinal: Negative.   Musculoskeletal: Negative.   Skin: Negative.   Neurological: Negative.   Psychiatric/Behavioral:  Positive for hallucinations. Negative for depression, substance abuse and suicidal ideas. The patient is nervous/anxious.   Blood pressure 119/83, pulse 79, temperature 97.9 F (36.6 C), temperature  source Oral, resp. rate 18, height 5\' 10"  (1.778 m), weight 55.8 kg, SpO2 98 %. Body mass index is 17.65 kg/m.   Treatment Plan Summary: Plan continue current medicine.  Supportive counseling.  Educational counseling.  Really encourage him to stay compliant with medicine.  I am not sure how much more improvement we are going to get.  Treatment team is all discussing the appropriateness of the plan which at this point it is tentatively for discharge to the jail situation on Monday if we get the appropriate paperwork  Friday, MD 07/19/2021, 2:52 PM

## 2021-07-19 NOTE — Plan of Care (Signed)
  Problem: Coping: Goal: Will verbalize feelings Outcome: Not Progressing   Problem: Health Behavior/Discharge Planning: Goal: Compliance with prescribed medication regimen will improve Outcome: Not Progressing   Problem: Nutritional: Goal: Ability to achieve adequate nutritional intake will improve Outcome: Progressing   Problem: Role Relationship: Goal: Ability to communicate needs accurately will improve Outcome: Progressing Goal: Ability to interact with others will improve Outcome: Progressing

## 2021-07-19 NOTE — Group Note (Signed)
LCSW Group Therapy Note   Group Date: 07/19/2021 Start Time: 1300 End Time: 1400   Type of Therapy and Topic:  Group Therapy: Boundaries  Participation Level:  Did Not Attend  Description of Group: This group will address the use of boundaries in their personal lives. Patients will explore why boundaries are important, the difference between healthy and unhealthy boundaries, and negative and postive outcomes of different boundaries and will look at how boundaries can be crossed.  Patients will be encouraged to identify current boundaries in their own lives and identify what kind of boundary is being set. Facilitators will guide patients in utilizing problem-solving interventions to address and correct types boundaries being used and to address when no boundary is being used. Understanding and applying boundaries will be explored and addressed for obtaining and maintaining a balanced life. Patients will be encouraged to explore ways to assertively make their boundaries and needs known to significant others in their lives, using other group members and facilitator for role play, support, and feedback.  Therapeutic Goals:  1.  Patient will identify areas in their life where setting clear boundaries could be  used to improve their life.  2.  Patient will identify signs/triggers that a boundary is not being respected. 3.  Patient will identify two ways to set boundaries in order to achieve balance in  their lives: 4.  Patient will demonstrate ability to communicate their needs and set boundaries  through discussion and/or role plays  Summary of Patient Progress:   X  Therapeutic Modalities:   Cognitive Behavioral Therapy Solution-Focused Therapy  Harden Mo, LCSWA 07/19/2021  1:54 PM

## 2021-07-19 NOTE — BHH Counselor (Signed)
CSW followed up with the patient's guardian.  CSW updated that order to release patient back to [facility] jail has been received, pt has declined to eat stating it is against his religion and pt has declined his medications for the day.  Guardian was receptive.  Guardian states that these are familiar behaviors.   Penni Homans, MSW, LCSW 07/19/2021 4:05 PM

## 2021-07-19 NOTE — Plan of Care (Signed)
  Problem: Activity: Goal: Will verbalize the importance of balancing activity with adequate rest periods Outcome: Not Progressing   Problem: Education: Goal: Will be free of psychotic symptoms Outcome: Not Progressing Goal: Knowledge of the prescribed therapeutic regimen will improve Outcome: Not Progressing   Problem: Coping: Goal: Coping ability will improve Outcome: Not Progressing Goal: Will verbalize feelings Outcome: Not Progressing   Problem: Health Behavior/Discharge Planning: Goal: Compliance with prescribed medication regimen will improve Outcome: Not Progressing   Problem: Health Behavior/Discharge Planning: Goal: Compliance with prescribed medication regimen will improve Outcome: Not Progressing   Problem: Nutritional: Goal: Ability to achieve adequate nutritional intake will improve Outcome: Not Progressing   Problem: Role Relationship: Goal: Ability to communicate needs accurately will improve Outcome: Not Progressing Goal: Ability to interact with others will improve Outcome: Not Progressing   Problem: Safety: Goal: Ability to redirect hostility and anger into socially appropriate behaviors will improve Outcome: Not Progressing Goal: Ability to remain free from injury will improve Outcome: Not Progressing   Problem: Self-Care: Goal: Ability to participate in self-care as condition permits will improve Outcome: Not Progressing   Problem: Self-Concept: Goal: Will verbalize positive feelings about self Outcome: Not Progressing   Problem: Consults Goal: Concurrent Medical Patient Education Description: (See Patient Education Module for education specifics) Outcome: Not Progressing   Problem: [facility] Concurrent Medical Problem Goal: LTG-Pt will be physically stable and he/significant other Description: (Patient will be physically stable and he/significant other will be able to verbalize understanding of follow-up care and symptoms that would warrant  further treatment) Outcome: Not Progressing Goal: STG-Vital signs will be within defined limits or stabilized Description: (STG- Vital signs will be within defined limits or stabilized for individual) Outcome: Not Progressing Goal: STG-Compliance with medication and/or treatment as ordered Description: (STG-Compliance with medication and/or treatment as ordered by MD) Outcome: Not Progressing Goal: STG-Verbalize two symptoms that would warrant further Description: (STG-Verbalize two symptoms that would warrant further treatment) Outcome: Not Progressing Goal: STG-Patient will participate in management/stabilization Description: (STG-Patient will participate in management/stabilization of medical condition) Outcome: Not Progressing Goal: STG-Other (Specify): Description: STG-Other Concurrent Medical (Specify): Outcome: Not Progressing

## 2021-07-19 NOTE — Progress Notes (Signed)
Patient presents with flat affect. Denies any SI, HI, AVH. Visible in dayroom with peers, no interaction. Was pleasant, but refused to take any pm medications. Patient had restful uneventful night. Encouragement and support provided. Safety checks maintained. Medications given as prescribed. Pt remains safe on unit with q 15 min checks.

## 2021-07-20 MED ORDER — OLANZAPINE 10 MG IM SOLR
15.0000 mg | Freq: Two times a day (BID) | INTRAMUSCULAR | Status: DC | PRN
  Administered 2021-07-20: 15 mg via INTRAMUSCULAR
  Filled 2021-07-20: qty 20

## 2021-07-20 NOTE — Progress Notes (Signed)
Patient remains isolative to his room. He came to medication room and he was compliant with medications. He denies SI,HI, & AVH. He is currently in bed resting at this time.

## 2021-07-20 NOTE — Progress Notes (Signed)
[facility] MD Progress Note  07/20/2021 1:49 PM David Davila.  MRN:  [id number] Subjective: Follow-up for this patient with schizophrenia.  He continues to frequently refuse to eat.  Refused medication today and has been intermittently refusing oral medicine.  Not aggressive or violent to anyone.  Mostly paces back or forth.  Denies any suicidal thoughts but unable to really engage in lucid conversation. Principal Problem: Schizophrenia, paranoid type (HCC) Diagnosis: Principal Problem:   Schizophrenia, paranoid type (HCC) Active Problems:   Noncompliance with medication regimen   TBI (traumatic brain injury)  Total Time spent with patient: 30 minutes  Past Psychiatric History: Long history of schizophrenia chronic poor functioning  Past Medical History:  Past Medical History:  Diagnosis Date   Acute psychosis (HCC)    Antisocial personality disorder (HCC)    Cannabis abuse    Past   Paranoid schizophrenia (HCC)    Schizoaffective disorder, bipolar type (HCC)    History reviewed. No pertinent surgical history. Family History: History reviewed. No pertinent family history. Family Psychiatric  History: See previous Social History:  Social History   Substance and Sexual Activity  Alcohol Use No     Social History   Substance and Sexual Activity  Drug Use Not Currently    Social History   Socioeconomic History   Marital status: Single    Spouse name: Not on file   Number of children: Not on file   Years of education: Not on file   Highest education level: Not on file  Occupational History   Not on file  Tobacco Use   Smoking status: Every Day    Packs/day: 1.00    Types: Cigarettes   Smokeless tobacco: Never  Vaping Use   Vaping Use: Unknown  Substance and Sexual Activity   Alcohol use: No   Drug use: Not Currently   Sexual activity: Not Currently  Other Topics Concern   Not on file  Social History Narrative   Not on file   Social Determinants of  Health   Financial Resource Strain: Not on file  Food Insecurity: Not on file  Transportation Needs: Not on file  Physical Activity: Not on file  Stress: Not on file  Social Connections: Not on file   Additional Social History:                         Sleep: Fair  Appetite:  Poor  Current Medications: Current Facility-Administered Medications  Medication Dose Route Frequency Provider Last Rate Last Admin   acetaminophen (TYLENOL) tablet 650 mg  650 mg Oral Q6H PRN Charm Rings, NP       alum & mag hydroxide-simeth (MAALOX/MYLANTA) 200-200-20 MG/5ML suspension 30 mL  30 mL Oral Q4H PRN Charm Rings, NP       atorvastatin (LIPITOR) tablet 10 mg  10 mg Oral q1800 Deal, Adonis Housekeeper, RPH   10 mg at 07/19/21 2102   benztropine (COGENTIN) tablet 1 mg  1 mg Oral BID Charm Rings, NP   1 mg at 07/17/21 1800   divalproex (DEPAKOTE) DR tablet 1,000 mg  1,000 mg Oral BH-q7a Lord, Jamison Y, NP   1,000 mg at 07/17/21 1201   haloperidol (HALDOL) tablet 2 mg  2 mg Oral BID Daralyn Bert T, MD   2 mg at 07/17/21 1800   magnesium hydroxide (MILK OF MAGNESIA) suspension 30 mL  30 mL Oral Daily PRN Charm Rings, NP  mirtazapine (REMERON) tablet 15 mg  15 mg Oral QHS Charm Rings, NP   15 mg at 07/19/21 2102   OLANZapine (ZYPREXA) injection 15 mg  15 mg Intramuscular BID PRN Jadd Gasior, Jackquline Denmark, MD       OLANZapine (ZYPREXA) tablet 15 mg  15 mg Oral BID Eduar Kumpf T, MD   15 mg at 07/17/21 1800   traZODone (DESYREL) tablet 100 mg  100 mg Oral QHS PRN Charm Rings, NP   100 mg at 07/19/21 2102    Lab Results: No results found for this or any previous visit (from the past 48 hour(s)).  Blood Alcohol level:  Lab Results  Component Value Date   ETH <10 06/14/2021   ETH <10 04/13/2021    Metabolic Disorder Labs: Lab Results  Component Value Date   HGBA1C 5.3 06/29/2021   MPG 105.41 06/29/2021   MPG 108.28 03/25/2018   No results found for: PROLACTIN Lab Results   Component Value Date   CHOL 210 (H) 06/29/2021   TRIG 74 06/29/2021   HDL 60 06/29/2021   CHOLHDL 3.5 06/29/2021   VLDL 15 06/29/2021   LDLCALC 135 (H) 06/29/2021   LDLCALC 125 (H) 03/25/2018    Physical Findings: AIMS: Facial and Oral Movements Muscles of Facial Expression: None, normal Lips and Perioral Area: None, normal Jaw: None, normal Tongue: None, normal,Extremity Movements Upper (arms, wrists, hands, fingers): None, normal Lower (legs, knees, ankles, toes): None, normal, Trunk Movements Neck, shoulders, hips: None, normal, Overall Severity Severity of abnormal movements (highest score from questions above): None, normal Incapacitation due to abnormal movements: None, normal Patient's awareness of abnormal movements (rate only patient's report): No Awareness, Dental Status Current problems with teeth and/or dentures?: No Does patient usually wear dentures?: No  CIWA:    COWS:     Musculoskeletal: Strength & Muscle Tone: within normal limits Gait & Station: normal Patient leans: N/A  Psychiatric Specialty Exam:  Presentation  General Appearance: Casual; Neat  Eye Contact:Fleeting  Speech:Garbled  Speech Volume:Decreased  Handedness:Right   Mood and Affect  Mood:Anxious; Irritable  Affect:Constricted   Thought Process  Thought Processes:Disorganized; Irrevelant  Descriptions of Associations:Loose  Orientation:Partial  Thought Content:Paranoid Ideation; Illogical  History of Schizophrenia/Schizoaffective disorder:Yes  Duration of Psychotic Symptoms:Greater than six months  Hallucinations:No data recorded Ideas of Reference:Paranoia  Suicidal Thoughts:No data recorded Homicidal Thoughts:No data recorded  Sensorium  Memory:Immediate Good  Judgment:Impaired  Insight:Poor   Executive Functions  Concentration:Poor  Attention Span:Poor  Recall:Fair  Fund of Knowledge:Fair  Language:Fair   Psychomotor Activity  Psychomotor  Activity:No data recorded  Assets  Assets:Physical Health   Sleep  Sleep:No data recorded   Physical Exam: Physical Exam Vitals and nursing note reviewed.  Constitutional:      Appearance: Normal appearance.  HENT:     Head: Normocephalic and atraumatic.     Mouth/Throat:     Pharynx: Oropharynx is clear.  Eyes:     Pupils: Pupils are equal, round, and reactive to light.  Cardiovascular:     Rate and Rhythm: Normal rate and regular rhythm.  Pulmonary:     Effort: Pulmonary effort is normal.     Breath sounds: Normal breath sounds.  Abdominal:     General: Abdomen is flat.     Palpations: Abdomen is soft.  Musculoskeletal:        General: Normal range of motion.  Skin:    General: Skin is warm and dry.  Neurological:     General: No  focal deficit present.     Mental Status: He is alert. Mental status is at baseline.  Psychiatric:        Attention and Perception: He is inattentive.        Mood and Affect: Affect is blunt.        Speech: He is noncommunicative.        Behavior: Behavior is withdrawn.        Thought Content: Thought content is delusional.        Cognition and Memory: Cognition is impaired.        Judgment: Judgment is inappropriate.   Review of Systems  Constitutional: Negative.   HENT: Negative.    Eyes: Negative.   Respiratory: Negative.    Cardiovascular: Negative.   Gastrointestinal: Negative.   Musculoskeletal: Negative.   Skin: Negative.   Neurological: Negative.   Psychiatric/Behavioral:  Positive for hallucinations. Negative for depression and suicidal ideas. The patient is nervous/anxious.   Blood pressure 109/70, pulse 88, temperature 98.2 F (36.8 C), temperature source Oral, resp. rate 20, height 5\' 10"  (1.778 m), weight 55.8 kg, SpO2 97 %. Body mass index is 17.65 kg/m.   Treatment Plan Summary: Plan difficult patient.  We did get him on the Grady shot and now he has had both doses of that but in the past has required more  medication.  He is refusing the olanzapine now.  I have put in an order to give that IM forced if he refuses the oral.  We are hoping we can possibly discharge him next week since he will be going back to jail where they can monitor him for safety.  No indication for increasing medication but tried daily to convince him to take his medicine and engage him in conversation although his psychosis makes it difficult.  [place], MD 07/20/2021, 1:49 PM

## 2021-07-20 NOTE — Progress Notes (Signed)
Pt did not eat breakfast or lunch. Pt refused to take his medication. He denies SI/HI and AVH. Pt responding to internal stimuli, paranoid and delusional about taking medications. Affect sad to flat. He isolated frequently, but out of his room  at lunch time and pacing the hall. No interaction with peers and staff.  Pt will nod yes or no, he will not talk to me. No participation in groups. He was encouraged to complete his ADLs.

## 2021-07-20 NOTE — Progress Notes (Signed)
Recreation Therapy Notes  Date: 07/20/2021  Time: 10:15 am     Location:  Craft room    Behavioral response: N/A   Intervention Topic: Stress Management    Discussion/Intervention: Patient did not attend group.  Clinical Observations/Feedback:  Patient did not attend group.   Boden Stucky LRT/CTRS          Dmiyah Liscano 07/20/2021 11:14 AM 

## 2021-07-20 NOTE — Group Note (Signed)
[facility] LCSW Group Therapy Note   Group Date: 07/20/2021 Start Time: 1300 End Time: 1400  Type of Therapy and Topic:  Group Therapy:  Feelings around Relapse and Recovery  Participation Level:  Did Not Attend   Mood:  Description of Group:    Patients in this group will discuss emotions they experience before and after a relapse. They will process how experiencing these feelings, or avoidance of experiencing them, relates to having a relapse. Facilitator will guide patients to explore emotions they have related to recovery. Patients will be encouraged to process which emotions are more powerful. They will be guided to discuss the emotional reaction significant others in their lives may have to patients' relapse or recovery. Patients will be assisted in exploring ways to respond to the emotions of others without this contributing to a relapse.  Therapeutic Goals: Patient will identify two or more emotions that lead to relapse for them:  Patient will identify two emotions that result when they relapse:  Patient will identify two emotions related to recovery:  Patient will demonstrate ability to communicate their needs through discussion and/or role plays.   Summary of Patient Progress: Patient did not attend group despite encouraged participation.    Therapeutic Modalities:   Cognitive Behavioral Therapy Solution-Focused Therapy Assertiveness Training Relapse Prevention Therapy   Corky Crafts, [place]

## 2021-07-20 NOTE — Plan of Care (Signed)
See progress notes Problem: Activity: Goal: Will verbalize the importance of balancing activity with adequate rest periods Outcome: Not Progressing   Problem: Education: Goal: Will be free of psychotic symptoms Outcome: Not Progressing Goal: Knowledge of the prescribed therapeutic regimen will improve Outcome: Not Progressing   Problem: Coping: Goal: Coping ability will improve Outcome: Not Progressing Goal: Will verbalize feelings Outcome: Not Progressing   Problem: Health Behavior/Discharge Planning: Goal: Compliance with prescribed medication regimen will improve Outcome: Not Progressing   Problem: Nutritional: Goal: Ability to achieve adequate nutritional intake will improve Outcome: Not Progressing   Problem: Role Relationship: Goal: Ability to communicate needs accurately will improve Outcome: Not Progressing Goal: Ability to interact with others will improve Outcome: Not Progressing   Problem: Safety: Goal: Ability to redirect hostility and anger into socially appropriate behaviors will improve Outcome: Not Progressing Goal: Ability to remain free from injury will improve Outcome: Not Progressing   Problem: Self-Care: Goal: Ability to participate in self-care as condition permits will improve Outcome: Not Progressing   Problem: Self-Concept: Goal: Will verbalize positive feelings about self Outcome: Not Progressing   Problem: Consults Goal: Concurrent Medical Patient Education Description: (See Patient Education Module for education specifics) Outcome: Not Progressing   Problem: [facility] [place] Concurrent Medical Problem Goal: LTG-Pt will be physically stable and he/significant other Description: (Patient will be physically stable and he/significant other will be able to verbalize understanding of follow-up care and symptoms that would warrant further treatment) Outcome: Not Progressing Goal: STG-Vital signs will be within defined limits or stabilized Description:  (STG- Vital signs will be within defined limits or stabilized for individual) Outcome: Not Progressing Goal: STG-Compliance with medication and/or treatment as ordered Description: (STG-Compliance with medication and/or treatment as ordered by MD) Outcome: Not Progressing Goal: STG-Verbalize two symptoms that would warrant further Description: (STG-Verbalize two symptoms that would warrant further treatment) Outcome: Not Progressing Goal: STG-Patient will participate in management/stabilization Description: (STG-Patient will participate in management/stabilization of medical condition) Outcome: Not Progressing Goal: STG-Other (Specify): Description: STG-Other Concurrent Medical (Specify): Outcome: Not Progressing

## 2021-07-21 NOTE — Group Note (Signed)
LCSW Group Therapy Note  Group Date: 07/21/2021 Start Time: 1320 End Time: 1420   Type of Therapy and Topic:  Group Therapy - Healthy vs Unhealthy Coping Skills  Participation Level:  Did Not Attend   Description of Group The focus of this group was to determine what unhealthy coping techniques typically are used by group members and what healthy coping techniques would be helpful in coping with various problems. Patients were guided in becoming aware of the differences between healthy and unhealthy coping techniques. Patients were asked to identify 2-3 healthy coping skills they would like to learn to use more effectively.  Therapeutic Goals Patients learned that coping is what human beings do all day long to deal with various situations in their lives Patients defined and discussed healthy vs unhealthy coping techniques Patients identified their preferred coping techniques and identified whether these were healthy or unhealthy Patients determined 2-3 healthy coping skills they would like to become more familiar with and use more often. Patients provided support and ideas to each other   Summary of Patient Progress: Patient did not attend group despite encouraged participation.    Therapeutic Modalities Cognitive Behavioral Therapy Motivational Interviewing  Marletta Lor 07/21/2021  4:37 PM

## 2021-07-21 NOTE — Progress Notes (Signed)
Patient alert and oriented x 4, affect is blunted thoughts are disorganized, appears responding to internal stimuli, guarded and isolates to self. Patient was offered emotional support, given medication as prescribed no distress noted, will continue to monitor.

## 2021-07-21 NOTE — Progress Notes (Signed)
[facility] MD Progress Note  07/21/2021 12:07 PM David Davila.  MRN:  [id number] Subjective: David Davila is pleasant and cooperative and has no complaints today.  He is taking his medications as prescribed and denies any side effects.  Principal Problem: Schizophrenia, paranoid type (HCC) Diagnosis: Principal Problem:   Schizophrenia, paranoid type (HCC) Active Problems:   Noncompliance with medication regimen   TBI (traumatic brain injury)  Total Time spent with patient: 15 minutes  Past Psychiatric History: Unremarkable  Past Medical History:  Past Medical History:  Diagnosis Date   Acute psychosis (HCC)    Antisocial personality disorder (HCC)    Cannabis abuse    Past   Paranoid schizophrenia (HCC)    Schizoaffective disorder, bipolar type (HCC)    History reviewed. No pertinent surgical history. Family History: History reviewed. No pertinent family history. Family Psychiatric  History: Unremarkable Social History:  Social History   Substance and Sexual Activity  Alcohol Use No     Social History   Substance and Sexual Activity  Drug Use Not Currently    Social History   Socioeconomic History   Marital status: Single    Spouse name: Not on file   Number of children: Not on file   Years of education: Not on file   Highest education level: Not on file  Occupational History   Not on file  Tobacco Use   Smoking status: Every Day    Packs/day: 1.00    Types: Cigarettes   Smokeless tobacco: Never  Vaping Use   Vaping Use: Unknown  Substance and Sexual Activity   Alcohol use: No   Drug use: Not Currently   Sexual activity: Not Currently  Other Topics Concern   Not on file  Social History Narrative   Not on file   Social Determinants of Health   Financial Resource Strain: Not on file  Food Insecurity: Not on file  Transportation Needs: Not on file  Physical Activity: Not on file  Stress: Not on file  Social Connections: Not on file   Additional  Social History:                         Sleep: Good  Appetite:  Good  Current Medications: Current Facility-Administered Medications  Medication Dose Route Frequency Provider Last Rate Last Admin   acetaminophen (TYLENOL) tablet 650 mg  650 mg Oral Q6H PRN Charm Rings, NP       alum & mag hydroxide-simeth (MAALOX/MYLANTA) 200-200-20 MG/5ML suspension 30 mL  30 mL Oral Q4H PRN Charm Rings, NP       atorvastatin (LIPITOR) tablet 10 mg  10 mg Oral q1800 Deal, Adonis Housekeeper, RPH   10 mg at 07/20/21 2100   benztropine (COGENTIN) tablet 1 mg  1 mg Oral BID Charm Rings, NP   1 mg at 07/21/21 2620   divalproex (DEPAKOTE) DR tablet 1,000 mg  1,000 mg Oral BH-q7a Lord, Jamison Y, NP   1,000 mg at 07/21/21 0730   haloperidol (HALDOL) tablet 2 mg  2 mg Oral BID Clapacs, John T, MD   2 mg at 07/21/21 3559   magnesium hydroxide (MILK OF MAGNESIA) suspension 30 mL  30 mL Oral Daily PRN Charm Rings, NP       mirtazapine (REMERON) tablet 15 mg  15 mg Oral QHS Charm Rings, NP   15 mg at 07/20/21 2154   OLANZapine (ZYPREXA) injection 15 mg  15 mg  Intramuscular BID PRN Clapacs, Jackquline Denmark, MD   15 mg at 07/20/21 1727   OLANZapine (ZYPREXA) tablet 15 mg  15 mg Oral BID Clapacs, Jackquline Denmark, MD   15 mg at 07/21/21 3009   traZODone (DESYREL) tablet 100 mg  100 mg Oral QHS PRN Charm Rings, NP   100 mg at 07/20/21 2154    Lab Results: No results found for this or any previous visit (from the past 48 hour(s)).  Blood Alcohol level:  Lab Results  Component Value Date   ETH <10 06/14/2021   ETH <10 04/13/2021    Metabolic Disorder Labs: Lab Results  Component Value Date   HGBA1C 5.3 06/29/2021   MPG 105.41 06/29/2021   MPG 108.28 03/25/2018   No results found for: PROLACTIN Lab Results  Component Value Date   CHOL 210 (H) 06/29/2021   TRIG 74 06/29/2021   HDL 60 06/29/2021   CHOLHDL 3.5 06/29/2021   VLDL 15 06/29/2021   LDLCALC 135 (H) 06/29/2021   LDLCALC 125 (H) 03/25/2018     Physical Findings: AIMS: Facial and Oral Movements Muscles of Facial Expression: None, normal Lips and Perioral Area: None, normal Jaw: None, normal Tongue: None, normal,Extremity Movements Upper (arms, wrists, hands, fingers): None, normal Lower (legs, knees, ankles, toes): None, normal, Trunk Movements Neck, shoulders, hips: None, normal, Overall Severity Severity of abnormal movements (highest score from questions above): None, normal Incapacitation due to abnormal movements: None, normal Patient's awareness of abnormal movements (rate only patient's report): No Awareness, Dental Status Current problems with teeth and/or dentures?: No Does patient usually wear dentures?: No  CIWA:    COWS:     Musculoskeletal: Strength & Muscle Tone: within normal limits Gait & Station: normal Patient leans: N/A  Psychiatric Specialty Exam:  Presentation  General Appearance: Casual; Neat  Eye Contact:Fleeting  Speech:Garbled  Speech Volume:Decreased  Handedness:Right   Mood and Affect  Mood:Anxious; Irritable  Affect:Constricted   Thought Process  Thought Processes:Disorganized; Irrevelant  Descriptions of Associations:Loose  Orientation:Partial  Thought Content:Paranoid Ideation; Illogical  History of Schizophrenia/Schizoaffective disorder:Yes  Duration of Psychotic Symptoms:Greater than six months  Hallucinations:No data recorded Ideas of Reference:Paranoia  Suicidal Thoughts:No data recorded Homicidal Thoughts:No data recorded  Sensorium  Memory:Immediate Good  Judgment:Impaired  Insight:Poor   Executive Functions  Concentration:Poor  Attention Span:Poor  Recall:Fair  Fund of Knowledge:Fair  Language:Fair   Psychomotor Activity  Psychomotor Activity:No data recorded  Assets  Assets:Physical Health   Sleep  Sleep:No data recorded   Physical Exam: Physical Exam Vitals and nursing note reviewed.  Constitutional:      Appearance:  Normal appearance. He is normal weight.  Neurological:     General: No focal deficit present.     Mental Status: He is alert and oriented to person, place, and time.  Psychiatric:        Attention and Perception: Attention and perception normal.        Mood and Affect: Mood is anxious.        Speech: He is noncommunicative.        Behavior: Behavior normal. Behavior is cooperative.        Thought Content: Thought content normal.        Cognition and Memory: Cognition and memory normal.        Judgment: Judgment normal.   Review of Systems  Constitutional: Negative.   HENT: Negative.    Eyes: Negative.   Respiratory: Negative.    Cardiovascular: Negative.   Gastrointestinal: Negative.  Genitourinary: Negative.   Musculoskeletal: Negative.   Skin: Negative.   Neurological: Negative.   Endo/Heme/Allergies: Negative.   Psychiatric/Behavioral:  Positive for depression.   Blood pressure 118/69, pulse 89, temperature 97.7 F (36.5 C), temperature source Oral, resp. rate 18, height 5\' 10"  (1.778 m), weight 55.8 kg, SpO2 100 %. Body mass index is 17.65 kg/m.   Treatment Plan Summary: Daily contact with patient to assess and evaluate symptoms and progress in treatment, Medication management, and Plan continue current medications  , DO 07/21/2021, 12:07 PM

## 2021-07-21 NOTE — Progress Notes (Signed)
Patient is A & O x4. He was medication compliant.  He had little interaction on the unit.  He did not attend group. He remained in his room.  15 minute safety check maintained while on the unit.

## 2021-07-21 NOTE — BH IP Treatment Plan (Signed)
Interdisciplinary Treatment and Diagnostic Plan Update  07/21/2021 Time of Session: 9:15AM David Davila. MRN: [id number]  Principal Diagnosis: Schizophrenia, paranoid type (HCC)  Secondary Diagnoses: Principal Problem:   Schizophrenia, paranoid type (HCC) Active Problems:   Noncompliance with medication regimen   TBI (traumatic brain injury)   Current Medications:  Current Facility-Administered Medications  Medication Dose Route Frequency Provider Last Rate Last Admin   acetaminophen (TYLENOL) tablet 650 mg  650 mg Oral Q6H PRN Charm Rings, NP       alum & mag hydroxide-simeth (MAALOX/MYLANTA) 200-200-20 MG/5ML suspension 30 mL  30 mL Oral Q4H PRN Charm Rings, NP       atorvastatin (LIPITOR) tablet 10 mg  10 mg Oral q1800 Deal, Adonis Housekeeper, RPH   10 mg at 07/20/21 2100   benztropine (COGENTIN) tablet 1 mg  1 mg Oral BID Charm Rings, NP   1 mg at 07/21/21 9476   divalproex (DEPAKOTE) DR tablet 1,000 mg  1,000 mg Oral BH-q7a Lord, Herminio Heads, NP   1,000 mg at 07/21/21 0730   haloperidol (HALDOL) tablet 2 mg  2 mg Oral BID Clapacs, Jackquline Denmark, MD   2 mg at 07/21/21 5465   magnesium hydroxide (MILK OF MAGNESIA) suspension 30 mL  30 mL Oral Daily PRN Charm Rings, NP       mirtazapine (REMERON) tablet 15 mg  15 mg Oral QHS Charm Rings, NP   15 mg at 07/20/21 2154   OLANZapine (ZYPREXA) injection 15 mg  15 mg Intramuscular BID PRN Clapacs, Jackquline Denmark, MD   15 mg at 07/20/21 1727   OLANZapine (ZYPREXA) tablet 15 mg  15 mg Oral BID Clapacs, Jackquline Denmark, MD   15 mg at 07/21/21 0354   traZODone (DESYREL) tablet 100 mg  100 mg Oral QHS PRN Charm Rings, NP   100 mg at 07/20/21 2154   PTA Medications: Medications Prior to Admission  Medication Sig Dispense Refill Last Dose   atorvastatin (LIPITOR) 10 MG tablet Take 1 tablet (10 mg total) by mouth daily at 6 PM. 30 tablet 0    benztropine (COGENTIN) 1 MG tablet Take 1 tablet (1 mg total) by mouth 2 (two) times daily. 60 tablet  0    divalproex (DEPAKOTE) 500 MG DR tablet Take 2 tablets (1,000 mg total) by mouth every morning. 60 tablet 0    haloperidol (HALDOL) 5 MG tablet Take 1 tablet (5 mg total) by mouth at bedtime. 30 tablet 0    mirtazapine (REMERON) 15 MG tablet Take 1 tablet (15 mg total) by mouth at bedtime. 30 tablet 0    OLANZapine (ZYPREXA) 10 MG tablet Take 1 tablet (10 mg total) by mouth 2 (two) times daily. 60 tablet 0    paliperidone (INVEGA SUSTENNA) 156 MG/ML SUSY injection Inject 1 mL (156 mg total) into the muscle once for 1 dose. (Patient not taking: Reported on 02/06/2021) 1 mL 0    paliperidone (INVEGA SUSTENNA) 234 MG/1.5ML SUSY injection Inject 234 mg into the muscle once for 1 dose. Next injection due 05/27/18 (Patient not taking: Reported on 02/06/2021) 1.5 mL 0    paliperidone (INVEGA) 6 MG 24 hr tablet Take 1 tablet (6 mg total) by mouth daily. (Patient not taking: No sig reported) 30 tablet 0    traZODone (DESYREL) 100 MG tablet Take 1 tablet (100 mg total) by mouth at bedtime as needed for sleep. (Patient not taking: Reported on 06/14/2021) 30 tablet 0  Patient Stressors: Financial difficulties   Medication change or noncompliance    Patient Strengths: Motivation for treatment/growth   Treatment Modalities: Medication Management, Group therapy, Case management,  1 to 1 session with clinician, Psychoeducation, Recreational therapy.   Physician Treatment Plan for Primary Diagnosis: Schizophrenia, paranoid type (HCC) Long Term Goal(s): Improvement in symptoms so as ready for discharge   Short Term Goals: Ability to demonstrate self-control will improve Ability to maintain clinical measurements within normal limits will improve Compliance with prescribed medications will improve Ability to disclose and discuss suicidal ideas Ability to identify and develop effective coping behaviors will improve  Medication Management: Evaluate patient's response, side effects, and tolerance of  medication regimen.  Therapeutic Interventions: 1 to 1 sessions, Unit Group sessions and Medication administration.  Evaluation of Outcomes: Progressing  Physician Treatment Plan for Secondary Diagnosis: Principal Problem:   Schizophrenia, paranoid type (HCC) Active Problems:   Noncompliance with medication regimen   TBI (traumatic brain injury)  Long Term Goal(s): Improvement in symptoms so as ready for discharge   Short Term Goals: Ability to demonstrate self-control will improve Ability to maintain clinical measurements within normal limits will improve Compliance with prescribed medications will improve Ability to disclose and discuss suicidal ideas Ability to identify and develop effective coping behaviors will improve     Medication Management: Evaluate patient's response, side effects, and tolerance of medication regimen.  Therapeutic Interventions: 1 to 1 sessions, Unit Group sessions and Medication administration.  Evaluation of Outcomes: Not Progressing   RN Treatment Plan for Primary Diagnosis: Schizophrenia, paranoid type (HCC) Long Term Goal(s): Knowledge of disease and therapeutic regimen to maintain health will improve  Short Term Goals: Ability to remain free from injury will improve, Ability to verbalize frustration and anger appropriately will improve, Ability to demonstrate self-control, Ability to participate in decision making will improve, Ability to verbalize feelings will improve, Ability to disclose and discuss suicidal ideas, Ability to identify and develop effective coping behaviors will improve, and Compliance with prescribed medications will improve  Medication Management: RN will administer medications as ordered by provider, will assess and evaluate patient's response and provide education to patient for prescribed medication. RN will report any adverse and/or side effects to prescribing provider.  Therapeutic Interventions: 1 on 1 counseling sessions,  Psychoeducation, Medication administration, Evaluate responses to treatment, Monitor vital signs and CBGs as ordered, Perform/monitor CIWA, COWS, AIMS and Fall Risk screenings as ordered, Perform wound care treatments as ordered.  Evaluation of Outcomes: Not Progressing   LCSW Treatment Plan for Primary Diagnosis: Schizophrenia, paranoid type (HCC) Long Term Goal(s): Safe transition to appropriate next level of care at discharge, Engage patient in therapeutic group addressing interpersonal concerns.  Short Term Goals: Engage patient in aftercare planning with referrals and resources, Increase social support, Increase ability to appropriately verbalize feelings, Increase emotional regulation, Facilitate acceptance of mental health diagnosis and concerns, and Increase skills for wellness and recovery  Therapeutic Interventions: Assess for all discharge needs, 1 to 1 time with Social worker, Explore available resources and support systems, Assess for adequacy in community support network, Educate family and significant other(s) on suicide prevention, Complete Psychosocial Assessment, Interpersonal group therapy.  Evaluation of Outcomes: Not Progressing   Progress in Treatment: Attending groups: No. Participating in groups: No. Taking medication as prescribed: No. Toleration medication: Yes. Family/Significant other contact made: Yes, individual(s) contacted:  patient's guardian Patient understands diagnosis: No. Discussing patient identified problems/goals with staff: Yes. Medical problems stabilized or resolved: Yes. Denies suicidal/homicidal ideation: Yes. Issues/concerns  per patient self-inventory: No. Other: none.  New problem(s) identified: Yes, Describe:  patient refuses to eat. Patient has a forced medications order due to ongoing noncompliance with medication regimen.  New Short Term/Long Term Goal(s): elimination of symptoms of psychosis, medication management for mood  stabilization; elimination of SI thoughts; development of comprehensive mental wellness/sobriety plan. Update 06/30/2021: No changes at this time. Update 07/06/21: No changes at this time. Update 07/10/21: No changes at this time. Update 07/15/2021: No changes at this time. Update 07/21/2021: No changes at this time.   Patient Goals: "I don't know what they brought me to the hospital for." Update 06/30/2021: No changes at this time. Update 07/06/21: No changes at this time. Update 07/10/21: No changes at this time. Update 07/15/2021: No changes at this time. Update 07/21/2021: No changes at this time.   Discharge Plan or Barriers: CSW will assist pt/guardian with development of an appropriate aftercare/discharge plan.06/30/2021: No changes at this time. Update 07/06/21: No changes at this time. Update 07/10/21: No changes at this time. Update 07/15/2021: No changes at this time. Update 07/21/2021: No changes at this time.   Reason for Continuation of Hospitalization: Delusions  Medication stabilization Other; describe refusal to eat.  Estimated Length of Stay: TBD   Scribe for Treatment Team: Marletta Lor 07/21/2021 9:08 AM

## 2021-07-22 NOTE — Progress Notes (Signed)
Patient isolative to his room. Compliant with medications, denies SI/HI/A/VH and verbally contracted for safety. Q 15 minutes safety checks ongoing. Patient remains safe. Support and encouragement provided as needed.

## 2021-07-22 NOTE — Progress Notes (Signed)
[facility] MD Progress Note  07/22/2021 3:27 PM [name]  MRN:  [id number] Subjective: David Davila.  The last couple days he has been in bed when I have seen him.  He states he is doing fine and he is trying to sleep.  He denies any depression or suicidal ideation.  He is taking his medications as prescribed and denies any side effects. Principal Problem: Schizophrenia, paranoid type (HCC) Diagnosis: Principal Problem:   Schizophrenia, paranoid type (HCC) Active Problems:   Noncompliance with medication regimen   TBI (traumatic brain injury)  Total Time spent with patient: 15 minutes  Past Psychiatric History: See H&P  Past Medical History:  Past Medical History:  Diagnosis Date   Acute psychosis (HCC)    Antisocial personality disorder (HCC)    Cannabis abuse    Past   Paranoid schizophrenia (HCC)    Schizoaffective disorder, bipolar type (HCC)    History reviewed. No pertinent surgical history. Family History: History reviewed. No pertinent family history. Family Psychiatric  History: See H&P Social History:  Social History   Substance and Sexual Activity  Alcohol Use No     Social History   Substance and Sexual Activity  Drug Use Not Currently    Social History   Socioeconomic History   Marital status: Single    Spouse name: Not on file   Number of children: Not on file   Years of education: Not on file   Highest education level: Not on file  Occupational History   Not on file  Tobacco Use   Smoking status: Every Day    Packs/day: 1.00    Types: Cigarettes   Smokeless tobacco: Never  Vaping Use   Vaping Use: Unknown  Substance and Sexual Activity   Alcohol use: No   Drug use: Not Currently   Sexual activity: Not Currently  Other Topics Concern   Not on file  Social History Narrative   Not on file   Social Determinants of Health   Financial Resource Strain: Not on file  Food Insecurity: Not on file  Transportation  Needs: Not on file  Physical Activity: Not on file  Stress: Not on file  Social Connections: Not on file   Additional Social History:                         Sleep: Good  Appetite:  Good  Current Medications: Current Facility-Administered Medications  Medication Dose Route Frequency Provider Last Rate Last Admin   acetaminophen (TYLENOL) tablet 650 mg  650 mg Oral Q6H PRN Charm Rings, NP       alum & mag hydroxide-simeth (MAALOX/MYLANTA) 200-200-20 MG/5ML suspension 30 mL  30 mL Oral Q4H PRN Charm Rings, NP       atorvastatin (LIPITOR) tablet 10 mg  10 mg Oral q1800 Deal, Adonis Housekeeper, RPH   10 mg at 07/21/21 2100   benztropine (COGENTIN) tablet 1 mg  1 mg Oral BID Charm Rings, NP   1 mg at 07/22/21 0845   divalproex (DEPAKOTE) DR tablet 1,000 mg  1,000 mg Oral Earl Lites, Jamison Y, NP   1,000 mg at 07/22/21 0845   haloperidol (HALDOL) tablet 2 mg  2 mg Oral BID Clapacs, Jackquline Denmark, MD   2 mg at 07/22/21 0845   magnesium hydroxide (MILK OF MAGNESIA) suspension 30 mL  30 mL Oral Daily PRN Charm Rings, NP  mirtazapine (REMERON) tablet 15 mg  15 mg Oral QHS Charm Rings, NP   15 mg at 07/21/21 2137   OLANZapine (ZYPREXA) injection 15 mg  15 mg Intramuscular BID PRN Clapacs, Jackquline Denmark, MD   15 mg at 07/20/21 1727   OLANZapine (ZYPREXA) tablet 15 mg  15 mg Oral BID Clapacs, Jackquline Denmark, MD   15 mg at 07/22/21 0845   traZODone (DESYREL) tablet 100 mg  100 mg Oral QHS PRN Charm Rings, NP   100 mg at 07/20/21 2154    Lab Results: No results found for this or any previous visit (from the past 48 hour(s)).  Blood Alcohol level:  Lab Results  Component Value Date   ETH <10 06/14/2021   ETH <10 04/13/2021    Metabolic Disorder Labs: Lab Results  Component Value Date   HGBA1C 5.3 06/29/2021   MPG 105.41 06/29/2021   MPG 108.28 03/25/2018   No results found for: PROLACTIN Lab Results  Component Value Date   CHOL 210 (H) 06/29/2021   TRIG 74 06/29/2021   HDL  60 06/29/2021   CHOLHDL 3.5 06/29/2021   VLDL 15 06/29/2021   LDLCALC 135 (H) 06/29/2021   LDLCALC 125 (H) 03/25/2018    Physical Findings: AIMS: Facial and Oral Movements Muscles of Facial Expression: None, normal Lips and Perioral Area: None, normal Jaw: None, normal Tongue: None, normal,Extremity Movements Upper (arms, wrists, hands, fingers): None, normal Lower (legs, knees, ankles, toes): None, normal, Trunk Movements Neck, shoulders, hips: None, normal, Overall Severity Severity of abnormal movements (highest score from questions above): None, normal Incapacitation due to abnormal movements: None, normal Patient's awareness of abnormal movements (rate only patient's report): No Awareness, Dental Status Current problems with teeth and/or dentures?: No Does patient usually wear dentures?: No  CIWA:    COWS:     Musculoskeletal: Strength & Muscle Tone: within normal limits Gait & Station: normal Patient leans: N/A  Psychiatric Specialty Exam:  Presentation  General Appearance: Casual; Neat  Eye Contact:Fleeting  Speech:Garbled  Speech Volume:Decreased  Handedness:Right   Mood and Affect  Mood:Anxious; Irritable  Affect:Constricted   Thought Process  Thought Processes:Disorganized; Irrevelant  Descriptions of Associations:Loose  Orientation:Partial  Thought Content:Paranoid Ideation; Illogical  History of Schizophrenia/Schizoaffective disorder:Yes  Duration of Psychotic Symptoms:Greater than six months  Hallucinations:No data recorded Ideas of Reference:Paranoia  Suicidal Thoughts:No data recorded Homicidal Thoughts:No data recorded  Sensorium  Memory:Immediate Good  Judgment:Impaired  Insight:Poor   Executive Functions  Concentration:Poor  Attention Span:Poor  Recall:Fair  Fund of Knowledge:Fair  Language:Fair   Psychomotor Activity  Psychomotor Activity:No data recorded  Assets  Assets:Physical Health   Sleep   Sleep:No data recorded   Physical Exam: Physical Exam Vitals and nursing note reviewed.  Constitutional:      Appearance: Normal appearance. He is normal weight.  Neurological:     General: No focal deficit present.     Mental Status: He is alert and oriented to person, place, and time.  Psychiatric:        Attention and Perception: Attention and perception normal.        Mood and Affect: Mood normal.        Speech: Speech normal.        Behavior: Behavior normal.        Thought Content: Thought content normal.        Cognition and Memory: Cognition and memory normal.        Judgment: Judgment normal.   Review of Systems  Constitutional: Negative.   HENT: Negative.    Eyes: Negative.   Respiratory: Negative.    Cardiovascular: Negative.   Gastrointestinal: Negative.   Genitourinary: Negative.   Musculoskeletal: Negative.   Skin: Negative.   Neurological: Negative.   Endo/Heme/Allergies: Negative.   Psychiatric/Behavioral: Negative.    Blood pressure 118/69, pulse 89, temperature 97.7 F (36.5 C), temperature source Oral, resp. rate 18, height 5\' 10"  (1.778 m), weight 55.8 kg, SpO2 100 %. Body mass index is 17.65 kg/m.   Treatment Plan Summary: Daily contact with patient to assess and evaluate symptoms and progress in treatment, Medication management, and Plan continue current medications.  , DO 07/22/2021, 3:27 PM

## 2021-07-22 NOTE — Group Note (Signed)
LCSW Group Therapy Note  Group Date: 07/22/2021 Start Time: 1315 End Time: 1400   Type of Therapy and Topic:  Group Therapy - How To Cope with Nervousness about Discharge   Participation Level:  Did Not Attend   Description of Group This process group involved identification of patients' feelings about discharge. Some of them are scheduled to be discharged soon, while others are new admissions, but each of them was asked to share thoughts and feelings surrounding discharge from the hospital. One common theme was that they are excited at the prospect of going home, while another was that many of them are apprehensive about sharing why they were hospitalized. Patients were given the opportunity to discuss these feelings with their peers in preparation for discharge.  Therapeutic Goals  Patient will identify their overall feelings about pending discharge. Patient will think about how they might proactively address issues that they believe will once again arise once they get home (i.e. with parents). Patients will participate in discussion about having hope for change.   Summary of Patient Progress:  Patient did not attend group despite encouraged participation.   Therapeutic Modalities Cognitive Behavioral Therapy   Marletta Lor 07/22/2021  4:54 PM

## 2021-07-23 NOTE — Group Note (Signed)
[facility] LCSW Group Therapy Note    Group Date: 07/23/2021 Start Time: 1300 End Time: 1400  Type of Therapy and Topic:  Group Therapy:  Overcoming Obstacles  Participation Level:  [facility] PARTICIPATION LEVEL: Did Not Attend  Mood:  Description of Group:   In this group patients will be encouraged to explore what they see as obstacles to their own wellness and recovery. They will be guided to discuss their thoughts, feelings, and behaviors related to these obstacles. The group will process together ways to cope with barriers, with attention given to specific choices patients can make. Each patient will be challenged to identify changes they are motivated to make in order to overcome their obstacles. This group will be process-oriented, with patients participating in exploration of their own experiences as well as giving and receiving support and challenge from other group members.  Therapeutic Goals: 1. Patient will identify personal and current obstacles as they relate to admission. 2. Patient will identify barriers that currently interfere with their wellness or overcoming obstacles.  3. Patient will identify feelings, thought process and behaviors related to these barriers. 4. Patient will identify two changes they are willing to make to overcome these obstacles:    Summary of Patient Progress   X   Therapeutic Modalities:   Cognitive Behavioral Therapy Solution Focused Therapy Motivational Interviewing Relapse Prevention Therapy   Harden Mo, LCSW

## 2021-07-23 NOTE — Plan of Care (Signed)
  Problem: Activity: Goal: Will verbalize the importance of balancing activity with adequate rest periods Outcome: Progressing   Problem: Coping: Goal: Coping ability will improve Outcome: Progressing Goal: Will verbalize feelings Outcome: Progressing   Problem: Health Behavior/Discharge Planning: Goal: Compliance with prescribed medication regimen will improve Outcome: Progressing

## 2021-07-23 NOTE — Progress Notes (Signed)
   07/23/21 1100  Psych Admission Type (Psych Patients Only)  Admission Status Involuntary  Psychosocial Assessment  Patient Complaints None  Eye Contact Brief  Facial Expression Flat  Affect Flat  Speech Logical/coherent  Interaction Guarded  Motor Activity Pacing;Restless  Appearance/Hygiene Improved;In scrubs  Behavior Characteristics Calm  Mood Preoccupied  Aggressive Behavior  Effect No apparent injury  Thought Process  Coherency Concrete thinking  Content WDL  Delusions None reported or observed  Perception WDL  Hallucination None reported or observed  Judgment Impaired  Confusion None  Danger to Self  Current suicidal ideation? Denies  Danger to Others  Danger to Others None reported or observed  Patient continues to require encouragement to take his PO medications. Denies SI/HI/A/VH. Interacting well with Games developer. No adverse drug noted. Q 15 minutes safety checks ongoing without self harm gestures.

## 2021-07-23 NOTE — Progress Notes (Signed)
Recreation Therapy Notes   Date: 07/23/2021  Time: 10:30 am      Location: Craft room    Behavioral response: N/A   Intervention Topic: Time Management    Discussion/Intervention: Patient did not attend group.   Clinical Observations/Feedback:  Patient did not attend group.    Henli Hey LRT/CTRS        Erikah Thumm 07/23/2021 12:04 PM

## 2021-07-23 NOTE — Progress Notes (Signed)
Patient refused breakfast and lunch managed to eat dinner with encouragement. Patient stated that " They say I have delusions for the past 20 years I don't like to eat because faeces smell and if you keep eating you will die from that smell of dead food in your body it becomes poison. I don't like to eat dead animals or dead plants. I only eat because you all force me to eat and take medications" Patient has been compliant with medications this shift continue to be isolative. Support and encouragement provided as needed.

## 2021-07-23 NOTE — Progress Notes (Signed)
[facility] MD Progress Note  07/23/2021 2:51 PM David Davila.  MRN:  [id number] Subjective: Follow-up with this unfortunate man with schizophrenia and a history of head injury.  Patient says he has not had anything to eat today although he ate a little bit over the weekend.  Continues to say that he does not believe in eating and is unhappy that people are forcing him to do it.  Also talked with me a bit about how unhappy he is to have to take medicine.  Patient tacitly admitted that he is better off now than when he first came to the hospital but will not acknowledge that medicine has anything to do with it.  He is not actively trying to kill himself.  In fact he continues to claim to me that he believes that only by not eating will he be able to live and that eating would cause him to die. Principal Problem: Schizophrenia, paranoid type (HCC) Diagnosis: Principal Problem:   Schizophrenia, paranoid type (HCC) Active Problems:   Noncompliance with medication regimen   TBI (traumatic brain injury)  Total Time spent with patient: 30 minutes  Past Psychiatric History: Past history of longstanding chronic psychosis and noncompliance behavior problems  Past Medical History:  Past Medical History:  Diagnosis Date   Acute psychosis (HCC)    Antisocial personality disorder (HCC)    Cannabis abuse    Past   Paranoid schizophrenia (HCC)    Schizoaffective disorder, bipolar type (HCC)    History reviewed. No pertinent surgical history. Family History: History reviewed. No pertinent family history. Family Psychiatric  History: See previous Social History:  Social History   Substance and Sexual Activity  Alcohol Use No     Social History   Substance and Sexual Activity  Drug Use Not Currently    Social History   Socioeconomic History   Marital status: Single    Spouse name: Not on file   Number of children: Not on file   Years of education: Not on file   Highest education  level: Not on file  Occupational History   Not on file  Tobacco Use   Smoking status: Every Day    Packs/day: 1.00    Types: Cigarettes   Smokeless tobacco: Never  Vaping Use   Vaping Use: Unknown  Substance and Sexual Activity   Alcohol use: No   Drug use: Not Currently   Sexual activity: Not Currently  Other Topics Concern   Not on file  Social History Narrative   Not on file   Social Determinants of Health   Financial Resource Strain: Not on file  Food Insecurity: Not on file  Transportation Needs: Not on file  Physical Activity: Not on file  Stress: Not on file  Social Connections: Not on file   Additional Social History:                         Sleep: Fair  Appetite:  Poor  Current Medications: Current Facility-Administered Medications  Medication Dose Route Frequency Provider Last Rate Last Admin   acetaminophen (TYLENOL) tablet 650 mg  650 mg Oral Q6H PRN Charm Rings, NP       alum & mag hydroxide-simeth (MAALOX/MYLANTA) 200-200-20 MG/5ML suspension 30 mL  30 mL Oral Q4H PRN Charm Rings, NP       atorvastatin (LIPITOR) tablet 10 mg  10 mg Oral q1800 Deal, Adonis Housekeeper, RPH   10 mg at 07/21/21  2100   benztropine (COGENTIN) tablet 1 mg  1 mg Oral BID Charm Rings, NP   1 mg at 07/23/21 0932   divalproex (DEPAKOTE) DR tablet 1,000 mg  1,000 mg Oral Earl Lites, Herminio Heads, NP   1,000 mg at 07/23/21 0932   haloperidol (HALDOL) tablet 2 mg  2 mg Oral BID Shatasia Cutshaw T, MD   2 mg at 07/23/21 0932   magnesium hydroxide (MILK OF MAGNESIA) suspension 30 mL  30 mL Oral Daily PRN Charm Rings, NP       mirtazapine (REMERON) tablet 15 mg  15 mg Oral QHS Charm Rings, NP   15 mg at 07/22/21 2058   OLANZapine (ZYPREXA) injection 15 mg  15 mg Intramuscular BID PRN Imelda Dandridge, Jackquline Denmark, MD   15 mg at 07/20/21 1727   OLANZapine (ZYPREXA) tablet 15 mg  15 mg Oral BID Bentlee Benningfield, Jackquline Denmark, MD   15 mg at 07/23/21 0932   traZODone (DESYREL) tablet 100 mg  100 mg Oral  QHS PRN Charm Rings, NP   100 mg at 07/20/21 2154    Lab Results: No results found for this or any previous visit (from the past 48 hour(s)).  Blood Alcohol level:  Lab Results  Component Value Date   ETH <10 06/14/2021   ETH <10 04/13/2021    Metabolic Disorder Labs: Lab Results  Component Value Date   HGBA1C 5.3 06/29/2021   MPG 105.41 06/29/2021   MPG 108.28 03/25/2018   No results found for: PROLACTIN Lab Results  Component Value Date   CHOL 210 (H) 06/29/2021   TRIG 74 06/29/2021   HDL 60 06/29/2021   CHOLHDL 3.5 06/29/2021   VLDL 15 06/29/2021   LDLCALC 135 (H) 06/29/2021   LDLCALC 125 (H) 03/25/2018    Physical Findings: AIMS: Facial and Oral Movements Muscles of Facial Expression: None, normal Lips and Perioral Area: None, normal Jaw: None, normal Tongue: None, normal,Extremity Movements Upper (arms, wrists, hands, fingers): None, normal Lower (legs, knees, ankles, toes): None, normal, Trunk Movements Neck, shoulders, hips: None, normal, Overall Severity Severity of abnormal movements (highest score from questions above): None, normal Incapacitation due to abnormal movements: None, normal Patient's awareness of abnormal movements (rate only patient's report): No Awareness, Dental Status Current problems with teeth and/or dentures?: No Does patient usually wear dentures?: No  CIWA:    COWS:     Musculoskeletal: Strength & Muscle Tone: within normal limits Gait & Station: normal Patient leans: N/A  Psychiatric Specialty Exam:  Presentation  General Appearance: Casual; Neat  Eye Contact:Fleeting  Speech:Garbled  Speech Volume:Decreased  Handedness:Right   Mood and Affect  Mood:Anxious; Irritable  Affect:Constricted   Thought Process  Thought Processes:Disorganized; Irrevelant  Descriptions of Associations:Loose  Orientation:Partial  Thought Content:Paranoid Ideation; Illogical  History of Schizophrenia/Schizoaffective  disorder:Yes  Duration of Psychotic Symptoms:Greater than six months  Hallucinations:No data recorded Ideas of Reference:Paranoia  Suicidal Thoughts:No data recorded Homicidal Thoughts:No data recorded  Sensorium  Memory:Immediate Good  Judgment:Impaired  Insight:Poor   Executive Functions  Concentration:Poor  Attention Span:Poor  Recall:Fair  Fund of Knowledge:Fair  Language:Fair   Psychomotor Activity  Psychomotor Activity:No data recorded  Assets  Assets:Physical Health   Sleep  Sleep:No data recorded   Physical Exam: Physical Exam Vitals and nursing note reviewed.  Constitutional:      Appearance: Normal appearance.  HENT:     Head: Normocephalic and atraumatic.     Mouth/Throat:     Pharynx: Oropharynx is clear.  Eyes:     Pupils: Pupils are equal, round, and reactive to light.  Cardiovascular:     Rate and Rhythm: Normal rate and regular rhythm.  Pulmonary:     Effort: Pulmonary effort is normal.     Breath sounds: Normal breath sounds.  Abdominal:     General: Abdomen is flat.     Palpations: Abdomen is soft.  Musculoskeletal:        General: Normal range of motion.  Skin:    General: Skin is warm and dry.  Neurological:     General: No focal deficit present.     Mental Status: He is alert. Mental status is at baseline.  Psychiatric:        Attention and Perception: He is inattentive.        Mood and Affect: Mood normal. Affect is blunt.        Speech: Speech is delayed.        Behavior: Behavior is withdrawn.        Thought Content: Thought content is paranoid and delusional.        Cognition and Memory: Cognition is impaired. Memory is impaired.        Judgment: Judgment is inappropriate.   Review of Systems  Constitutional: Negative.   HENT: Negative.    Eyes: Negative.   Respiratory: Negative.    Cardiovascular: Negative.   Gastrointestinal: Negative.   Musculoskeletal: Negative.   Skin: Negative.   Neurological:  Negative.   Psychiatric/Behavioral: Negative.    Blood pressure 118/69, pulse 89, temperature 97.7 F (36.5 C), temperature source Oral, resp. rate 18, height 5\' 10"  (1.778 m), weight 55.8 kg, SpO2 100 %. Body mass index is 17.65 kg/m.   Treatment Plan Summary: Medication management and Plan really do not know what else we can do for this gentleman who is so determined to be noncompliant with medicines.  He has been given long-acting injectables and he is certainly better than when he first came in but continues with unacceptable behaviors.  On the other hand he is going to be going back to jail where he will have constant supervision again.  We may have reached the limit of what we can achieve in this hospitalization and might start looking at discharge planning within the next 1 to 2 days.  , MD 07/23/2021, 2:51 PM

## 2021-07-23 NOTE — Progress Notes (Signed)
Patient progressing. Less isolative and less preoccupied. He does pace the halls, but mostly prior to receiving evening  snack.He is med compliant and receives his medication without issues.He denies si/hi/avh at this encounter.  Will continue to monitor with q15 minute safety rounds.      Cleo Butler-Nicholson, LPN

## 2021-07-24 NOTE — Progress Notes (Signed)
Pt stayed in his room withdrawn except for meals. Pt was med compliant but would not answer questions. Pt has a sullen affect. Torrie Mayers RN

## 2021-07-24 NOTE — Plan of Care (Addendum)
Pt UTA.  Problem: Activity: Goal: Will verbalize the importance of balancing activity with adequate rest periods Outcome: Not Progressing   Problem: Education: Goal: Will be free of psychotic symptoms Outcome: Not Progressing Goal: Knowledge of the prescribed therapeutic regimen will improve Outcome: Not Progressing   Problem: Coping: Goal: Coping ability will improve Outcome: Not Progressing Goal: Will verbalize feelings Outcome: Not Progressing   Problem: Health Behavior/Discharge Planning: Goal: Compliance with prescribed medication regimen will improve Outcome: Not Progressing   Problem: Nutritional: Goal: Ability to achieve adequate nutritional intake will improve Outcome: Not Progressing   Problem: Role Relationship: Goal: Ability to communicate needs accurately will improve Outcome: Not Progressing Goal: Ability to interact with others will improve Outcome: Not Progressing   Problem: Safety: Goal: Ability to redirect hostility and anger into socially appropriate behaviors will improve Outcome: Not Progressing Goal: Ability to remain free from injury will improve Outcome: Not Progressing   Problem: Self-Care: Goal: Ability to participate in self-care as condition permits will improve Outcome: Not Progressing   Problem: Self-Concept: Goal: Will verbalize positive feelings about self Outcome: Not Progressing   Problem: Consults Goal: Concurrent Medical Patient Education Description: (See Patient Education Module for education specifics) Outcome: Not Progressing

## 2021-07-24 NOTE — Progress Notes (Signed)
Received medication without incident. Continues to be restless and pace the hall. Seems to increase pacing at snack and medication pass. He is quiet and reserved. Isolates to himself and seems internally preoccupied. Will engage appropriately when spoken to, and able to hold logical conversations.  Will continue to monitor with q 15 minute safety rounds.   Cleo Butler-Nicholson, LPN

## 2021-07-24 NOTE — Group Note (Signed)
[facility] LCSW Group Therapy Note ° ° °Group Date: 07/24/2021 °Start Time: 1300 °End Time: 1400 ° °Type of Therapy/Topic:  Group Therapy:  Feelings about Diagnosis ° °Participation Level:  Did Not Attend  ° °Mood: n/a  ° ° °Description of Group:   ° This group will allow patients to explore their thoughts and feelings about diagnoses they have received. Patients will be guided to explore their level of understanding and acceptance of these diagnoses. Facilitator will encourage patients to process their thoughts and feelings about the reactions of others to their diagnosis, and will guide patients in identifying ways to discuss their diagnosis with significant others in their lives. This group will be process-oriented, with patients participating in exploration of their own experiences as well as giving and receiving support and challenge from other group members. ° ° °Therapeutic Goals: °1. Patient will demonstrate understanding of diagnosis as evidence by identifying two or more symptoms of the disorder:  °2. Patient will be able to express two feelings regarding the diagnosis °3. Patient will demonstrate ability to communicate their needs through discussion and/or role plays ° °Summary of Patient Progress: °Patient did not attend group despite encouraged participation.   ° ° ° °Therapeutic Modalities:   °Cognitive Behavioral Therapy °Brief Therapy °Feelings Identification  ° ° °Lian Tanori W Irish Breisch, LCSWA °

## 2021-07-24 NOTE — Progress Notes (Signed)
[facility] MD Progress Note  07/24/2021 5:15 PM David Davila.  MRN:  [id number] Subjective: Follow-up for this 40 year old man with schizophrenia.  Patient shows little change today.  Continues to pick and choose about what medicines he will take.  Also he continues to only occasionally eat.  Paces around a little bit.  Not aggressive or threatening.  Takes care of his basic hygiene adequately.  Denies suicidal thoughts.  Denies hallucinations but frequently seems preoccupied Principal Problem: Schizophrenia, paranoid type (HCC) Diagnosis: Principal Problem:   Schizophrenia, paranoid type (HCC) Active Problems:   Noncompliance with medication regimen   TBI (traumatic brain injury)  Total Time spent with patient: 30 minutes  Past Psychiatric History: Past history of schizophrenia  Past Medical History:  Past Medical History:  Diagnosis Date   Acute psychosis (HCC)    Antisocial personality disorder (HCC)    Cannabis abuse    Past   Paranoid schizophrenia (HCC)    Schizoaffective disorder, bipolar type (HCC)    History reviewed. No pertinent surgical history. Family History: History reviewed. No pertinent family history. Family Psychiatric  History: See previous Social History:  Social History   Substance and Sexual Activity  Alcohol Use No     Social History   Substance and Sexual Activity  Drug Use Not Currently    Social History   Socioeconomic History   Marital status: Single    Spouse name: Not on file   Number of children: Not on file   Years of education: Not on file   Highest education level: Not on file  Occupational History   Not on file  Tobacco Use   Smoking status: Every Day    Packs/day: 1.00    Types: Cigarettes   Smokeless tobacco: Never  Vaping Use   Vaping Use: Unknown  Substance and Sexual Activity   Alcohol use: No   Drug use: Not Currently   Sexual activity: Not Currently  Other Topics Concern   Not on file  Social History  Narrative   Not on file   Social Determinants of Health   Financial Resource Strain: Not on file  Food Insecurity: Not on file  Transportation Needs: Not on file  Physical Activity: Not on file  Stress: Not on file  Social Connections: Not on file   Additional Social History:                         Sleep: Fair  Appetite:  Poor  Current Medications: Current Facility-Administered Medications  Medication Dose Route Frequency Provider Last Rate Last Admin   acetaminophen (TYLENOL) tablet 650 mg  650 mg Oral Q6H PRN Charm Rings, NP       alum & mag hydroxide-simeth (MAALOX/MYLANTA) 200-200-20 MG/5ML suspension 30 mL  30 mL Oral Q4H PRN Charm Rings, NP       atorvastatin (LIPITOR) tablet 10 mg  10 mg Oral q1800 Deal, Adonis Housekeeper, RPH   10 mg at 07/23/21 2119   benztropine (COGENTIN) tablet 1 mg  1 mg Oral BID Charm Rings, NP   1 mg at 07/24/21 1706   divalproex (DEPAKOTE) DR tablet 1,000 mg  1,000 mg Oral Earl Lites, Jamison Y, NP   1,000 mg at 07/24/21 1040   haloperidol (HALDOL) tablet 2 mg  2 mg Oral BID Math Brazie T, MD   2 mg at 07/24/21 1706   magnesium hydroxide (MILK OF MAGNESIA) suspension 30 mL  30 mL Oral  Daily PRN Charm Rings, NP       mirtazapine (REMERON) tablet 15 mg  15 mg Oral QHS Charm Rings, NP   15 mg at 07/23/21 2120   OLANZapine (ZYPREXA) injection 15 mg  15 mg Intramuscular BID PRN Carma Dwiggins, Jackquline Denmark, MD   15 mg at 07/20/21 1727   OLANZapine (ZYPREXA) tablet 15 mg  15 mg Oral BID Dimitria Ketchum, Jackquline Denmark, MD   15 mg at 07/24/21 1706   traZODone (DESYREL) tablet 100 mg  100 mg Oral QHS PRN Charm Rings, NP   100 mg at 07/20/21 2154    Lab Results: No results found for this or any previous visit (from the past 48 hour(s)).  Blood Alcohol level:  Lab Results  Component Value Date   ETH <10 06/14/2021   ETH <10 04/13/2021    Metabolic Disorder Labs: Lab Results  Component Value Date   HGBA1C 5.3 06/29/2021   MPG 105.41 06/29/2021    MPG 108.28 03/25/2018   No results found for: PROLACTIN Lab Results  Component Value Date   CHOL 210 (H) 06/29/2021   TRIG 74 06/29/2021   HDL 60 06/29/2021   CHOLHDL 3.5 06/29/2021   VLDL 15 06/29/2021   LDLCALC 135 (H) 06/29/2021   LDLCALC 125 (H) 03/25/2018    Physical Findings: AIMS: Facial and Oral Movements Muscles of Facial Expression: None, normal Lips and Perioral Area: None, normal Jaw: None, normal Tongue: None, normal,Extremity Movements Upper (arms, wrists, hands, fingers): None, normal Lower (legs, knees, ankles, toes): None, normal, Trunk Movements Neck, shoulders, hips: None, normal, Overall Severity Severity of abnormal movements (highest score from questions above): None, normal Incapacitation due to abnormal movements: None, normal Patient's awareness of abnormal movements (rate only patient's report): No Awareness, Dental Status Current problems with teeth and/or dentures?: No Does patient usually wear dentures?: No  CIWA:    COWS:     Musculoskeletal: Strength & Muscle Tone: within normal limits Gait & Station: normal Patient leans: N/A  Psychiatric Specialty Exam:  Presentation  General Appearance: Casual; Neat  Eye Contact:Fleeting  Speech:Garbled  Speech Volume:Decreased  Handedness:Right   Mood and Affect  Mood:Anxious; Irritable  Affect:Constricted   Thought Process  Thought Processes:Disorganized; Irrevelant  Descriptions of Associations:Loose  Orientation:Partial  Thought Content:Paranoid Ideation; Illogical  History of Schizophrenia/Schizoaffective disorder:Yes  Duration of Psychotic Symptoms:Greater than six months  Hallucinations:No data recorded Ideas of Reference:Paranoia  Suicidal Thoughts:No data recorded Homicidal Thoughts:No data recorded  Sensorium  Memory:Immediate Good  Judgment:Impaired  Insight:Poor   Executive Functions  Concentration:Poor  Attention Span:Poor  Recall:Fair  Fund of  Knowledge:Fair  Language:Fair   Psychomotor Activity  Psychomotor Activity:No data recorded  Assets  Assets:Physical Health   Sleep  Sleep:No data recorded   Physical Exam: Physical Exam Vitals and nursing note reviewed.  Constitutional:      Appearance: Normal appearance.  HENT:     Head: Normocephalic and atraumatic.     Mouth/Throat:     Pharynx: Oropharynx is clear.  Eyes:     Pupils: Pupils are equal, round, and reactive to light.  Cardiovascular:     Rate and Rhythm: Normal rate and regular rhythm.  Pulmonary:     Effort: Pulmonary effort is normal.     Breath sounds: Normal breath sounds.  Abdominal:     General: Abdomen is flat.     Palpations: Abdomen is soft.  Musculoskeletal:        General: Normal range of motion.  Skin:  General: Skin is warm and dry.  Neurological:     General: No focal deficit present.     Mental Status: He is alert. Mental status is at baseline.  Psychiatric:        Attention and Perception: He is inattentive.        Mood and Affect: Affect is blunt.        Speech: He is noncommunicative.        Behavior: Behavior is slowed.        Thought Content: Thought content is paranoid and delusional.   Review of Systems  Constitutional: Negative.   HENT: Negative.    Eyes: Negative.   Respiratory: Negative.    Cardiovascular: Negative.   Gastrointestinal: Negative.   Musculoskeletal: Negative.   Skin: Negative.   Neurological: Negative.   Psychiatric/Behavioral: Negative.    Blood pressure 118/69, pulse 89, temperature 97.7 F (36.5 C), temperature source Oral, resp. rate 18, height 5\' 10"  (1.778 m), weight 55.8 kg, SpO2 100 %. Body mass index is 17.65 kg/m.   Treatment Plan Summary: Medication management and Plan we have been putting off discharge planning daily I think because of concern about his long-term fate especially if he goes back into jail.  Nevertheless seems stable at this point.  We will talk about it tomorrow  and try and see if we can get him discharged this week.  , MD 07/24/2021, 5:15 PM

## 2021-07-24 NOTE — Progress Notes (Signed)
Recreation Therapy Notes   Date: 07/24/2021  Time: 10:15 am   Location:  Craft room    Behavioral response: N/A   Intervention Topic: Self-care      Discussion/Intervention: Patient did not attend group.  Clinical Observations/Feedback:  Patient did not attend group.   Nelva Hauk LRT/CTRS        Aletha Allebach 07/24/2021 12:09 PM

## 2021-07-25 MED ORDER — MIRTAZAPINE 15 MG PO TABS
15.0000 mg | ORAL_TABLET | Freq: Every day | ORAL | 1 refills | Status: AC
Start: 2021-07-25 — End: 2021-08-24

## 2021-07-25 MED ORDER — INVEGA SUSTENNA 156 MG/ML IM SUSY: 156.0000 mg | PREFILLED_SYRINGE | Freq: Once | INTRAMUSCULAR | 2 refills | Status: AC

## 2021-07-25 MED ORDER — HALOPERIDOL 2 MG PO TABS: 2.0000 mg | ORAL_TABLET | Freq: Two times a day (BID) | ORAL | 1 refills | Status: AC

## 2021-07-25 MED ORDER — ATORVASTATIN CALCIUM 10 MG PO TABS
10.0000 mg | ORAL_TABLET | Freq: Every day | ORAL | 1 refills | Status: AC
Start: 2021-07-25 — End: 2021-08-24

## 2021-07-25 MED ORDER — DIVALPROEX SODIUM 500 MG PO DR TAB: 1000.0000 mg | DELAYED_RELEASE_TABLET | ORAL | 1 refills | Status: AC

## 2021-07-25 MED ORDER — OLANZAPINE 15 MG PO TABS: 15.0000 mg | ORAL_TABLET | Freq: Two times a day (BID) | ORAL | 1 refills | Status: AC

## 2021-07-25 MED ORDER — BENZTROPINE MESYLATE 1 MG PO TABS: 1.0000 mg | ORAL_TABLET | Freq: Two times a day (BID) | ORAL | 1 refills | Status: AC

## 2021-07-25 NOTE — Progress Notes (Signed)
Pt was educated on dc plan and verbalizes understanding. Pt denies SI, HI and AVH. Pt received AVS, transition record, belongings and prescriptions. Torrie Mayers RN

## 2021-07-25 NOTE — [facility] Counselor (Signed)
CSW spoke with Frederica Kuster, [phone].  She reports that she will need to speak with her captain before scheduling pick up.  She reports that she will return this CSW's call.  Penni Homans, MSW, LCSW 07/25/2021 10:14 AM

## 2021-07-25 NOTE — Progress Notes (Signed)
Patient was medication compliant, he had minimal interaction with peers and staff. No nee behavioral issues to report on shift.

## 2021-07-25 NOTE — [facility] Suicide Risk Assessment (Signed)
[facility] Discharge Suicide Risk Assessment   Principal Problem: Schizophrenia, paranoid type (HCC) Discharge Diagnoses: Principal Problem:   Schizophrenia, paranoid type (HCC) Active Problems:   Noncompliance with medication regimen   TBI (traumatic brain injury)   Total Time spent with patient: 45 minutes  Musculoskeletal: Strength & Muscle Tone: within normal limits Gait & Station: normal Patient leans: N/A  Psychiatric Specialty Exam  Presentation  General Appearance: Casual; Neat  Eye Contact:Fleeting  Speech:Garbled  Speech Volume:Decreased  Handedness:Right   Mood and Affect  Mood:Anxious; Irritable  Duration of Depression Symptoms: No data recorded Affect:Constricted   Thought Process  Thought Processes:Disorganized; Irrevelant  Descriptions of Associations:Loose  Orientation:Partial  Thought Content:Paranoid Ideation; Illogical  History of Schizophrenia/Schizoaffective disorder:Yes  Duration of Psychotic Symptoms:Greater than six months  Hallucinations:No data recorded Ideas of Reference:Paranoia  Suicidal Thoughts:No data recorded Homicidal Thoughts:No data recorded  Sensorium  Memory:Immediate Good  Judgment:Impaired  Insight:Poor   Executive Functions  Concentration:Poor  Attention Span:Poor  Recall:Fair  Fund of Knowledge:Fair  Language:Fair   Psychomotor Activity  Psychomotor Activity:No data recorded  Assets  Assets:Physical Health   Sleep  Sleep:No data recorded  Physical Exam: Physical Exam Vitals and nursing note reviewed.  Constitutional:      Appearance: Normal appearance.  HENT:     Head: Normocephalic and atraumatic.     Mouth/Throat:     Pharynx: Oropharynx is clear.  Eyes:     Pupils: Pupils are equal, round, and reactive to light.  Cardiovascular:     Rate and Rhythm: Normal rate and regular rhythm.  Pulmonary:     Effort: Pulmonary effort is normal.     Breath sounds: Normal breath sounds.   Abdominal:     General: Abdomen is flat.     Palpations: Abdomen is soft.  Musculoskeletal:        General: Normal range of motion.  Skin:    General: Skin is warm and dry.  Neurological:     General: No focal deficit present.     Mental Status: He is alert. Mental status is at baseline.  Psychiatric:        Attention and Perception: He is inattentive.        Mood and Affect: Mood normal. Affect is blunt.        Speech: Speech is delayed.        Behavior: Behavior is slowed.        Thought Content: Thought content is paranoid. Thought content does not include homicidal or suicidal ideation.        Cognition and Memory: Cognition is impaired.        Judgment: Judgment is inappropriate.   Review of Systems  Constitutional: Negative.   HENT: Negative.    Eyes: Negative.   Respiratory: Negative.    Cardiovascular: Negative.   Gastrointestinal: Negative.   Musculoskeletal: Negative.   Skin: Negative.   Neurological: Negative.   Psychiatric/Behavioral: Negative.    Blood pressure 118/69, pulse 89, temperature 97.7 F (36.5 C), temperature source Oral, resp. rate 18, height 5\' 10"  (1.778 m), weight 55.8 kg, SpO2 100 %. Body mass index is 17.65 kg/m.  Mental Status Per Nursing Assessment::   On Admission:  NA  Demographic Factors:  Male and Low socioeconomic status  Loss Factors: Incarceration and loss of safe living space  Historical Factors: Impulsivity  Risk Reduction Factors:   Living with another person, especially a relative  Continued Clinical Symptoms:  Schizophrenia:   Depressive state  Cognitive Features That Contribute To  Risk:  Closed-mindedness and Thought constriction (tunnel vision)    Suicide Risk:  Mild:  Suicidal ideation of limited frequency, intensity, duration, and specificity.  There are no identifiable plans, no associated intent, mild dysphoria and related symptoms, good self-control (both objective and subjective assessment), few other risk  factors, and identifiable protective factors, including available and accessible social support.    Plan Of Care/Follow-up recommendations:  Patient is going to be discharged today back to the custody of the jail system as legally ordered.  He is aware of the plan.  He denies any intention to harm himself and has not shown any active suicidal behavior or intention to cause himself direct harm.  He understands the discharge plan.  He will be in a supervised setting at discharge  Mordecai Rasmussen, MD 07/25/2021, 10:58 AM

## 2021-07-25 NOTE — Progress Notes (Signed)
Recreation Therapy Notes  INPATIENT RECREATION TR PLAN  Patient Details Name: David Davila. MRN: [id number] DOB: March 18, 1981 Today's Date: 07/25/2021  Rec Therapy Plan Is patient appropriate for Therapeutic Recreation?: Yes Treatment times per week: at least 3 Estimated Length of Stay: 5-7 days TR Treatment/Interventions: Group participation (Comment)  Discharge Criteria Pt will be discharged from therapy if:: Discharged Treatment plan/goals/alternatives discussed and agreed upon by:: Patient/family  Discharge Summary Short term goals set: Patient will engage in groups without prompting or encouragement from LRT x3 group sessions within 5 recreation therapy group sessions Short term goals met: Adequate for discharge Progress toward goals comments: Groups attended Which groups?: Other (Comment), Goal setting (Problem Solving, Relaxation) Reason goals not met: N/A Therapeutic equipment acquired: N/A Reason patient discharged from therapy: Discharge from hospital Pt/family agrees with progress & goals achieved: Yes Date patient discharged from therapy: 07/25/21   Olliver Boyadjian 07/25/2021, 11:27 AM

## 2021-07-25 NOTE — Plan of Care (Signed)
°  Problem: Group Participation Goal: STG - Patient will engage in groups without prompting or encouragement from LRT x3 group sessions within 5 recreation therapy group sessions Description: STG - Patient will engage in groups without prompting or encouragement from LRT x3 group sessions within 5 recreation therapy group sessions 07/25/2021 1126 by Alveria Apley, LRT Outcome: Adequate for Discharge 07/25/2021 1125 by Alveria Apley, LRT Outcome: Adequate for Discharge

## 2021-07-25 NOTE — Progress Notes (Signed)
°  [facility] Adult Case Management Discharge Plan :  Will you be returning to the same living situation after discharge:  No.  Patient has a court order to return to police custody. At discharge, do you have transportation home?: Yes,  Pt has a court order to be transported back to the custody of the [facility].  Do you have the ability to pay for your medications: No.  Release of information consent forms completed and in the chart;  Patient's signature needed at discharge.  Patient to Follow up at:  Follow-up Information     [facility] Follow up.   Why: An appointment was NOT scheduled, however, due to current situation, however, should that change you can call and schedule an appointment. Contact information: [address] [place] [address] [phone]                 Next level of care provider has access to [facility] Link:no  Safety Planning and Suicide Prevention discussed: Yes,  SPE completed with the patient's guardian.      Has patient been referred to the Quitline?: Patient refused referral  Patient has been referred for addiction treatment: Pt. refused referral  Harden Mo, LCSW 07/25/2021, 11:19 AM

## 2021-07-25 NOTE — Progress Notes (Signed)
Recreation Therapy Notes   Date: 07/25/2021  Time: 10:15am   Location:  Craft room    Behavioral response: N/A   Intervention Topic: Values   Discussion/Intervention: Patient did not attend group.  Clinical Observations/Feedback:  Patient did not attend group.   Deah Ottaway LRT/CTRS          Kodee Drury 07/25/2021 11:19 AM

## 2021-07-25 NOTE — Discharge Summary (Signed)
Physician Discharge Summary Note  Patient:  David Davila. is an 40 y.o., male MRN:  [id number] DOB:  03-23-1981 Patient phone:  [phone] (home)  Patient address:   [address] [place] [address],  Total Time spent with patient: 45 minutes  Date of Admission:  06/22/2021 Date of Discharge: 07/25/2021  Reason for Admission: Patient was brought to our emergency room under involuntary commitment and court order from the court house and the jail system.  Somewhat unusual legal situation.  Apparently he is still obliged to be under custody of the criminal justice system but they did not keep him under a level of custody that would require having a staff person here in the hospital.  There was some mention of being incapable to proceed and possibly needing an assessment.  Our staff advised them that we would not be able to perform that kind of assessment here but apparently they prefer not to send him to [facility].  Continued to have psychotic symptoms disorganized thinking and behavior  Principal Problem: Schizophrenia, paranoid type [facility]) Discharge Diagnoses: Principal Problem:   Schizophrenia, paranoid type (HCC) Active Problems:   Noncompliance with medication regimen   TBI (traumatic brain injury)   Past Psychiatric History: Patient has a long history of chronic schizophrenia.  At times has shown some improvement on medication but is frequently noncompliant and has had frequent hospitalizations emergency room visits and jail visits because of chaotic behavior.  He has no family active involvement.  No known history of suicide attempts.  Patient is also known to have a past history of traumatic brain injury  Past Medical History:  Past Medical History:  Diagnosis Date   Acute psychosis (HCC)    Antisocial personality disorder (HCC)    Cannabis abuse    Past   Paranoid schizophrenia (HCC)    Schizoaffective disorder, bipolar type (HCC)    History  reviewed. No pertinent surgical history. Family History: History reviewed. No pertinent family history. Family Psychiatric  History: Unknown Social History:  Social History   Substance and Sexual Activity  Alcohol Use No     Social History   Substance and Sexual Activity  Drug Use Not Currently    Social History   Socioeconomic History   Marital status: Single    Spouse name: Not on file   Number of children: Not on file   Years of education: Not on file   Highest education level: Not on file  Occupational History   Not on file  Tobacco Use   Smoking status: Every Day    Packs/day: 1.00    Types: Cigarettes   Smokeless tobacco: Never  Vaping Use   Vaping Use: Unknown  Substance and Sexual Activity   Alcohol use: No   Drug use: Not Currently   Sexual activity: Not Currently  Other Topics Concern   Not on file  Social History Narrative   Not on file   Social Determinants of Health   Financial Resource Strain: Not on file  Food Insecurity: Not on file  Transportation Needs: Not on file  Physical Activity: Not on file  Stress: Not on file  Social Connections: Not on file    Hospital Course: Admitted to the psychiatric unit.  15-minute checks maintained.  Did not attempt to harm himself or display any aggressive behavior.  Every effort was made to include him in therapeutic groups and to pursue appropriate medication management.  Patient remained mostly withdrawn during his hospital stay often  staying isolated to his room.  He had some intermittent periods of noncompliance requiring forced medicine orders for long-acting antipsychotics in particular.  Despite this he continued to display frequent paranoid and confused thinking.  At times he would go for long stretches without eating claiming that his religion told him that it was wrong to eat any food of any sort.  Nothing logical would convince him otherwise.  At this point the patient appears to have reached a level of  improvement that is practical in a short term psychiatric unit such as ours.  I think he might benefit if it were available from a much longer stay on a therapeutic unit where other medication trials might be pursued but at this point I do not think there is any need to keep him on our psychiatric unit.  It seems inevitable that he will go back to jail.  Patient is aware of that and expresses complete indifference to it.  He will be discharged on his current medications which include both Invega and Haldol as well as mood stabilizers.  He has received extensive counseling about the crucial need to eat and take care of his health.  Staff has been in touch with the sheriff and the jail system around discharge planning  Physical Findings: AIMS: Facial and Oral Movements Muscles of Facial Expression: None, normal Lips and Perioral Area: None, normal Jaw: None, normal Tongue: None, normal,Extremity Movements Upper (arms, wrists, hands, fingers): None, normal Lower (legs, knees, ankles, toes): None, normal, Trunk Movements Neck, shoulders, hips: None, normal, Overall Severity Severity of abnormal movements (highest score from questions above): None, normal Incapacitation due to abnormal movements: None, normal Patient's awareness of abnormal movements (rate only patient's report): No Awareness, Dental Status Current problems with teeth and/or dentures?: No Does patient usually wear dentures?: No  CIWA:    COWS:     Musculoskeletal: Strength & Muscle Tone: within normal limits Gait & Station: normal Patient leans: N/A   Psychiatric Specialty Exam:  Presentation  General Appearance: Casual; Neat  Eye Contact:Fleeting  Speech:Garbled  Speech Volume:Decreased  Handedness:Right   Mood and Affect  Mood:Anxious; Irritable  Affect:Constricted   Thought Process  Thought Processes:Disorganized; Irrevelant  Descriptions of Associations:Loose  Orientation:Partial  Thought  Content:Paranoid Ideation; Illogical  History of Schizophrenia/Schizoaffective disorder:Yes  Duration of Psychotic Symptoms:Greater than six months  Hallucinations:No data recorded Ideas of Reference:Paranoia  Suicidal Thoughts:No data recorded Homicidal Thoughts:No data recorded  Sensorium  Memory:Immediate Good  Judgment:Impaired  Insight:Poor   Executive Functions  Concentration:Poor  Attention Span:Poor  Recall:Fair  Fund of Knowledge:Fair  Language:Fair   Psychomotor Activity  Psychomotor Activity:No data recorded  Assets  Assets:Physical Health   Sleep  Sleep:No data recorded   Physical Exam: Physical Exam Vitals and nursing note reviewed.  Constitutional:      Appearance: Normal appearance.  HENT:     Head: Normocephalic and atraumatic.     Mouth/Throat:     Pharynx: Oropharynx is clear.  Eyes:     Pupils: Pupils are equal, round, and reactive to light.  Cardiovascular:     Rate and Rhythm: Normal rate and regular rhythm.  Pulmonary:     Effort: Pulmonary effort is normal.     Breath sounds: Normal breath sounds.  Abdominal:     General: Abdomen is flat.     Palpations: Abdomen is soft.  Musculoskeletal:        General: Normal range of motion.  Skin:    General: Skin is warm and  dry.  Neurological:     General: No focal deficit present.     Mental Status: He is alert. Mental status is at baseline.  Psychiatric:        Attention and Perception: He is inattentive.        Mood and Affect: Mood normal. Affect is blunt.        Speech: Speech is delayed.        Behavior: Behavior is slowed.        Thought Content: Thought content is paranoid. Thought content does not include homicidal or suicidal ideation.        Cognition and Memory: Cognition is impaired. Memory is impaired.        Judgment: Judgment is inappropriate.   Review of Systems  Constitutional: Negative.   HENT: Negative.    Eyes: Negative.   Respiratory: Negative.     Cardiovascular: Negative.   Gastrointestinal: Negative.   Musculoskeletal: Negative.   Skin: Negative.   Neurological: Negative.   Psychiatric/Behavioral: Negative.    Blood pressure 118/69, pulse 89, temperature 97.7 F (36.5 C), temperature source Oral, resp. rate 18, height  (1.778 m), weight 55.8 kg, SpO2 100 %. Body mass index is 17.65 kg/m.   Social History   Tobacco Use  Smoking Status Every Day   Packs/day: 1.00   Types: Cigarettes  Smokeless Tobacco Never   Tobacco Cessation:  A prescription for an FDA-approved tobacco cessation medication was offered at discharge and the patient refused   Blood Alcohol level:  Lab Results  Component Value Date   [facility] <10 06/14/2021   ETH <10 04/13/2021    Metabolic Disorder Labs:  Lab Results  Component Value Date   HGBA1C 5.3 06/29/2021   MPG 105.41 06/29/2021   MPG 108.28 03/25/2018   No results found for: PROLACTIN Lab Results  Component Value Date   CHOL 210 (H) 06/29/2021   TRIG 74 06/29/2021   HDL 60 06/29/2021   CHOLHDL 3.5 06/29/2021   VLDL 15 06/29/2021   LDLCALC 135 (H) 06/29/2021   LDLCALC 125 (H) 03/25/2018    See Psychiatric Specialty Exam and Suicide Risk Assessment completed by Attending Physician prior to discharge.  Discharge destination: Returning to the jail system under share of custody  Is patient on multiple antipsychotic therapies at discharge:  No   Has Patient had three or more failed trials of antipsychotic monotherapy by history:  Yes,   Antipsychotic medications that previously failed include:   1.  Invega., 2.  Haldol., and 3.  Olanzapine.  Recommended Plan for Multiple Antipsychotic Therapies: Lack of response to single agent  Discharge Instructions     Diet - low sodium heart healthy   Complete by: As directed    Increase activity slowly   Complete by: As directed       Allergies as of 07/25/2021       Reactions   Invega Sustenna [paliperidone Palmitate Er] Other  (See Comments)   Caused drop in WBC and neutropenia following injection in August 2019 (he was not on other medications that could have caused this drop). Has tolerated it in the past (with no drop in WBC). Repeat CBC a few days later in August then showed improved ANC and WBC. Would recommend monitoring CBC while on this medication   Zyprexa [olanzapine] Other (See Comments)   Reaction: unknown        Medication List     STOP taking these medications    paliperidone 6 MG 24  hr tablet Commonly known as: INVEGA   traZODone 100 MG tablet Commonly known as: DESYREL       TAKE these medications      Indication  atorvastatin 10 MG tablet Commonly known as: LIPITOR Take 1 tablet (10 mg total) by mouth daily at 6 PM.  Indication: High Amount of Fats in the Blood   benztropine 1 MG tablet Commonly known as: COGENTIN Take 1 tablet (1 mg total) by mouth 2 (two) times daily.  Indication: Extrapyramidal Reaction caused by Medications   divalproex 500 MG DR tablet Commonly known as: DEPAKOTE Take 2 tablets (1,000 mg total) by mouth every morning.  Indication: Schizophrenia   haloperidol 2 MG tablet Commonly known as: HALDOL Take 1 tablet (2 mg total) by mouth 2 (two) times daily. What changed:  medication strength how much to take when to take this  Indication: Psychosis   Invega Sustenna 156 MG/ML Susy injection Generic drug: paliperidone Inject 1 mL (156 mg total) into the muscle once for 1 dose. Start taking on: August 22, 2021 What changed:  These instructions start on August 22, 2021. If you are unsure what to do until then, ask your doctor or other care provider. Another medication with the same name was removed. Continue taking this medication, and follow the directions you see here.  Indication: Schizophrenia   mirtazapine 15 MG tablet Commonly known as: REMERON Take 1 tablet (15 mg total) by mouth at bedtime.  Indication: Major Depressive Disorder    OLANZapine 15 MG tablet Commonly known as: ZYPREXA Take 1 tablet (15 mg total) by mouth 2 (two) times daily. What changed:  medication strength how much to take  Indication: Major Depressive Disorder, Schizophrenia         Follow-up recommendations: Follow-up with medical and psychiatric treatment currently through the jail system.  Guardian is aware of the plan and will arrange for further treatment if and when he is discharged from the criminal Justice system  Comments: Prescriptions provided  Signed: Mordecai Rasmussen, MD 07/25/2021, 11:06 AM

## 2021-07-25 NOTE — [facility] Counselor (Signed)
CSW spoke with the patient's guardian, [employer] [facility] [place]) [phone].  CSW informed that the patient was taking his medications and while CSW is unsure if the patient ate breakfast this morning, the patient has struggled with eating.  CSW also noted that the patient remains in the same clothing, though has been observed with towels and hygiene products, so patient may be showering and placing the same clothing on or washing his clothing without CSW being aware.   CSW informed that she will reach out to transportation and then follow up with the guardian.  Penni Homans, MSW, LCSW 07/25/2021 9:52 AM
